# Patient Record
Sex: Female | Born: 1965 | Race: Black or African American | Hispanic: No | Marital: Single | Smoking: Former smoker
Health system: Southern US, Community
[De-identification: ages and names within clinical notes are randomized; demographics above are authoritative.]

## PROBLEM LIST (undated history)

## (undated) DIAGNOSIS — M5126 Other intervertebral disc displacement, lumbar region: Secondary | ICD-10-CM

## (undated) DIAGNOSIS — M1990 Unspecified osteoarthritis, unspecified site: Secondary | ICD-10-CM

## (undated) DIAGNOSIS — I10 Essential (primary) hypertension: Secondary | ICD-10-CM

## (undated) DIAGNOSIS — J302 Other seasonal allergic rhinitis: Secondary | ICD-10-CM

## (undated) DIAGNOSIS — G709 Myoneural disorder, unspecified: Secondary | ICD-10-CM

## (undated) DIAGNOSIS — M5136 Other intervertebral disc degeneration, lumbar region: Secondary | ICD-10-CM

## (undated) DIAGNOSIS — J4520 Mild intermittent asthma, uncomplicated: Secondary | ICD-10-CM

## (undated) DIAGNOSIS — R569 Unspecified convulsions: Secondary | ICD-10-CM

## (undated) DIAGNOSIS — D219 Benign neoplasm of connective and other soft tissue, unspecified: Secondary | ICD-10-CM

## (undated) DIAGNOSIS — M51369 Other intervertebral disc degeneration, lumbar region without mention of lumbar back pain or lower extremity pain: Secondary | ICD-10-CM

## (undated) DIAGNOSIS — R232 Flushing: Secondary | ICD-10-CM

## (undated) DIAGNOSIS — N63 Unspecified lump in unspecified breast: Secondary | ICD-10-CM

## (undated) DIAGNOSIS — N926 Irregular menstruation, unspecified: Secondary | ICD-10-CM

## (undated) DIAGNOSIS — N840 Polyp of corpus uteri: Secondary | ICD-10-CM

## (undated) DIAGNOSIS — F32A Depression, unspecified: Secondary | ICD-10-CM

## (undated) DIAGNOSIS — T7840XA Allergy, unspecified, initial encounter: Secondary | ICD-10-CM

## (undated) HISTORY — DX: Mild intermittent asthma, uncomplicated: J45.20

## (undated) HISTORY — DX: Allergy, unspecified, initial encounter: T78.40XA

## (undated) HISTORY — DX: Irregular menstruation, unspecified: N92.6

## (undated) HISTORY — DX: Unspecified lump in unspecified breast: N63.0

## (undated) HISTORY — PX: GANGLION CYST EXCISION: SHX1691

## (undated) HISTORY — PX: TUBAL LIGATION: SHX77

## (undated) HISTORY — DX: Flushing: R23.2

## (undated) HISTORY — PX: BREAST BIOPSY: SHX20

## (undated) HISTORY — PX: TRIGGER FINGER RELEASE: SHX641

## (undated) HISTORY — DX: Myoneural disorder, unspecified: G70.9

## (undated) HISTORY — DX: Polyp of corpus uteri: N84.0

## (undated) HISTORY — DX: Benign neoplasm of connective and other soft tissue, unspecified: D21.9

## (undated) HISTORY — PX: ABLATION: SHX5711

## (undated) HISTORY — DX: Depression, unspecified: F32.A

---

## 2001-01-10 ENCOUNTER — Ambulatory Visit (HOSPITAL_COMMUNITY): Admission: RE | Admit: 2001-01-10 | Discharge: 2001-01-10 | Payer: Self-pay | Admitting: Obstetrics and Gynecology

## 2001-01-22 ENCOUNTER — Other Ambulatory Visit: Admission: RE | Admit: 2001-01-22 | Discharge: 2001-01-22 | Payer: Self-pay | Admitting: Obstetrics and Gynecology

## 2001-02-02 ENCOUNTER — Emergency Department (HOSPITAL_COMMUNITY): Admission: EM | Admit: 2001-02-02 | Discharge: 2001-02-02 | Payer: Self-pay | Admitting: Emergency Medicine

## 2001-03-08 ENCOUNTER — Emergency Department (HOSPITAL_COMMUNITY): Admission: EM | Admit: 2001-03-08 | Discharge: 2001-03-08 | Payer: Self-pay | Admitting: Emergency Medicine

## 2001-06-11 ENCOUNTER — Encounter: Payer: Self-pay | Admitting: Emergency Medicine

## 2001-06-11 ENCOUNTER — Emergency Department (HOSPITAL_COMMUNITY): Admission: EM | Admit: 2001-06-11 | Discharge: 2001-06-11 | Payer: Self-pay | Admitting: Emergency Medicine

## 2001-11-14 ENCOUNTER — Ambulatory Visit (HOSPITAL_COMMUNITY): Admission: AD | Admit: 2001-11-14 | Discharge: 2001-11-14 | Payer: Self-pay | Admitting: Obstetrics and Gynecology

## 2001-11-29 ENCOUNTER — Ambulatory Visit (HOSPITAL_COMMUNITY): Admission: AD | Admit: 2001-11-29 | Discharge: 2001-11-29 | Payer: Self-pay | Admitting: Obstetrics and Gynecology

## 2001-12-10 ENCOUNTER — Ambulatory Visit (HOSPITAL_COMMUNITY): Admission: AD | Admit: 2001-12-10 | Discharge: 2001-12-10 | Payer: Self-pay | Admitting: Obstetrics and Gynecology

## 2001-12-16 ENCOUNTER — Ambulatory Visit (HOSPITAL_COMMUNITY): Admission: AD | Admit: 2001-12-16 | Discharge: 2001-12-16 | Payer: Self-pay | Admitting: Obstetrics and Gynecology

## 2001-12-19 ENCOUNTER — Ambulatory Visit (HOSPITAL_COMMUNITY): Admission: AD | Admit: 2001-12-19 | Discharge: 2001-12-19 | Payer: Self-pay | Admitting: Obstetrics and Gynecology

## 2001-12-23 ENCOUNTER — Ambulatory Visit (HOSPITAL_COMMUNITY): Admission: RE | Admit: 2001-12-23 | Discharge: 2001-12-23 | Payer: Self-pay | Admitting: Obstetrics and Gynecology

## 2001-12-26 ENCOUNTER — Ambulatory Visit (HOSPITAL_COMMUNITY): Admission: RE | Admit: 2001-12-26 | Discharge: 2001-12-26 | Payer: Self-pay | Admitting: Obstetrics and Gynecology

## 2001-12-30 ENCOUNTER — Ambulatory Visit (HOSPITAL_COMMUNITY): Admission: AD | Admit: 2001-12-30 | Discharge: 2001-12-30 | Payer: Self-pay | Admitting: Obstetrics and Gynecology

## 2002-01-02 ENCOUNTER — Ambulatory Visit (HOSPITAL_COMMUNITY): Admission: AD | Admit: 2002-01-02 | Discharge: 2002-01-02 | Payer: Self-pay | Admitting: Obstetrics and Gynecology

## 2002-01-06 ENCOUNTER — Ambulatory Visit (HOSPITAL_COMMUNITY): Admission: AD | Admit: 2002-01-06 | Discharge: 2002-01-06 | Payer: Self-pay | Admitting: Obstetrics and Gynecology

## 2002-01-09 ENCOUNTER — Ambulatory Visit (HOSPITAL_COMMUNITY): Admission: RE | Admit: 2002-01-09 | Discharge: 2002-01-09 | Payer: Self-pay | Admitting: Obstetrics and Gynecology

## 2002-01-10 ENCOUNTER — Inpatient Hospital Stay (HOSPITAL_COMMUNITY): Admission: RE | Admit: 2002-01-10 | Discharge: 2002-01-13 | Payer: Self-pay | Admitting: Obstetrics and Gynecology

## 2003-09-07 ENCOUNTER — Ambulatory Visit (HOSPITAL_COMMUNITY): Admission: RE | Admit: 2003-09-07 | Discharge: 2003-09-07 | Payer: Self-pay | Admitting: Family Medicine

## 2004-02-02 ENCOUNTER — Emergency Department (HOSPITAL_COMMUNITY): Admission: EM | Admit: 2004-02-02 | Discharge: 2004-02-02 | Payer: Self-pay | Admitting: Emergency Medicine

## 2004-06-19 ENCOUNTER — Emergency Department (HOSPITAL_COMMUNITY): Admission: EM | Admit: 2004-06-19 | Discharge: 2004-06-19 | Payer: Self-pay | Admitting: Emergency Medicine

## 2005-01-31 ENCOUNTER — Emergency Department (HOSPITAL_COMMUNITY): Admission: EM | Admit: 2005-01-31 | Discharge: 2005-01-31 | Payer: Self-pay | Admitting: Emergency Medicine

## 2005-02-11 ENCOUNTER — Emergency Department (HOSPITAL_COMMUNITY): Admission: EM | Admit: 2005-02-11 | Discharge: 2005-02-11 | Payer: Self-pay | Admitting: Emergency Medicine

## 2005-09-30 ENCOUNTER — Emergency Department (HOSPITAL_COMMUNITY): Admission: EM | Admit: 2005-09-30 | Discharge: 2005-09-30 | Payer: Self-pay | Admitting: Emergency Medicine

## 2005-10-05 ENCOUNTER — Ambulatory Visit (HOSPITAL_COMMUNITY): Admission: RE | Admit: 2005-10-05 | Discharge: 2005-10-05 | Payer: Self-pay | Admitting: Family Medicine

## 2006-01-04 ENCOUNTER — Emergency Department (HOSPITAL_COMMUNITY): Admission: EM | Admit: 2006-01-04 | Discharge: 2006-01-04 | Payer: Self-pay | Admitting: Emergency Medicine

## 2006-02-15 ENCOUNTER — Emergency Department (HOSPITAL_COMMUNITY): Admission: EM | Admit: 2006-02-15 | Discharge: 2006-02-15 | Payer: Self-pay | Admitting: Emergency Medicine

## 2006-02-19 ENCOUNTER — Ambulatory Visit (HOSPITAL_COMMUNITY): Admission: RE | Admit: 2006-02-19 | Discharge: 2006-02-19 | Payer: Self-pay | Admitting: Internal Medicine

## 2006-08-16 ENCOUNTER — Emergency Department (HOSPITAL_COMMUNITY): Admission: EM | Admit: 2006-08-16 | Discharge: 2006-08-16 | Payer: Self-pay | Admitting: Emergency Medicine

## 2007-02-07 ENCOUNTER — Emergency Department (HOSPITAL_COMMUNITY): Admission: EM | Admit: 2007-02-07 | Discharge: 2007-02-07 | Payer: Self-pay | Admitting: Emergency Medicine

## 2007-02-25 ENCOUNTER — Emergency Department (HOSPITAL_COMMUNITY): Admission: EM | Admit: 2007-02-25 | Discharge: 2007-02-25 | Payer: Self-pay | Admitting: Emergency Medicine

## 2007-03-27 ENCOUNTER — Emergency Department (HOSPITAL_COMMUNITY): Admission: EM | Admit: 2007-03-27 | Discharge: 2007-03-27 | Payer: Self-pay | Admitting: Emergency Medicine

## 2007-05-29 ENCOUNTER — Ambulatory Visit (HOSPITAL_COMMUNITY): Admission: RE | Admit: 2007-05-29 | Discharge: 2007-05-29 | Payer: Self-pay | Admitting: Internal Medicine

## 2007-06-11 ENCOUNTER — Emergency Department (HOSPITAL_COMMUNITY): Admission: EM | Admit: 2007-06-11 | Discharge: 2007-06-11 | Payer: Self-pay | Admitting: Emergency Medicine

## 2007-07-02 ENCOUNTER — Encounter (HOSPITAL_COMMUNITY): Admission: RE | Admit: 2007-07-02 | Discharge: 2007-08-01 | Payer: Self-pay | Admitting: Orthopaedic Surgery

## 2007-08-02 ENCOUNTER — Encounter (HOSPITAL_COMMUNITY): Admission: RE | Admit: 2007-08-02 | Discharge: 2007-09-01 | Payer: Self-pay | Admitting: Orthopaedic Surgery

## 2007-09-02 ENCOUNTER — Emergency Department (HOSPITAL_COMMUNITY): Admission: EM | Admit: 2007-09-02 | Discharge: 2007-09-02 | Payer: Self-pay | Admitting: Emergency Medicine

## 2007-09-25 ENCOUNTER — Emergency Department (HOSPITAL_COMMUNITY): Admission: EM | Admit: 2007-09-25 | Discharge: 2007-09-25 | Payer: Self-pay | Admitting: Emergency Medicine

## 2007-10-07 ENCOUNTER — Ambulatory Visit (HOSPITAL_COMMUNITY): Admission: RE | Admit: 2007-10-07 | Discharge: 2007-10-07 | Payer: Self-pay | Admitting: Internal Medicine

## 2008-01-16 ENCOUNTER — Emergency Department (HOSPITAL_COMMUNITY): Admission: EM | Admit: 2008-01-16 | Discharge: 2008-01-16 | Payer: Self-pay | Admitting: Emergency Medicine

## 2008-01-21 ENCOUNTER — Encounter (HOSPITAL_COMMUNITY): Admission: RE | Admit: 2008-01-21 | Discharge: 2008-02-07 | Payer: Self-pay | Admitting: Orthopaedic Surgery

## 2008-04-13 ENCOUNTER — Ambulatory Visit (HOSPITAL_COMMUNITY): Admission: RE | Admit: 2008-04-13 | Discharge: 2008-04-13 | Payer: Self-pay | Admitting: Internal Medicine

## 2008-10-20 ENCOUNTER — Ambulatory Visit (HOSPITAL_COMMUNITY): Admission: RE | Admit: 2008-10-20 | Discharge: 2008-10-20 | Payer: Self-pay | Admitting: Internal Medicine

## 2008-10-29 ENCOUNTER — Ambulatory Visit (HOSPITAL_COMMUNITY): Admission: RE | Admit: 2008-10-29 | Discharge: 2008-10-29 | Payer: Self-pay | Admitting: Internal Medicine

## 2008-12-30 ENCOUNTER — Emergency Department (HOSPITAL_COMMUNITY): Admission: EM | Admit: 2008-12-30 | Discharge: 2008-12-30 | Payer: Self-pay | Admitting: Emergency Medicine

## 2009-02-03 ENCOUNTER — Emergency Department (HOSPITAL_COMMUNITY): Admission: EM | Admit: 2009-02-03 | Discharge: 2009-02-03 | Payer: Self-pay | Admitting: Emergency Medicine

## 2009-03-18 ENCOUNTER — Ambulatory Visit (HOSPITAL_COMMUNITY): Admission: RE | Admit: 2009-03-18 | Discharge: 2009-03-18 | Payer: Self-pay | Admitting: Neurology

## 2009-10-06 ENCOUNTER — Ambulatory Visit (HOSPITAL_COMMUNITY): Admission: RE | Admit: 2009-10-06 | Discharge: 2009-10-06 | Payer: Self-pay | Admitting: Internal Medicine

## 2010-01-24 ENCOUNTER — Other Ambulatory Visit: Admission: RE | Admit: 2010-01-24 | Discharge: 2010-01-24 | Payer: Self-pay | Admitting: Orthopaedic Surgery

## 2010-06-30 ENCOUNTER — Emergency Department (HOSPITAL_COMMUNITY)
Admission: EM | Admit: 2010-06-30 | Discharge: 2010-06-30 | Payer: Self-pay | Source: Home / Self Care | Admitting: Emergency Medicine

## 2010-07-03 ENCOUNTER — Encounter: Payer: Self-pay | Admitting: Internal Medicine

## 2010-07-03 ENCOUNTER — Encounter: Payer: Self-pay | Admitting: Family Medicine

## 2010-07-29 ENCOUNTER — Emergency Department (HOSPITAL_COMMUNITY)
Admission: EM | Admit: 2010-07-29 | Discharge: 2010-07-29 | Disposition: A | Discharge status: Home / Self Care | Payer: Medicaid Other | Attending: Emergency Medicine | Admitting: Emergency Medicine

## 2010-07-29 DIAGNOSIS — I10 Essential (primary) hypertension: Secondary | ICD-10-CM | POA: Insufficient documentation

## 2010-07-29 DIAGNOSIS — Z79899 Other long term (current) drug therapy: Secondary | ICD-10-CM | POA: Insufficient documentation

## 2010-07-29 LAB — CBC
HCT: 37.6 % (ref 36.0–46.0)
Hemoglobin: 13.3 g/dL (ref 12.0–15.0)
MCH: 31.6 pg (ref 26.0–34.0)
MCHC: 35.4 g/dL (ref 30.0–36.0)
MCV: 89.3 fL (ref 78.0–100.0)
Platelets: 253 10*3/uL (ref 150–400)
RBC: 4.21 MIL/uL (ref 3.87–5.11)
RDW: 13.4 % (ref 11.5–15.5)
WBC: 6 10*3/uL (ref 4.0–10.5)

## 2010-07-29 LAB — URINALYSIS, ROUTINE W REFLEX MICROSCOPIC
Bilirubin Urine: NEGATIVE
Ketones, ur: NEGATIVE mg/dL
Leukocytes, UA: NEGATIVE
Nitrite: NEGATIVE
Specific Gravity, Urine: 1.02 (ref 1.005–1.030)
Urine Glucose, Fasting: NEGATIVE mg/dL
Urobilinogen, UA: 1 mg/dL (ref 0.0–1.0)
pH: 6 (ref 5.0–8.0)

## 2010-07-29 LAB — URINE MICROSCOPIC-ADD ON

## 2010-07-29 LAB — BASIC METABOLIC PANEL
BUN: 9 mg/dL (ref 6–23)
CO2: 22 mEq/L (ref 19–32)
Calcium: 9.1 mg/dL (ref 8.4–10.5)
Chloride: 110 mEq/L (ref 96–112)
Creatinine, Ser: 0.84 mg/dL (ref 0.4–1.2)
GFR calc Af Amer: >60 mL/min (ref >60)
GFR calc non Af Amer: >60 mL/min (ref >60)
Glucose, Bld: 102 mg/dL — ABNORMAL HIGH (ref 70–99)
Potassium: 3.7 mEq/L (ref 3.5–5.1)
Sodium: 139 mEq/L (ref 135–145)

## 2010-07-29 LAB — WET PREP, GENITAL
Trich, Wet Prep: NONE SEEN
WBC, Wet Prep HPF POC: NONE SEEN
Yeast Wet Prep HPF POC: NONE SEEN

## 2010-07-29 LAB — DIFFERENTIAL
Basophils Absolute: 0 10*3/uL (ref 0.0–0.1)
Basophils Relative: 0 % (ref 0–1)
Eosinophils Absolute: 0.2 10*3/uL (ref 0.0–0.7)
Eosinophils Relative: 4 % (ref 0–5)
Lymphocytes Relative: 34 % (ref 12–46)
Lymphs Abs: 2 10*3/uL (ref 0.7–4.0)
Monocytes Absolute: 0.4 10*3/uL (ref 0.1–1.0)
Monocytes Relative: 6 % (ref 3–12)
Neutro Abs: 3.4 10*3/uL (ref 1.7–7.7)
Neutrophils Relative %: 57 % (ref 43–77)

## 2010-07-29 LAB — POCT PREGNANCY, URINE: Preg Test, Ur: NEGATIVE

## 2010-07-30 LAB — GC/CHLAMYDIA PROBE AMP, GENITAL
Chlamydia, DNA Probe: NEGATIVE
GC Probe Amp, Genital: NEGATIVE

## 2010-07-31 ENCOUNTER — Emergency Department (HOSPITAL_COMMUNITY)
Admission: EM | Admit: 2010-07-31 | Discharge: 2010-07-31 | Disposition: A | Discharge status: Home / Self Care | Payer: Medicaid Other | Attending: Emergency Medicine | Admitting: Emergency Medicine

## 2010-07-31 DIAGNOSIS — N949 Unspecified condition associated with female genital organs and menstrual cycle: Secondary | ICD-10-CM | POA: Insufficient documentation

## 2010-07-31 DIAGNOSIS — I10 Essential (primary) hypertension: Secondary | ICD-10-CM | POA: Insufficient documentation

## 2010-07-31 DIAGNOSIS — R109 Unspecified abdominal pain: Secondary | ICD-10-CM | POA: Insufficient documentation

## 2010-07-31 DIAGNOSIS — N938 Other specified abnormal uterine and vaginal bleeding: Secondary | ICD-10-CM | POA: Insufficient documentation

## 2010-07-31 LAB — CBC
HCT: 39.6 % (ref 36.0–46.0)
Hemoglobin: 13.5 g/dL (ref 12.0–15.0)
MCH: 32.3 pg (ref 26.0–34.0)
MCHC: 34.1 g/dL (ref 30.0–36.0)
MCV: 94.7 fL (ref 78.0–100.0)
Platelets: 269 10*3/uL (ref 150–400)
RBC: 4.18 MIL/uL (ref 3.87–5.11)
RDW: 14 % (ref 11.5–15.5)
WBC: 6.3 10*3/uL (ref 4.0–10.5)

## 2010-07-31 LAB — POCT PREGNANCY, URINE: Preg Test, Ur: NEGATIVE

## 2010-08-17 ENCOUNTER — Other Ambulatory Visit (HOSPITAL_COMMUNITY)
Admission: RE | Admit: 2010-08-17 | Discharge: 2010-08-17 | Disposition: A | Discharge status: Home / Self Care | Payer: Medicaid Other | Source: Ambulatory Visit | Attending: Obstetrics and Gynecology | Admitting: Obstetrics and Gynecology

## 2010-08-17 ENCOUNTER — Other Ambulatory Visit: Payer: Self-pay | Admitting: Obstetrics and Gynecology

## 2010-08-17 DIAGNOSIS — Z01419 Encounter for gynecological examination (general) (routine) without abnormal findings: Secondary | ICD-10-CM | POA: Insufficient documentation

## 2010-08-17 DIAGNOSIS — Z113 Encounter for screening for infections with a predominantly sexual mode of transmission: Secondary | ICD-10-CM | POA: Insufficient documentation

## 2010-08-31 ENCOUNTER — Other Ambulatory Visit (HOSPITAL_COMMUNITY): Payer: Self-pay | Admitting: Internal Medicine

## 2010-08-31 DIAGNOSIS — Z139 Encounter for screening, unspecified: Secondary | ICD-10-CM

## 2010-09-12 ENCOUNTER — Emergency Department (HOSPITAL_COMMUNITY): Payer: Medicaid Other

## 2010-09-12 ENCOUNTER — Emergency Department (HOSPITAL_COMMUNITY)
Admission: EM | Admit: 2010-09-12 | Discharge: 2010-09-12 | Disposition: A | Discharge status: Home / Self Care | Payer: Medicaid Other | Attending: Emergency Medicine | Admitting: Emergency Medicine

## 2010-09-12 DIAGNOSIS — I10 Essential (primary) hypertension: Secondary | ICD-10-CM | POA: Insufficient documentation

## 2010-09-12 DIAGNOSIS — Y92009 Unspecified place in unspecified non-institutional (private) residence as the place of occurrence of the external cause: Secondary | ICD-10-CM | POA: Insufficient documentation

## 2010-09-12 DIAGNOSIS — R51 Headache: Secondary | ICD-10-CM | POA: Insufficient documentation

## 2010-09-12 DIAGNOSIS — S060XAA Concussion with loss of consciousness status unknown, initial encounter: Secondary | ICD-10-CM | POA: Insufficient documentation

## 2010-09-12 DIAGNOSIS — W19XXXA Unspecified fall, initial encounter: Secondary | ICD-10-CM | POA: Insufficient documentation

## 2010-09-12 DIAGNOSIS — M542 Cervicalgia: Secondary | ICD-10-CM | POA: Insufficient documentation

## 2010-09-12 DIAGNOSIS — S060X9A Concussion with loss of consciousness of unspecified duration, initial encounter: Secondary | ICD-10-CM | POA: Insufficient documentation

## 2010-09-12 LAB — BASIC METABOLIC PANEL WITH GFR
BUN: 15 mg/dL (ref 6–23)
CO2: 26 meq/L (ref 19–32)
Calcium: 9.1 mg/dL (ref 8.4–10.5)
Chloride: 105 meq/L (ref 96–112)
Creatinine, Ser: 0.88 mg/dL (ref 0.4–1.2)
GFR calc non Af Amer: >60 mL/min
Glucose, Bld: 107 mg/dL — ABNORMAL HIGH (ref 70–99)
Potassium: 3.3 meq/L — ABNORMAL LOW (ref 3.5–5.1)
Sodium: 138 meq/L (ref 135–145)

## 2010-09-12 LAB — DIFFERENTIAL
Basophils Absolute: 0 10*3/uL (ref 0.0–0.1)
Basophils Relative: 0 % (ref 0–1)
Eosinophils Absolute: 0.3 10*3/uL (ref 0.0–0.7)
Eosinophils Relative: 5 % (ref 0–5)
Lymphocytes Relative: 44 % (ref 12–46)
Lymphs Abs: 2.4 10*3/uL (ref 0.7–4.0)
Monocytes Absolute: 0.3 10*3/uL (ref 0.1–1.0)
Monocytes Relative: 6 % (ref 3–12)
Neutro Abs: 2.4 10*3/uL (ref 1.7–7.7)
Neutrophils Relative %: 45 % (ref 43–77)

## 2010-09-12 LAB — CBC
HCT: 40.2 % (ref 36.0–46.0)
Hemoglobin: 13.9 g/dL (ref 12.0–15.0)
MCH: 32.3 pg (ref 26.0–34.0)
MCHC: 34.6 g/dL (ref 30.0–36.0)
MCV: 93.5 fL (ref 78.0–100.0)
Platelets: 277 K/uL (ref 150–400)
RBC: 4.3 MIL/uL (ref 3.87–5.11)
RDW: 13.4 % (ref 11.5–15.5)
WBC: 5.4 K/uL (ref 4.0–10.5)

## 2010-09-12 LAB — POCT PREGNANCY, URINE: Preg Test, Ur: NEGATIVE

## 2010-09-21 ENCOUNTER — Other Ambulatory Visit: Payer: Self-pay | Admitting: Neurology

## 2010-09-21 DIAGNOSIS — G902 Horner's syndrome: Secondary | ICD-10-CM

## 2010-09-27 ENCOUNTER — Ambulatory Visit (HOSPITAL_COMMUNITY)
Admission: RE | Admit: 2010-09-27 | Discharge: 2010-09-27 | Disposition: A | Discharge status: Home / Self Care | Payer: Medicaid Other | Source: Ambulatory Visit | Attending: Neurology | Admitting: Neurology

## 2010-09-27 ENCOUNTER — Ambulatory Visit (HOSPITAL_COMMUNITY): Payer: Medicaid Other

## 2010-09-27 DIAGNOSIS — G902 Horner's syndrome: Secondary | ICD-10-CM

## 2010-09-27 DIAGNOSIS — S0990XA Unspecified injury of head, initial encounter: Secondary | ICD-10-CM | POA: Insufficient documentation

## 2010-09-27 DIAGNOSIS — G909 Disorder of the autonomic nervous system, unspecified: Secondary | ICD-10-CM | POA: Insufficient documentation

## 2010-09-27 DIAGNOSIS — X58XXXA Exposure to other specified factors, initial encounter: Secondary | ICD-10-CM | POA: Insufficient documentation

## 2010-10-10 ENCOUNTER — Ambulatory Visit (HOSPITAL_COMMUNITY)
Admission: RE | Admit: 2010-10-10 | Discharge: 2010-10-10 | Disposition: A | Discharge status: Home / Self Care | Payer: Medicaid Other | Source: Ambulatory Visit | Attending: Internal Medicine | Admitting: Internal Medicine

## 2010-10-10 DIAGNOSIS — Z139 Encounter for screening, unspecified: Secondary | ICD-10-CM

## 2010-10-10 DIAGNOSIS — Z1231 Encounter for screening mammogram for malignant neoplasm of breast: Secondary | ICD-10-CM | POA: Insufficient documentation

## 2010-10-28 NOTE — H&P (Signed)
[facility]  Patient:    Judy, Cross Visit Number: [id number] MRN: [id number]          Service Type: OBS Location: 4A A414 01 Attending Physician:  Tilda Burrow Dictated by:   Christin Bach, M.D. Admit Date:  01/02/2002 Discharge Date: 01/02/2002   CC:         Heidi Dach, M.D.   History and Physical  Pregnancy 38 weeks 6 days, prior cesarean section, now for trial of labor with elective permanent sterilization.  History of substance abuse THC and cocaine, apparently negative for several weeks.  HISTORY OF PRESENT ILLNESS:  This 45 year old female, gravida 4, para 2, AB 1, LMP April 26, 2002, placing [facility] August 22 with ultrasound corrected [facility] of August 9, placed on 13-week ultrasound and confirmed at 19 weeks. She is admitted at 38 weeks 6 days for ultrasound criteria.  Prenatal course has been notable for positive urine drug screens on multliple occasions with the patient confronted finally with enough family support that she has been negative since June.  The patient understands the permanency of requested permanent sterilization, and will proceed with cesarean section on January 10, 2002.  Failure rates of 1/100 to 1/200 quoted to the patient.  PAST MEDICAL HISTORY:  Benign.  PAST SURGICAL HISTORY:  D&C 2002, C section 1989 and 1997.  PHYSICAL EXAMINATION:  VITAL SIGNS:  Height 5 feet 4 inches, weight 199.  Blood pressure 120/70, pulse 90s.  GENERAL:   She is a healthy-appearing African-American female, alert and oriented.  HEENT:  Pupils are equal, round and reactive.  Extraocular movements intact.  NECK:  Supple.  ABDOMEN:  A 40 cm fundal height.  Estimated fetal weight 8 pounds or more.  PRENATAL LABORATORY DATA:  Blood type O positive, rubella immune.  Hepatitis, HIV, GC, chlamydia, and RPR are negative.  Downs syndrome risk 1:261.  Group B strep not done.  PLAN:  Repeat C section, tubal ligation with wide  excision of cicatrix on August 1. Dictated by:   Christin Bach, M.D. Attending Physician:  Tilda Burrow DD:  01/06/02 TD:  01/06/02 Job: [id number]

## 2010-10-28 NOTE — Discharge Summary (Signed)
NAME:  Judy Cross, Judy Cross                        ACCOUNT NO.:  [id number]   MEDICAL RECORD NO.:  [id number]                   PATIENT TYPE:   LOCATION:                                       FACILITY:   PHYSICIAN:  Tilda Burrow, M.D.              DATE OF BIRTH:   DATE OF ADMISSION:  01/10/2002  DATE OF DISCHARGE:  01/13/2002                                 DISCHARGE SUMMARY   ADMISSION DIAGNOSES:  1. Pregnancy 38 weeks 6 days.  2. Prior cesarean section not for trial of labor.  3. Elective permanent sterilization.  4. History of substance abuse.   DISCHARGE DIAGNOSES:  1. Pregnancy at 38 weeks 6 days delivered.  2. Repeat cesarean section not for trial of labor.  3. Elective permanent sterilization.   PROCEDURES:  On January 10, 2002, repeat low transverse cervical cesarean  section and bilateral partial salpingectomy wanted excision of cicatrix.   DISCHARGE MEDICATIONS:  1. Tylox one q.4h. p.r.n. pain.  2. Prenatal vitamins, iron one p.o. q.d.   FOLLOW UP:  January 17, 2002, staple removal.   HISTORY OF PRESENT ILLNESS:  The patient is a 45 year old female gravida 4,  para 2, AB 1, two prior cesareans was admitted at 38 weeks 6 days for  cesarean section.  The patient requested permanent sterilization as part of  the procedure.   PAST MEDICAL HISTORY:  Benign, other than substance abuse.   PAST SURGICAL HISTORY:  D&C 2002, C-section 1989 and 1997.   PHYSICAL EXAMINATION:  VITALS:  Height 5 feet 4 inches, weight 199, blood  pressure 120/70.   PERTINENT GYNECOLOGICAL EXAMINATION:  ABDOMEN:  40 cm fundal height,  estimated fetal weight 8 pounds; blood type O positive.   HOSPITAL COURSE:  The patient underwent repeat low transverse cervical  cesarean section on January 10, 2002 with estimated blood loss of 600 cc.  The  patient had an admitting hemoglobin of 11.4, hematocrit 32.3.  On  postoperative day #1, she had a hemoglobin of 9.9, hematocrit 28.6, white  count was  10,700.  She remained afebrile.  Maternal blood type confirmed as  O positive.  She tolerated a regular diet and had a JP drain that had been  placed in  the old scar removal area kept for 48 hours then removed.  Evaluation on  January 13, 2002 showed an excellent smooth incision without erythema or  swelling.  The patient was discharged home breast-feeding with bottle  supplementation for followup January 17, 2002 staple and then 4 weeks final  postpartum check.                                               Tilda Burrow, M.D.    JVF/MEDQ  D:  01/13/2002  T:  01/17/2002  Job:  [id number]  

## 2010-10-28 NOTE — H&P (Signed)
[facility]  Patient:    Judy Cross, Judy Cross Visit Number: [id number] MRN: [id number]          Service Type: OBS Location: 4A A415 01 Attending Physician:  Tilda Burrow Dictated by:   Christin Bach, M.D. Admit Date:  11/14/2001 Discharge Date: 11/14/2001                           History and Physical  CHIEF COMPLAINT:  Urinary frequency, rule out diarrhea, rule out preterm labor.  DISCHARGE DIAGNOSES: 1. Gastroenteritis, resolving. 2. Hypokalemia. 3. Cocaine use, recurrent.  HISTORY OF PRESENT ILLNESS:  This 45 year old female, gravida 4, para 2, AB 1, LMP April 26, 2001,  ______ January 31, 2001, with ultrasound corrected [facility] of January 18, 2001, based on 13 week ultrasound and confirmed at 19 weeks. She has her pregnancy course followed through our office and notable for cocaine use, identified at initial prenatal care.  The patient originally was going to cooperative with avoidance.  During March, she agreed to a program of q.2 week urine drug screens and complied with mental health care visits and maternal child care visits.  She remained clean through most of March and April but at this time has, during late April, acknowledged considering use but was still reportedly negative on urine drug screen at the time.  Unfortunately over the last 2 visits were without urine drug screen. She states on todays visit that she used approximately 1 week ago, when confronted with a positive urine drug screen done upon initial assessment here in labor and delivery.  Urinalysis otherwise unremarkable, with no evidence of urinary tract infection or other problems.  She is positive only for cocaine.  Direct conversations had the patient recommended that intervention be performed.  The patient states that she is ready and willing to consider rehabilitation program.  She has been to a brief program in the past when she was not pregnant.  The patient has been  keeping the presence of her drug use as a secret from her significant other, Pattricia Boss, who works 3rd shift.  ]  PAST MEDICAL HISTORY:  Benign except for drug use of cocaine at present and marijuana in the past.  PAST SURGICAL HISTORY:  Dilatation and curettage for miscarriage, June 2002,  ALLERGIES:  No known drug allergies.  SOCIAL HISTORY:  Single with supportive father to the current pregnancy.  FAMILY HISTORY:  Positive for Cherlynn Polo disease in the patients father. She is planning tubal ligation.  PHYSICAL EXAMINATION:  VITAL SIGNS:  Height 5 feet 2 inches.  Weight 186 pounds.  Temperature 97.6, pulse 83, respirations 20, blood pressure 114/69.  Urinalysis catheterization specimen is negative except for the positive urine drug screen.  FETAL HEART RATE:  135 and a reactive criteria is met.  There is no uterine contractions at the present time.  CERVIX:  Not necessary at this time.  OBSERVATION COURSE:  The patient was observed in labor and delivery from 10 a.m. until approximately 4 p.m.  Received 2 liters of IV fluids including 20 mEq of diluted potassium for low potassium of 3.1.  She is discharged home after treatment plan as follows:  FOLLOWUP:  Follow up visit 2 p.m. on November 15, 2001.  Will attempt to arrange inpatient management.  Will contact Behavioral Health at [facility] and have spoken with [facility] maternal fetal medicine and they have given me the number to the [facility] [employer] which is a drug rehabilitation program with phone number [phone]). Dictated by:   Christin Bach, M.D. Attending Physician:  Tilda Burrow DD:  11/14/01 TD:  11/14/01 Job: [id number]

## 2010-10-28 NOTE — Op Note (Signed)
NAME:  Judy Cross, Judy Cross                         ACCOUNT NO.:  [id number]   MEDICAL RECORD NO.:  [id number]                   PATIENT TYPE:  INP   LOCATION:  A418                                 FACILITY:  [facility]   PHYSICIAN:  Tilda Burrow, M.D.              DATE OF BIRTH:  1966-03-27   DATE OF PROCEDURE:  01/10/2002  DATE OF DISCHARGE:  01/13/2002                                 OPERATIVE REPORT   PREOPERATIVE DIAGNOSES:  1. Pregnancy 39 weeks.  2. Repeat cesarean section.  3. Elective sterilization.   POSTOPERATIVE DIAGNOSES:  1. Pregnancy 39 weeks.  2. Repeat cesarean section.  3. Elective sterilization.   PROCEDURE:  1. Repeat low transverse cervical cesarean section.  2. Bilateral partial salpingectomy.  3. Wide excision of cicatrix.   SURGEON:  Tilda Burrow, M.D.   ASSISTANT:  Dr. Karna Dupes, Pediatrician   PEDIATRICIAN:  Gardiner Barefoot, M.D.   ANESTHESIA:  Failed spinal converted to general   COMPLICATIONS:  None.   ESTIMATED BLOOD LOSS:  600 cc.   FINDINGS:  A healthy infant, Apgars 8 and 9.   DESCRIPTION OF PROCEDURE:  The patient was taken to the operating room and  prepped and draped with spinal anesthesia introduced.  After adequate time  we found the relief inadequate to allow cesarean so general anesthesia was  introduced.  The old Pfannenstiel incision was then entered going in 5 cm  above the old cicatrix, and entering the fascia in standard fashion for the  Pfannenstiel-type incision.   The bladder flap was developed on the lower uterine segment and the infant  delivered through a transverse uterine incision that was extended laterally  using index finger traction.  Fetal vertex was guided through the incision  with fundal pressure and then axillary traction used to guide the body  through with additional fundal pressure.  The cord was clamped and cut and  the infant passed to the waiting pediatrician, Dr. Latrelle Dodrill.   Then the placenta delivered  after cord blood samples obtained.  Uterine  irrigation with antibiotic continuous solution was followed by a single  layer of running locking closure of the uterus and then 2-0 chromic used to  close the bladder flap.   TUBAL LIGATION:  Tubal ligation was then performed by identifying each tube  at its midportion, confirming it to come from a tubal presence until the  fimbriated end, then doubly ligating around the mid segment knuckle of the  tube with excision of the specimen.  This was performed bilaterally.   Closure fo the peritoneum was performed using 2-0 chromic with #0 Vicryl  closing the fascia and then subcutaneous tissues were readdressed.  We then  completed excision of the old cicatrix by excising beneath the old cicatrix  with a total size of the specimen approximately 30 cm in length.  This  specimen was discarded.  The subcutaneous tissues were irrigated with  antibiotic solution, cauterized as necessary with wet cautery to achieve  hemostasis and then reapproximated with interrupted 2-0 chromic with a JP  drain placed in the subcu space.  The staple closure of the skin completed  the procedure with estimated blood loss of 600 cc.                                               Tilda Burrow, M.D.    JVF/MEDQ  D:  02/15/2002  T:  02/15/2002  Job:  [id number]

## 2011-01-19 ENCOUNTER — Emergency Department (HOSPITAL_COMMUNITY)
Admission: EM | Admit: 2011-01-19 | Discharge: 2011-01-19 | Disposition: A | Discharge status: Home / Self Care | Payer: Medicaid Other | Attending: Emergency Medicine | Admitting: Emergency Medicine

## 2011-01-19 ENCOUNTER — Encounter: Payer: Self-pay | Admitting: *Deleted

## 2011-01-19 DIAGNOSIS — R51 Headache: Secondary | ICD-10-CM | POA: Insufficient documentation

## 2011-01-19 DIAGNOSIS — I10 Essential (primary) hypertension: Secondary | ICD-10-CM | POA: Insufficient documentation

## 2011-01-19 DIAGNOSIS — M545 Low back pain, unspecified: Secondary | ICD-10-CM | POA: Insufficient documentation

## 2011-01-19 DIAGNOSIS — F17200 Nicotine dependence, unspecified, uncomplicated: Secondary | ICD-10-CM | POA: Insufficient documentation

## 2011-01-19 HISTORY — DX: Other intervertebral disc degeneration, lumbar region: M51.36

## 2011-01-19 HISTORY — DX: Other intervertebral disc displacement, lumbar region: M51.26

## 2011-01-19 HISTORY — DX: Essential (primary) hypertension: I10

## 2011-01-19 HISTORY — DX: Other intervertebral disc degeneration, lumbar region without mention of lumbar back pain or lower extremity pain: M51.369

## 2011-01-19 MED ORDER — DIAZEPAM 5 MG PO TABS
5.0000 mg | ORAL_TABLET | Freq: Once | ORAL | Status: AC
  Administered 2011-01-19: 5 mg via ORAL
  Filled 2011-01-19: qty 1

## 2011-01-19 MED ORDER — OXYCODONE-ACETAMINOPHEN 5-325 MG PO TABS
2.0000 | ORAL_TABLET | Freq: Once | ORAL | Status: AC
  Administered 2011-01-19: 2 via ORAL
  Filled 2011-01-19: qty 2

## 2011-01-19 MED ORDER — HYDROCODONE-ACETAMINOPHEN 5-500 MG PO TABS: 1.0000 | ORAL_TABLET | Freq: Four times a day (QID) | ORAL | Status: AC | PRN

## 2011-01-19 NOTE — ED Provider Notes (Signed)
Scribed for Dr. Patria Mane, the patient was seen in room 12. This chart was scribed by Hillery Hunter. This patient's care was started at 8:43.   History   CSN: [id number] Arrival date & time: 01/19/2011  8:25 AM  Chief Complaint  Patient presents with  . Back Pain   Patient is a 45 y.o. female presenting with back pain.  Back Pain  Associated symptoms include headaches (not a new symptom). Pertinent negatives include no fever, no numbness, no dysuria and no weakness.    Patient presents to the ED complaining of constant lower back pain with associated radiation to the left leg described as tingling extending to the ankle. 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 She denies numbness, weakness, nausea, vomiting, diarrhea, dysuria, or urinary or bowel incontinence. Patient reports a history of a "bulged disc" with prior similar pain. She has taken Ibuprofen 800mg  and Soma which usually alleviate her back pain but are not fully improving pain today.   Past Medical History  Diagnosis Date  . 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 Hypertension     History reviewed. No pertinent past surgical history.  Family History  Problem Relation Age of Onset  . Hypertension Mother   . Hypertension Father     History  Substance Use Topics  . Smoking status: Current Everyday Smoker -- 0.5 packs/day  . Smokeless tobacco: Never Used  . Alcohol Use: No    Review of Systems  Constitutional: Negative for fever and chills.  Gastrointestinal: Negative for nausea, vomiting and diarrhea.  Genitourinary: Negative for dysuria and difficulty urinating.  Musculoskeletal: Positive for back pain.  Neurological: Positive for headaches (not a new symptom). Negative for weakness and numbness.       "tingling left leg"  All other systems reviewed and are negative.    Physical Exam  BP  123/94  Pulse 64  Temp(Src) 98.4 F (36.9 C) (Oral)  Resp 16  Ht 5\' 1"  (1.549 m)  Wt 167 lb (75.751 kg)  BMI 31.55 kg/m2  SpO2 100%  LMP 01/14/2011  Physical Exam  Constitutional: She is oriented to person, place, and time. She appears well-developed and well-nourished.  HENT:  Head: Normocephalic.  Eyes: EOM are normal.  Neck: Normal range of motion.  Pulmonary/Chest: Effort normal.  Abdominal: Soft.       No CVA tenderness. No tenderness over cervical, thoracic, or lumbar spine. Tenderness over left sciatic notch.   Musculoskeletal: Normal range of motion.  Neurological: She is alert and oriented to person, place, and time. She has normal strength.       BLE Strength 5/5  In all major muscle groups.   Skin: Skin is warm. No rash noted.  Psychiatric: She has a normal mood and affect.    ED Course  Procedures  OTHER DATA REVIEWED: Nursing notes, vital signs reviewed.    DIAGNOSTIC STUDIES: Oxygen Saturation is 100% on room air, normal by my interpretation.     MDM: Differential Diagnosis: exacerbation of chronic back pain  Normal lower extremity neurologic exam. No bowel or bladder complaints. No back pain red flags. Likely musculoskeletal back pain/sciatica. Doubt spinal epidural abscess. Doubt cauda equina. Doubt abdominal aortic aneurysm    IMPRESSION: Diagnoses that have been ruled out:  Diagnoses that are still under consideration:  Final diagnoses:  Lumbar back pain     PLAN:  Discharge home with narcotic analgesics The patient is to return the emergency department if there is any worsening of symptoms. I have reviewed the discharge instructions with the patient.   CONDITION ON DISCHARGE: Good, Improved   MEDICATIONS GIVEN IN THE E.D. Medications  oxyCODONE-acetaminophen (PERCOCET) 5-325 MG per tablet 2 tablet (2 tablet Oral Given 01/19/11 0942)  diazepam (VALIUM) tablet 5 mg (5 mg Oral Given 01/19/11 0941)     DISCHARGE MEDICATIONS: New  Prescriptions   HYDROCODONE-ACETAMINOPHEN (VICODIN) 5-500 MG PER TABLET    Take 1-2 tablets by mouth every 6 (six) hours as needed for pain.    Scribe Attestation I personally performed the services described in this documentation, which was scribed in my presence. The recorded information has been reviewed and considered. Isaac Dubie Carolyne Littles, MD 01/19/11 2213

## 2011-01-19 NOTE — ED Notes (Signed)
Pt c/o lower back pain that began this past Monday. Pt states she has been dx with bulging discs in her back.

## 2011-04-27 ENCOUNTER — Other Ambulatory Visit (HOSPITAL_COMMUNITY): Payer: Self-pay | Admitting: "Endocrinology

## 2011-04-28 ENCOUNTER — Ambulatory Visit (HOSPITAL_COMMUNITY)
Admission: RE | Admit: 2011-04-28 | Discharge: 2011-04-28 | Disposition: A | Discharge status: Home / Self Care | Payer: Medicaid Other | Source: Ambulatory Visit | Attending: "Endocrinology | Admitting: "Endocrinology

## 2011-04-28 DIAGNOSIS — R109 Unspecified abdominal pain: Secondary | ICD-10-CM | POA: Insufficient documentation

## 2011-09-05 ENCOUNTER — Other Ambulatory Visit (HOSPITAL_COMMUNITY): Payer: Self-pay | Admitting: Internal Medicine

## 2011-09-05 DIAGNOSIS — Z139 Encounter for screening, unspecified: Secondary | ICD-10-CM

## 2011-10-12 ENCOUNTER — Ambulatory Visit (HOSPITAL_COMMUNITY)
Admission: RE | Admit: 2011-10-12 | Discharge: 2011-10-12 | Disposition: A | Discharge status: Home / Self Care | Payer: Medicaid Other | Source: Ambulatory Visit | Attending: Internal Medicine | Admitting: Internal Medicine

## 2011-10-12 DIAGNOSIS — Z139 Encounter for screening, unspecified: Secondary | ICD-10-CM

## 2011-10-12 DIAGNOSIS — Z1231 Encounter for screening mammogram for malignant neoplasm of breast: Secondary | ICD-10-CM | POA: Insufficient documentation

## 2011-12-14 ENCOUNTER — Emergency Department (HOSPITAL_COMMUNITY)
Admission: EM | Admit: 2011-12-14 | Discharge: 2011-12-14 | Disposition: A | Discharge status: Home / Self Care | Payer: Medicaid Other | Attending: Emergency Medicine | Admitting: Emergency Medicine

## 2011-12-14 ENCOUNTER — Encounter (HOSPITAL_COMMUNITY): Payer: Self-pay | Admitting: *Deleted

## 2011-12-14 DIAGNOSIS — F17200 Nicotine dependence, unspecified, uncomplicated: Secondary | ICD-10-CM | POA: Insufficient documentation

## 2011-12-14 DIAGNOSIS — K648 Other hemorrhoids: Secondary | ICD-10-CM | POA: Insufficient documentation

## 2011-12-14 DIAGNOSIS — I10 Essential (primary) hypertension: Secondary | ICD-10-CM | POA: Insufficient documentation

## 2011-12-14 MED ORDER — LIDOCAINE VISCOUS 2 % MT SOLN
OROMUCOSAL | Status: AC
  Administered 2011-12-14: 15 mL
  Filled 2011-12-14: qty 15

## 2011-12-14 MED ORDER — HYDROCORTISONE ACETATE 25 MG RE SUPP: 25.0000 mg | Freq: Two times a day (BID) | RECTAL | Status: AC

## 2011-12-14 MED ORDER — DOCUSATE SODIUM 100 MG PO CAPS: 100.0000 mg | ORAL_CAPSULE | Freq: Two times a day (BID) | ORAL | Status: AC

## 2011-12-14 NOTE — ED Notes (Addendum)
C/o abscess to rectal area near anus,  pt states that she first noticed it on Monday, denies any drainage, area is painful,

## 2011-12-15 NOTE — ED Provider Notes (Signed)
History     CSN: [id number]  Arrival date & time 12/14/11  0841   First MD Initiated Contact with Patient 12/14/11 [phone]      Chief Complaint  Patient presents with  . Abscess    (Consider location/radiation/quality/duration/timing/severity/associated sxs/prior treatment) HPI Comments: [name] presents with pain with intermittent swelling at her rectum for the past 4 days.  She does have a history of prior episodes and was given a cream to treat the condition, but does know what her diagnosis was at that time.  Her pain has become constant this morning and reports having a bm is painful.  Her last bm was 2 days ago. She does have occasional constipation, but denies feeling like she needs to have a bm.  She denies fevers,  Chills,  Diarrhea and abdominal pain.  The history is provided by the patient.    Past Medical History  Diagnosis Date  . 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 Hypertension     Past Surgical History  Procedure Date  . Cesarean section     Family History  Problem Relation Age of Onset  . Hypertension Mother   . Hypertension Father     History  Substance Use Topics  . Smoking status: Current Everyday Smoker -- 0.5 packs/day  . Smokeless tobacco: Never Used  . Alcohol Use: No    OB History    Grav Para Term Preterm Abortions TAB SAB Ect Mult Living                  Review of Systems  Constitutional: Negative for fever.  HENT: Negative for congestion, sore throat and neck pain.   Eyes: Negative.   Respiratory: Negative for chest tightness and shortness of breath.   Cardiovascular: Negative for chest pain.  Gastrointestinal: Positive for constipation and rectal pain. Negative for nausea, abdominal pain and diarrhea.  Genitourinary: Negative.  Negative for flank pain, vaginal discharge and difficulty urinating.  Musculoskeletal: Negative for joint swelling and arthralgias.  Skin: Negative.  Negative for rash and wound.  Neurological:  Negative for dizziness, weakness, light-headedness, numbness and headaches.  Hematological: Negative.   Psychiatric/Behavioral: Negative.     Allergies  Penicillins  Home Medications   Current Outpatient Rx  Name Route Sig Dispense Refill  . DOCUSATE SODIUM 100 MG PO CAPS Oral Take 1 capsule (100 mg total) by mouth every 12 (twelve) hours. 20 capsule 0  . HYDROCHLOROTHIAZIDE 25 MG PO TABS Oral Take 25 mg by mouth daily.      Marland Kitchen HYDROCORTISONE ACETATE 25 MG RE SUPP Rectal Place 1 suppository (25 mg total) rectally 2 (two) times daily. After warm tub soak for 5 days. 12 suppository 0  . IBUPROFEN 800 MG PO TABS Oral Take 800 mg by mouth every 8 (eight) hours as needed. FOR PAIN     . LEVETIRACETAM 250 MG PO TABS Oral Take 250 mg by mouth every 12 (twelve) hours. FOR SEIZURES     . LISINOPRIL 20 MG PO TABS Oral Take 20 mg by mouth daily.      Marland Kitchen METHOCARBAMOL 500 MG PO TABS Oral Take 500 mg by mouth 4 (four) times daily. FOR SPASMS       BP 133/85  Pulse 80  Temp 98.4 F (36.9 C)  Resp 20  Ht 5\' 1"  (1.549 m)  Wt 157 lb (71.215 kg)  BMI 29.67 kg/m2  SpO2 100%  LMP 12/10/2011  Physical Exam  Nursing note and vitals  reviewed. Constitutional: She appears well-developed and well-nourished.  HENT:  Head: Normocephalic and atraumatic.  Cardiovascular: Normal rate, regular rhythm and normal heart sounds.   Pulmonary/Chest: Effort normal and breath sounds normal.  Abdominal: Soft. Bowel sounds are normal. There is no tenderness.  Genitourinary: Rectal exam shows tenderness.       TTP just inside rectum with no palpable mass or nodule,  No fecal impaction.  No visible fissure.    Musculoskeletal: Normal range of motion.  Neurological: She is alert.  Skin: Skin is warm and dry.  Psychiatric: She has a normal mood and affect.    ED Course  Procedures (including critical care time)  Labs Reviewed - No data to display No results found.   1. Internal hemorrhoid       MDM    Suspect internal hemorrhoid. Pt prescribed anusol suppositories,  Warm sitz baths,  Colace for constipation.          Burgess Amor, [place] 12/15/11 [phone]

## 2011-12-16 ENCOUNTER — Encounter (HOSPITAL_COMMUNITY): Payer: Self-pay | Admitting: Emergency Medicine

## 2011-12-16 ENCOUNTER — Emergency Department (HOSPITAL_COMMUNITY)
Admission: EM | Admit: 2011-12-16 | Discharge: 2011-12-16 | Disposition: A | Discharge status: Home / Self Care | Payer: Medicaid Other | Attending: Emergency Medicine | Admitting: Emergency Medicine

## 2011-12-16 DIAGNOSIS — K649 Unspecified hemorrhoids: Secondary | ICD-10-CM | POA: Insufficient documentation

## 2011-12-16 DIAGNOSIS — I10 Essential (primary) hypertension: Secondary | ICD-10-CM | POA: Insufficient documentation

## 2011-12-16 DIAGNOSIS — F17200 Nicotine dependence, unspecified, uncomplicated: Secondary | ICD-10-CM | POA: Insufficient documentation

## 2011-12-16 MED ORDER — HYDROCORTISONE ACETATE 25 MG RE SUPP: 25.0000 mg | Freq: Two times a day (BID) | RECTAL | Status: DC

## 2011-12-16 NOTE — ED Notes (Signed)
Pt refused suppository bc she stated it was too painful and she did not want to try to insert it. Dr strand was notified and explained that she needed to get that medication even though it was painful to insert. Pt refused and stated she wanted her discharge papers. I advised pt to follow up with family doctor per EDP discharge instructions.

## 2011-12-16 NOTE — ED Provider Notes (Signed)
History     CSN: [id number]  Arrival date & time 12/16/11  0203   First MD Initiated Contact with Patient 12/16/11 0301      Chief Complaint  Patient presents with  . Hemorrhoids    (Consider location/radiation/quality/duration/timing/severity/associated sxs/prior treatment) HPI Judy Cross is a 46 y.o. female who presents to the Emergency Department complaining of continued and worsening rectal pain from a hemorrhoid. She was seen in the ER yesterday and given anusol suppositories. Patient reports it is too painful to insert the suppository. Has not had a BM in 2 days despite stool softener taken today.   PCP Dr. Felecia Shelling   Past Medical History  Diagnosis Date  . 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 Hypertension     Past Surgical History  Procedure Date  . Cesarean section     Family History  Problem Relation Age of Onset  . Hypertension Mother   . Hypertension Father     History  Substance Use Topics  . Smoking status: Current Everyday Smoker -- 0.5 packs/day  . Smokeless tobacco: Never Used  . Alcohol Use: No    OB History    Grav Para Term Preterm Abortions TAB SAB Ect Mult Living                  Review of Systems  Constitutional: Negative for fever.       10 Systems reviewed and are negative for acute change except as noted in the HPI.  HENT: Negative for congestion.   Eyes: Negative for discharge and redness.  Respiratory: Negative for cough and shortness of breath.   Cardiovascular: Negative for chest pain.  Gastrointestinal: Positive for constipation and rectal pain. Negative for vomiting and abdominal pain.  Musculoskeletal: Negative for back pain.  Skin: Negative for rash.  Neurological: Negative for syncope, numbness and headaches.  Psychiatric/Behavioral:       No behavior change.    Allergies  Penicillins  Home Medications   Current Outpatient Rx  Name Route Sig Dispense Refill  . DOCUSATE SODIUM 100 MG PO CAPS Oral Take 1  capsule (100 mg total) by mouth every 12 (twelve) hours. 20 capsule 0  . HYDROCHLOROTHIAZIDE 25 MG PO TABS Oral Take 25 mg by mouth daily.      Marland Kitchen HYDROCORTISONE ACETATE 25 MG RE SUPP Rectal Place 1 suppository (25 mg total) rectally 2 (two) times daily. After warm tub soak for 5 days. 12 suppository 0  . IBUPROFEN 800 MG PO TABS Oral Take 800 mg by mouth every 8 (eight) hours as needed. FOR PAIN     . LEVETIRACETAM 250 MG PO TABS Oral Take 250 mg by mouth every 12 (twelve) hours. FOR SEIZURES     . LISINOPRIL 20 MG PO TABS Oral Take 20 mg by mouth daily.      Marland Kitchen METHOCARBAMOL 500 MG PO TABS Oral Take 500 mg by mouth 4 (four) times daily. FOR SPASMS       BP 102/74  Pulse 58  Temp 98.4 F (36.9 C) (Oral)  Resp 20  SpO2 98%  LMP 12/10/2011  Physical Exam  Nursing note and vitals reviewed. Constitutional: She appears well-developed and well-nourished.       Awake, alert, nontoxic appearance.Appears uncomfortable  HENT:  Head: Atraumatic.  Eyes: Right eye exhibits no discharge. Left eye exhibits no discharge.  Neck: Neck supple.  Cardiovascular: Normal rate and normal heart sounds.   Pulmonary/Chest: Effort normal. She exhibits no tenderness.  Abdominal: Soft. There is no tenderness. There is no rebound.       Patient refused rectal exam. External exam without external hemorrhoids.  Musculoskeletal: She exhibits no tenderness.       Baseline ROM, no obvious new focal weakness.  Neurological:       Mental status and motor strength appears baseline for patient and situation.  Skin: No rash noted.  Psychiatric: She has a normal mood and affect.    ED Course  Procedures (including critical care time)    1. Hemorrhoid       MDM  Patient with possible internal hemorrhoid or fissure causing rectal pain and reluctance to have a BM.Ordered suppository for patient who refused placement. She asked to be discharged home where she will try herself to insert a suppository. Given surgical  referral. Pt stable in ED with no significant deterioration in condition.The patient appears reasonably screened and/or stabilized for discharge and I doubt any other medical condition or other [facility] requiring further screening, evaluation, or treatment in the ED at this time prior to discharge.  MDM Reviewed: previous chart, nursing note and vitals           Nicoletta Dress. Colon Branch, MD 12/16/11 0330

## 2011-12-16 NOTE — ED Notes (Signed)
Pt states she has been suffering from pain caused by hemorrhoids, pt was seen her yesterday and diagnosed with hemorrhoids. Pt states she received hydrocortisone suppositories and colace and that neither has helped her. States her rectum is too painful to insert the suppositories. Also notes bright red blood when she wipes. Also notes she is unable to sit. Pain 10/10

## 2011-12-18 NOTE — ED Provider Notes (Signed)
Medical screening examination/treatment/procedure(s) were performed by non-physician practitioner and as supervising physician I was immediately available for consultation/collaboration. Devoria Albe, MD, Armando Gang   Ward Givens, MD 12/18/11 2109

## 2012-02-23 NOTE — Patient Instructions (Signed)
[address] Judy Cross  02/23/2012   Your procedure is scheduled on:  02/28/2012  Report to [facility] at 0915 AM.  Call this number if you have problems the morning of surgery: [phone]   Remember:   Do not eat food:After Midnight.  May have clear liquids:until Midnight .  Clear liquids include soda, tea, black coffee, apple or grape juice, broth.  Take these medicines the morning of surgery with A SIP OF WATER: keppra, lisinopril   Do not wear jewelry, make-up or nail polish.  Do not wear lotions, powders, or perfumes. You may wear deodorant.  Do not shave 48 hours prior to surgery. Men may shave face and neck.  Do not bring valuables to the hospital.  Contacts, dentures or bridgework may not be worn into surgery.  Leave suitcase in the car. After surgery it may be brought to your room.  For patients admitted to the hospital, checkout time is 11:00 AM the day of discharge.   Patients discharged the day of surgery will not be allowed to drive home.  Name and phone number of your driver: family  Special Instructions: CHG Shower Use Special Wash: 1/2 bottle night before surgery and 1/2 bottle morning of surgery.   Please read over the following fact sheets that you were given: Pain Booklet, MRSA Information, Surgical Site Infection Prevention, Anesthesia Post-op Instructions and Care and Recovery After Surgery   PATIENT INSTRUCTIONS POST-ANESTHESIA  IMMEDIATELY FOLLOWING SURGERY:  Do not drive or operate machinery for the first twenty four hours after surgery.  Do not make any important decisions for twenty four hours after surgery or while taking narcotic pain medications or sedatives.  If you develop intractable nausea and vomiting or a severe headache please notify your doctor immediately.  FOLLOW-UP:  Please make an appointment with your surgeon as instructed. You do not need to follow up with anesthesia unless specifically instructed to do so.  WOUND CARE INSTRUCTIONS (if  applicable):  Keep a dry clean dressing on the anesthesia/puncture wound site if there is drainage.  Once the wound has quit draining you may leave it open to air.  Generally you should leave the bandage intact for twenty four hours unless there is drainage.  If the epidural site drains for more than 36-48 hours please call the anesthesia department.  QUESTIONS?:  Please feel free to call your physician or the hospital operator if you have any questions, and they will be happy to assist you.

## 2012-02-26 ENCOUNTER — Encounter (HOSPITAL_COMMUNITY): Payer: Self-pay

## 2012-02-26 ENCOUNTER — Other Ambulatory Visit: Payer: Self-pay

## 2012-02-26 ENCOUNTER — Encounter (HOSPITAL_COMMUNITY)
Admission: RE | Admit: 2012-02-26 | Discharge: 2012-02-26 | Disposition: A | Discharge status: Home / Self Care | Payer: Medicaid Other | Source: Ambulatory Visit | Attending: General Surgery | Admitting: General Surgery

## 2012-02-26 HISTORY — DX: Unspecified osteoarthritis, unspecified site: M19.90

## 2012-02-26 HISTORY — DX: Unspecified convulsions: R56.9

## 2012-02-26 LAB — CBC WITH DIFFERENTIAL/PLATELET
Basophils Absolute: 0 10*3/uL (ref 0.0–0.1)
Basophils Relative: 0 % (ref 0–1)
Eosinophils Absolute: 0.3 10*3/uL (ref 0.0–0.7)
Eosinophils Relative: 5 % (ref 0–5)
HCT: 38 % (ref 36.0–46.0)
Hemoglobin: 13.2 g/dL (ref 12.0–15.0)
Lymphocytes Relative: 46 % (ref 12–46)
Lymphs Abs: 2.4 10*3/uL (ref 0.7–4.0)
MCH: 32.9 pg (ref 26.0–34.0)
MCHC: 34.7 g/dL (ref 30.0–36.0)
MCV: 94.8 fL (ref 78.0–100.0)
Monocytes Absolute: 0.3 10*3/uL (ref 0.1–1.0)
Monocytes Relative: 6 % (ref 3–12)
Neutro Abs: 2.3 10*3/uL (ref 1.7–7.7)
Neutrophils Relative %: 44 % (ref 43–77)
Platelets: 259 10*3/uL (ref 150–400)
RBC: 4.01 MIL/uL (ref 3.87–5.11)
RDW: 13.7 % (ref 11.5–15.5)
WBC: 5.2 10*3/uL (ref 4.0–10.5)

## 2012-02-26 LAB — BASIC METABOLIC PANEL
BUN: 17 mg/dL (ref 6–23)
CO2: 24 mEq/L (ref 19–32)
Calcium: 9.8 mg/dL (ref 8.4–10.5)
Chloride: 105 mEq/L (ref 96–112)
Creatinine, Ser: 0.99 mg/dL (ref 0.50–1.10)
GFR calc Af Amer: 77 mL/min — ABNORMAL LOW (ref >90)
GFR calc non Af Amer: 67 mL/min — ABNORMAL LOW (ref >90)
Glucose, Bld: 101 mg/dL — ABNORMAL HIGH (ref 70–99)
Potassium: 3.9 mEq/L (ref 3.5–5.1)
Sodium: 140 mEq/L (ref 135–145)

## 2012-02-26 LAB — HCG, SERUM, QUALITATIVE: Preg, Serum: NEGATIVE

## 2012-02-26 LAB — SURGICAL PCR SCREEN
MRSA, PCR: NEGATIVE
Staphylococcus aureus: POSITIVE — AB

## 2012-02-26 NOTE — H&P (Signed)
NTS SOAP Note  Vital Signs:  Vitals as of: 12/21/2011: Systolic 127: Diastolic 86: Heart Rate 69: Temp 97.57F: Height 56ft 1in: Weight 152Lbs 0 Ounces: Pain Level 10: BMI 29  BMI : 28.72 kg/m2  Subjective: This 26 Years 44 Months old Female presents for of Perirectal pain.  Pain started a couple weeks ago.  Was seen in the ED and told she had internal hemorrhoid.  No bleeding except after straining for BM.  Has tearing / burning sensation with BM.  Constipation.  Fears going to the bathroom.  Drinking lots of water.  No significant improvement with medications.  Feels like she is passing glass.  No fever or chills.  No similar episodes in the past.  Review of Symptoms:  Constitutional:unremarkable   Head:unremarkable    Eyes:unremarkable   Nose/Mouth/Throat:unremarkable Cardiovascular:  unremarkable   Respiratory:unremarkable       as per HPI Genitourinary:unremarkable     Musculoskeletal:unremarkable   Skin:unremarkable Breast:unremarkable   Hematolgic/Lymphatic:unremarkable     Allergic/Immunologic:unremarkable     Past Medical History:  Obtained     Past Medical History  Pregnancy Gravida: 3 Pregnancy Para: 3 Surgical History: none Medical Problems: HTN Psychiatric History: none Allergies: NKDA Medications: amitiza, hydrocortisone supp, lidocaine, leveteria, doculsate, lisinopril   Social History:Obtained  Social History  Preferred Language: English ([place]) Race:  Black or African American Ethnicity: Not Hispanic / Latino Age: 46 Years 10 Months Marital Status:  S Alcohol: none Recreational drug(s): none   Smoking Status: Current every day smoker reviewed on 12/21/2011 Started Date: 06/12/1980 Packs per day: 0.25   Family History:Obtained     Family History  Is there a family history of:DM, HTN    Objective Information: General:  Well appearing, well nourished in no distress. Skin:     no  rash or prominent lesions Head:Atraumatic; no masses; no abnormalities Eyes:  conjunctiva clear, EOM intact, PERRL Mouth:  Mucous membranes moist, no mucosal lesions.  Poor dentition Neck:  Supple without lymphadenopathy.  Heart:  RRR, no murmur Lungs:    CTA bilaterally, no wheezes, rhonchi, rales.  Breathing unlabored. Abdomen:Soft, NT/ND, no HSM, no masses.     severe pain on gentle palpation.  No hemorrhoidal tissue.  No erythema.  Assessment:              Follow Up - 02/22/2012  Patient Name: HopeHairston Date of Birth: February 23, 1966  Vital Signs:  Insert Vitals as of: 02/22/2012: Systolic 125: Diastolic 92: Heart Rate 79: Temp 36.67C: Height 155CM: Weight 68.04KG: Pain Level 10: BMI 28   BMI: 28.34 kg/m2  Subjective: This 24 Years 75 Months old Female presents for followup. Patient has    significant complaints. Pain woorse.  It is pretty much constant.  No improvement with NTG ointment.  No change with BM.   Social History:   Social History  Preferred Language: English ([place]) Race:  Black or African American Ethnicity: Not Hispanic / Latino Age: 12 Years 10 Months Marital Status:  S Alcohol: none Recreational drug(s): none       Allergies:  Allergies Insert Code: No allergies found.     Objective: General: Well appearing, well nourished in no distress. Abdomen:  soft.  No pain.  Rectal deferred due to pain.    Assessment:     PO ZOX:[id number]  Plan:  Anal fistula.  Will schedule for eua.  Likely sphinterotomy.  Risks discussed.  Will schedule at her convenience.  Follow-up:Pending Surgery    Plan: Perirectal pain.  Suspect anal fissure.  Will trial NTG ointment.  Continue stool softner.  Continue PO fluid hydration.  Surgical indications discussed with the patient.  Patient to call with issues.   Follow-up:Months 1

## 2012-02-28 ENCOUNTER — Encounter (HOSPITAL_COMMUNITY): Payer: Self-pay | Admitting: Anesthesiology

## 2012-02-28 ENCOUNTER — Encounter (HOSPITAL_COMMUNITY)
Admission: RE | Disposition: A | Discharge status: Home / Self Care | Payer: Self-pay | Source: Ambulatory Visit | Attending: General Surgery

## 2012-02-28 ENCOUNTER — Ambulatory Visit (HOSPITAL_COMMUNITY)
Admission: RE | Admit: 2012-02-28 | Discharge: 2012-02-28 | Disposition: A | Discharge status: Home / Self Care | Payer: Medicaid Other | Source: Ambulatory Visit | Attending: General Surgery | Admitting: General Surgery

## 2012-02-28 ENCOUNTER — Encounter (HOSPITAL_COMMUNITY): Payer: Self-pay | Admitting: *Deleted

## 2012-02-28 ENCOUNTER — Ambulatory Visit (HOSPITAL_COMMUNITY): Payer: Medicaid Other | Admitting: Anesthesiology

## 2012-02-28 DIAGNOSIS — Z01810 Encounter for preprocedural cardiovascular examination: Secondary | ICD-10-CM | POA: Insufficient documentation

## 2012-02-28 DIAGNOSIS — Z01812 Encounter for preprocedural laboratory examination: Secondary | ICD-10-CM | POA: Insufficient documentation

## 2012-02-28 DIAGNOSIS — K602 Anal fissure, unspecified: Secondary | ICD-10-CM

## 2012-02-28 DIAGNOSIS — I10 Essential (primary) hypertension: Secondary | ICD-10-CM | POA: Insufficient documentation

## 2012-02-28 DIAGNOSIS — Z79899 Other long term (current) drug therapy: Secondary | ICD-10-CM | POA: Insufficient documentation

## 2012-02-28 HISTORY — PX: SPHINCTEROTOMY: SHX5279

## 2012-02-28 HISTORY — PX: EXAMINATION UNDER ANESTHESIA: SHX1540

## 2012-02-28 SURGERY — EXAM UNDER ANESTHESIA
Anesthesia: General | Site: Rectum | Wound class: Dirty or Infected

## 2012-02-28 MED ORDER — LIDOCAINE VISCOUS 2 % MT SOLN
OROMUCOSAL | Status: AC
  Filled 2012-02-28: qty 15

## 2012-02-28 MED ORDER — LIDOCAINE HCL (PF) 1 % IJ SOLN
INTRAMUSCULAR | Status: AC
  Filled 2012-02-28: qty 5

## 2012-02-28 MED ORDER — ENOXAPARIN SODIUM 40 MG/0.4ML ~~LOC~~ SOLN
SUBCUTANEOUS | Status: AC
  Filled 2012-02-28: qty 0.4

## 2012-02-28 MED ORDER — PROPOFOL 10 MG/ML IV BOLUS
INTRAVENOUS | Status: DC | PRN
  Administered 2012-02-28: 200 mg via INTRAVENOUS

## 2012-02-28 MED ORDER — ARTIFICIAL TEARS OP OINT
TOPICAL_OINTMENT | OPHTHALMIC | Status: AC
  Filled 2012-02-28: qty 3.5

## 2012-02-28 MED ORDER — ONDANSETRON HCL 4 MG/2ML IJ SOLN: 4.0000 mg | Freq: Once | INTRAMUSCULAR | Status: DC | PRN

## 2012-02-28 MED ORDER — LACTATED RINGERS IV SOLN
INTRAVENOUS | Status: DC | PRN
  Administered 2012-02-28: 08:00:00 via INTRAVENOUS

## 2012-02-28 MED ORDER — FENTANYL CITRATE 0.05 MG/ML IJ SOLN
INTRAMUSCULAR | Status: AC
  Filled 2012-02-28: qty 2

## 2012-02-28 MED ORDER — HEMOSTATIC AGENTS (NO CHARGE) OPTIME
TOPICAL | Status: DC | PRN
  Administered 2012-02-28: 1 via TOPICAL

## 2012-02-28 MED ORDER — DEXTROSE 5 % IV SOLN
2.0000 g | INTRAVENOUS | Status: DC | PRN
  Administered 2012-02-28: 2 g via INTRAVENOUS

## 2012-02-28 MED ORDER — MIDAZOLAM HCL 5 MG/5ML IJ SOLN
INTRAMUSCULAR | Status: DC | PRN
  Administered 2012-02-28: 2 mg via INTRAVENOUS

## 2012-02-28 MED ORDER — LACTATED RINGERS IV SOLN
INTRAVENOUS | Status: DC
  Administered 2012-02-28: 1000 mL via INTRAVENOUS

## 2012-02-28 MED ORDER — CELECOXIB 100 MG PO CAPS
ORAL_CAPSULE | ORAL | Status: AC
  Filled 2012-02-28: qty 4

## 2012-02-28 MED ORDER — BUPIVACAINE HCL (PF) 0.5 % IJ SOLN
INTRAMUSCULAR | Status: AC
  Filled 2012-02-28: qty 30

## 2012-02-28 MED ORDER — CELECOXIB 100 MG PO CAPS
400.0000 mg | ORAL_CAPSULE | Freq: Every day | ORAL | Status: AC
  Administered 2012-02-28: 400 mg via ORAL

## 2012-02-28 MED ORDER — LIDOCAINE VISCOUS 2 % MT SOLN: OROMUCOSAL | Status: DC

## 2012-02-28 MED ORDER — MIDAZOLAM HCL 2 MG/2ML IJ SOLN
INTRAMUSCULAR | Status: AC
  Filled 2012-02-28: qty 2

## 2012-02-28 MED ORDER — ENOXAPARIN SODIUM 40 MG/0.4ML ~~LOC~~ SOLN
40.0000 mg | Freq: Once | SUBCUTANEOUS | Status: AC
  Administered 2012-02-28: 40 mg via SUBCUTANEOUS

## 2012-02-28 MED ORDER — MIDAZOLAM HCL 2 MG/2ML IJ SOLN
1.0000 mg | INTRAMUSCULAR | Status: DC | PRN
  Administered 2012-02-28: 2 mg via INTRAVENOUS

## 2012-02-28 MED ORDER — DEXTROSE 5 % IV SOLN: 2.0000 g | INTRAVENOUS | Status: DC

## 2012-02-28 MED ORDER — FENTANYL CITRATE 0.05 MG/ML IJ SOLN
25.0000 ug | INTRAMUSCULAR | Status: DC | PRN
  Administered 2012-02-28 (×4): 50 ug via INTRAVENOUS

## 2012-02-28 MED ORDER — LIDOCAINE HCL (CARDIAC) 10 MG/ML IV SOLN
INTRAVENOUS | Status: DC | PRN
  Administered 2012-02-28: 50 mg via INTRAVENOUS

## 2012-02-28 MED ORDER — 0.9 % SODIUM CHLORIDE (POUR BTL) OPTIME
TOPICAL | Status: DC | PRN
  Administered 2012-02-28: 1000 mL

## 2012-02-28 MED ORDER — PROPOFOL 10 MG/ML IV EMUL
INTRAVENOUS | Status: AC
  Filled 2012-02-28: qty 20

## 2012-02-28 MED ORDER — FENTANYL CITRATE 0.05 MG/ML IJ SOLN
INTRAMUSCULAR | Status: DC | PRN
  Administered 2012-02-28 (×2): 25 ug via INTRAVENOUS
  Administered 2012-02-28: 50 ug via INTRAVENOUS

## 2012-02-28 MED ORDER — DEXTROSE 5 % IV SOLN
INTRAVENOUS | Status: AC
  Filled 2012-02-28: qty 2

## 2012-02-28 MED ORDER — LIDOCAINE VISCOUS 2 % MT SOLN
OROMUCOSAL | Status: DC | PRN
  Administered 2012-02-28: 1

## 2012-02-28 SURGICAL SUPPLY — 25 items
BAG HAMPER (MISCELLANEOUS) ×2 IMPLANT
CLOTH BEACON ORANGE TIMEOUT ST (SAFETY) ×2 IMPLANT
COVER LIGHT HANDLE STERIS (MISCELLANEOUS) ×4 IMPLANT
DECANTER SPIKE VIAL GLASS SM (MISCELLANEOUS) ×2 IMPLANT
ELECT REM PT RETURN 9FT ADLT (ELECTROSURGICAL) ×2
ELECTRODE REM PT RTRN 9FT ADLT (ELECTROSURGICAL) ×1 IMPLANT
GLOVE BIOGEL PI IND STRL 7.0 (GLOVE) ×2 IMPLANT
GLOVE BIOGEL PI IND STRL 7.5 (GLOVE) ×1 IMPLANT
GLOVE BIOGEL PI INDICATOR 7.0 (GLOVE) ×2
GLOVE BIOGEL PI INDICATOR 7.5 (GLOVE) ×1
GLOVE ECLIPSE 7.0 STRL STRAW (GLOVE) ×2 IMPLANT
GLOVE EXAM NITRILE MD LF STRL (GLOVE) ×2 IMPLANT
GLOVE OPTIFIT SS 6.5 STRL BRWN (GLOVE) ×2 IMPLANT
GOWN STRL REIN XL XLG (GOWN DISPOSABLE) ×6 IMPLANT
KIT ROOM TURNOVER APOR (KITS) ×2 IMPLANT
MANIFOLD NEPTUNE II (INSTRUMENTS) ×2 IMPLANT
MARKER SKIN DUAL TIP RULER LAB (MISCELLANEOUS) ×2 IMPLANT
NEEDLE HYPO 25X1 1.5 SAFETY (NEEDLE) ×2 IMPLANT
PACK PERI GYN (CUSTOM PROCEDURE TRAY) ×2 IMPLANT
PAD ARMBOARD 7.5X6 YLW CONV (MISCELLANEOUS) ×2 IMPLANT
SET BASIN LINEN APH (SET/KITS/TRAYS/PACK) ×2 IMPLANT
SPONGE GAUZE 4X4 12PLY (GAUZE/BANDAGES/DRESSINGS) ×4 IMPLANT
SUT CHROMIC 2 0 CT 1 (SUTURE) ×2 IMPLANT
SYR CONTROL 10ML LL (SYRINGE) ×2 IMPLANT
TOWEL OR 17X26 4PK STRL BLUE (TOWEL DISPOSABLE) IMPLANT

## 2012-02-28 NOTE — Preoperative (Signed)
Beta Blockers   Reason not to administer Beta Blockers:Not Applicable 

## 2012-02-28 NOTE — Anesthesia Postprocedure Evaluation (Signed)
  Anesthesia Post-op Note  Patient: Judy Cross  Procedure(s) Performed: Procedure(s) (LRB) with comments: EXAM UNDER ANESTHESIA (N/A) SPHINCTEROTOMY (N/A) - Lateral Internal Sphincterotomy  Patient Location: PACU  Anesthesia Type: general  Level of Consciousness: awake, alert , oriented and patient cooperative  Airway and Oxygen Therapy: Patient Spontanous Breathing and Patient connected to face mask oxygen  Post-op Pain: mild  Post-op Assessment: Post-op Vital signs reviewed, Patient's Cardiovascular Status Stable, Respiratory Function Stable, Patent Airway and No signs of Nausea or vomiting  Post-op Vital Signs: Reviewed and stable  Complications: No apparent anesthesia complications

## 2012-02-28 NOTE — Anesthesia Preprocedure Evaluation (Signed)
Anesthesia Evaluation  Patient identified by MRN, date of birth, ID band Patient awake    Reviewed: Allergy & Precautions, H&P , NPO status , Patient's Chart, lab work & pertinent test results  History of Anesthesia Complications Negative for: history of anesthetic complications  Airway Mallampati: II TM Distance: >3 FB     Dental  (+) Partial Upper   Pulmonary neg pulmonary ROS, Current Smoker,  breath sounds clear to auscultation        Cardiovascular hypertension, Pt. on medications Rhythm:Regular Rate:Normal     Neuro/Psych Seizures -, Well Controlled,     GI/Hepatic   Endo/Other    Renal/GU      Musculoskeletal   Abdominal   Peds  Hematology   Anesthesia Other Findings   Reproductive/Obstetrics                           Anesthesia Physical Anesthesia Plan  ASA: II  Anesthesia Plan: General   Post-op Pain Management:    Induction: Intravenous  Airway Management Planned: LMA  Additional Equipment:   Intra-op Plan:   Post-operative Plan: Extubation in OR  Informed Consent: I have reviewed the patients History and Physical, chart, labs and discussed the procedure including the risks, benefits and alternatives for the proposed anesthesia with the patient or authorized representative who has indicated his/her understanding and acceptance.     Plan Discussed with:   Anesthesia Plan Comments:         Anesthesia Quick Evaluation  

## 2012-02-28 NOTE — Transfer of Care (Signed)
  Anesthesia Post-op Note  Patient: Judy Cross  Procedure(s) Performed: Procedure(s) (LRB) with comments: EXAM UNDER ANESTHESIA (N/A) SPHINCTEROTOMY (N/A) - Lateral Internal Sphincterotomy  Patient Location: PACU  Anesthesia Type: General  Level of Consciousness: awake, alert , oriented and patient cooperative  Airway and Oxygen Therapy: Patient Spontanous Breathing and Patient connected to face mask oxygen  Post-op Pain: mild  Post-op Assessment: Post-op Vital signs reviewed, Patient's Cardiovascular Status Stable, Respiratory Function Stable, Patent Airway and No signs of Nausea or vomiting  Post-op Vital Signs: Reviewed and stable  Complications: No apparent anesthesia complications

## 2012-02-28 NOTE — Op Note (Signed)
Patient:  Judy Cross  DOB:  [birth year]  MRN:  [id number]   Preop Diagnosis:  Anal fissure  Postop Diagnosis:  The same  Procedure:  Exam under anesthesia and lateral internal sphincterotomy  Surgeon:  Dr. Tilford Pillar  Anes:  General endotracheal, 0.5% Sensorcaine plain for local. Viscous lidocaine for topical.  Indications:  Patient is a 46 year old female well-known to me with a history of perirectal pain. Due to her symptomatology has been suspected patient had an anal fissure. Exam as an outpatient was limited do to severe pain hours she was treated medically for suspected fissure. She initially had response to nitroglycerin ointment however her symptomatology soon returned increased despite continued medication usage. Based on this the recommendations to proceed with exam under anesthesia or discussed as well as the possible sphincterotomy should she have a noted fever. Risks benefits and alternatives were discussed at length the patient including but not limited to risk of bleeding, infection, possible rectal incontinence. Her questions and concerns were addressed the patient was consented for the planned procedure.  Procedure note:  Patient is taken to the or is placed into a supine position on the operating table time the general anesthetic is a Optician, dispensing. Once patient was asleep she symmetrically intubated by the nurse anesthetist. At this point she's placed into bilateral yellow thin stirrups in a high lithotomy position. Her perineum and rectum were prepped with Betadine solution and draped in standard fashion. A digital rectal exam demonstrated no evidence of any mass however there was a palpable mucosal defects posteriorly. The speculum was inserted and was noted to be a sizable approximately 1.5 cm tear/fissure on the posterior mucosal edge. It was noted that it was fairly difficult to pass a speculum due to the spasm within the anus. I therefore opted to proceed with a  sphincterotomy. A small incision was created laterally at approximately the 3:00 position in a supine position with a 15 blade scalpel. The blade was carefully advanced parallel to the anus and rectum. Palpation of the suture muscles carried out with a digital rectal exam mildly blade was carefully advanced to the internal sphincter. I was able to appreciate relaxation of the an opening during this procedure. I was comfortable with the degree of her laxation and return my attention to the fissure. I did use a curette to free the overlying inflammatory tissue. Due to the size I also carefully readvanced the mucosa over the fissure using a 3-0 chromic suture. Upon completion a tampon created with Surgicel and viscous lidocaine was inserted. 4 x 4 gauze dressings were then rolled and placed over the anal opening to secure the tampon. The drapes removed the dressing was secured with a single Mediport tape application. Patient was allowed to come out of general anesthetic was transferred to PACU in stable condition. At the conclusion of procedure all instrument, sponge, needle counts are correct. Patient tolerated procedure extremely well.  Complications:  None apparent  EBL:  Minimal  Specimen:  None

## 2012-02-28 NOTE — Interval H&P Note (Signed)
History and Physical Interval Note:  02/28/2012 7:52 AM  Judy Cross  has presented today for surgery, with the diagnosis of perirectal pain  The various methods of treatment have been discussed with the patient and family. After consideration of risks, benefits and other options for treatment, the patient has consented to  Procedure(s) (LRB) with comments: EXAM UNDER ANESTHESIA (N/A) SPHINCTEROTOMY (N/A) - Possible Sphincterotomy as a surgical intervention .  The patient's history has been reviewed, patient examined, no change in status, stable for surgery.  I have reviewed the patient's chart and labs.  Questions were answered to the patient's satisfaction.     Lorita Forinash C

## 2012-02-28 NOTE — Anesthesia Procedure Notes (Signed)
Procedure Name: LMA Insertion Date/Time: 02/28/2012 8:39 AM Performed by: Carolyne Littles, AMY L Pre-anesthesia Checklist: Patient identified, Patient being monitored, Emergency Drugs available, Timeout performed and Suction available Patient Re-evaluated:Patient Re-evaluated prior to inductionOxygen Delivery Method: Circle system utilized Preoxygenation: Pre-oxygenation with 100% oxygen Intubation Type: IV induction Ventilation: Mask ventilation without difficulty LMA: LMA inserted LMA Size: 4.0 Tube size: 4.0 mm Number of attempts: 1 Placement Confirmation: positive ETCO2 and breath sounds checked- equal and bilateral Tube secured with: Tape Dental Injury: Teeth and Oropharynx as per pre-operative assessment

## 2012-03-01 ENCOUNTER — Encounter (HOSPITAL_COMMUNITY): Payer: Self-pay | Admitting: General Surgery

## 2012-07-13 ENCOUNTER — Encounter (HOSPITAL_COMMUNITY): Payer: Self-pay | Admitting: *Deleted

## 2012-07-13 ENCOUNTER — Emergency Department (HOSPITAL_COMMUNITY)
Admission: EM | Admit: 2012-07-13 | Discharge: 2012-07-13 | Disposition: A | Discharge status: Home / Self Care | Payer: Medicaid Other | Attending: Emergency Medicine | Admitting: Emergency Medicine

## 2012-07-13 DIAGNOSIS — X58XXXA Exposure to other specified factors, initial encounter: Secondary | ICD-10-CM | POA: Insufficient documentation

## 2012-07-13 DIAGNOSIS — S31831A Laceration without foreign body of anus, initial encounter: Secondary | ICD-10-CM

## 2012-07-13 DIAGNOSIS — Z8739 Personal history of other diseases of the musculoskeletal system and connective tissue: Secondary | ICD-10-CM | POA: Insufficient documentation

## 2012-07-13 DIAGNOSIS — I10 Essential (primary) hypertension: Secondary | ICD-10-CM | POA: Insufficient documentation

## 2012-07-13 DIAGNOSIS — Y929 Unspecified place or not applicable: Secondary | ICD-10-CM | POA: Insufficient documentation

## 2012-07-13 DIAGNOSIS — Y939 Activity, unspecified: Secondary | ICD-10-CM | POA: Insufficient documentation

## 2012-07-13 DIAGNOSIS — Z8719 Personal history of other diseases of the digestive system: Secondary | ICD-10-CM | POA: Insufficient documentation

## 2012-07-13 DIAGNOSIS — F17200 Nicotine dependence, unspecified, uncomplicated: Secondary | ICD-10-CM | POA: Insufficient documentation

## 2012-07-13 DIAGNOSIS — S36899A Unspecified injury of other intra-abdominal organs, initial encounter: Secondary | ICD-10-CM | POA: Insufficient documentation

## 2012-07-13 DIAGNOSIS — G40909 Epilepsy, unspecified, not intractable, without status epilepticus: Secondary | ICD-10-CM | POA: Insufficient documentation

## 2012-07-13 LAB — BASIC METABOLIC PANEL
BUN: 11 mg/dL (ref 6–23)
CO2: 26 mEq/L (ref 19–32)
Calcium: 9.8 mg/dL (ref 8.4–10.5)
Chloride: 101 mEq/L (ref 96–112)
Creatinine, Ser: 0.88 mg/dL (ref 0.50–1.10)
GFR calc Af Amer: 89 mL/min — ABNORMAL LOW (ref >90)
GFR calc non Af Amer: 77 mL/min — ABNORMAL LOW (ref >90)
Glucose, Bld: 96 mg/dL (ref 70–99)
Potassium: 3.3 mEq/L — ABNORMAL LOW (ref 3.5–5.1)
Sodium: 137 mEq/L (ref 135–145)

## 2012-07-13 LAB — CBC
HCT: 38.5 % (ref 36.0–46.0)
Hemoglobin: 13.5 g/dL (ref 12.0–15.0)
MCH: 32.8 pg (ref 26.0–34.0)
MCHC: 35.1 g/dL (ref 30.0–36.0)
MCV: 93.4 fL (ref 78.0–100.0)
Platelets: 264 10*3/uL (ref 150–400)
RBC: 4.12 MIL/uL (ref 3.87–5.11)
RDW: 13 % (ref 11.5–15.5)
WBC: 6.2 10*3/uL (ref 4.0–10.5)

## 2012-07-13 NOTE — ED Provider Notes (Signed)
History     CSN: [id number]  Arrival date & time 07/13/12  0920   First MD Initiated Contact with Patient 07/13/12 0920      Chief Complaint  Patient presents with  . Rectal Bleeding     HPI Patient reports history of anal fissure in the past requiring surgery.  Today she has small amount of gross blood on the toilet paper when she wiped.  She had a painful bowel movement.  She's been taking her stool softeners as prescribed.  No nausea or vomiting.  No abdominal pain.  She continues a small amount of rectal pain.  No additional bleeding has occurred in her underwear.  No other complaints.  Her symptoms are mild to moderate in severity.  Nothing worsens or improves her pain.   Past Medical History  Diagnosis Date  . 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 Hypertension   . Seizures     last seizure was a year ago  . Arthritis     Past Surgical History  Procedure Date  . Cesarean section     x 3  . Examination under anesthesia 02/28/2012    Procedure: EXAM UNDER ANESTHESIA;  Surgeon: Fabio Bering, MD;  Location: [facility];  Service: General;  Laterality: N/A;  . Sphincterotomy 02/28/2012    Procedure: SPHINCTEROTOMY;  Surgeon: Fabio Bering, MD;  Location: [facility];  Service: General;  Laterality: N/A;  Lateral Internal Sphincterotomy    Family History  Problem Relation Age of Onset  . Hypertension Mother   . Hypertension Father     History  Substance Use Topics  . Smoking status: Current Every Day Smoker -- 0.5 packs/day for 20 years    Types: Cigarettes  . Smokeless tobacco: Never Used  . Alcohol Use: Yes     Comment: occasional    OB History    Grav Para Term Preterm Abortions TAB SAB Ect Mult Living                  Review of Systems  All other systems reviewed and are negative.    Allergies  Penicillins  Home Medications   Current Outpatient Rx  Name  Route  Sig  Dispense  Refill  . ALBUTEROL SULFATE HFA 108 (90 BASE) MCG/ACT IN AERS  Inhalation   Inhale 2 puffs into the lungs at bedtime.         Marland Kitchen CETIRIZINE HCL 10 MG PO TABS   Oral   Take 10 mg by mouth daily.         Marland Kitchen DOCUSATE SODIUM 50 MG PO CAPS   Oral   Take 100 mg by mouth every other day as needed. For constipation         . HYDROCHLOROTHIAZIDE 25 MG PO TABS   Oral   Take 25 mg by mouth daily.          Marland Kitchen HYDROCODONE-ACETAMINOPHEN 5-325 MG PO TABS   Oral   Take 0.5 tablets by mouth every 6 (six) hours as needed. For back pain         . IBUPROFEN 800 MG PO TABS   Oral   Take 800 mg by mouth every 8 (eight) hours as needed. FOR PAIN          . LEVETIRACETAM 500 MG PO TABS   Oral   Take 500 mg by mouth every 12 (twelve) hours.         Marland Kitchen LISINOPRIL 20 MG PO TABS  Oral   Take 20 mg by mouth daily.             BP 117/87  Pulse 77  Temp 98.4 F (36.9 C) (Oral)  Resp 20  SpO2 99%  LMP 05/12/2012  Physical Exam  Nursing note and vitals reviewed. Constitutional: She is oriented to person, place, and time. She appears well-developed and well-nourished.  HENT:  Head: Normocephalic.  Eyes: EOM are normal.  Neck: Normal range of motion.  Pulmonary/Chest: Effort normal.  Abdominal: She exhibits no distension.  Genitourinary:       Small anal tear.  No active bleeding.  No blood coming from rectum.  No obvious change fissure noted.  Digital rectal exam deferred.  Musculoskeletal: Normal range of motion.  Neurological: She is alert and oriented to person, place, and time.  Psychiatric: She has a normal mood and affect.    ED Course  Procedures (including critical care time)   Labs Reviewed  CBC  BASIC METABOLIC PANEL   No results found.   1. Anal tear       MDM  Discharge home with general surgery followup.  Sitz baths recommended        Lyanne Co, MD 07/13/12 1001

## 2012-07-13 NOTE — ED Notes (Signed)
Pt states Hx of anal fissure last year. States she noticed bright red blood with BM.

## 2012-09-04 ENCOUNTER — Other Ambulatory Visit (HOSPITAL_COMMUNITY): Payer: Self-pay | Admitting: Internal Medicine

## 2012-09-04 ENCOUNTER — Other Ambulatory Visit (HOSPITAL_COMMUNITY): Payer: Self-pay | Admitting: Orthopaedic Surgery

## 2012-09-04 DIAGNOSIS — M545 Low back pain, unspecified: Secondary | ICD-10-CM

## 2012-09-04 DIAGNOSIS — Z139 Encounter for screening, unspecified: Secondary | ICD-10-CM

## 2012-09-06 ENCOUNTER — Ambulatory Visit (HOSPITAL_COMMUNITY)
Admission: RE | Admit: 2012-09-06 | Discharge: 2012-09-06 | Disposition: A | Discharge status: Home / Self Care | Payer: Medicaid Other | Source: Ambulatory Visit | Attending: Orthopaedic Surgery | Admitting: Orthopaedic Surgery

## 2012-09-06 DIAGNOSIS — M5126 Other intervertebral disc displacement, lumbar region: Secondary | ICD-10-CM | POA: Insufficient documentation

## 2012-09-06 DIAGNOSIS — M545 Low back pain, unspecified: Secondary | ICD-10-CM | POA: Insufficient documentation

## 2012-09-09 ENCOUNTER — Other Ambulatory Visit (HOSPITAL_COMMUNITY): Payer: Medicaid Other

## 2012-10-14 ENCOUNTER — Ambulatory Visit (HOSPITAL_COMMUNITY)
Admission: RE | Admit: 2012-10-14 | Discharge: 2012-10-14 | Disposition: A | Discharge status: Home / Self Care | Payer: Medicaid Other | Source: Ambulatory Visit | Attending: Internal Medicine | Admitting: Internal Medicine

## 2012-10-14 DIAGNOSIS — Z1231 Encounter for screening mammogram for malignant neoplasm of breast: Secondary | ICD-10-CM | POA: Insufficient documentation

## 2012-10-14 DIAGNOSIS — Z139 Encounter for screening, unspecified: Secondary | ICD-10-CM

## 2013-01-02 ENCOUNTER — Emergency Department (HOSPITAL_COMMUNITY)
Admission: EM | Admit: 2013-01-02 | Discharge: 2013-01-02 | Disposition: A | Discharge status: Home / Self Care | Payer: Medicaid Other | Attending: Emergency Medicine | Admitting: Emergency Medicine

## 2013-01-02 ENCOUNTER — Encounter (HOSPITAL_COMMUNITY): Payer: Self-pay | Admitting: *Deleted

## 2013-01-02 DIAGNOSIS — R209 Unspecified disturbances of skin sensation: Secondary | ICD-10-CM | POA: Insufficient documentation

## 2013-01-02 DIAGNOSIS — Z8739 Personal history of other diseases of the musculoskeletal system and connective tissue: Secondary | ICD-10-CM | POA: Insufficient documentation

## 2013-01-02 DIAGNOSIS — I10 Essential (primary) hypertension: Secondary | ICD-10-CM | POA: Insufficient documentation

## 2013-01-02 DIAGNOSIS — Z880 Allergy status to penicillin: Secondary | ICD-10-CM | POA: Insufficient documentation

## 2013-01-02 DIAGNOSIS — Z79899 Other long term (current) drug therapy: Secondary | ICD-10-CM | POA: Insufficient documentation

## 2013-01-02 DIAGNOSIS — F17200 Nicotine dependence, unspecified, uncomplicated: Secondary | ICD-10-CM | POA: Insufficient documentation

## 2013-01-02 DIAGNOSIS — M545 Low back pain, unspecified: Secondary | ICD-10-CM | POA: Insufficient documentation

## 2013-01-02 DIAGNOSIS — G8929 Other chronic pain: Secondary | ICD-10-CM | POA: Insufficient documentation

## 2013-01-02 MED ORDER — OXYCODONE-ACETAMINOPHEN 5-325 MG PO TABS: 1.0000 | ORAL_TABLET | ORAL | Status: DC | PRN

## 2013-01-02 MED ORDER — HYDROMORPHONE HCL PF 1 MG/ML IJ SOLN
1.0000 mg | Freq: Once | INTRAMUSCULAR | Status: AC
  Administered 2013-01-02: 1 mg via INTRAMUSCULAR
  Filled 2013-01-02: qty 1

## 2013-01-02 MED ORDER — PREDNISONE 10 MG PO TABS: ORAL_TABLET | ORAL | Status: DC

## 2013-01-02 NOTE — ED Notes (Signed)
Chronic back pain worsening x 3 days radiating down right leg with swelling.  Denies new injury.

## 2013-01-02 NOTE — ED Provider Notes (Signed)
History    CSN: [id number] Arrival date & time 01/02/13  2536  First MD Initiated Contact with Patient 01/02/13 [phone]     Chief Complaint  Patient presents with  . Back Pain   (Consider location/radiation/quality/duration/timing/severity/associated sxs/prior Treatment) HPI Comments: Judy Cross is a 47 y.o. female presenting with acute on chronic low back pain which has which has been worsened for past 3 days.   Patient denies any new injury specifically, stating it just flares up for no reason.  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  In the interim,  She states her hydrocodone just does not work anymore.     There has been no weakness  in the lower extremities and no urinary or bowel retention or incontinence.  Patient does not have a history of cancer or IVDU.  She is currently taking robaxin in addition to the hydrocodone.   The history is provided by the patient.   Past Medical History  Diagnosis Date  . 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 Hypertension   . Seizures     last seizure was a year ago  . Arthritis    Past Surgical History  Procedure Laterality Date  . Cesarean section      x 3  . Examination under anesthesia  02/28/2012    Procedure: EXAM UNDER ANESTHESIA;  Surgeon: Fabio Bering, MD;  Location: [facility];  Service: General;  Laterality: N/A;  . Sphincterotomy  02/28/2012    Procedure: SPHINCTEROTOMY;  Surgeon: Fabio Bering, MD;  Location: [facility];  Service: General;  Laterality: N/A;  Lateral Internal Sphincterotomy   Family History  Problem Relation Age of Onset  . Hypertension Mother   . Hypertension Father    History  Substance Use Topics  . Smoking status: Current Every Day Smoker -- 0.50 packs/day for 20 years    Types: Cigarettes  . Smokeless  tobacco: Never Used  . Alcohol Use: Yes     Comment: occasional   OB History   Grav Para Term Preterm Abortions TAB SAB Ect Mult Living                 Review of Systems  Constitutional: Negative for fever.  Respiratory: Negative for shortness of breath.   Cardiovascular: Negative for chest pain and leg swelling.  Gastrointestinal: Negative for abdominal pain, constipation and abdominal distention.  Genitourinary: Negative for dysuria, urgency, frequency, flank pain and difficulty urinating.  Musculoskeletal: Positive for back pain. Negative for joint swelling and gait problem.  Skin: Negative for rash.  Neurological: Positive for numbness. Negative for weakness.    Allergies  Penicillins  Home Medications   Current Outpatient Rx  Name  Route  Sig  Dispense  Refill  . albuterol (PROVENTIL HFA;VENTOLIN HFA) 108 (90 BASE) MCG/ACT inhaler   Inhalation   Inhale 2 puffs into the lungs at bedtime.         . hydrochlorothiazide 25 MG tablet   Oral   Take 25 mg by mouth daily.          Marland Kitchen HYDROcodone-acetaminophen (NORCO/VICODIN) 5-325 MG per tablet   Oral   Take 0.5 tablets by mouth every 6 (six) hours as needed. For back pain         .  ibuprofen (ADVIL,MOTRIN) 800 MG tablet   Oral   Take 800 mg by mouth every 8 (eight) hours as needed. FOR PAIN          . levETIRAcetam (KEPPRA) 500 MG tablet   Oral   Take 500 mg by mouth every 12 (twelve) hours.         Marland Kitchen lisinopril (PRINIVIL,ZESTRIL) 20 MG tablet   Oral   Take 10 mg by mouth daily.          Marland Kitchen loratadine (CLARITIN) 10 MG tablet   Oral   Take 10 mg by mouth daily.         . methocarbamol (ROBAXIN) 500 MG tablet   Oral   Take 500 mg by mouth 4 (four) times daily as needed.         . docusate sodium (COLACE) 50 MG capsule   Oral   Take 100 mg by mouth every other day as needed. For constipation         . oxyCODONE-acetaminophen (PERCOCET/ROXICET) 5-325 MG per tablet   Oral   Take 1 tablet by  mouth every 4 (four) hours as needed for pain.   20 tablet   0   . predniSONE (DELTASONE) 10 MG tablet      6, 5, 4, 3, 2 then 1 tablet by mouth daily for 6 days total.   21 tablet   0    BP 121/78  Pulse 61  Temp(Src) 98.6 F (37 C) (Oral)  Resp 16  Ht 5\' 1"  (1.549 m)  Wt 155 lb (70.308 kg)  BMI 29.3 kg/m2  SpO2 98%  LMP 11/27/2012 Physical Exam  Nursing note and vitals reviewed. Constitutional: She is oriented to person, place, and time. She appears well-developed and well-nourished.  Uncomfortable,  Tearful.  HENT:  Head: Normocephalic and atraumatic.  Mouth/Throat: Oropharynx is clear and moist.  Eyes: Conjunctivae are normal.  Neck: Normal range of motion. Neck supple. No tracheal deviation present.  Cardiovascular: Normal rate and intact distal pulses.   Pedal pulses normal.  Pulmonary/Chest: Effort normal and breath sounds normal. She exhibits no tenderness.  Abdominal: Soft. Bowel sounds are normal. She exhibits no distension. There is no tenderness.  Musculoskeletal: Normal range of motion. She exhibits tenderness. She exhibits no edema.       Lumbar back: She exhibits tenderness. She exhibits no swelling, no edema and no spasm.  Lymphadenopathy:    She has no cervical adenopathy.  Neurological: She is alert and oriented to person, place, and time. She has normal strength. She displays no atrophy, no tremor and normal reflexes. No sensory deficit. She exhibits normal muscle tone. Gait normal.  Reflex Scores:      Patellar reflexes are 2+ on the right side and 2+ on the left side.      Achilles reflexes are 2+ on the right side and 2+ on the left side. No strength deficit noted in hip and knee flexor and extensor muscle groups.  Ankle flexion and extension intact.  Skin: Skin is warm and dry.  Psychiatric: She has a normal mood and affect.    ED Course  Procedures (including critical care time) Labs Reviewed - No data to display No results found. 1. Chronic  back pain     MDM  Pt was given dilaudid 1 mg IM injection.  Oxycodone,  6 day prednisone taper,  encouraged to continue taking robaxin.  F/u with Dr. Gerilyn Pilgrim prn.  No neuro deficit on exam or by history  to suggest emergent or surgical presentation.  Also discussed worsened sx that should prompt immediate re-evaluation including distal weakness, bowel/bladder retention/incontinence.        Burgess Amor, PA-C 01/02/13 1035

## 2013-01-02 NOTE — ED Provider Notes (Signed)
Medical screening examination/treatment/procedure(s) were performed by non-physician practitioner and as supervising physician I was immediately available for consultation/collaboration.   Krishiv Sandler L Ladawna Walgren, MD 01/02/13 1449 

## 2013-01-27 ENCOUNTER — Emergency Department (HOSPITAL_COMMUNITY)
Admission: EM | Admit: 2013-01-27 | Discharge: 2013-01-27 | Disposition: A | Discharge status: Home / Self Care | Payer: Medicaid Other | Attending: Emergency Medicine | Admitting: Emergency Medicine

## 2013-01-27 ENCOUNTER — Encounter (HOSPITAL_COMMUNITY): Payer: Self-pay | Admitting: Emergency Medicine

## 2013-01-27 DIAGNOSIS — I498 Other specified cardiac arrhythmias: Secondary | ICD-10-CM | POA: Insufficient documentation

## 2013-01-27 DIAGNOSIS — Z79899 Other long term (current) drug therapy: Secondary | ICD-10-CM | POA: Insufficient documentation

## 2013-01-27 DIAGNOSIS — Z8739 Personal history of other diseases of the musculoskeletal system and connective tissue: Secondary | ICD-10-CM | POA: Insufficient documentation

## 2013-01-27 DIAGNOSIS — G40909 Epilepsy, unspecified, not intractable, without status epilepticus: Secondary | ICD-10-CM | POA: Insufficient documentation

## 2013-01-27 DIAGNOSIS — I10 Essential (primary) hypertension: Secondary | ICD-10-CM | POA: Insufficient documentation

## 2013-01-27 DIAGNOSIS — M545 Low back pain, unspecified: Secondary | ICD-10-CM | POA: Insufficient documentation

## 2013-01-27 DIAGNOSIS — M129 Arthropathy, unspecified: Secondary | ICD-10-CM | POA: Insufficient documentation

## 2013-01-27 DIAGNOSIS — M79609 Pain in unspecified limb: Secondary | ICD-10-CM | POA: Insufficient documentation

## 2013-01-27 DIAGNOSIS — R600 Localized edema: Secondary | ICD-10-CM

## 2013-01-27 DIAGNOSIS — M7989 Other specified soft tissue disorders: Secondary | ICD-10-CM | POA: Insufficient documentation

## 2013-01-27 DIAGNOSIS — R609 Edema, unspecified: Secondary | ICD-10-CM

## 2013-01-27 DIAGNOSIS — G8929 Other chronic pain: Secondary | ICD-10-CM

## 2013-01-27 DIAGNOSIS — Z880 Allergy status to penicillin: Secondary | ICD-10-CM | POA: Insufficient documentation

## 2013-01-27 LAB — URINALYSIS, ROUTINE W REFLEX MICROSCOPIC
Bilirubin Urine: NEGATIVE
Glucose, UA: NEGATIVE mg/dL
Ketones, ur: NEGATIVE mg/dL
Leukocytes, UA: NEGATIVE
Nitrite: NEGATIVE
Protein, ur: NEGATIVE mg/dL
Specific Gravity, Urine: 1.025 (ref 1.005–1.030)
Urobilinogen, UA: 0.2 mg/dL (ref 0.0–1.0)
pH: 6 (ref 5.0–8.0)

## 2013-01-27 LAB — URINE MICROSCOPIC-ADD ON

## 2013-01-27 LAB — BASIC METABOLIC PANEL
BUN: 15 mg/dL (ref 6–23)
CO2: 25 mEq/L (ref 19–32)
Calcium: 9.2 mg/dL (ref 8.4–10.5)
Chloride: 109 mEq/L (ref 96–112)
Creatinine, Ser: 0.97 mg/dL (ref 0.50–1.10)
GFR calc Af Amer: 79 mL/min — ABNORMAL LOW (ref >90)
GFR calc non Af Amer: 68 mL/min — ABNORMAL LOW (ref >90)
Glucose, Bld: 85 mg/dL (ref 70–99)
Potassium: 4.1 mEq/L (ref 3.5–5.1)
Sodium: 142 mEq/L (ref 135–145)

## 2013-01-27 LAB — HEPATIC FUNCTION PANEL
ALT: 21 U/L (ref 0–35)
AST: 25 U/L (ref 0–37)
Albumin: 3.3 g/dL — ABNORMAL LOW (ref 3.5–5.2)
Alkaline Phosphatase: 43 U/L (ref 39–117)
Bilirubin, Direct: 0.1 mg/dL (ref 0.0–0.3)
Indirect Bilirubin: 0.8 mg/dL (ref 0.3–0.9)
Total Bilirubin: 0.9 mg/dL (ref 0.3–1.2)
Total Protein: 6.8 g/dL (ref 6.0–8.3)

## 2013-01-27 LAB — CBC WITH DIFFERENTIAL/PLATELET
Basophils Absolute: 0 10*3/uL (ref 0.0–0.1)
Basophils Relative: 0 % (ref 0–1)
Eosinophils Absolute: 0.5 10*3/uL (ref 0.0–0.7)
Eosinophils Relative: 9 % — ABNORMAL HIGH (ref 0–5)
HCT: 36.2 % (ref 36.0–46.0)
Hemoglobin: 12.5 g/dL (ref 12.0–15.0)
Lymphocytes Relative: 45 % (ref 12–46)
Lymphs Abs: 2.5 10*3/uL (ref 0.7–4.0)
MCH: 32.5 pg (ref 26.0–34.0)
MCHC: 34.5 g/dL (ref 30.0–36.0)
MCV: 94 fL (ref 78.0–100.0)
Monocytes Absolute: 0.4 10*3/uL (ref 0.1–1.0)
Monocytes Relative: 7 % (ref 3–12)
Neutro Abs: 2.1 10*3/uL (ref 1.7–7.7)
Neutrophils Relative %: 39 % — ABNORMAL LOW (ref 43–77)
Platelets: 257 10*3/uL (ref 150–400)
RBC: 3.85 MIL/uL — ABNORMAL LOW (ref 3.87–5.11)
RDW: 13.6 % (ref 11.5–15.5)
WBC: 5.5 10*3/uL (ref 4.0–10.5)

## 2013-01-27 LAB — PRO B NATRIURETIC PEPTIDE: Pro B Natriuretic peptide (BNP): 288.2 pg/mL — ABNORMAL HIGH (ref 0–125)

## 2013-01-27 MED ORDER — METHOCARBAMOL 500 MG PO TABS
1000.0000 mg | ORAL_TABLET | Freq: Once | ORAL | Status: AC
  Administered 2013-01-27: 1000 mg via ORAL
  Filled 2013-01-27: qty 2

## 2013-01-27 MED ORDER — METHOCARBAMOL 500 MG PO TABS: 500.0000 mg | ORAL_TABLET | Freq: Three times a day (TID) | ORAL | Status: DC

## 2013-01-27 MED ORDER — DICLOFENAC SODIUM 75 MG PO TBEC: 75.0000 mg | DELAYED_RELEASE_TABLET | Freq: Two times a day (BID) | ORAL | Status: DC

## 2013-01-27 MED ORDER — KETOROLAC TROMETHAMINE 10 MG PO TABS
10.0000 mg | ORAL_TABLET | Freq: Once | ORAL | Status: AC
  Administered 2013-01-27: 10 mg via ORAL
  Filled 2013-01-27: qty 1

## 2013-01-27 NOTE — ED Provider Notes (Signed)
CSN: [id number]     Arrival date & time 01/27/13  3244 History     First MD Initiated Contact with Patient 01/27/13 [phone]     Chief Complaint  Patient presents with  . Leg Swelling  . Back Pain   (Consider location/radiation/quality/duration/timing/severity/associated sxs/prior Treatment) Patient is a 47 y.o. female presenting with back pain. The history is provided by the patient.  Back Pain Location:  Lumbar spine Quality:  Aching and stiffness Stiffness is present:  All day Radiates to:  L thigh and R thigh Pain severity:  Severe Pain is:  Same all the time Onset quality:  Gradual (Pt has had this problem for several months, but worse for the last 2 days.) Progression:  Worsening Chronicity:  Chronic Relieved by:  Nothing Worsened by:  Movement and standing Associated symptoms: leg pain   Associated symptoms: no abdominal pain, no bladder incontinence, no bowel incontinence, no chest pain, no dysuria and no perianal numbness     Past Medical History  Diagnosis Date  . 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 Hypertension   . Seizures     last seizure was a year ago  . Arthritis    Past Surgical History  Procedure Laterality Date  . Cesarean section      x 3  . Examination under anesthesia  02/28/2012    Procedure: EXAM UNDER ANESTHESIA;  Surgeon: Fabio Bering, MD;  Location: [facility];  Service: General;  Laterality: N/A;  . Sphincterotomy  02/28/2012    Procedure: SPHINCTEROTOMY;  Surgeon: Fabio Bering, MD;  Location: [facility];  Service: General;  Laterality: N/A;  Lateral Internal Sphincterotomy   Family History  Problem Relation Age of Onset  . Hypertension Mother   . Hypertension Father    History  Substance Use Topics  . Smoking status: Current Every Day Smoker -- 0.50 packs/day for 20 years    Types: Cigarettes  . Smokeless tobacco: Never Used  . Alcohol Use: Yes     Comment: occasional   OB History   Grav Para Term Preterm Abortions TAB SAB Ect  Mult Living                 Review of Systems  Constitutional: Negative for activity change.       All ROS Neg except as noted in HPI  HENT: Negative for nosebleeds and neck pain.   Eyes: Negative for photophobia and discharge.  Respiratory: Negative for cough, shortness of breath and wheezing.   Cardiovascular: Negative for chest pain and palpitations.  Gastrointestinal: Negative for abdominal pain, blood in stool and bowel incontinence.  Genitourinary: Negative for bladder incontinence, dysuria, frequency and hematuria.  Musculoskeletal: Positive for back pain. Negative for arthralgias.  Skin: Negative.   Neurological: Positive for seizures. Negative for dizziness and speech difficulty.  Psychiatric/Behavioral: Negative for hallucinations and confusion.    Allergies  Penicillins  Home Medications   Current Outpatient Rx  Name  Route  Sig  Dispense  Refill  . albuterol (PROVENTIL HFA;VENTOLIN HFA) 108 (90 BASE) MCG/ACT inhaler   Inhalation   Inhale 2 puffs into the lungs at bedtime.         . docusate sodium (COLACE) 50 MG capsule   Oral   Take 100 mg by mouth every other day as needed. For constipation         . hydrochlorothiazide 25 MG tablet   Oral   Take 25 mg by mouth daily.          Marland Kitchen  HYDROcodone-acetaminophen (NORCO/VICODIN) 5-325 MG per tablet   Oral   Take 0.5 tablets by mouth every 6 (six) hours as needed. For back pain         . ibuprofen (ADVIL,MOTRIN) 800 MG tablet   Oral   Take 800 mg by mouth every 8 (eight) hours as needed. FOR PAIN          . levETIRAcetam (KEPPRA) 500 MG tablet   Oral   Take 500 mg by mouth every 12 (twelve) hours.         Marland Kitchen lisinopril (PRINIVIL,ZESTRIL) 20 MG tablet   Oral   Take 10 mg by mouth daily.          Marland Kitchen loratadine (CLARITIN) 10 MG tablet   Oral   Take 10 mg by mouth daily.         . methocarbamol (ROBAXIN) 500 MG tablet   Oral   Take 500 mg by mouth 4 (four) times daily as needed.         Marland Kitchen  oxyCODONE-acetaminophen (PERCOCET/ROXICET) 5-325 MG per tablet   Oral   Take 1 tablet by mouth every 4 (four) hours as needed for pain.   20 tablet   0   . predniSONE (DELTASONE) 10 MG tablet      6, 5, 4, 3, 2 then 1 tablet by mouth daily for 6 days total.   21 tablet   0    BP 155/101  Pulse 50  Temp(Src) 98.7 F (37.1 C)  Resp 18  Ht 5\' 1"  (1.549 m)  Wt 156 lb (70.761 kg)  BMI 29.49 kg/m2  SpO2 100%  LMP 11/27/2012 Physical Exam  Nursing note and vitals reviewed. Constitutional: She is oriented to person, place, and time. She appears well-developed and well-nourished.  Non-toxic appearance.  HENT:  Head: Normocephalic.  Right Ear: Tympanic membrane and external ear normal.  Left Ear: Tympanic membrane and external ear normal.  Mild puffiness of the face bilat.  Eyes: EOM and lids are normal. Pupils are equal, round, and reactive to light.  Neck: Normal range of motion. Neck supple. Carotid bruit is not present.  Cardiovascular: Regular rhythm, normal heart sounds, intact distal pulses and normal pulses.  Bradycardia present.   Pulmonary/Chest: Breath sounds normal. No respiratory distress.  Abdominal: Soft. Bowel sounds are normal. There is no tenderness. There is no guarding.  Musculoskeletal: Normal range of motion.  There is puffiness of the right and left lower extremities, but no pitting edema. No redness. No palpable cords of the lower extremities. Lower lumbar area pain. No palpable step off. No hot areas.   Lymphadenopathy:       Head (right side): No submandibular adenopathy present.       Head (left side): No submandibular adenopathy present.    She has no cervical adenopathy.  Neurological: She is alert and oriented to person, place, and time. She has normal strength. No cranial nerve deficit or sensory deficit.  Skin: Skin is warm and dry.  Psychiatric: She has a normal mood and affect. Her speech is normal.    ED Course   Procedures (including  critical care time)  Labs Reviewed  CBC WITH DIFFERENTIAL  HEPATIC FUNCTION PANEL  URINALYSIS, ROUTINE W REFLEX MICROSCOPIC  PRO B NATRIURETIC PEPTIDE  BASIC METABOLIC PANEL   No results found. No diagnosis found.  MDM  *I have reviewed nursing notes, vital signs, and all appropriate lab and imaging results for this patient.** Pt presents to ED with bilat leg  puffiness and back pain. UA non-acute. CBC non-acute, hepatic function reveals albumin 3.3(low), o/w wnl. B natriuretic 288, mildly elevated. Bmet - non-acute. No gross neuro deficit noted. Pain improved with robaxin and toradol, but not resolved. Pt to follow up with Dr Gerilyn Pilgrim in the office. She is to See Dr. Ninetta Lights for assistance with pain management.  Kathie Dike, PA-C 02/04/13 1305

## 2013-01-27 NOTE — ED Notes (Signed)
Pt alert & oriented x4, stable gait. Patient given discharge instructions, paperwork & prescription(s). Patient  instructed to stop at the registration desk to finish any additional paperwork. Patient verbalized understanding. Pt left department w/ no further questions. 

## 2013-01-27 NOTE — ED Notes (Signed)
Pt c/o ble swelling x 2 days. Denies sob/cp. Pt also c/o low back pain, states she had a shot in her back at Dr.Doonquah's office 8/16. Denies gi/gu sx. nad noted.

## 2013-02-05 NOTE — ED Provider Notes (Signed)
Medical screening examination/treatment/procedure(s) were performed by non-physician practitioner and as supervising physician I was immediately available for consultation/collaboration.  Efrem Pitstick L Audel Coakley, MD 02/05/13 0011 

## 2013-03-01 ENCOUNTER — Encounter (HOSPITAL_COMMUNITY): Payer: Self-pay

## 2013-03-01 ENCOUNTER — Emergency Department (HOSPITAL_COMMUNITY)
Admission: EM | Admit: 2013-03-01 | Discharge: 2013-03-01 | Disposition: A | Discharge status: Home / Self Care | Payer: Medicaid Other | Attending: Emergency Medicine | Admitting: Emergency Medicine

## 2013-03-01 DIAGNOSIS — Z79899 Other long term (current) drug therapy: Secondary | ICD-10-CM | POA: Insufficient documentation

## 2013-03-01 DIAGNOSIS — Z880 Allergy status to penicillin: Secondary | ICD-10-CM | POA: Insufficient documentation

## 2013-03-01 DIAGNOSIS — F17200 Nicotine dependence, unspecified, uncomplicated: Secondary | ICD-10-CM | POA: Insufficient documentation

## 2013-03-01 DIAGNOSIS — G8929 Other chronic pain: Secondary | ICD-10-CM | POA: Insufficient documentation

## 2013-03-01 DIAGNOSIS — M129 Arthropathy, unspecified: Secondary | ICD-10-CM | POA: Insufficient documentation

## 2013-03-01 DIAGNOSIS — G40909 Epilepsy, unspecified, not intractable, without status epilepticus: Secondary | ICD-10-CM | POA: Insufficient documentation

## 2013-03-01 DIAGNOSIS — M545 Low back pain, unspecified: Secondary | ICD-10-CM | POA: Insufficient documentation

## 2013-03-01 DIAGNOSIS — I10 Essential (primary) hypertension: Secondary | ICD-10-CM | POA: Insufficient documentation

## 2013-03-01 MED ORDER — CYCLOBENZAPRINE HCL 10 MG PO TABS: 10.0000 mg | ORAL_TABLET | Freq: Two times a day (BID) | ORAL | Status: DC | PRN

## 2013-03-01 MED ORDER — MELOXICAM 7.5 MG PO TABS: 7.5000 mg | ORAL_TABLET | Freq: Every day | ORAL | Status: DC

## 2013-03-01 MED ORDER — KETOROLAC TROMETHAMINE 60 MG/2ML IM SOLN
60.0000 mg | Freq: Once | INTRAMUSCULAR | Status: AC
  Administered 2013-03-01: 60 mg via INTRAMUSCULAR
  Filled 2013-03-01: qty 2

## 2013-03-01 NOTE — ED Provider Notes (Signed)
CSN: [id number]     Arrival date & time 03/01/13  4098 History   First MD Initiated Contact with Patient 03/01/13 [phone]     Chief Complaint  Patient presents with  . Back Pain   (Consider location/radiation/quality/duration/timing/severity/associated sxs/prior Treatment) Patient is a 47 y.o. female presenting with back pain. The history is provided by the patient.  Back Pain Location:  Lumbar spine Quality:  Shooting Radiates to:  L thigh Pain severity:  Severe (9/10) Pain is:  Same all the time Onset quality:  Gradual Duration:  12 months Timing:  Constant Progression:  Worsening Chronicity:  Chronic Context: not physical stress, not recent illness and not recent injury   Relieved by:  Nothing Ineffective treatments:  Ibuprofen, muscle relaxants, cold packs and heating pad Associated symptoms: no abdominal pain, no chest pain, no dysuria, no fever and no headaches    Judy Cross is a 47 y.o. female who presents to the ED with low back pain. She has a history of chronic back pain and a bulging disc in her lower back. She has been evaluated by Dr. Hilda Lias and has had one cortisone injection and is scheduled for another in October. She has been taking her medication she has for pain but is not helping. The pain has gotten much worse over the past 2 days.  She denies loss of control of bladder or bowels. The pain is the same as usual. She states that when it gets bad like this she usually comes in and gets a shot. She has hydrocodone and ibuprofen at home but doesn't think it works any more. Last took ibuprofen last night. Has not taken hydrocodone in the past 24 hours.   Past Medical History  Diagnosis Date  . 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 Hypertension   . Seizures     last seizure was a year ago  . Arthritis    Past Surgical History  Procedure Laterality Date  . Cesarean section      x 3  . Examination under anesthesia  02/28/2012    Procedure: EXAM UNDER  ANESTHESIA;  Surgeon: Fabio Bering, MD;  Location: [facility];  Service: General;  Laterality: N/A;  . Sphincterotomy  02/28/2012    Procedure: SPHINCTEROTOMY;  Surgeon: Fabio Bering, MD;  Location: [facility];  Service: General;  Laterality: N/A;  Lateral Internal Sphincterotomy   Family History  Problem Relation Age of Onset  . Hypertension Mother   . Hypertension Father    History  Substance Use Topics  . Smoking status: Current Every Day Smoker -- 0.50 packs/day for 20 years    Types: Cigarettes  . Smokeless tobacco: Never Used  . Alcohol Use: Yes     Comment: occasional   OB History   Grav Para Term Preterm Abortions TAB SAB Ect Mult Living                 Review of Systems  Constitutional: Negative for fever and chills.  HENT: Negative for neck pain.   Respiratory: Negative for chest tightness and shortness of breath.   Cardiovascular: Negative for chest pain.  Gastrointestinal: Negative for nausea, vomiting and abdominal pain.  Genitourinary: Negative for dysuria, urgency and frequency.  Musculoskeletal: Positive for back pain.  Skin: Negative for rash.  Neurological: Negative for dizziness and headaches.  Psychiatric/Behavioral: The patient is not nervous/anxious.     Allergies  Penicillins  Home Medications   Current Outpatient Rx  Name  Route  Sig  Dispense  Refill  . albuterol (PROVENTIL HFA;VENTOLIN HFA) 108 (90 BASE) MCG/ACT inhaler   Inhalation   Inhale 2 puffs into the lungs at bedtime.         . diclofenac (VOLTAREN) 75 MG EC tablet   Oral   Take 1 tablet (75 mg total) by mouth 2 (two) times daily.   12 tablet   0   . gabapentin (NEURONTIN) 300 MG capsule   Oral   Take 300 mg by mouth daily.         . hydrochlorothiazide 25 MG tablet   Oral   Take 25 mg by mouth daily.          Marland Kitchen ibuprofen (ADVIL,MOTRIN) 800 MG tablet   Oral   Take 800 mg by mouth 3 (three) times daily. FOR PAIN         . levETIRAcetam (KEPPRA) 500 MG tablet    Oral   Take 500 mg by mouth 2 (two) times daily.          Marland Kitchen lisinopril (PRINIVIL,ZESTRIL) 20 MG tablet   Oral   Take 10 mg by mouth daily.          Marland Kitchen loratadine (CLARITIN) 10 MG tablet   Oral   Take 10 mg by mouth daily.         . methocarbamol (ROBAXIN) 500 MG tablet   Oral   Take 500 mg by mouth 4 (four) times daily as needed (Muscle Spasms).           BP 136/97  Pulse 65  Temp(Src) 99.3 F (37.4 C) (Oral)  Resp 18  SpO2 100%  LMP 02/28/2013 Physical Exam  Nursing note and vitals reviewed. Constitutional: She is oriented to person, place, and time. She appears well-developed and well-nourished.  HENT:  Head: Normocephalic and atraumatic.  Right Ear: Tympanic membrane normal.  Left Ear: Tympanic membrane normal.  Mouth/Throat: Uvula is midline, oropharynx is clear and moist and mucous membranes are normal.  Eyes: Conjunctivae and EOM are normal.  Neck: Neck supple.  Cardiovascular: Normal rate, regular rhythm and normal heart sounds.   Pulmonary/Chest: Effort normal and breath sounds normal.  Abdominal: Soft. Bowel sounds are normal. There is no tenderness.  Musculoskeletal:  Radial pulses present, adequate circulation, good touch sensation. Grips equal bilateral.  Pedal pulses present and equal. Adequate circulation, good touch sensation, although patient states that pain radiates down right leg but left leg feels numb. Ambulatory without difficulty.  Neurological: She is alert and oriented to person, place, and time. She has normal strength and normal reflexes. No cranial nerve deficit or sensory deficit. Abnormal gait: due to pain. no foot drag.  Reflex Scores:      Bicep reflexes are 2+ on the right side and 2+ on the left side.      Brachioradialis reflexes are 2+ on the right side and 2+ on the left side.      Patellar reflexes are 2+ on the right side and 2+ on the left side. Straight leg raises without difficlty  Skin: Skin is warm and dry.  Psychiatric:  She has a normal mood and affect. Her behavior is normal.    ED Course: [phone] patient feeling better after Toradol 60 mg.   Procedures   MDM  46 y.o. female with chronic low back pain. History of bulging disc, followed by Dr. Hilda Lias. Will treat with Flexeril and Mobic. Patient stable for discharge home to follow up with Dr. Hilda Lias. She remains  neurovascularly intact. Vital signs, elevated blood pressure, will recheck prior to d/c. Patient will return here as needed for worsening symptoms. She will follow up with her PCP for her blood pressure.    [facility] Orlene Och, [place] 03/01/13 1012

## 2013-03-01 NOTE — ED Notes (Signed)
Pt alert & oriented x4, stable gait. Patient given discharge instructions, paperwork & prescription(s). Patient  instructed to stop at the registration desk to finish any additional paperwork. Patient verbalized understanding. Pt left department w/ no further questions. 

## 2013-03-01 NOTE — ED Provider Notes (Signed)
Medical screening examination/treatment/procedure(s) were performed by non-physician practitioner and as supervising physician I was immediately available for consultation/collaboration.   Lyanne Co, MD 03/01/13 1018

## 2013-03-01 NOTE — ED Notes (Signed)
Pt reports having a bulging disc, back pain returned last night she stated that the pain returned last night (with the weather changing),denies any recent injury. Has had injections in the past.

## 2013-03-12 ENCOUNTER — Other Ambulatory Visit (HOSPITAL_COMMUNITY): Payer: Self-pay | Admitting: Orthopaedic Surgery

## 2013-03-12 DIAGNOSIS — M545 Low back pain, unspecified: Secondary | ICD-10-CM

## 2013-03-14 ENCOUNTER — Ambulatory Visit (HOSPITAL_COMMUNITY): Payer: Medicaid Other

## 2013-03-17 ENCOUNTER — Ambulatory Visit (HOSPITAL_COMMUNITY)
Admission: RE | Admit: 2013-03-17 | Discharge: 2013-03-17 | Disposition: A | Discharge status: Home / Self Care | Payer: Medicaid Other | Source: Ambulatory Visit | Attending: Orthopaedic Surgery | Admitting: Orthopaedic Surgery

## 2013-03-17 ENCOUNTER — Encounter (HOSPITAL_COMMUNITY): Payer: Self-pay

## 2013-03-17 DIAGNOSIS — M545 Low back pain, unspecified: Secondary | ICD-10-CM | POA: Insufficient documentation

## 2013-03-17 DIAGNOSIS — M47817 Spondylosis without myelopathy or radiculopathy, lumbosacral region: Secondary | ICD-10-CM | POA: Insufficient documentation

## 2013-03-17 DIAGNOSIS — M79609 Pain in unspecified limb: Secondary | ICD-10-CM | POA: Insufficient documentation

## 2013-03-17 DIAGNOSIS — M5137 Other intervertebral disc degeneration, lumbosacral region: Secondary | ICD-10-CM | POA: Insufficient documentation

## 2013-03-17 DIAGNOSIS — R209 Unspecified disturbances of skin sensation: Secondary | ICD-10-CM | POA: Insufficient documentation

## 2013-03-17 DIAGNOSIS — M51379 Other intervertebral disc degeneration, lumbosacral region without mention of lumbar back pain or lower extremity pain: Secondary | ICD-10-CM | POA: Insufficient documentation

## 2013-03-26 ENCOUNTER — Encounter: Payer: Self-pay | Admitting: Adult Health

## 2013-03-26 ENCOUNTER — Ambulatory Visit (INDEPENDENT_AMBULATORY_CARE_PROVIDER_SITE_OTHER): Payer: Medicaid Other | Admitting: Adult Health

## 2013-03-26 VITALS — BP 110/80 | Ht 61.0 in | Wt 159.0 lb

## 2013-03-26 DIAGNOSIS — N926 Irregular menstruation, unspecified: Secondary | ICD-10-CM

## 2013-03-26 DIAGNOSIS — N852 Hypertrophy of uterus: Secondary | ICD-10-CM

## 2013-03-26 DIAGNOSIS — N951 Menopausal and female climacteric states: Secondary | ICD-10-CM

## 2013-03-26 DIAGNOSIS — R232 Flushing: Secondary | ICD-10-CM

## 2013-03-26 HISTORY — DX: Flushing: R23.2

## 2013-03-26 HISTORY — DX: Hypertrophy of uterus: N85.2

## 2013-03-26 HISTORY — DX: Irregular menstruation, unspecified: N92.6

## 2013-03-26 LAB — COMPREHENSIVE METABOLIC PANEL
ALT: 15 U/L (ref 0–35)
AST: 16 U/L (ref 0–37)
Albumin: 4.5 g/dL (ref 3.5–5.2)
Alkaline Phosphatase: 45 U/L (ref 39–117)
BUN: 22 mg/dL (ref 6–23)
CO2: 26 mEq/L (ref 19–32)
Calcium: 9.6 mg/dL (ref 8.4–10.5)
Chloride: 104 mEq/L (ref 96–112)
Creat: 1.16 mg/dL — ABNORMAL HIGH (ref 0.50–1.10)
Glucose, Bld: 96 mg/dL (ref 70–99)
Potassium: 3.7 mEq/L (ref 3.5–5.3)
Sodium: 137 mEq/L (ref 135–145)
Total Bilirubin: 0.7 mg/dL (ref 0.3–1.2)
Total Protein: 7.2 g/dL (ref 6.0–8.3)

## 2013-03-26 LAB — CBC
HCT: 40 % (ref 36.0–46.0)
Hemoglobin: 13.9 g/dL (ref 12.0–15.0)
MCH: 32.3 pg (ref 26.0–34.0)
MCHC: 34.8 g/dL (ref 30.0–36.0)
MCV: 92.8 fL (ref 78.0–100.0)
Platelets: 295 10*3/uL (ref 150–400)
RBC: 4.31 MIL/uL (ref 3.87–5.11)
RDW: 14 % (ref 11.5–15.5)
WBC: 7 10*3/uL (ref 4.0–10.5)

## 2013-03-26 LAB — TSH: TSH: 0.288 u[IU]/mL — ABNORMAL LOW (ref 0.350–4.500)

## 2013-03-26 NOTE — Patient Instructions (Signed)

## 2013-03-26 NOTE — Progress Notes (Signed)
Subjective:     Patient ID: Judy Cross, female   DOB: 07-Oct-1965, 47 y.o.   MRN: [id number]  HPI Judy Cross is a 47 year old black female in complaining of no period x 6 months then bleeding again and this last time has been on a months and she is having hot flashes.  Review of Systems See HPI Reviewed past medical,surgical, social and family history. Reviewed medications and allergies.     Objective:   Physical Exam BP 110/80  Ht 5\' 1"  (1.549 m)  Wt 159 lb (72.122 kg)  BMI 30.06 kg/m2  LMP 02/28/2013   Skin warm and dry.Pelvic: external genitalia is normal in appearance, vagina: white period like blood without odor, cervix:smooth and bulbous, uterus:enlarged ?fibroid felt, non tender, no masses felt, adnexa: no masses or tenderness noted.Discussed this could be menopause.  Assessment:     Irregular bleeding Hot flashes Enlarged uterus    Plan:     Return in 1 week for pelvic US and see me to discuss options Check CBC,CMP,TSH and [facility] today Review handout on menopause

## 2013-03-27 LAB — FOLLICLE STIMULATING HORMONE: FSH: 34.5 m[IU]/mL

## 2013-03-31 ENCOUNTER — Emergency Department (HOSPITAL_COMMUNITY): Payer: Medicaid Other

## 2013-03-31 ENCOUNTER — Encounter (HOSPITAL_COMMUNITY): Payer: Self-pay | Admitting: Emergency Medicine

## 2013-03-31 ENCOUNTER — Emergency Department (HOSPITAL_COMMUNITY)
Admission: EM | Admit: 2013-03-31 | Discharge: 2013-03-31 | Disposition: A | Discharge status: Home / Self Care | Payer: Medicaid Other | Attending: Emergency Medicine | Admitting: Emergency Medicine

## 2013-03-31 ENCOUNTER — Telehealth: Payer: Self-pay | Admitting: Adult Health

## 2013-03-31 DIAGNOSIS — I10 Essential (primary) hypertension: Secondary | ICD-10-CM | POA: Insufficient documentation

## 2013-03-31 DIAGNOSIS — Z791 Long term (current) use of non-steroidal anti-inflammatories (NSAID): Secondary | ICD-10-CM | POA: Insufficient documentation

## 2013-03-31 DIAGNOSIS — Z8742 Personal history of other diseases of the female genital tract: Secondary | ICD-10-CM | POA: Insufficient documentation

## 2013-03-31 DIAGNOSIS — F17200 Nicotine dependence, unspecified, uncomplicated: Secondary | ICD-10-CM | POA: Insufficient documentation

## 2013-03-31 DIAGNOSIS — Z79899 Other long term (current) drug therapy: Secondary | ICD-10-CM | POA: Insufficient documentation

## 2013-03-31 DIAGNOSIS — Z880 Allergy status to penicillin: Secondary | ICD-10-CM | POA: Insufficient documentation

## 2013-03-31 DIAGNOSIS — M129 Arthropathy, unspecified: Secondary | ICD-10-CM | POA: Insufficient documentation

## 2013-03-31 DIAGNOSIS — G40909 Epilepsy, unspecified, not intractable, without status epilepticus: Secondary | ICD-10-CM | POA: Insufficient documentation

## 2013-03-31 DIAGNOSIS — M25562 Pain in left knee: Secondary | ICD-10-CM

## 2013-03-31 DIAGNOSIS — M25569 Pain in unspecified knee: Secondary | ICD-10-CM | POA: Insufficient documentation

## 2013-03-31 MED ORDER — TRAMADOL HCL 50 MG PO TABS: 50.0000 mg | ORAL_TABLET | Freq: Four times a day (QID) | ORAL | Status: DC | PRN

## 2013-03-31 MED ORDER — CYCLOBENZAPRINE HCL 10 MG PO TABS: 10.0000 mg | ORAL_TABLET | Freq: Three times a day (TID) | ORAL | Status: DC | PRN

## 2013-03-31 NOTE — ED Notes (Signed)
Pain behind L knee, "feels like a ligament is pulling".  Stretching type pain.  Has taken ibuprofen and iced it w/minimal relief.  Also c/o L hip pain which is intermittent and chronic from prior back problems.

## 2013-03-31 NOTE — Telephone Encounter (Signed)
No voice mail.

## 2013-04-01 ENCOUNTER — Telehealth: Payer: Self-pay | Admitting: Adult Health

## 2013-04-01 NOTE — Telephone Encounter (Signed)
Phone number not in service

## 2013-04-02 ENCOUNTER — Encounter: Payer: Self-pay | Admitting: *Deleted

## 2013-04-02 ENCOUNTER — Ambulatory Visit: Payer: Medicaid Other | Admitting: Adult Health

## 2013-04-02 ENCOUNTER — Other Ambulatory Visit: Payer: Medicaid Other

## 2013-04-02 NOTE — ED Provider Notes (Signed)
CSN: [id number]     Arrival date & time 03/31/13  1008 History   First MD Initiated Contact with Patient 03/31/13 1059     Chief Complaint  Patient presents with  . Leg Pain   (Consider location/radiation/quality/duration/timing/severity/associated sxs/prior Treatment) Patient is a 47 y.o. female presenting with leg pain. The history is provided by the patient.  Leg Pain Location:  Knee and leg Time since incident:  2 days Injury: no   Leg location:  L lower leg Knee location:  L knee Pain details:    Quality:  Aching (pulling sensation)   Radiates to:  Does not radiate   Severity:  Severe   Onset quality:  Gradual   Timing:  Constant   Progression:  Worsening Chronicity:  New Dislocation: no   Prior injury to area:  No Relieved by:  Rest Worsened by:  Activity, bearing weight, flexion and extension Ineffective treatments:  None tried Associated symptoms: no back pain, no decreased ROM, no fatigue, no fever, no muscle weakness, no neck pain, no numbness, no stiffness, no swelling and no tingling     Past Medical History  Diagnosis Date  . 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 Hypertension   . Seizures     last seizure was a year ago  . Arthritis   . Irregular bleeding 03/26/2013  . Hot flashes 03/26/2013   Past Surgical History  Procedure Laterality Date  . Cesarean section      x 3  . Examination under anesthesia  02/28/2012    Procedure: EXAM UNDER ANESTHESIA;  Surgeon: Fabio Bering, MD;  Location: [facility];  Service: General;  Laterality: N/A;  . Sphincterotomy  02/28/2012    Procedure: SPHINCTEROTOMY;  Surgeon: Fabio Bering, MD;  Location: [facility];  Service: General;  Laterality: N/A;  Lateral Internal Sphincterotomy  . Trigger finger release    . Ganglion cyst excision    . Tubal ligation     Family History  Problem Relation Age of Onset  . Hypertension Mother   . Hypertension Father    History  Substance Use Topics  . Smoking status: Current  Every Day Smoker -- 4.00 packs/day for 20 years    Types: Cigarettes  . Smokeless tobacco: Never Used  . Alcohol Use: Yes     Comment: occasional   OB History   Grav Para Term Preterm Abortions TAB SAB Ect Mult Living   4 3   1  1   3      Review of Systems  Constitutional: Negative for fever, chills and fatigue.  Genitourinary: Negative for dysuria and difficulty urinating.  Musculoskeletal: Positive for arthralgias. Negative for back pain, joint swelling, neck pain and stiffness.  Skin: Negative for color change and wound.  All other systems reviewed and are negative.    Allergies  Penicillins  Home Medications   Current Outpatient Rx  Name  Route  Sig  Dispense  Refill  . albuterol (PROVENTIL HFA;VENTOLIN HFA) 108 (90 BASE) MCG/ACT inhaler   Inhalation   Inhale 2 puffs into the lungs at bedtime.         . cyclobenzaprine (FLEXERIL) 10 MG tablet   Oral   Take 1 tablet (10 mg total) by mouth 2 (two) times daily as needed for muscle spasms.   20 tablet   0   . gabapentin (NEURONTIN) 300 MG capsule   Oral   Take 300 mg by mouth daily.         Marland Kitchen  hydrochlorothiazide 25 MG tablet   Oral   Take 25 mg by mouth daily.          Marland Kitchen levETIRAcetam (KEPPRA) 500 MG tablet   Oral   Take 500 mg by mouth 2 (two) times daily.          Marland Kitchen lisinopril (PRINIVIL,ZESTRIL) 20 MG tablet   Oral   Take 10 mg by mouth daily.          Marland Kitchen loratadine (CLARITIN) 10 MG tablet   Oral   Take 10 mg by mouth daily.         . meloxicam (MOBIC) 7.5 MG tablet   Oral   Take 1 tablet (7.5 mg total) by mouth daily.   10 tablet   0   . cyclobenzaprine (FLEXERIL) 10 MG tablet   Oral   Take 1 tablet (10 mg total) by mouth 3 (three) times daily as needed.   21 tablet   0   . traMADol (ULTRAM) 50 MG tablet   Oral   Take 1 tablet (50 mg total) by mouth every 6 (six) hours as needed for pain.   15 tablet   0    BP 126/97  Pulse 66  Temp(Src) 98.6 F (37 C) (Oral)  Resp 16  Ht  5\' 1"  (1.549 m)  Wt 156 lb (70.761 kg)  BMI 29.49 kg/m2  SpO2 100%  LMP 02/28/2013 Physical Exam  Nursing note and vitals reviewed. Constitutional: She is oriented to person, place, and time. She appears well-developed and well-nourished. No distress.  Cardiovascular: Normal rate, regular rhythm, normal heart sounds and intact distal pulses.   Pulmonary/Chest: Effort normal and breath sounds normal.  Musculoskeletal: She exhibits tenderness. She exhibits no edema.       Left lower leg: She exhibits tenderness. She exhibits no bony tenderness, no swelling, no edema and no deformity.       Legs: ttp of the popliteal fossa of the left knee and upper posterior calf.  No erythema, effusion, or step-off deformity, no edema or erythema of the left calf.  DP pulse brisk, distal sensation intact.   Neurological: She is alert and oriented to person, place, and time. She exhibits normal muscle tone. Coordination normal.  Skin: Skin is warm and dry. No erythema.    ED Course  Procedures (including critical care time) Labs Review Labs Reviewed - No data to display Imaging Review  US Venous Img Lower Unilateral Left  03/31/2013   CLINICAL DATA:  Left leg pain  EXAM: VENOUS DOPPLER ULTRASOUND OF LEFT LOWER EXTREMITY  TECHNIQUE: Gray-scale sonography with graded compression, as well as color Doppler and duplex ultrasound, were performed to evaluate the deep venous system from the level of the common femoral vein through the popliteal and proximal calf veins. Spectral Doppler was utilized to evaluate flow at rest and with distal augmentation maneuvers.  COMPARISON:  None.  FINDINGS: Thrombus within deep veins:  None visualized.  Compressibility of deep veins:  Normal.  Duplex waveform respiratory phasicity:  Normal.  Duplex waveform response to augmentation:  Normal.  Venous reflux:  None visualized.  IMPRESSION: No evidence of deep venous thrombosis.  There   Electronically Signed   By: Genevive Bi  M.D.   On: 03/31/2013 13:01    EKG Interpretation   None       MDM   1. Knee pain, acute, left   2. Lower leg pain, left    [place] results reviewed and discussed with the patient.  Patient also had recent MRI of the L spine.  discussed possible Bakers cyst with patient.  Pt ambulates w/o difficulty.  Advised to f/u with her PMD or orthopedics, referral info given.  Pt agrees to care plan.  She appears stable for discharge.    Saiquan Hands L. Caryl Manas, PA-C 04/02/13 1229

## 2013-04-08 ENCOUNTER — Encounter (HOSPITAL_COMMUNITY): Payer: Self-pay | Admitting: Emergency Medicine

## 2013-04-08 ENCOUNTER — Emergency Department (HOSPITAL_COMMUNITY)
Admission: EM | Admit: 2013-04-08 | Discharge: 2013-04-08 | Disposition: A | Discharge status: Home / Self Care | Payer: Medicaid Other | Attending: Emergency Medicine | Admitting: Emergency Medicine

## 2013-04-08 ENCOUNTER — Emergency Department (HOSPITAL_COMMUNITY): Payer: Medicaid Other

## 2013-04-08 DIAGNOSIS — G40909 Epilepsy, unspecified, not intractable, without status epilepticus: Secondary | ICD-10-CM | POA: Insufficient documentation

## 2013-04-08 DIAGNOSIS — R0602 Shortness of breath: Secondary | ICD-10-CM | POA: Insufficient documentation

## 2013-04-08 DIAGNOSIS — Z880 Allergy status to penicillin: Secondary | ICD-10-CM | POA: Insufficient documentation

## 2013-04-08 DIAGNOSIS — I10 Essential (primary) hypertension: Secondary | ICD-10-CM | POA: Insufficient documentation

## 2013-04-08 DIAGNOSIS — M129 Arthropathy, unspecified: Secondary | ICD-10-CM | POA: Insufficient documentation

## 2013-04-08 DIAGNOSIS — Z79899 Other long term (current) drug therapy: Secondary | ICD-10-CM | POA: Insufficient documentation

## 2013-04-08 DIAGNOSIS — R071 Chest pain on breathing: Secondary | ICD-10-CM | POA: Insufficient documentation

## 2013-04-08 DIAGNOSIS — F17200 Nicotine dependence, unspecified, uncomplicated: Secondary | ICD-10-CM | POA: Insufficient documentation

## 2013-04-08 DIAGNOSIS — E876 Hypokalemia: Secondary | ICD-10-CM | POA: Insufficient documentation

## 2013-04-08 DIAGNOSIS — Z8742 Personal history of other diseases of the female genital tract: Secondary | ICD-10-CM | POA: Insufficient documentation

## 2013-04-08 DIAGNOSIS — R0789 Other chest pain: Secondary | ICD-10-CM

## 2013-04-08 LAB — CBC WITH DIFFERENTIAL/PLATELET
Basophils Absolute: 0 10*3/uL (ref 0.0–0.1)
Basophils Relative: 0 % (ref 0–1)
Eosinophils Absolute: 0.1 10*3/uL (ref 0.0–0.7)
Eosinophils Relative: 2 % (ref 0–5)
HCT: 38.8 % (ref 36.0–46.0)
Hemoglobin: 13.4 g/dL (ref 12.0–15.0)
Lymphocytes Relative: 43 % (ref 12–46)
Lymphs Abs: 2.3 10*3/uL (ref 0.7–4.0)
MCH: 32.4 pg (ref 26.0–34.0)
MCHC: 34.5 g/dL (ref 30.0–36.0)
MCV: 93.9 fL (ref 78.0–100.0)
Monocytes Absolute: 0.3 10*3/uL (ref 0.1–1.0)
Monocytes Relative: 6 % (ref 3–12)
Neutro Abs: 2.7 10*3/uL (ref 1.7–7.7)
Neutrophils Relative %: 49 % (ref 43–77)
Platelets: 272 10*3/uL (ref 150–400)
RBC: 4.13 MIL/uL (ref 3.87–5.11)
RDW: 13.5 % (ref 11.5–15.5)
WBC: 5.4 10*3/uL (ref 4.0–10.5)

## 2013-04-08 LAB — BASIC METABOLIC PANEL
BUN: 12 mg/dL (ref 6–23)
CO2: 25 mEq/L (ref 19–32)
Calcium: 9.2 mg/dL (ref 8.4–10.5)
Chloride: 104 mEq/L (ref 96–112)
Creatinine, Ser: 0.98 mg/dL (ref 0.50–1.10)
GFR calc Af Amer: 78 mL/min — ABNORMAL LOW (ref >90)
GFR calc non Af Amer: 67 mL/min — ABNORMAL LOW (ref >90)
Glucose, Bld: 115 mg/dL — ABNORMAL HIGH (ref 70–99)
Potassium: 3.1 mEq/L — ABNORMAL LOW (ref 3.5–5.1)
Sodium: 140 mEq/L (ref 135–145)

## 2013-04-08 LAB — TROPONIN I: Troponin I: <0.3 ng/mL (ref <0.30)

## 2013-04-08 LAB — D-DIMER, QUANTITATIVE: D-Dimer, Quant: <0.27 ug/mL-FEU (ref 0.00–0.48)

## 2013-04-08 MED ORDER — POTASSIUM CHLORIDE ER 10 MEQ PO TBCR: 20.0000 meq | EXTENDED_RELEASE_TABLET | Freq: Two times a day (BID) | ORAL | Status: DC

## 2013-04-08 MED ORDER — HYDROCODONE-ACETAMINOPHEN 5-325 MG PO TABS: 1.0000 | ORAL_TABLET | ORAL | Status: DC | PRN

## 2013-04-08 MED ORDER — POTASSIUM CHLORIDE CRYS ER 20 MEQ PO TBCR
40.0000 meq | EXTENDED_RELEASE_TABLET | Freq: Once | ORAL | Status: AC
  Administered 2013-04-08: 40 meq via ORAL
  Filled 2013-04-08: qty 2

## 2013-04-08 MED ORDER — ASPIRIN 81 MG PO CHEW
324.0000 mg | CHEWABLE_TABLET | Freq: Once | ORAL | Status: AC
  Administered 2013-04-08: 324 mg via ORAL
  Filled 2013-04-08: qty 4

## 2013-04-08 MED ORDER — KETOROLAC TROMETHAMINE 30 MG/ML IJ SOLN
30.0000 mg | Freq: Once | INTRAMUSCULAR | Status: AC
  Administered 2013-04-08: 30 mg via INTRAVENOUS
  Filled 2013-04-08: qty 1

## 2013-04-08 NOTE — ED Notes (Signed)
Pt c/o pain under left chest that is tight and aching intermittent since yesterday. "little" sob when pain hits, pain lasts approx few seconds per pt. Pt tearful at this time. Denies dizziness. Denies radiation. Pain worse with moving around. Intermittent non prod cough.

## 2013-04-08 NOTE — ED Provider Notes (Signed)
CSN: [id number]     Arrival date & time 04/08/13  0818 History   First MD Initiated Contact with Patient 04/08/13 0840     Chief Complaint  Patient presents with  . Chest Pain   (Consider location/radiation/quality/duration/timing/severity/associated sxs/prior Treatment) HPI Comments: Judy Cross is a 47 y.o. Female presenting with left sided chest pain, described as sharp, tight and aching, intermittent which started around 6 PM yesterday as she was finishing her evening meal.  She describes episodes of pain lasting for seconds approximately every 5 minutes and has been persistent throughout the night.  She reports feeling a little short of breath when the pain hits, but is asymptomatic between these episodes.  The pain is reproducible with movement of her left arm and also with palpation of her left chest wall beneath her left breast, but the stabs of pain occur spontaneously while at rest.  She denies abdominal pain, nausea or emesis, denies history of acid reflux, no prior history of cardiac illness.  She is treated for hypertension, but has not taken any of her medications this morning.  She has found no alleviators for this pain.  She was seen here last week for left lower extremity pain and underwent a Doppler ultrasound which was negative for DVT.     The history is provided by the patient.    Past Medical History  Diagnosis Date  . 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 Hypertension   . Seizures     last seizure was a year ago  . Arthritis   . Irregular bleeding 03/26/2013  . Hot flashes 03/26/2013   Past Surgical History  Procedure Laterality Date  . Cesarean section      x 3  . Examination under anesthesia  02/28/2012    Procedure: EXAM UNDER ANESTHESIA;  Surgeon: Fabio Bering, MD;  Location: [facility];  Service: General;  Laterality: N/A;  . Sphincterotomy  02/28/2012    Procedure: SPHINCTEROTOMY;  Surgeon: Fabio Bering, MD;  Location: [facility];  Service:  General;  Laterality: N/A;  Lateral Internal Sphincterotomy  . Trigger finger release    . Ganglion cyst excision    . Tubal ligation     Family History  Problem Relation Age of Onset  . Hypertension Mother   . Hypertension Father    History  Substance Use Topics  . Smoking status: Current Every Day Smoker -- 4.00 packs/day for 20 years    Types: Cigarettes  . Smokeless tobacco: Never Used  . Alcohol Use: Yes     Comment: occasional   OB History   Grav Para Term Preterm Abortions TAB SAB Ect Mult Living   4 3   1  1   3      Review of Systems  Constitutional: Negative for fever.  HENT: Negative for congestion and sore throat.   Eyes: Negative.   Respiratory: Positive for shortness of breath. Negative for chest tightness and wheezing.   Cardiovascular: Positive for chest pain.  Gastrointestinal: Negative for nausea and abdominal pain.  Genitourinary: Negative.   Musculoskeletal: Negative for arthralgias, joint swelling and neck pain.  Skin: Negative.  Negative for rash and wound.  Neurological: Negative for dizziness, weakness, light-headedness, numbness and headaches.  Psychiatric/Behavioral: Negative.     Allergies  Penicillins  Home Medications   Current Outpatient Rx  Name  Route  Sig  Dispense  Refill  . albuterol (PROVENTIL HFA;VENTOLIN HFA) 108 (90 BASE) MCG/ACT inhaler   Inhalation  Inhale 2 puffs into the lungs at bedtime.         . gabapentin (NEURONTIN) 300 MG capsule   Oral   Take 300 mg by mouth daily as needed.          . hydrochlorothiazide 25 MG tablet   Oral   Take 25 mg by mouth daily.          Marland Kitchen ibuprofen (ADVIL,MOTRIN) 800 MG tablet   Oral   Take 800 mg by mouth every 8 (eight) hours as needed for pain.         Marland Kitchen levETIRAcetam (KEPPRA) 500 MG tablet   Oral   Take 500 mg by mouth 2 (two) times daily.          Marland Kitchen lisinopril (PRINIVIL,ZESTRIL) 20 MG tablet   Oral   Take 10 mg by mouth daily.          Marland Kitchen loratadine  (CLARITIN) 10 MG tablet   Oral   Take 10 mg by mouth daily.         . traMADol (ULTRAM) 50 MG tablet   Oral   Take 1 tablet (50 mg total) by mouth every 6 (six) hours as needed for pain.   15 tablet   0   . cyclobenzaprine (FLEXERIL) 10 MG tablet   Oral   Take 1 tablet (10 mg total) by mouth 3 (three) times daily as needed.   21 tablet   0   . HYDROcodone-acetaminophen (NORCO/VICODIN) 5-325 MG per tablet   Oral   Take 1 tablet by mouth every 4 (four) hours as needed for pain.   15 tablet   0   . potassium chloride (K-DUR) 10 MEQ tablet   Oral   Take 2 tablets (20 mEq total) by mouth 2 (two) times daily.   10 tablet   0    BP 151/92  Pulse 50  Temp(Src) 98.4 F (36.9 C) (Oral)  Resp 30  Ht 5\' 1"  (1.549 m)  Wt 156 lb (70.761 kg)  BMI 29.49 kg/m2  SpO2 100%  LMP 02/28/2013 Physical Exam  Nursing note and vitals reviewed. Constitutional: She appears well-developed and well-nourished.  tearful  HENT:  Head: Normocephalic and atraumatic.  Eyes: Conjunctivae are normal.  Neck: Normal range of motion.  Cardiovascular: Normal rate, regular rhythm, normal heart sounds and intact distal pulses.   Pulmonary/Chest: Effort normal and breath sounds normal. She has no wheezes. She has no rales. She exhibits tenderness.    Abdominal: Soft. Bowel sounds are normal. There is no tenderness.  Musculoskeletal: Normal range of motion.  Neurological: She is alert.  Skin: Skin is warm and dry.  Psychiatric: She has a normal mood and affect.    ED Course  Procedures (including critical care time) Labs Review Labs Reviewed  BASIC METABOLIC PANEL - Abnormal; Notable for the following:    Potassium 3.1 (*)    Glucose, Bld 115 (*)    GFR calc non Af Amer 67 (*)    GFR calc Af Amer 78 (*)    All other components within normal limits  CBC WITH DIFFERENTIAL  TROPONIN I  D-DIMER, QUANTITATIVE   Imaging Review Dg Chest Portable 1 View  04/08/2013   CLINICAL DATA:  Chest pain   EXAM: PORTABLE CHEST - 1 VIEW  COMPARISON:  None.  FINDINGS: The heart size and mediastinal contours are within normal limits. Both lungs are clear. The visualized skeletal structures are unremarkable.  IMPRESSION: No active disease.   Electronically  Signed   By: Charlett Nose M.D.   On: 04/08/2013 08:49    EKG Interpretation     Ventricular Rate:  50 PR Interval:  186 QRS Duration: 84 QT Interval:  430 QTC Calculation: 392 R Axis:   40 Text Interpretation:  Sinus bradycardia Otherwise normal ECG When compared with ECG of 26-Feb-2012 07:52, No significant change was found            MDM   1. Chest wall pain   2. Hypokalemia     Patients labs and/or radiological studies were viewed and considered during the medical decision making and disposition process. Pt discussed with Dr. Deretha Emory prior to dispo home.  She was prescribed hydrocodone for chest pain which is reproducible on exam,  Most suggestive of chest wall pain.  Troponin greater than 12 hours from first pain negative,  D dimer negative.  Prescribed potassium for decreased k+, diet info given.  Recheck by pcp this week.    Burgess Amor, PA-C 04/08/13 1102

## 2013-04-08 NOTE — ED Provider Notes (Signed)
Medical screening examination/treatment/procedure(s) were performed by non-physician practitioner and as supervising physician I was immediately available for consultation/collaboration.  EKG Interpretation   None        Jamez Ambrocio, MD 04/08/13 1727 

## 2013-04-08 NOTE — ED Notes (Signed)
Pt c/o inermittent left side chest pain since last night. Pt describes pain as "deep and sore". Pt states pain becomes worse with deep breathing. Pain radiates to back. Pt denies any injury to area.

## 2013-04-09 NOTE — ED Provider Notes (Signed)
Medical screening examination/treatment/procedure(s) were performed by non-physician practitioner and as supervising physician I was immediately available for consultation/collaboration.  EKG Interpretation     Ventricular Rate:  50 PR Interval:  186 QRS Duration: 84 QT Interval:  430 QTC Calculation: 392 R Axis:   40 Text Interpretation:  Sinus bradycardia Otherwise normal ECG When compared with ECG of 26-Feb-2012 07:52, No significant change was found              Shelda Jakes, MD 04/09/13 [phone]

## 2013-04-16 ENCOUNTER — Ambulatory Visit: Payer: Medicaid Other | Admitting: Orthopedic Surgery

## 2013-04-16 ENCOUNTER — Encounter: Payer: Self-pay | Admitting: Orthopedic Surgery

## 2013-04-22 ENCOUNTER — Ambulatory Visit: Payer: Medicaid Other | Admitting: Adult Health

## 2013-04-22 ENCOUNTER — Other Ambulatory Visit: Payer: Medicaid Other

## 2013-04-28 ENCOUNTER — Encounter (INDEPENDENT_AMBULATORY_CARE_PROVIDER_SITE_OTHER): Payer: Self-pay

## 2013-04-28 ENCOUNTER — Encounter: Payer: Self-pay | Admitting: Adult Health

## 2013-04-28 ENCOUNTER — Other Ambulatory Visit: Payer: Self-pay | Admitting: Adult Health

## 2013-04-28 ENCOUNTER — Ambulatory Visit (INDEPENDENT_AMBULATORY_CARE_PROVIDER_SITE_OTHER): Payer: Medicaid Other | Admitting: Adult Health

## 2013-04-28 ENCOUNTER — Ambulatory Visit (INDEPENDENT_AMBULATORY_CARE_PROVIDER_SITE_OTHER): Payer: Medicaid Other

## 2013-04-28 VITALS — BP 120/80 | Ht 61.0 in | Wt 164.0 lb

## 2013-04-28 DIAGNOSIS — N83209 Unspecified ovarian cyst, unspecified side: Secondary | ICD-10-CM

## 2013-04-28 DIAGNOSIS — N852 Hypertrophy of uterus: Secondary | ICD-10-CM

## 2013-04-28 DIAGNOSIS — D219 Benign neoplasm of connective and other soft tissue, unspecified: Secondary | ICD-10-CM

## 2013-04-28 DIAGNOSIS — D259 Leiomyoma of uterus, unspecified: Secondary | ICD-10-CM

## 2013-04-28 DIAGNOSIS — N840 Polyp of corpus uteri: Secondary | ICD-10-CM

## 2013-04-28 DIAGNOSIS — N926 Irregular menstruation, unspecified: Secondary | ICD-10-CM

## 2013-04-28 HISTORY — DX: Polyp of corpus uteri: N84.0

## 2013-04-28 HISTORY — DX: Benign neoplasm of connective and other soft tissue, unspecified: D21.9

## 2013-04-28 NOTE — Patient Instructions (Signed)
Endometrial Ablation Endometrial ablation removes the lining of the uterus (endometrium). It is usually a same-day, outpatient treatment. Ablation helps avoid major surgery, such as surgery to remove the cervix and uterus (hysterectomy). After endometrial ablation, you will have little or no menstrual bleeding and may not be able to have children. However, if you are premenopausal, you will need to use a reliable method of birth control following the procedure because of the small chance that pregnancy can occur. There are different reasons to have this procedure, which include:  Heavy periods.  Bleeding that is causing anemia.  Irregular bleeding.  Bleeding fibroids on the lining inside the uterus if they are smaller than 3 centimeters. This procedure should not be done if:  You want children in the future.  You have severe cramps with your menstrual period.  You have precancerous or cancerous cells in your uterus.  You were recently pregnant.  You have gone through menopause.  You have had major surgery on the uterus, such as a cesarean delivery. LET [facility] CARE PROVIDER KNOW ABOUT:  Any allergies you have.  All medicines you are taking, including vitamins, herbs, eye drops, creams, and over-the-counter medicines.  Previous problems you or members of your family have had with the use of anesthetics.  Any blood disorders you have.  Previous surgeries you have had.  Medical conditions you have. RISKS AND COMPLICATIONS  Generally, this is a safe procedure. However, as with any procedure, complications can occur. Possible complications include:  Perforation of the uterus.  Bleeding.  Infection of the uterus, bladder, or vagina.  Injury to surrounding organs.  An air bubble to the lung (air embolus).  Pregnancy following the procedure.  Failure of the procedure to help the problem, requiring hysterectomy.  Decreased ability to diagnose cancer in the lining of  the uterus. BEFORE THE PROCEDURE  The lining of the uterus must be tested to make sure there is no pre-cancerous or cancer cells present.  An ultrasound may be performed to look at the size of the uterus and to check for abnormalities.  Medicines may be given to thin the lining of the uterus. PROCEDURE  During the procedure, your health care provider will use a tool called a resectoscope to help see inside your uterus. There are different ways to remove the lining of your uterus.   Radiofrequency  This method uses a radiofrequency-alternating electric current to remove the lining of the uterus.  Cryotherapy This method uses extreme cold to freeze the lining of the uterus.  Heated-Free Liquid  This method uses heated salt (saline) solution to remove the lining of the uterus.  Microwave This method uses high-energy microwaves to heat up the lining of the uterus to remove it.  Thermal balloon  This method involves inserting a catheter with a balloon tip into the uterus. The balloon tip is filled with heated fluid to remove the lining of the uterus. AFTER THE PROCEDURE  After your procedure, do not have sexual intercourse or insert anything into your vagina until permitted by your health care provider. After the procedure, you may experience:  Cramps.  Vaginal discharge.  Frequent urination. Document Released: 04/07/2004 Document Revised: 01/29/2013 Document Reviewed: 10/30/2012 [facility] Patient Information 2014 [place], [place]. Endometrial Biopsy Endometrial biopsy is a procedure in which a tissue sample is taken from inside the uterus. The tissue sample is then looked at under a microscope to see if the tissue is normal or abnormal. The endometrium is the lining of the  uterus. This procedure helps determine where you are in your menstrual cycle and how hormone levels are affecting the lining of the uterus. This procedure may also be used to evaluate uterine bleeding or to diagnose  endometrial cancer, tuberculosis, polyps, or inflammatory conditions.  LET [facility] CARE PROVIDER KNOW ABOUT:  Any allergies you have.  All medicines you are taking, including vitamins, herbs, eye drops, creams, and over-the-counter medicines.  Previous problems you or members of your family have had with the use of anesthetics.  Any blood disorders you have.  Previous surgeries you have had.  Medical conditions you have.  Possibility of pregnancy. RISKS AND COMPLICATIONS Generally, this is a safe procedure. However, as with any procedure, complications can occur. Possible complications include:  Bleeding.  Pelvic infection.  Puncture of the uterine wall with the biopsy device (rare). BEFORE THE PROCEDURE   Keep a record of your menstrual cycles as directed by your health care provider. You may need to schedule your procedure for a specific time in your cycle.  You may want to bring a sanitary pad to wear home after the procedure.  Arrange for someone to drive you home after the procedure if you will be given a medicine to help you relax (sedative). PROCEDURE   You may be given a sedative to relax you.  You will lie on an exam table with your feet and legs supported as in a pelvic exam.  Your health care provider will insert an instrument (speculum) into your vagina to see your cervix.  Your cervix will be cleansed with an antiseptic solution. A medicine (local anesthetic) will be used to numb the cervix.  A forceps instrument (tenaculum) will be used to hold your cervix steady for the biopsy.  A thin, rodlike instrument (uterine sound) will be inserted through your cervix to determine the length of your uterus and the location where the biopsy sample will be removed.  A thin, flexible tube (catheter) will be inserted through your cervix and into the uterus. The catheter is used to collect the biopsy sample from your endometrial tissue.  The catheter and speculum  will then be removed, and the tissue sample will be sent to a lab for examination. AFTER THE PROCEDURE  You will rest in a recovery area until you are ready to go home.  You may have mild cramping and a small amount of vaginal bleeding for a few days after the procedure. This is normal.  Make sure you find out how to get your test results. Document Released: 09/29/2004 Document Revised: 01/29/2013 Document Reviewed: 11/13/2012 [facility] Patient Information 2014 [place], [place]. Follow up in 1 week with Dr Emelda Fear for endo biopsy

## 2013-04-28 NOTE — Assessment & Plan Note (Signed)
Schedule endo biopsy with [facility] and discuss ablation

## 2013-04-28 NOTE — Progress Notes (Signed)
Subjective:     Patient ID: Judy Cross, female   DOB: 1965/11/10, 47 y.o.   MRN: [id number]  HPI Judy Cross is a 47 year old black female in for [place] for Irregular bleeding and uterine enlargement. FSH 34.5 in October.  Review of Systems See HPI Reviewed past medical,surgical, social and family history. Reviewed medications and allergies.     Objective:   Physical Exam BP 120/80  Ht 5\' 1"  (1.549 m)  Wt 164 lb (74.39 kg)  BMI 31.00 kg/m2  LMP 02/28/2013   [place] reviewed with pt. The US showed a 8.4 x 5.2 cm uterus with multiple fibroids and bilateral ovarian cysts,simple, and an endometrium of 13 mm with a 2.5 x 1.6 cm endometrial polyp with +doppler flow.Discussed endometrial biopsy and ablation.  Assessment:     Endometrial polyp Fibroids Irregular bleeding     Plan:    Return in 1 week to see Dr Emelda Fear for endo biopsy and discuss D&C with endo ablation Review handouts on endo biopsy and endo ablation

## 2013-05-07 ENCOUNTER — Other Ambulatory Visit: Payer: Self-pay | Admitting: Obstetrics and Gynecology

## 2013-05-07 ENCOUNTER — Ambulatory Visit (INDEPENDENT_AMBULATORY_CARE_PROVIDER_SITE_OTHER): Payer: Medicaid Other | Admitting: Obstetrics and Gynecology

## 2013-05-07 ENCOUNTER — Encounter: Payer: Self-pay | Admitting: Obstetrics and Gynecology

## 2013-05-07 VITALS — BP 130/86 | Ht 61.0 in | Wt 163.0 lb

## 2013-05-07 DIAGNOSIS — N938 Other specified abnormal uterine and vaginal bleeding: Secondary | ICD-10-CM

## 2013-05-07 DIAGNOSIS — Z3200 Encounter for pregnancy test, result unknown: Secondary | ICD-10-CM

## 2013-05-07 DIAGNOSIS — Z3202 Encounter for pregnancy test, result negative: Secondary | ICD-10-CM

## 2013-05-07 DIAGNOSIS — N949 Unspecified condition associated with female genital organs and menstrual cycle: Secondary | ICD-10-CM

## 2013-05-07 LAB — POCT URINE PREGNANCY: Preg Test, Ur: NEGATIVE

## 2013-05-07 NOTE — Patient Instructions (Signed)
Expect a call from amy barnes re procedure.

## 2013-05-07 NOTE — Progress Notes (Signed)
Scheduled surgery on 05-23-13

## 2013-05-07 NOTE — Progress Notes (Signed)
Here for endomet biopsy before scheduling hysteroscopy D&C , removal of endometrial polyp, endometrial ablation  Endometrial Biopsy: Patient given informed consent, signed copy in the chart, time out was performed. Time out taken. . The patient was placed in the lithotomy position and the cervix brought into view with sterile speculum.  Portio of cervix cleansed x 2 with betadine swabs.  A tenaculum was placed in the anterior lip of the cervix. The uterus was sounded for depth of 8 cm,. Milex uterine Explora 3 mm was introduced to into the uterus, suction created,  and an endometrial sample was obtained. All equipment was removed and accounted for.  Clots and scanty tissue obtained. Polyp suspected. The patient tolerated the procedure well.    Patient given post procedure instructions.  Followup: Samule Dry to help with scheduling surgery in DEC

## 2013-05-11 ENCOUNTER — Encounter (HOSPITAL_COMMUNITY): Payer: Self-pay | Admitting: Emergency Medicine

## 2013-05-11 ENCOUNTER — Emergency Department (HOSPITAL_COMMUNITY)
Admission: EM | Admit: 2013-05-11 | Discharge: 2013-05-11 | Disposition: A | Discharge status: Home / Self Care | Payer: Medicaid Other | Attending: Emergency Medicine | Admitting: Emergency Medicine

## 2013-05-11 ENCOUNTER — Other Ambulatory Visit: Payer: Self-pay

## 2013-05-11 DIAGNOSIS — Z880 Allergy status to penicillin: Secondary | ICD-10-CM | POA: Insufficient documentation

## 2013-05-11 DIAGNOSIS — R002 Palpitations: Secondary | ICD-10-CM | POA: Insufficient documentation

## 2013-05-11 DIAGNOSIS — Z8742 Personal history of other diseases of the female genital tract: Secondary | ICD-10-CM | POA: Insufficient documentation

## 2013-05-11 DIAGNOSIS — E876 Hypokalemia: Secondary | ICD-10-CM | POA: Insufficient documentation

## 2013-05-11 DIAGNOSIS — G40909 Epilepsy, unspecified, not intractable, without status epilepticus: Secondary | ICD-10-CM | POA: Insufficient documentation

## 2013-05-11 DIAGNOSIS — M129 Arthropathy, unspecified: Secondary | ICD-10-CM | POA: Insufficient documentation

## 2013-05-11 DIAGNOSIS — F17200 Nicotine dependence, unspecified, uncomplicated: Secondary | ICD-10-CM | POA: Insufficient documentation

## 2013-05-11 DIAGNOSIS — Z79899 Other long term (current) drug therapy: Secondary | ICD-10-CM | POA: Insufficient documentation

## 2013-05-11 DIAGNOSIS — I10 Essential (primary) hypertension: Secondary | ICD-10-CM | POA: Insufficient documentation

## 2013-05-11 LAB — COMPREHENSIVE METABOLIC PANEL
ALT: 16 U/L (ref 0–35)
AST: 20 U/L (ref 0–37)
Albumin: 3.3 g/dL — ABNORMAL LOW (ref 3.5–5.2)
Alkaline Phosphatase: 44 U/L (ref 39–117)
BUN: 14 mg/dL (ref 6–23)
CO2: 24 mEq/L (ref 19–32)
Calcium: 9.1 mg/dL (ref 8.4–10.5)
Chloride: 104 mEq/L (ref 96–112)
Creatinine, Ser: 0.87 mg/dL (ref 0.50–1.10)
GFR calc Af Amer: 90 mL/min — ABNORMAL LOW (ref >90)
GFR calc non Af Amer: 78 mL/min — ABNORMAL LOW (ref >90)
Glucose, Bld: 110 mg/dL — ABNORMAL HIGH (ref 70–99)
Potassium: 2.9 mEq/L — ABNORMAL LOW (ref 3.5–5.1)
Sodium: 137 mEq/L (ref 135–145)
Total Bilirubin: 0.8 mg/dL (ref 0.3–1.2)
Total Protein: 6.6 g/dL (ref 6.0–8.3)

## 2013-05-11 LAB — CBC WITH DIFFERENTIAL/PLATELET
Basophils Absolute: 0 10*3/uL (ref 0.0–0.1)
Basophils Relative: 0 % (ref 0–1)
Eosinophils Absolute: 0.2 10*3/uL (ref 0.0–0.7)
Eosinophils Relative: 4 % (ref 0–5)
HCT: 37 % (ref 36.0–46.0)
Hemoglobin: 12.8 g/dL (ref 12.0–15.0)
Lymphocytes Relative: 46 % (ref 12–46)
Lymphs Abs: 2.6 10*3/uL (ref 0.7–4.0)
MCH: 32.5 pg (ref 26.0–34.0)
MCHC: 34.6 g/dL (ref 30.0–36.0)
MCV: 93.9 fL (ref 78.0–100.0)
Monocytes Absolute: 0.3 10*3/uL (ref 0.1–1.0)
Monocytes Relative: 6 % (ref 3–12)
Neutro Abs: 2.5 10*3/uL (ref 1.7–7.7)
Neutrophils Relative %: 44 % (ref 43–77)
Platelets: 254 10*3/uL (ref 150–400)
RBC: 3.94 MIL/uL (ref 3.87–5.11)
RDW: 13.5 % (ref 11.5–15.5)
WBC: 5.7 10*3/uL (ref 4.0–10.5)

## 2013-05-11 LAB — TROPONIN I: Troponin I: <0.3 ng/mL (ref <0.30)

## 2013-05-11 MED ORDER — POTASSIUM CHLORIDE ER 10 MEQ PO TBCR: 10.0000 meq | EXTENDED_RELEASE_TABLET | Freq: Two times a day (BID) | ORAL | Status: DC

## 2013-05-11 NOTE — ED Notes (Signed)
Pt now describes that pain as a fluttering type pain and states that she will feel her heartbeat harder during the episodes and become sob,

## 2013-05-11 NOTE — ED Provider Notes (Signed)
CSN: [id number]     Arrival date & time 05/11/13  4098 History   This chart was scribed for Judy Lyons, MD, by Judy Cross, ED Scribe. This patient was seen in room APA06/APA06 and the patient's care was started at 8:52 AM.  First MD Initiated Contact with Patient 05/11/13 [phone]     Chief Complaint  Patient presents with  . Chest Pain    The history is provided by the patient. No language interpreter was used.    HPI Comments: Judy Cross is a 47 y.o. female, with a h/o HTN and seizures, who presents to the Emergency Department complaining of gradually-increasing chest pains which have been occurring intermittently for three days. She also has noticed an irregular heartbeat associated with the chest pain. The pt has a h/o chest wall pain, but she denies a h/o similar symptoms.  The pt raked leaves four days ago, and she first noticed the chest pains that evening. She denies any cardiac stents; she had a stress test performed when pregnant with her daughter 10 years ago.She denies drinking coffee or excessive amounts of caffeine.  The pt had a endometrial polyp removed by Dr. Emelda Fear two weeks ago. She  takes lisinopril, hydrocodone, claritin, gabapentin, keppra, and hydrochlorothiazide. She is a daily smoker.   Past Medical History  Diagnosis Date  . 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 Hypertension   . Seizures     last seizure was a year ago  . Arthritis   . Irregular bleeding 03/26/2013  . Hot flashes 03/26/2013  . Endometrial polyp 04/28/2013  . Fibroids 04/28/2013   Past Surgical History  Procedure Laterality Date  . Cesarean section      x 3  . Examination under anesthesia  02/28/2012    Procedure: EXAM UNDER ANESTHESIA;  Surgeon: Fabio Bering, MD;  Location: [facility];  Service: General;  Laterality: N/A;  . Sphincterotomy  02/28/2012    Procedure: SPHINCTEROTOMY;  Surgeon: Fabio Bering, MD;  Location: [facility];  Service: General;  Laterality: N/A;  Lateral  Internal Sphincterotomy  . Trigger finger release    . Ganglion cyst excision    . Tubal ligation     Family History  Problem Relation Age of Onset  . Hypertension Mother   . Hypertension Father    History  Substance Use Topics  . Smoking status: Current Every Day Smoker -- 4.00 packs/day for 20 years    Types: Cigarettes  . Smokeless tobacco: Never Used  . Alcohol Use: Yes     Comment: occasional   OB History   Grav Para Term Preterm Abortions TAB SAB Ect Mult Living   4 3   1  1   3      Review of Systems  All other systems reviewed and are negative.   Allergies  Penicillins  Home Medications   Current Outpatient Rx  Name  Route  Sig  Dispense  Refill  . albuterol (PROVENTIL HFA;VENTOLIN HFA) 108 (90 BASE) MCG/ACT inhaler   Inhalation   Inhale 2 puffs into the lungs at bedtime.         . cyclobenzaprine (FLEXERIL) 10 MG tablet   Oral   Take 1 tablet (10 mg total) by mouth 3 (three) times daily as needed.   21 tablet   0   . gabapentin (NEURONTIN) 300 MG capsule   Oral   Take 300 mg by mouth daily as needed.          Marland Kitchen  hydrochlorothiazide 25 MG tablet   Oral   Take 25 mg by mouth daily.          Marland Kitchen HYDROcodone-acetaminophen (NORCO/VICODIN) 5-325 MG per tablet   Oral   Take 1 tablet by mouth every 4 (four) hours as needed for pain.   15 tablet   0   . ibuprofen (ADVIL,MOTRIN) 800 MG tablet   Oral   Take 800 mg by mouth every 8 (eight) hours as needed for pain.         Marland Kitchen levETIRAcetam (KEPPRA) 500 MG tablet   Oral   Take 500 mg by mouth 2 (two) times daily.          Marland Kitchen lisinopril (PRINIVIL,ZESTRIL) 20 MG tablet   Oral   Take 10 mg by mouth daily.          Marland Kitchen loratadine (CLARITIN) 10 MG tablet   Oral   Take 10 mg by mouth daily.         . potassium chloride (K-DUR) 10 MEQ tablet   Oral   Take 2 tablets (20 mEq total) by mouth 2 (two) times daily.   10 tablet   0   . traMADol (ULTRAM) 50 MG tablet   Oral   Take 1 tablet (50 mg  total) by mouth every 6 (six) hours as needed for pain.   15 tablet   0    Triage Vitals: BP 127/87  Pulse 78  Temp(Src) 99.1 F (37.3 C) (Oral)  Resp 20  Ht 5\' 1"  (1.549 m)  Wt 168 lb (76.204 kg)  BMI 31.76 kg/m2  SpO2 96%  Physical Exam  Nursing note and vitals reviewed. Constitutional: She is oriented to person, place, and time. She appears well-developed and well-nourished. No distress.  HENT:  Head: Normocephalic and atraumatic.  Eyes: EOM are normal.  Neck: Neck supple. No tracheal deviation present.  Cardiovascular: Normal rate, regular rhythm, normal heart sounds and intact distal pulses.   No murmur heard. Pulmonary/Chest: Effort normal and breath sounds normal. No respiratory distress. She has no wheezes. She has no rales.  Abdominal: Soft. There is no tenderness.  Musculoskeletal: Normal range of motion. She exhibits no edema.  Neurological: She is alert and oriented to person, place, and time.  Skin: Skin is warm and dry.  Psychiatric: She has a normal mood and affect. Her behavior is normal.    ED Course  Procedures (including critical care time)  DIAGNOSTIC STUDIES: Oxygen Saturation is 96% on room air, normal by my interpretation.    COORDINATION OF CARE:  8:58 AM- Discussed treatment plan with patient, and the patient agreed to the plan.   Labs Review Labs Reviewed  CBC WITH DIFFERENTIAL  COMPREHENSIVE METABOLIC PANEL  TROPONIN I   Imaging Review No results found.   Date: 05/11/2013  Rate: 66  Rhythm: normal sinus rhythm  QRS Axis: normal  Intervals: normal  ST/T Wave abnormalities: normal  Conduction Disutrbances:none  Narrative Interpretation:   Old EKG Reviewed: unchanged    MDM  No diagnosis found. Patient is a 47 year old female presents with complaints of her heart skipping. She states that he occasionally she feels a skipped beat followed by a forceful beat. She denies any chest pain, shortness of breath, or exertional symptoms.  Workup today reveals a normal CBC and electrolyte panel that is essentially unremarkable with the exception of a low potassium. This may be exacerbating these palpitations and will be replaced with oral potassium. Her troponin is negative and EKG is  not suggestive of any coronary ischemia. I have a low suspicion of an acute cardiac event and do not feel as though further workup is indicated into this at this time. This appears to be more of a symptomatic PVC.  I personally performed the services described in this documentation, which was scribed in my presence. The recorded information has been reviewed and is accurate.      Judy Lyons, MD 05/11/13 1013

## 2013-05-11 NOTE — ED Notes (Addendum)
Pt c/io left side chest pain described as a "jabbing" sensation located under left breast area, denies any radiation, sob, n/v, pain started before thanksgiving and has steadily become worse.

## 2013-05-13 ENCOUNTER — Telehealth: Payer: Self-pay | Admitting: Obstetrics and Gynecology

## 2013-05-15 NOTE — Telephone Encounter (Signed)
Caller unavailable @ 11:34 am. [employer]

## 2013-05-16 NOTE — Telephone Encounter (Signed)
Caller unavailable @ 8:54 am. Encounter closed. JSY

## 2013-05-16 NOTE — Treatment Plan (Signed)
Endometrial biopsy benign endometrium U/s  : large endometrial polyp(or pedunculated fibroid) P: can proceed toward endo ablation scheduled for 05/23/13    Needs Pap/Gc/CHL

## 2013-05-19 ENCOUNTER — Encounter (HOSPITAL_COMMUNITY): Payer: Self-pay

## 2013-05-19 ENCOUNTER — Encounter (HOSPITAL_COMMUNITY)
Admission: RE | Admit: 2013-05-19 | Discharge: 2013-05-19 | Disposition: A | Discharge status: Home / Self Care | Payer: Medicaid Other | Source: Ambulatory Visit | Attending: Obstetrics and Gynecology | Admitting: Obstetrics and Gynecology

## 2013-05-19 DIAGNOSIS — Z01812 Encounter for preprocedural laboratory examination: Secondary | ICD-10-CM | POA: Insufficient documentation

## 2013-05-19 DIAGNOSIS — Z01818 Encounter for other preprocedural examination: Secondary | ICD-10-CM | POA: Insufficient documentation

## 2013-05-19 HISTORY — DX: Other seasonal allergic rhinitis: J30.2

## 2013-05-19 LAB — HCG, SERUM, QUALITATIVE: Preg, Serum: NEGATIVE

## 2013-05-19 LAB — BASIC METABOLIC PANEL
BUN: 12 mg/dL (ref 6–23)
CO2: 21 mEq/L (ref 19–32)
Calcium: 9.5 mg/dL (ref 8.4–10.5)
Chloride: 106 mEq/L (ref 96–112)
Creatinine, Ser: 0.92 mg/dL (ref 0.50–1.10)
GFR calc Af Amer: 84 mL/min — ABNORMAL LOW (ref >90)
GFR calc non Af Amer: 72 mL/min — ABNORMAL LOW (ref >90)
Glucose, Bld: 139 mg/dL — ABNORMAL HIGH (ref 70–99)
Potassium: 3.9 mEq/L (ref 3.5–5.1)
Sodium: 141 mEq/L (ref 135–145)

## 2013-05-19 LAB — HEMOGLOBIN AND HEMATOCRIT, BLOOD
HCT: 37.8 % (ref 36.0–46.0)
Hemoglobin: 13 g/dL (ref 12.0–15.0)

## 2013-05-19 NOTE — Patient Instructions (Signed)
Judy Cross  05/19/2013   Your procedure is scheduled on:  FRIDAY, 05/23/13  Report to [facility] at [place] [facility].  Call this number if you have problems the morning of surgery: [phone]   Remember:   Do not eat food or drink liquids after midnight.   Take these medicines the morning of surgery with A SIP OF WATER: KEPPRA, LISINOPRIL, CLARITIN, ALBUTEROL. PLEASE BRING YOUR INHALER.   Do not wear jewelry, make-up or nail polish.  Do not wear lotions, powders, or perfumes. You may wear deodorant.  Do not shave 48 hours prior to surgery. Men may shave face and neck.  Do not bring valuables to the hospital.  [facility] is not responsible                  for any belongings or valuables.               Contacts, dentures or bridgework may not be worn into surgery.  Leave suitcase in the car. After surgery it may be brought to your room.  For patients admitted to the hospital, discharge time is determined by your                treatment team.               Patients discharged the day of surgery will not be allowed to drive  home.  Name and phone number of your driver: FRIEND, JIMMY  Special Instructions: Shower using CHG 2 nights before surgery and the night before surgery.  If you shower the day of surgery use CHG.  Use special wash - you have one bottle of CHG for all showers.  You should use approximately 1/3 of the bottle for each shower.   Please read over the following fact sheets that you were given: Anesthesia Post-op Instructions and Care and Recovery After Surgery   Endometrial Ablation Endometrial ablation removes the lining of the uterus (endometrium). It is usually a same-day, outpatient treatment. Ablation helps avoid major surgery, such as surgery to remove the cervix and uterus (hysterectomy). After endometrial ablation, you will have little or no menstrual bleeding and may not be able to have children. However, if you are premenopausal, you will need to use a reliable  method of birth control following the procedure because of the small chance that pregnancy can occur. There are different reasons to have this procedure, which include:  Heavy periods.  Bleeding that is causing anemia.  Irregular bleeding.  Bleeding fibroids on the lining inside the uterus if they are smaller than 3 centimeters. This procedure should not be done if:  You want children in the future.  You have severe cramps with your menstrual period.  You have precancerous or cancerous cells in your uterus.  You were recently pregnant.  You have gone through menopause.  You have had major surgery on the uterus, such as a cesarean delivery. LET [facility] CARE PROVIDER KNOW ABOUT:  Any allergies you have.  All medicines you are taking, including vitamins, herbs, eye drops, creams, and over-the-counter medicines.  Previous problems you or members of your family have had with the use of anesthetics.  Any blood disorders you have.  Previous surgeries you have had.  Medical conditions you have. RISKS AND COMPLICATIONS  Generally, this is a safe procedure. However, as with any procedure, complications can occur. Possible complications include:  Perforation of the uterus.  Bleeding.  Infection of the uterus, bladder, or vagina.  Injury to surrounding organs.  An air bubble to the lung (air embolus).  Pregnancy following the procedure.  Failure of the procedure to help the problem, requiring hysterectomy.  Decreased ability to diagnose cancer in the lining of the uterus. BEFORE THE PROCEDURE  The lining of the uterus must be tested to make sure there is no pre-cancerous or cancer cells present.  An ultrasound may be performed to look at the size of the uterus and to check for abnormalities.  Medicines may be given to thin the lining of the uterus. PROCEDURE  During the procedure, your health care provider will use a tool called a resectoscope to help see inside  your uterus. There are different ways to remove the lining of your uterus.   Radiofrequency  This method uses a radiofrequency-alternating electric current to remove the lining of the uterus.  Cryotherapy This method uses extreme cold to freeze the lining of the uterus.  Heated-Free Liquid  This method uses heated salt (saline) solution to remove the lining of the uterus.  Microwave This method uses high-energy microwaves to heat up the lining of the uterus to remove it.  Thermal balloon  This method involves inserting a catheter with a balloon tip into the uterus. The balloon tip is filled with heated fluid to remove the lining of the uterus. AFTER THE PROCEDURE  After your procedure, do not have sexual intercourse or insert anything into your vagina until permitted by your health care provider. After the procedure, you may experience:  Cramps.  Vaginal discharge.  Frequent urination. Document Released: 04/07/2004 Document Revised: 01/29/2013 Document Reviewed: 10/30/2012 [facility] Patient Information 2014 [place], [place].  Dilation and Curettage or Vacuum Curettage, Care After These instructions give you information on caring for yourself after your procedure. Your doctor may also give you more specific instructions. Call your doctor if you have any problems or questions after your procedure. HOME CARE  Do not drive for 24 hours.  Wait 1 week before doing any activities that wear you out.  Take your temperature 2 times a day for 4 days. Write it down. Tell your doctor if you have a fever.  Do not stand for a long time.  Do not lift, push, or pull anything over 10 pounds (4.5 kilograms).  Limit stair climbing to once or twice a day.  Rest often.  Continue with your usual diet.  Drink enough fluids to keep your pee (urine) clear or pale yellow.  If you have a hard time pooping (constipation), you may:  Take a medicine to help you go poop (laxative) as told by your  doctor.  Eat more fruit and bran.  Drink more fluids.  Take showers, not baths, for as long as told by your doctor.  Do not swim or use a hot tub until your doctor says it is okay.  Have someone with you for 1 2 days after the procedure.  Do not douche, use tampons, or have sex (intercourse) for 2 weeks.  Only take medicines as told by your doctor. Do not take aspirin. It can cause bleeding.  Keep all doctor visits. GET HELP IF:  You have cramps or pain not helped by medicine.  You have new pain in the belly (abdomen).  You have a bad smelling fluid coming from your vagina.  You have a rash.  You have problems with any medicine. GET HELP RIGHT AWAY IF:   You start to bleed more than a regular period.  You have a  fever.  You have chest pain.  You have trouble breathing.  You feel dizzy or feel like passing out (fainting).  You pass out.  You have pain in the tops of your shoulders.  You have vaginal bleeding with or without clumps of blood (blood clots). Document Released: 03/07/2008 Document Revised: 01/29/2013 Document Reviewed: 12/26/2012 [facility] Patient Information 2014 [place], [place]. PATIENT INSTRUCTIONS POST-ANESTHESIA  IMMEDIATELY FOLLOWING SURGERY:  Do not drive or operate machinery for the first twenty four hours after surgery.  Do not make any important decisions for twenty four hours after surgery or while taking narcotic pain medications or sedatives.  If you develop intractable nausea and vomiting or a severe headache please notify your doctor immediately.  FOLLOW-UP:  Please make an appointment with your surgeon as instructed. You do not need to follow up with anesthesia unless specifically instructed to do so.  WOUND CARE INSTRUCTIONS (if applicable):  Keep a dry clean dressing on the anesthesia/puncture wound site if there is drainage.  Once the wound has quit draining you may leave it open to air.  Generally you should leave the bandage intact  for twenty four hours unless there is drainage.  If the epidural site drains for more than 36-48 hours please call the anesthesia department.  QUESTIONS?:  Please feel free to call your physician or the hospital operator if you have any questions, and they will be happy to assist you.

## 2013-05-22 ENCOUNTER — Other Ambulatory Visit (HOSPITAL_COMMUNITY): Payer: Medicaid Other

## 2013-05-22 NOTE — H&P (Signed)
Judy Cross is an 47 y.o. female. She is scheduled for hysteroscopy, Dilation and curettage, with removal of endometrial polyp, and endometrial ablation. With evaluation showing an endometrial polyp, and biopsy benign GYNECOLOGIC SONOGRAM  Judy Cross is a 47 y.o. [id number] LMP 02/28/2013 for a pelvic sonogram for irregular cycles and ?enlarged uterus.  Uterus 8.4 x 5.2 cm, retroverted with multiple fibroids noted largest fibroid=96mm  Endometrium 13 mm, asymmetrical, with 2.5 x 1.6cm (?polyp) hyperechoic mass with +Doppler flow noted within cavity  Right ovary 5.0 x 3.7 x 2.6 cm, with 2 cystic areas noted #1-3.5 x 2.5cm (simple), #2-2.6x2.1cm(simple)  Left ovary 3.4 x 2.8 x 2.2 cm, with 2.4 x 1.9 cm simple cystic area noted  No free fluid noted within pelvis  Technician Comments:  Endometrial Polyp noted, multiple fibroids noted, bilateral ovarian cysts, no free fluid noted  Judy Cross  04/28/2013  10:46 AM    Pertinent Gynecological History: Menses: irregular with US showing an endometrial polyp.  Bleeding: dysfunctional uterine bleeding Contraception: tubal ligation DES exposure: unknown Blood transfusions: none Sexually transmitted diseases: no past history Previous GYN Procedures: tubal  Last mammogram: normal Date: 10/14/12 Last pap: normal Date: 08/2010 OB History: G, P   Menstrual History: Menarche age:  No LMP recorded. Patient is not currently having periods (Reason: Other).    Past Medical History  Diagnosis Date  . 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 Hypertension   . Seizures     last seizure was a year ago  . Arthritis   . Irregular bleeding 03/26/2013  . Hot flashes 03/26/2013  . Endometrial polyp 04/28/2013  . Fibroids 04/28/2013  . Seasonal allergies     Past Surgical History  Procedure Laterality Date  . Cesarean section      x 3  . Examination under anesthesia  02/28/2012    Procedure: EXAM UNDER ANESTHESIA;  Surgeon: Fabio Bering,  MD;  Location: [facility];  Service: General;  Laterality: N/A;  . Sphincterotomy  02/28/2012    Procedure: SPHINCTEROTOMY;  Surgeon: Fabio Bering, MD;  Location: [facility];  Service: General;  Laterality: N/A;  Lateral Internal Sphincterotomy  . Trigger finger release    . Ganglion cyst excision    . Tubal ligation      Family History  Problem Relation Age of Onset  . Hypertension Mother   . Hypertension Father     Social History:  reports that she has been smoking Cigarettes.  She has a 5 pack-year smoking history. She has never used smokeless tobacco. She reports that she drinks alcohol. She reports that she does not use illicit drugs.  Allergies:  Allergies  Allergen Reactions  . Penicillins Rash    No prescriptions prior to admission    ROS  There were no vitals taken for this visit. Physical Exam  Constitutional: She appears well-developed and well-nourished.  HENT:  Head: Normocephalic.  Eyes: Pupils are equal, round, and reactive to light.  Neck: Normal range of motion. Neck supple.  Cardiovascular: Normal rate.   Respiratory: Effort normal.  GI: Soft.  Genitourinary: Vagina normal and uterus normal.  See u/s report    No results found for this or any previous visit (from the past 24 hour(s)).  No results found. .fsh 34 CBC    Component Value Date/Time   WBC 5.7 05/11/2013 0918   RBC 3.94 05/11/2013 0918   HGB 13.0 05/19/2013 1123   HCT 37.8 05/19/2013 1123   PLT 254  05/11/2013 0918   MCV 93.9 05/11/2013 0918   MCH 32.5 05/11/2013 0918   MCHC 34.6 05/11/2013 0918   RDW 13.5 05/11/2013 0918   LYMPHSABS 2.6 05/11/2013 0918   MONOABS 0.3 05/11/2013 0918   EOSABS 0.2 05/11/2013 0918   BASOSABS 0.0 05/11/2013 8657      Assessment/Plan: Endometrial polyp or fibroid DUB due to polyp Plan hysteroscopy, dilation and curettage, removal of polyp and endometrial ablation  Judy Cross V 05/22/2013, 10:38 PM

## 2013-05-23 ENCOUNTER — Ambulatory Visit (HOSPITAL_COMMUNITY): Payer: Medicaid Other | Admitting: Anesthesiology

## 2013-05-23 ENCOUNTER — Ambulatory Visit (HOSPITAL_COMMUNITY)
Admission: RE | Admit: 2013-05-23 | Discharge: 2013-05-23 | Disposition: A | Discharge status: Home / Self Care | Payer: Medicaid Other | Source: Ambulatory Visit | Attending: Obstetrics and Gynecology | Admitting: Obstetrics and Gynecology

## 2013-05-23 ENCOUNTER — Encounter (HOSPITAL_COMMUNITY)
Admission: RE | Disposition: A | Discharge status: Home / Self Care | Payer: Self-pay | Source: Ambulatory Visit | Attending: Obstetrics and Gynecology

## 2013-05-23 ENCOUNTER — Encounter (HOSPITAL_COMMUNITY): Payer: Self-pay | Admitting: *Deleted

## 2013-05-23 ENCOUNTER — Encounter (HOSPITAL_COMMUNITY): Payer: Medicaid Other | Admitting: Anesthesiology

## 2013-05-23 DIAGNOSIS — N926 Irregular menstruation, unspecified: Secondary | ICD-10-CM

## 2013-05-23 DIAGNOSIS — I10 Essential (primary) hypertension: Secondary | ICD-10-CM | POA: Insufficient documentation

## 2013-05-23 DIAGNOSIS — N920 Excessive and frequent menstruation with regular cycle: Secondary | ICD-10-CM

## 2013-05-23 DIAGNOSIS — N840 Polyp of corpus uteri: Secondary | ICD-10-CM

## 2013-05-23 HISTORY — PX: HYSTEROSCOPY WITH D & C: SHX1775

## 2013-05-23 HISTORY — PX: POLYPECTOMY: SHX5525

## 2013-05-23 SURGERY — POLYPECTOMY
Anesthesia: General | Site: Uterus

## 2013-05-23 MED ORDER — MIDAZOLAM HCL 2 MG/2ML IJ SOLN
INTRAMUSCULAR | Status: AC
  Filled 2013-05-23: qty 2

## 2013-05-23 MED ORDER — MIDAZOLAM HCL 2 MG/2ML IJ SOLN
1.0000 mg | INTRAMUSCULAR | Status: DC | PRN
  Administered 2013-05-23: 2 mg via INTRAVENOUS

## 2013-05-23 MED ORDER — GLYCOPYRROLATE 0.2 MG/ML IJ SOLN
INTRAMUSCULAR | Status: DC | PRN
  Administered 2013-05-23: 0.2 mg via INTRAVENOUS

## 2013-05-23 MED ORDER — FENTANYL CITRATE 0.05 MG/ML IJ SOLN
25.0000 ug | INTRAMUSCULAR | Status: AC
  Administered 2013-05-23 (×2): 25 ug via INTRAVENOUS
  Filled 2013-05-23: qty 2

## 2013-05-23 MED ORDER — LACTATED RINGERS IV SOLN
INTRAVENOUS | Status: DC
  Administered 2013-05-23 (×2): via INTRAVENOUS

## 2013-05-23 MED ORDER — LIDOCAINE HCL (PF) 1 % IJ SOLN
INTRAMUSCULAR | Status: AC
  Filled 2013-05-23: qty 5

## 2013-05-23 MED ORDER — ONDANSETRON HCL 4 MG/2ML IJ SOLN
INTRAMUSCULAR | Status: AC
  Filled 2013-05-23: qty 2

## 2013-05-23 MED ORDER — SUCCINYLCHOLINE CHLORIDE 20 MG/ML IJ SOLN
INTRAMUSCULAR | Status: DC | PRN
  Administered 2013-05-23: 160 mg via INTRAVENOUS

## 2013-05-23 MED ORDER — FENTANYL CITRATE 0.05 MG/ML IJ SOLN
25.0000 ug | INTRAMUSCULAR | Status: DC | PRN
  Administered 2013-05-23: 50 ug via INTRAVENOUS

## 2013-05-23 MED ORDER — IBUPROFEN 600 MG PO TABS: 600.0000 mg | ORAL_TABLET | Freq: Four times a day (QID) | ORAL | Status: DC | PRN

## 2013-05-23 MED ORDER — BUPIVACAINE-EPINEPHRINE PF 0.5-1:200000 % IJ SOLN
INTRAMUSCULAR | Status: AC
  Filled 2013-05-23: qty 10

## 2013-05-23 MED ORDER — ONDANSETRON HCL 4 MG/2ML IJ SOLN
4.0000 mg | Freq: Once | INTRAMUSCULAR | Status: AC
  Administered 2013-05-23: 4 mg via INTRAVENOUS

## 2013-05-23 MED ORDER — LIDOCAINE HCL 1 % IJ SOLN
INTRAMUSCULAR | Status: DC | PRN
  Administered 2013-05-23: 50 mg via INTRADERMAL

## 2013-05-23 MED ORDER — PROPOFOL 10 MG/ML IV BOLUS
INTRAVENOUS | Status: AC
  Filled 2013-05-23: qty 20

## 2013-05-23 MED ORDER — BUPIVACAINE-EPINEPHRINE 0.5% -1:200000 IJ SOLN
INTRAMUSCULAR | Status: DC | PRN
  Administered 2013-05-23: 18 mL

## 2013-05-23 MED ORDER — PROPOFOL 10 MG/ML IV BOLUS
INTRAVENOUS | Status: DC | PRN
  Administered 2013-05-23: 200 mg via INTRAVENOUS

## 2013-05-23 MED ORDER — FENTANYL CITRATE 0.05 MG/ML IJ SOLN
INTRAMUSCULAR | Status: AC
  Filled 2013-05-23: qty 2

## 2013-05-23 MED ORDER — FENTANYL CITRATE 0.05 MG/ML IJ SOLN
INTRAMUSCULAR | Status: DC | PRN
  Administered 2013-05-23 (×3): 50 ug via INTRAVENOUS

## 2013-05-23 MED ORDER — OXYCODONE-ACETAMINOPHEN 5-325 MG PO TABS: 1.0000 | ORAL_TABLET | ORAL | Status: DC | PRN

## 2013-05-23 MED ORDER — SODIUM CHLORIDE 0.9 % IR SOLN
Status: DC | PRN
  Administered 2013-05-23: 1000 mL

## 2013-05-23 MED ORDER — GLYCOPYRROLATE 0.2 MG/ML IJ SOLN
INTRAMUSCULAR | Status: AC
  Filled 2013-05-23: qty 1

## 2013-05-23 MED ORDER — SUCCINYLCHOLINE CHLORIDE 20 MG/ML IJ SOLN
INTRAMUSCULAR | Status: AC
  Filled 2013-05-23: qty 1

## 2013-05-23 MED ORDER — ONDANSETRON HCL 4 MG/2ML IJ SOLN: 4.0000 mg | Freq: Once | INTRAMUSCULAR | Status: DC | PRN

## 2013-05-23 MED ORDER — MIDAZOLAM HCL 5 MG/5ML IJ SOLN
INTRAMUSCULAR | Status: DC | PRN
  Administered 2013-05-23 (×2): 2 mg via INTRAVENOUS

## 2013-05-23 SURGICAL SUPPLY — 33 items
BAG HAMPER (MISCELLANEOUS) ×3 IMPLANT
CLOTH BEACON ORANGE TIMEOUT ST (SAFETY) ×3 IMPLANT
COVER LIGHT HANDLE STERIS (MISCELLANEOUS) ×6 IMPLANT
DECANTER SPIKE VIAL GLASS SM (MISCELLANEOUS) ×3 IMPLANT
DRAPE PROXIMA HALF (DRAPES) ×3 IMPLANT
DRAPE STERI URO 9X17 APER PCH (DRAPES) ×3 IMPLANT
ELECT REM PT RETURN 9FT ADLT (ELECTROSURGICAL) ×3
ELECTRODE REM PT RTRN 9FT ADLT (ELECTROSURGICAL) ×2 IMPLANT
FORMALIN 10 PREFIL 120ML (MISCELLANEOUS) ×3 IMPLANT
GAUZE PACKING 2X5 YD STERILE (GAUZE/BANDAGES/DRESSINGS) ×3 IMPLANT
GLOVE BIOGEL PI IND STRL 7.0 (GLOVE) ×6 IMPLANT
GLOVE BIOGEL PI IND STRL 9 (GLOVE) ×2 IMPLANT
GLOVE BIOGEL PI INDICATOR 7.0 (GLOVE) ×3
GLOVE BIOGEL PI INDICATOR 9 (GLOVE) ×1
GLOVE ECLIPSE 6.5 STRL STRAW (GLOVE) ×3 IMPLANT
GLOVE ECLIPSE 9.0 STRL (GLOVE) ×3 IMPLANT
GLOVE EXAM NITRILE LRG STRL (GLOVE) ×6 IMPLANT
GLOVE INDICATOR STER SZ 9 (GLOVE) ×3 IMPLANT
GOWN STRL REIN 3XL LVL4 (GOWN DISPOSABLE) ×3 IMPLANT
GOWN STRL REIN XL XLG (GOWN DISPOSABLE) ×9 IMPLANT
INST SET HYSTEROSCOPY (KITS) ×3 IMPLANT
IV NS 1000ML (IV SOLUTION) ×1
IV NS 1000ML BAXH (IV SOLUTION) ×2 IMPLANT
KIT ROOM TURNOVER AP CYSTO (KITS) ×3 IMPLANT
KIT ROOM TURNOVER APOR (KITS) ×3 IMPLANT
MANIFOLD NEPTUNE II (INSTRUMENTS) ×3 IMPLANT
NS IRRIG 1000ML POUR BTL (IV SOLUTION) ×3 IMPLANT
PACK PERI GYN (CUSTOM PROCEDURE TRAY) ×3 IMPLANT
PAD ARMBOARD 7.5X6 YLW CONV (MISCELLANEOUS) ×3 IMPLANT
PAD TELFA 3X4 1S STER (GAUZE/BANDAGES/DRESSINGS) ×3 IMPLANT
SET BASIN LINEN APH (SET/KITS/TRAYS/PACK) ×3 IMPLANT
SET IRRIG Y TYPE TUR BLADDER L (SET/KITS/TRAYS/PACK) ×3 IMPLANT
SYR CONTROL 10ML LL (SYRINGE) ×3 IMPLANT

## 2013-05-23 NOTE — Interval H&P Note (Signed)
History and Physical Interval Note:  05/23/2013 7:32 AM  Natalye ANISSIA WESSELLS  has presented today for surgery, with the diagnosis of MENORRHAGIA ENDOMETRIAL POLYP  The various methods of treatment have been discussed with the patient and family. After consideration of risks, benefits and other options for treatment, the patient has consented to  Procedure(s): DILATATION & CURETTAGE/HYSTEROSCOPY WITH THERMACHOICE ABLATION (N/A) ENDOMETRIAL POLYP REMOVAL (N/A) as a surgical intervention .  The patient's history has been reviewed, patient examined, no change in status, stable for surgery.  I have reviewed the patient's chart and labs.  Questions were answered to the patient's satisfaction.  Since the pt has postmenopausal FSH values, we may not need to do endometrial ablation; this has been reveiwed with the patient who understands and agrees to this plan. Consent reflects this plan not to do ablation.   Tilda Burrow

## 2013-05-23 NOTE — Transfer of Care (Signed)
Immediate Anesthesia Transfer of Care Note  Patient: Judy Cross  Procedure(s) Performed: Procedure(s): ENDOMETRIAL POLYP REMOVAL (N/A) DILATATION AND CURETTAGE /HYSTEROSCOPY (N/A)  Patient Location: PACU  Anesthesia Type:General  Level of Consciousness: sedated  Airway & Oxygen Therapy: Patient Spontanous Breathing and Patient connected to face mask oxygen  Post-op Assessment: Report given to PACU RN and Post -op Vital signs reviewed and stable  Post vital signs: Reviewed and stable  Complications: No apparent anesthesia complications

## 2013-05-23 NOTE — Brief Op Note (Signed)
05/23/2013  9:05 AM  PATIENT:  Judy Cross  47 y.o. female  PRE-OPERATIVE DIAGNOSIS:  MENORRHAGIA ENDOMETRIAL POLYP  POST-OPERATIVE DIAGNOSIS:  MENORRHAGIA ENDOMETRIAL POLYP  PROCEDURE:  Procedure(s): ENDOMETRIAL POLYP REMOVAL (N/A) DILATATION AND CURETTAGE /HYSTEROSCOPY (N/A)  SURGEON:  Surgeon(s) and Role:    * Tilda Burrow, MD - Primary  PHYSICIAN ASSISTANT:   ASSISTANTS: none   ANESTHESIA:   paracervical block  EBL:  Total I/O In: 1000 [I.V.:1000] Out: -   BLOOD ADMINISTERED:none  DRAINS: none   LOCAL MEDICATIONS USED:  NONE  SPECIMEN:  Source of Specimen:  endometrial polyp  DISPOSITION OF SPECIMEN:  PATHOLOGY  COUNTS:  YES  TOURNIQUET:  * No tourniquets in log *  DICTATION: .Dragon Dictation  PLAN OF CARE: Discharge to home after PACU  PATIENT DISPOSITION:  PACU - hemodynamically stable.   Delay start of Pharmacological VTE agent (>24hrs) due to surgical blood loss or risk of bleeding: not applicable

## 2013-05-23 NOTE — Anesthesia Postprocedure Evaluation (Signed)
  Anesthesia Post-op Note  Patient: Judy Cross  Procedure(s) Performed: Procedure(s): ENDOMETRIAL POLYP REMOVAL (N/A) DILATATION AND CURETTAGE /HYSTEROSCOPY (N/A)  Patient Location: PACU  Anesthesia Type:General  Level of Consciousness: awake, alert , oriented and patient cooperative  Airway and Oxygen Therapy: Patient Spontanous Breathing  Post-op Pain: 2 /10, mild  Post-op Assessment: Post-op Vital signs reviewed, Patient's Cardiovascular Status Stable, Respiratory Function Stable, Patent Airway and Pain level controlled  Post-op Vital Signs: Reviewed and stable  Complications: No apparent anesthesia complications

## 2013-05-23 NOTE — Anesthesia Procedure Notes (Signed)
Procedure Name: Intubation and LMA Insertion Date/Time: 05/23/2013 8:05 AM Performed by: Despina Hidden Pre-anesthesia Checklist: Emergency Drugs available, Patient identified, Suction available and Patient being monitored Patient Re-evaluated:Patient Re-evaluated prior to inductionOxygen Delivery Method: Circle system utilized Preoxygenation: Pre-oxygenation with 100% oxygen Intubation Type: IV induction, Rapid sequence and Cricoid Pressure applied Ventilation: Mask ventilation without difficulty Laryngoscope Size: Mac and 3 Grade View: Grade II Tube type: Oral Tube size: 7.0 mm Number of attempts: 2 Airway Equipment and Method: Stylet and Patient positioned with wedge pillow Placement Confirmation: ETT inserted through vocal cords under direct vision,  positive ETCO2 and breath sounds checked- equal and bilateral Secured at: 22 cm Tube secured with: Tape Dental Injury: Teeth and Oropharynx as per pre-operative assessment  Difficulty Due To: Difficulty was unanticipated

## 2013-05-23 NOTE — Anesthesia Preprocedure Evaluation (Signed)
Anesthesia Evaluation  Patient identified by MRN, date of birth, ID band Patient awake    Reviewed: Allergy & Precautions, H&P , NPO status , Patient's Chart, lab work & pertinent test results  History of Anesthesia Complications Negative for: history of anesthetic complications  Airway Mallampati: II TM Distance: >3 FB     Dental  (+) Partial Upper   Pulmonary neg pulmonary ROS, Current Smoker,  breath sounds clear to auscultation        Cardiovascular hypertension, Pt. on medications Rhythm:Regular Rate:Normal     Neuro/Psych Seizures -, Well Controlled,     GI/Hepatic   Endo/Other    Renal/GU      Musculoskeletal   Abdominal   Peds  Hematology   Anesthesia Other Findings   Reproductive/Obstetrics                           Anesthesia Physical Anesthesia Plan  ASA: II  Anesthesia Plan: General   Post-op Pain Management:    Induction: Intravenous  Airway Management Planned: LMA  Additional Equipment:   Intra-op Plan:   Post-operative Plan: Extubation in OR  Informed Consent: I have reviewed the patients History and Physical, chart, labs and discussed the procedure including the risks, benefits and alternatives for the proposed anesthesia with the patient or authorized representative who has indicated his/her understanding and acceptance.     Plan Discussed with:   Anesthesia Plan Comments:         Anesthesia Quick Evaluation

## 2013-05-23 NOTE — Op Note (Signed)
05/23/2013  9:05 AM  PATIENT:  Judy Cross  47 y.o. female  PRE-OPERATIVE DIAGNOSIS:  MENORRHAGIA ENDOMETRIAL POLYP  POST-OPERATIVE DIAGNOSIS:  MENORRHAGIA ENDOMETRIAL POLYP  PROCEDURE:  Procedure(s): ENDOMETRIAL POLYP REMOVAL (N/A) DILATATION AND CURETTAGE /HYSTEROSCOPY (N/A) Details: patient was taken to the operation room , prepped and draped, and timeout conducted. Vaginal procedure planned.  Details of procedure: The patient was taken to the OR, prepped, draped, and timeout conducted with procedure confirmed by surgical team. Pre Op antibiotics were not administered. The Speculum was inserted, cervix grasped with single-tooth tenaculum, and the uterus sounded to 9 cm, in the  {anteflexed position, and the endocervical canal dilated to 23 [place], allowing introduction of the rigid 30 degree hysteroscope, which identified both tubal ostia. There was no indication of perforation. Additional findings:posterior polyp, broad based, excised with hysteroscopic biopsy scissors and extracted with Randall stone forceps Additional procedures:none Curetting was performed, minimal tissue sample,  Not sufficient to be sent for analysis.   Paracervical block with 10 cc of Marcaine injected at 3,5,7,and 9 o'clock. EBL : minimal.  Cervical tenaculum grasping site bled, so point cautery done with hemostasis obtiained. Condition to recovery: good Sponge counts: correct.

## 2013-05-26 ENCOUNTER — Encounter (HOSPITAL_COMMUNITY): Payer: Self-pay | Admitting: Obstetrics and Gynecology

## 2013-05-27 ENCOUNTER — Other Ambulatory Visit: Payer: Self-pay

## 2013-05-30 ENCOUNTER — Telehealth: Payer: Self-pay | Admitting: Obstetrics and Gynecology

## 2013-05-31 NOTE — Telephone Encounter (Signed)
Results called to pt

## 2013-06-04 ENCOUNTER — Encounter: Payer: Medicaid Other | Admitting: Obstetrics and Gynecology

## 2013-06-22 ENCOUNTER — Encounter (HOSPITAL_COMMUNITY): Payer: Self-pay | Admitting: Emergency Medicine

## 2013-06-22 ENCOUNTER — Emergency Department (HOSPITAL_COMMUNITY)
Admission: EM | Admit: 2013-06-22 | Discharge: 2013-06-22 | Disposition: A | Discharge status: Home / Self Care | Payer: Medicaid Other | Attending: Emergency Medicine | Admitting: Emergency Medicine

## 2013-06-22 DIAGNOSIS — H60399 Other infective otitis externa, unspecified ear: Secondary | ICD-10-CM | POA: Insufficient documentation

## 2013-06-22 DIAGNOSIS — M129 Arthropathy, unspecified: Secondary | ICD-10-CM | POA: Insufficient documentation

## 2013-06-22 DIAGNOSIS — Z880 Allergy status to penicillin: Secondary | ICD-10-CM | POA: Insufficient documentation

## 2013-06-22 DIAGNOSIS — L0202 Furuncle of face: Secondary | ICD-10-CM | POA: Insufficient documentation

## 2013-06-22 DIAGNOSIS — H6091 Unspecified otitis externa, right ear: Secondary | ICD-10-CM

## 2013-06-22 DIAGNOSIS — Z8742 Personal history of other diseases of the female genital tract: Secondary | ICD-10-CM | POA: Insufficient documentation

## 2013-06-22 DIAGNOSIS — G40909 Epilepsy, unspecified, not intractable, without status epilepticus: Secondary | ICD-10-CM | POA: Insufficient documentation

## 2013-06-22 DIAGNOSIS — F17200 Nicotine dependence, unspecified, uncomplicated: Secondary | ICD-10-CM | POA: Insufficient documentation

## 2013-06-22 DIAGNOSIS — Z79899 Other long term (current) drug therapy: Secondary | ICD-10-CM | POA: Insufficient documentation

## 2013-06-22 DIAGNOSIS — I10 Essential (primary) hypertension: Secondary | ICD-10-CM | POA: Insufficient documentation

## 2013-06-22 DIAGNOSIS — L0203 Carbuncle of face: Secondary | ICD-10-CM

## 2013-06-22 MED ORDER — CIPROFLOXACIN HCL 500 MG PO TABS: 500.0000 mg | ORAL_TABLET | Freq: Two times a day (BID) | ORAL | Status: DC

## 2013-06-22 MED ORDER — OXYCODONE-ACETAMINOPHEN 5-325 MG PO TABS
1.0000 | ORAL_TABLET | Freq: Once | ORAL | Status: AC
  Administered 2013-06-22: 1 via ORAL
  Filled 2013-06-22: qty 1

## 2013-06-22 MED ORDER — CIPROFLOXACIN HCL 250 MG PO TABS
750.0000 mg | ORAL_TABLET | Freq: Once | ORAL | Status: AC
  Administered 2013-06-22: 750 mg via ORAL
  Filled 2013-06-22: qty 3

## 2013-06-22 MED ORDER — NEOMYCIN-POLYMYXIN-HC 3.5-10000-1 OT SUSP: 2.0000 [drp] | Freq: Four times a day (QID) | OTIC | Status: DC

## 2013-06-22 MED ORDER — IBUPROFEN 800 MG PO TABS
800.0000 mg | ORAL_TABLET | Freq: Once | ORAL | Status: AC
Start: 2013-06-22 — End: 2013-06-22
  Administered 2013-06-22: 800 mg via ORAL
  Filled 2013-06-22: qty 1

## 2013-06-22 NOTE — Discharge Instructions (Signed)
You have an infection in your ear canal.   Antibiotic tablets for 10 days.   Also prescription for antibiotic eardrops. Tylenol or ibuprofen for pain

## 2013-06-22 NOTE — ED Notes (Signed)
MD at bedside. 

## 2013-06-22 NOTE — ED Notes (Signed)
Pt reports for the past month has had swelling to r side of face and temple area.  Reports pain in r temple radiating into neck and back.   Reports had a sweizure approx 1 1/2 months ago and hit head.

## 2013-06-22 NOTE — ED Provider Notes (Signed)
CSN: [id number]     Arrival date & time 06/22/13  1337 History  This chart was scribed for Nat Christen, MD by Esmond Camper, ED Scribe. This patient was seen in room [facility] and the patient's care was started at 2:27 PM.   Chief Complaint  Patient presents with  . Facial Swelling    The history is provided by the patient. No language interpreter was used.    HPI Comments: Judy Cross is a 48 y.o. female who presents to the Emergency Department complaining of moderate sharp pain to her right temporal area with associated mild swelling.  Pt reports that pain began one week ago and acutely worsened this morning.  She also states she hears a popping noise when she eats and feels a "knot" in her right ear when she inserts her finger into it.  She denies dental pain. No fever or chills. Severity is mild to moderate. She has tried nothing at home.  PCP is FANTA,TESFAYE, MD Neurologist is Dr. Merlene Laughter   Past Medical History  Diagnosis Date  . 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 Hypertension   . Seizures     last seizure was a year ago  . Arthritis   . Irregular bleeding 03/26/2013  . Hot flashes 03/26/2013  . Endometrial polyp 04/28/2013  . Fibroids 04/28/2013  . Seasonal allergies    Past Surgical History  Procedure Laterality Date  . Cesarean section      x 3  . Examination under anesthesia  02/28/2012    Procedure: EXAM UNDER ANESTHESIA;  Surgeon: Donato Heinz, MD;  Location: [facility];  Service: General;  Laterality: N/A;  . Sphincterotomy  02/28/2012    Procedure: SPHINCTEROTOMY;  Surgeon: Donato Heinz, MD;  Location: [facility];  Service: General;  Laterality: N/A;  Lateral Internal Sphincterotomy  . Trigger finger release    . Ganglion cyst excision    . Tubal ligation    . Polypectomy N/A 05/23/2013    Procedure: ENDOMETRIAL POLYP REMOVAL;  Surgeon: Jonnie Kind, MD;  Location: [facility];  Service: Gynecology;  Laterality: N/A;  . Hysteroscopy w/d&c N/A  05/23/2013    Procedure: DILATATION AND CURETTAGE /HYSTEROSCOPY;  Surgeon: Jonnie Kind, MD;  Location: [facility];  Service: Gynecology;  Laterality: N/A;   Family History  Problem Relation Age of Onset  . Hypertension Mother   . Hypertension Father    History  Substance Use Topics  . Smoking status: Current Some Day Smoker -- 0.25 packs/day for 20 years    Types: Cigarettes  . Smokeless tobacco: Never Used  . Alcohol Use: Yes     Comment: occasional   OB History   Grav Para Term Preterm Abortions TAB SAB Ect Mult Living   4 3   1  1   3       Review of Systems A complete 10 system review of systems was obtained and all systems are negative except as noted in the HPI and PMH.    Allergies  Penicillins  Home Medications   Current Outpatient Rx  Name  Route  Sig  Dispense  Refill  . albuterol (PROVENTIL HFA;VENTOLIN HFA) 108 (90 BASE) MCG/ACT inhaler   Inhalation   Inhale 4 puffs into the lungs at bedtime.          . cyclobenzaprine (FLEXERIL) 10 MG tablet   Oral   Take 10 mg by mouth at bedtime as needed for muscle spasms.         Marland Kitchen  gabapentin (NEURONTIN) 300 MG capsule   Oral   Take 300 mg by mouth 2 (two) times daily.          . hydrochlorothiazide 25 MG tablet   Oral   Take 25 mg by mouth daily.          Marland Kitchen ibuprofen (ADVIL,MOTRIN) 600 MG tablet   Oral   Take 1 tablet (600 mg total) by mouth every 6 (six) hours as needed for mild pain or moderate pain.   30 tablet   1   . levETIRAcetam (KEPPRA) 500 MG tablet   Oral   Take 500 mg by mouth 2 (two) times daily.          Marland Kitchen lisinopril (PRINIVIL,ZESTRIL) 20 MG tablet   Oral   Take 10 mg by mouth daily.          Marland Kitchen loratadine (CLARITIN) 10 MG tablet   Oral   Take 10 mg by mouth daily.         . potassium chloride (K-DUR) 10 MEQ tablet   Oral   Take 1 tablet (10 mEq total) by mouth 2 (two) times daily.   15 tablet   0    BP 162/103  Pulse 75  Temp(Src) 98.7 F (37.1 C) (Oral)  Resp 18   Ht 5\' 1"  (1.549 m)  Wt 167 lb (75.751 kg)  BMI 31.57 kg/m2  SpO2 99%  Physical Exam  Nursing note and vitals reviewed. Constitutional: She is oriented to person, place, and time. She appears well-developed and well-nourished.  HENT:  Head: Normocephalic and atraumatic.  Left Ear: Tympanic membrane and ear canal normal.  Tender in the anterior aspect of right ear canal.  Small boil developing in ear canal. Tender over right TMJ joint when chewing. Slight tenderness and minimal edema to right temple.  Eyes: Conjunctivae and EOM are normal. Pupils are equal, round, and reactive to light.  Neck: Normal range of motion. Neck supple.  Cardiovascular: Normal rate, regular rhythm and normal heart sounds.   Pulmonary/Chest: Effort normal and breath sounds normal.  Abdominal: Soft. Bowel sounds are normal.  Musculoskeletal: Normal range of motion.  Neurological: She is alert and oriented to person, place, and time.  Skin: Skin is warm and dry.  Psychiatric: She has a normal mood and affect. Her behavior is normal.    ED Course  Procedures (including critical care time)  DIAGNOSTIC STUDIES: Oxygen Saturation is 99% on room air, room air by my interpretation.    COORDINATION OF CARE: 2:30 PM-Discussed treatment plan which includes oral antibiotics, antibiotic ear drops, and pain medication with pt at bedside and pt agreed to plan.     Labs Review Labs Reviewed - No data to display  Imaging Review No results found.  EKG Interpretation   None       MDM  No diagnosis found. History and physical suggest right otitis externa c associated minor boil. Rx Cipro 500 twice a day and Cortisporin Otic.   I personally performed the services described in this documentation, which was scribed in my presence. The recorded information has been reviewed and is accurate.    Nat Christen, MD 06/24/13 [phone]

## 2013-06-25 ENCOUNTER — Emergency Department (HOSPITAL_COMMUNITY)
Admission: EM | Admit: 2013-06-25 | Discharge: 2013-06-25 | Disposition: A | Discharge status: Home / Self Care | Payer: Medicaid Other | Attending: Emergency Medicine | Admitting: Emergency Medicine

## 2013-06-25 ENCOUNTER — Encounter (HOSPITAL_COMMUNITY): Payer: Self-pay | Admitting: Emergency Medicine

## 2013-06-25 DIAGNOSIS — F17200 Nicotine dependence, unspecified, uncomplicated: Secondary | ICD-10-CM | POA: Insufficient documentation

## 2013-06-25 DIAGNOSIS — Z880 Allergy status to penicillin: Secondary | ICD-10-CM | POA: Insufficient documentation

## 2013-06-25 DIAGNOSIS — Z792 Long term (current) use of antibiotics: Secondary | ICD-10-CM | POA: Insufficient documentation

## 2013-06-25 DIAGNOSIS — G40909 Epilepsy, unspecified, not intractable, without status epilepticus: Secondary | ICD-10-CM | POA: Insufficient documentation

## 2013-06-25 DIAGNOSIS — IMO0002 Reserved for concepts with insufficient information to code with codable children: Secondary | ICD-10-CM | POA: Insufficient documentation

## 2013-06-25 DIAGNOSIS — M25519 Pain in unspecified shoulder: Secondary | ICD-10-CM | POA: Insufficient documentation

## 2013-06-25 DIAGNOSIS — Z8679 Personal history of other diseases of the circulatory system: Secondary | ICD-10-CM | POA: Insufficient documentation

## 2013-06-25 DIAGNOSIS — M436 Torticollis: Secondary | ICD-10-CM | POA: Insufficient documentation

## 2013-06-25 DIAGNOSIS — G44209 Tension-type headache, unspecified, not intractable: Secondary | ICD-10-CM

## 2013-06-25 DIAGNOSIS — Z79899 Other long term (current) drug therapy: Secondary | ICD-10-CM | POA: Insufficient documentation

## 2013-06-25 DIAGNOSIS — M129 Arthropathy, unspecified: Secondary | ICD-10-CM | POA: Insufficient documentation

## 2013-06-25 DIAGNOSIS — R51 Headache: Secondary | ICD-10-CM | POA: Insufficient documentation

## 2013-06-25 DIAGNOSIS — H9209 Otalgia, unspecified ear: Secondary | ICD-10-CM | POA: Insufficient documentation

## 2013-06-25 DIAGNOSIS — I10 Essential (primary) hypertension: Secondary | ICD-10-CM | POA: Insufficient documentation

## 2013-06-25 DIAGNOSIS — Z8739 Personal history of other diseases of the musculoskeletal system and connective tissue: Secondary | ICD-10-CM | POA: Insufficient documentation

## 2013-06-25 DIAGNOSIS — Z8742 Personal history of other diseases of the female genital tract: Secondary | ICD-10-CM | POA: Insufficient documentation

## 2013-06-25 DIAGNOSIS — Z9109 Other allergy status, other than to drugs and biological substances: Secondary | ICD-10-CM | POA: Insufficient documentation

## 2013-06-25 MED ORDER — METHOCARBAMOL 500 MG PO TABS: 500.0000 mg | ORAL_TABLET | Freq: Three times a day (TID) | ORAL | Status: DC

## 2013-06-25 MED ORDER — HYDROCODONE-ACETAMINOPHEN 5-325 MG PO TABS: 1.0000 | ORAL_TABLET | ORAL | Status: DC | PRN

## 2013-06-25 NOTE — Discharge Instructions (Signed)
Your examination is consistent with a muscle contraction headache. Please use the Robaxin which is a muscle relaxer, and the Norco which the pain medication for this problem. These medications will cause drowsiness, please use with caution. Please see Dr. Legrand Rams for additional evaluation if not improving.

## 2013-06-25 NOTE — ED Provider Notes (Signed)
CSN: [id number]     Arrival date & time 06/25/13  4270 History   First MD Initiated Contact with Patient 06/25/13 1001     Chief Complaint  Patient presents with  . Headache   (Consider location/radiation/quality/duration/timing/severity/associated sxs/prior Treatment) Patient is a 48 y.o. female presenting with headaches. The history is provided by the patient.  Headache Pain location:  L temporal, R temporal and occipital Quality:  Stabbing (cramping) Radiates to:  L shoulder and R shoulder Severity currently:  10/10 Onset quality:  Gradual Duration:  3 days Timing:  Intermittent Progression:  Worsening Similar to prior headaches: no   Context: emotional stress and straining   Context: not exposure to bright light and not loud noise   Relieved by:  Nothing Worsened by:  Nothing tried Associated symptoms: ear pain and neck stiffness   Associated symptoms: no abdominal pain, no back pain, no cough, no dizziness, no loss of balance, no myalgias, no nausea, no near-syncope, no neck pain, no numbness, no photophobia, no seizures, no syncope, no vomiting and no weakness     Past Medical History  Diagnosis Date  . 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 Hypertension   . Seizures     last seizure was a year ago  . Arthritis   . Irregular bleeding 03/26/2013  . Hot flashes 03/26/2013  . Endometrial polyp 04/28/2013  . Fibroids 04/28/2013  . Seasonal allergies    Past Surgical History  Procedure Laterality Date  . Cesarean section      x 3  . Examination under anesthesia  02/28/2012    Procedure: EXAM UNDER ANESTHESIA;  Surgeon: Donato Heinz, MD;  Location: [facility];  Service: General;  Laterality: N/A;  . Sphincterotomy  02/28/2012    Procedure: SPHINCTEROTOMY;  Surgeon: Donato Heinz, MD;  Location: [facility];  Service: General;  Laterality: N/A;  Lateral Internal Sphincterotomy  . Trigger finger release    . Ganglion cyst excision    . Tubal ligation    . Polypectomy  N/A 05/23/2013    Procedure: ENDOMETRIAL POLYP REMOVAL;  Surgeon: Jonnie Kind, MD;  Location: [facility];  Service: Gynecology;  Laterality: N/A;  . Hysteroscopy w/d&c N/A 05/23/2013    Procedure: DILATATION AND CURETTAGE /HYSTEROSCOPY;  Surgeon: Jonnie Kind, MD;  Location: [facility];  Service: Gynecology;  Laterality: N/A;   Family History  Problem Relation Age of Onset  . Hypertension Mother   . Hypertension Father    History  Substance Use Topics  . Smoking status: Current Some Day Smoker -- 0.25 packs/day for 20 years    Types: Cigarettes  . Smokeless tobacco: Never Used  . Alcohol Use: Yes     Comment: occasional   OB History   Grav Para Term Preterm Abortions TAB SAB Ect Mult Living   4 3   1  1   3      Review of Systems  Constitutional: Negative for activity change.       All ROS Neg except as noted in HPI  HENT: Positive for ear pain. Negative for nosebleeds.   Eyes: Negative for photophobia and discharge.  Respiratory: Negative for cough, shortness of breath and wheezing.   Cardiovascular: Negative for chest pain, palpitations, syncope and near-syncope.  Gastrointestinal: Negative for nausea, vomiting, abdominal pain and blood in stool.  Genitourinary: Negative for dysuria, frequency and hematuria.  Musculoskeletal: Positive for neck stiffness. Negative for arthralgias, back pain, myalgias and neck pain.  Skin: Negative.  Neurological: Positive for headaches. Negative for dizziness, seizures, speech difficulty, numbness and loss of balance.  Psychiatric/Behavioral: Negative for hallucinations and confusion.    Allergies  Penicillins  Home Medications   Current Outpatient Rx  Name  Route  Sig  Dispense  Refill  . albuterol (PROVENTIL HFA;VENTOLIN HFA) 108 (90 BASE) MCG/ACT inhaler   Inhalation   Inhale 4 puffs into the lungs at bedtime.          . ciprofloxacin (CIPRO) 500 MG tablet   Oral   Take 1 tablet (500 mg total) by mouth 2 (two) times daily.    20 tablet   0   . cyclobenzaprine (FLEXERIL) 10 MG tablet   Oral   Take 10 mg by mouth at bedtime as needed for muscle spasms.         Marland Kitchen gabapentin (NEURONTIN) 300 MG capsule   Oral   Take 300 mg by mouth 2 (two) times daily.          . hydrochlorothiazide 25 MG tablet   Oral   Take 25 mg by mouth daily.          Marland Kitchen ibuprofen (ADVIL,MOTRIN) 600 MG tablet   Oral   Take 1 tablet (600 mg total) by mouth every 6 (six) hours as needed for mild pain or moderate pain.   30 tablet   1   . levETIRAcetam (KEPPRA) 500 MG tablet   Oral   Take 500 mg by mouth 2 (two) times daily.          Marland Kitchen lisinopril (PRINIVIL,ZESTRIL) 20 MG tablet   Oral   Take 10 mg by mouth daily.          Marland Kitchen loratadine (CLARITIN) 10 MG tablet   Oral   Take 10 mg by mouth daily.         Marland Kitchen neomycin-polymyxin-hydrocortisone (CORTISPORIN) 3.5-10000-1 otic suspension   Right Ear   Place 2 drops into the right ear 4 (four) times daily. X 7 days   10 mL   0   . potassium chloride (K-DUR) 10 MEQ tablet   Oral   Take 1 tablet (10 mEq total) by mouth 2 (two) times daily.   15 tablet   0    BP 125/90  Pulse 58  Temp(Src) 98.2 F (36.8 C) (Oral)  Resp 18  SpO2 100% Physical Exam  Nursing note and vitals reviewed. Constitutional: She is oriented to person, place, and time. She appears well-developed and well-nourished.  Non-toxic appearance.  HENT:  Head: Normocephalic.  Right Ear: Tympanic membrane and external ear normal.  Left Ear: Tympanic membrane and external ear normal.  There is increased prominence of the scalp muscles on the right and the left extending to the occipital area. There is no increased redness behind the right ear. The previous infection was being treated. The no periauricular nodes appreciated. There is minimal drainage from the right ear. There is a healing abscess of the anterior external auditory canals.  Eyes: EOM and lids are normal. Pupils are equal, round, and reactive to  light.  Neck: Normal range of motion. Neck supple. Carotid bruit is not present.  Cardiovascular: Normal rate, regular rhythm, normal heart sounds, intact distal pulses and normal pulses.   Pulmonary/Chest: Breath sounds normal. No respiratory distress.  Abdominal: Soft. Bowel sounds are normal. There is no tenderness. There is no guarding.  Musculoskeletal: Normal range of motion.  Lymphadenopathy:       Head (right side): No submandibular adenopathy present.  Head (left side): No submandibular adenopathy present.    She has no cervical adenopathy.  Neurological: She is alert and oriented to person, place, and time. She has normal strength. No cranial nerve deficit or sensory deficit. She exhibits normal muscle tone. Coordination normal.  Gait steady.  Skin: Skin is warm and dry.  Psychiatric: She has a normal mood and affect. Her speech is normal.    ED Course  Procedures (including critical care time) Labs Review Labs Reviewed - No data to display Imaging Review No results found.  EKG Interpretation   None       MDM  No diagnosis found. **I have reviewed nursing notes, vital signs, and all appropriate lab and imaging results for this patient.*  There no gross neurologic deficits appreciated. There's no evidence of enhancement of the infection that was previously diagnosed in the right ear. The gait is steady. Speech is clear and understandable. The examination is consistent with a muscle contraction headache extending into the right and left shoulders. The patient is treated with Robaxin and Norco. Patient advised to see her primary physician, or return to the emergency department if not improving.  Lenox Ahr, PA-C 06/25/13 1055

## 2013-06-25 NOTE — ED Notes (Signed)
Pt here for same Sunday. C/o pain and swelling to right side of face. Was dx with ear infection.has been taking rx meds. States pain and swelling is worse. No obvious swelling noted. Nad.

## 2013-06-25 NOTE — ED Notes (Signed)
Pt recently diagnosed with r ear infection.  Was here Sunday and for pain and swelling in r side of face and head.  No obvious swelling noted.

## 2013-06-25 NOTE — ED Provider Notes (Signed)
Medical screening examination/treatment/procedure(s) were performed by non-physician practitioner and as supervising physician I was immediately available for consultation/collaboration.  EKG Interpretation   None         Ezequiel Essex, MD 06/25/13 [phone]

## 2013-07-23 ENCOUNTER — Other Ambulatory Visit (HOSPITAL_COMMUNITY): Payer: Self-pay | Admitting: Orthopaedic Surgery

## 2013-07-23 DIAGNOSIS — M545 Low back pain, unspecified: Secondary | ICD-10-CM

## 2013-07-25 ENCOUNTER — Ambulatory Visit (HOSPITAL_COMMUNITY): Payer: Medicaid Other

## 2013-07-29 ENCOUNTER — Ambulatory Visit (HOSPITAL_COMMUNITY): Admission: RE | Admit: 2013-07-29 | Payer: Medicaid Other | Source: Ambulatory Visit

## 2013-08-01 ENCOUNTER — Encounter (HOSPITAL_COMMUNITY): Payer: Self-pay

## 2013-08-01 ENCOUNTER — Ambulatory Visit (HOSPITAL_COMMUNITY)
Admission: RE | Admit: 2013-08-01 | Discharge: 2013-08-01 | Disposition: A | Discharge status: Home / Self Care | Payer: Medicaid Other | Source: Ambulatory Visit | Attending: Orthopaedic Surgery | Admitting: Orthopaedic Surgery

## 2013-08-01 DIAGNOSIS — M545 Low back pain, unspecified: Secondary | ICD-10-CM

## 2013-08-01 DIAGNOSIS — R209 Unspecified disturbances of skin sensation: Secondary | ICD-10-CM | POA: Insufficient documentation

## 2013-08-01 DIAGNOSIS — M5126 Other intervertebral disc displacement, lumbar region: Secondary | ICD-10-CM | POA: Insufficient documentation

## 2013-09-04 ENCOUNTER — Ambulatory Visit (INDEPENDENT_AMBULATORY_CARE_PROVIDER_SITE_OTHER): Payer: Medicaid Other | Admitting: Otolaryngology

## 2013-09-04 DIAGNOSIS — H93299 Other abnormal auditory perceptions, unspecified ear: Secondary | ICD-10-CM

## 2013-09-04 DIAGNOSIS — H9209 Otalgia, unspecified ear: Secondary | ICD-10-CM

## 2013-09-18 ENCOUNTER — Other Ambulatory Visit (HOSPITAL_COMMUNITY): Payer: Self-pay | Admitting: Internal Medicine

## 2013-09-18 DIAGNOSIS — Z1231 Encounter for screening mammogram for malignant neoplasm of breast: Secondary | ICD-10-CM

## 2013-10-16 ENCOUNTER — Ambulatory Visit (HOSPITAL_COMMUNITY)
Admission: RE | Admit: 2013-10-16 | Discharge: 2013-10-16 | Disposition: A | Discharge status: Home / Self Care | Payer: Medicaid Other | Source: Ambulatory Visit | Attending: Internal Medicine | Admitting: Internal Medicine

## 2013-10-16 DIAGNOSIS — Z1231 Encounter for screening mammogram for malignant neoplasm of breast: Secondary | ICD-10-CM | POA: Insufficient documentation

## 2013-10-28 ENCOUNTER — Encounter (HOSPITAL_COMMUNITY): Payer: Self-pay | Admitting: Emergency Medicine

## 2013-10-28 ENCOUNTER — Emergency Department (HOSPITAL_COMMUNITY)
Admission: EM | Admit: 2013-10-28 | Discharge: 2013-10-28 | Disposition: A | Discharge status: Home / Self Care | Payer: Medicaid Other | Attending: Emergency Medicine | Admitting: Emergency Medicine

## 2013-10-28 DIAGNOSIS — M545 Low back pain, unspecified: Secondary | ICD-10-CM | POA: Insufficient documentation

## 2013-10-28 DIAGNOSIS — Z7902 Long term (current) use of antithrombotics/antiplatelets: Secondary | ICD-10-CM | POA: Insufficient documentation

## 2013-10-28 DIAGNOSIS — Z8709 Personal history of other diseases of the respiratory system: Secondary | ICD-10-CM | POA: Insufficient documentation

## 2013-10-28 DIAGNOSIS — Z79899 Other long term (current) drug therapy: Secondary | ICD-10-CM | POA: Insufficient documentation

## 2013-10-28 DIAGNOSIS — Z8742 Personal history of other diseases of the female genital tract: Secondary | ICD-10-CM | POA: Insufficient documentation

## 2013-10-28 DIAGNOSIS — F17200 Nicotine dependence, unspecified, uncomplicated: Secondary | ICD-10-CM | POA: Insufficient documentation

## 2013-10-28 DIAGNOSIS — M129 Arthropathy, unspecified: Secondary | ICD-10-CM | POA: Insufficient documentation

## 2013-10-28 DIAGNOSIS — I10 Essential (primary) hypertension: Secondary | ICD-10-CM | POA: Insufficient documentation

## 2013-10-28 DIAGNOSIS — Z880 Allergy status to penicillin: Secondary | ICD-10-CM | POA: Insufficient documentation

## 2013-10-28 DIAGNOSIS — Z791 Long term (current) use of non-steroidal anti-inflammatories (NSAID): Secondary | ICD-10-CM | POA: Insufficient documentation

## 2013-10-28 DIAGNOSIS — M549 Dorsalgia, unspecified: Secondary | ICD-10-CM

## 2013-10-28 DIAGNOSIS — G40909 Epilepsy, unspecified, not intractable, without status epilepticus: Secondary | ICD-10-CM | POA: Insufficient documentation

## 2013-10-28 MED ORDER — HYDROCODONE-ACETAMINOPHEN 5-325 MG PO TABS: 1.0000 | ORAL_TABLET | ORAL | Status: DC | PRN

## 2013-10-28 MED ORDER — DIAZEPAM 5 MG PO TABS
5.0000 mg | ORAL_TABLET | Freq: Once | ORAL | Status: AC
  Administered 2013-10-28: 5 mg via ORAL
  Filled 2013-10-28: qty 1

## 2013-10-28 MED ORDER — DEXAMETHASONE 4 MG PO TABS: ORAL_TABLET | ORAL | Status: DC

## 2013-10-28 MED ORDER — ONDANSETRON HCL 4 MG PO TABS
4.0000 mg | ORAL_TABLET | Freq: Once | ORAL | Status: AC
  Administered 2013-10-28: 4 mg via ORAL
  Filled 2013-10-28: qty 1

## 2013-10-28 MED ORDER — BACLOFEN 10 MG PO TABS: 10.0000 mg | ORAL_TABLET | Freq: Three times a day (TID) | ORAL | Status: AC

## 2013-10-28 MED ORDER — HYDROCODONE-ACETAMINOPHEN 5-325 MG PO TABS
2.0000 | ORAL_TABLET | Freq: Once | ORAL | Status: AC
  Administered 2013-10-28: 2 via ORAL
  Filled 2013-10-28: qty 2

## 2013-10-28 MED ORDER — DEXAMETHASONE SODIUM PHOSPHATE 4 MG/ML IJ SOLN
8.0000 mg | Freq: Once | INTRAMUSCULAR | Status: AC
  Administered 2013-10-28: 8 mg via INTRAMUSCULAR
  Filled 2013-10-28: qty 2

## 2013-10-28 NOTE — Discharge Instructions (Signed)
Please call Dr. Legrand Rams for Back Pain, Adult Low back pain is very common. About 1 in 5 people have back pain.The cause of low back pain is rarely dangerous. The pain often gets better over time.About half of people with a sudden onset of back pain feel better in just 2 weeks. About 8 in 10 people feel better by 6 weeks.  CAUSES Some common causes of back pain include:  Strain of the muscles or ligaments supporting the spine.  Wear and tear (degeneration) of the spinal discs.  Arthritis.  Direct injury to the back. DIAGNOSIS Most of the time, the direct cause of low back pain is not known.However, back pain can be treated effectively even when the exact cause of the pain is unknown.Answering your caregiver's questions about your overall health and symptoms is one of the most accurate ways to make sure the cause of your pain is not dangerous. If your caregiver needs more information, he or she may order lab work or imaging tests (X-rays or MRIs).However, even if imaging tests show changes in your back, this usually does not require surgery. HOME CARE INSTRUCTIONS For many people, back pain returns.Since low back pain is rarely dangerous, it is often a condition that people can learn to [facility] their own.   Remain active. It is stressful on the back to sit or stand in one place. Do not sit, drive, or stand in one place for more than 30 minutes at a time. Take short walks on level surfaces as soon as pain allows.Try to increase the length of time you walk each day.  Do not stay in bed.Resting more than 1 or 2 days can delay your recovery.  Do not avoid exercise or work.Your body is made to move.It is not dangerous to be active, even though your back may hurt.Your back will likely heal faster if you return to being active before your pain is gone.  Pay attention to your body when you bend and lift. Many people have less discomfortwhen lifting if they bend their knees, keep the load  close to their bodies,and avoid twisting. Often, the most comfortable positions are those that put less stress on your recovering back.  Find a comfortable position to sleep. Use a firm mattress and lie on your side with your knees slightly bent. If you lie on your back, put a pillow under your knees.  Only take over-the-counter or prescription medicines as directed by your caregiver. Over-the-counter medicines to reduce pain and inflammation are often the most helpful.Your caregiver may prescribe muscle relaxant drugs.These medicines help dull your pain so you can more quickly return to your normal activities and healthy exercise.  Put ice on the injured area.  Put ice in a plastic bag.  Place a towel between your skin and the bag.  Leave the ice on for 15-20 minutes, 03-04 times a day for the first 2 to 3 days. After that, ice and heat may be alternated to reduce pain and spasms.  Ask your caregiver about trying back exercises and gentle massage. This may be of some benefit.  Avoid feeling anxious or stressed.Stress increases muscle tension and can worsen back pain.It is important to recognize when you are anxious or stressed and learn ways to manage it.Exercise is a great option. SEEK MEDICAL CARE IF:  You have pain that is not relieved with rest or medicine.  You have pain that does not improve in 1 week.  You have new symptoms.  You are  generally not feeling well. SEEK IMMEDIATE MEDICAL CARE IF:   You have pain that radiates from your back into your legs.  You develop new bowel or bladder control problems.  You have unusual weakness or numbness in your arms or legs.  You develop nausea or vomiting.  You develop abdominal pain.  You feel faint. Document Released: 05/29/2005 Document Revised: 11/28/2011 Document Reviewed: 10/17/2010 [facility] Patient Information 2014 [place].  continuation of the workup of your back problem. Please rest your back is much as  possible, apply heating pad. Use baclofen and Decadron daily. Use Norco if needed for pain. Baclofen and Norco may cause drowsiness, please use with caution.

## 2013-10-28 NOTE — ED Notes (Signed)
Chronic back pain with flare up yesterday.  Pain radiating down left leg, causing numbness to left leg and up back into neck.  No new injury.

## 2013-10-28 NOTE — ED Provider Notes (Signed)
CSN: [id number]     Arrival date & time 10/28/13  0820 History   First MD Initiated Contact with Patient 10/28/13 0827     Chief Complaint  Patient presents with  . Back Pain     (Consider location/radiation/quality/duration/timing/severity/associated sxs/prior Treatment) HPI Comments: Pt has a hx of chronic back pain. Yesterday she noted pain the went up to her neck. This is worse after mowing the grass recently.Her usual pain goes to the right leg. She reports difficulty with walking, but this is not new, what is new is the pain moving to the left leg. She reports numbness of the left leg. No history of fall or head injury, or new back injury. She is seen by Dr Luna Glasgow for her back. She is treated with medications, but states this is no longer working.  Patient is a 48 y.o. female presenting with back pain. The history is provided by the patient.  Back Pain Associated symptoms: no abdominal pain, no chest pain and no dysuria     Past Medical History  Diagnosis Date  . 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 Hypertension   . Seizures     last seizure was a year ago  . Arthritis   . Irregular bleeding 03/26/2013  . Hot flashes 03/26/2013  . Endometrial polyp 04/28/2013  . Fibroids 04/28/2013  . Seasonal allergies    Past Surgical History  Procedure Laterality Date  . Cesarean section      x 3  . Examination under anesthesia  02/28/2012    Procedure: EXAM UNDER ANESTHESIA;  Surgeon: Donato Heinz, MD;  Location: [facility];  Service: General;  Laterality: N/A;  . Sphincterotomy  02/28/2012    Procedure: SPHINCTEROTOMY;  Surgeon: Donato Heinz, MD;  Location: [facility];  Service: General;  Laterality: N/A;  Lateral Internal Sphincterotomy  . Trigger finger release    . Ganglion cyst excision    . Tubal ligation    . Polypectomy N/A 05/23/2013    Procedure: ENDOMETRIAL POLYP REMOVAL;  Surgeon: Jonnie Kind, MD;  Location: [facility];  Service: Gynecology;  Laterality: N/A;  .  Hysteroscopy w/d&c N/A 05/23/2013    Procedure: DILATATION AND CURETTAGE /HYSTEROSCOPY;  Surgeon: Jonnie Kind, MD;  Location: [facility];  Service: Gynecology;  Laterality: N/A;   Family History  Problem Relation Age of Onset  . Hypertension Mother   . Hypertension Father    History  Substance Use Topics  . Smoking status: Current Every Day Smoker -- 0.25 packs/day for 20 years    Types: Cigarettes  . Smokeless tobacco: Never Used  . Alcohol Use: Yes     Comment: occasional   OB History   Grav Para Term Preterm Abortions TAB SAB Ect Mult Living   4 3   1  1   3      Review of Systems  Constitutional: Negative for activity change.       All ROS Neg except as noted in HPI  HENT: Negative for nosebleeds.   Eyes: Negative for photophobia and discharge.  Respiratory: Negative for cough, shortness of breath and wheezing.   Cardiovascular: Negative for chest pain and palpitations.  Gastrointestinal: Negative for abdominal pain and blood in stool.  Genitourinary: Negative for dysuria, frequency and hematuria.  Musculoskeletal: Positive for arthralgias and back pain. Negative for neck pain.  Skin: Negative.   Neurological: Positive for seizures. Negative for dizziness and speech difficulty.  Psychiatric/Behavioral: Negative for hallucinations and confusion.  Allergies  Penicillins  Home Medications   Prior to Admission medications   Medication Sig Start Date End Date Taking? Authorizing Provider  albuterol (PROVENTIL HFA;VENTOLIN HFA) 108 (90 BASE) MCG/ACT inhaler Inhale 4 puffs into the lungs at bedtime.     Historical Provider, MD  ciprofloxacin (CIPRO) 500 MG tablet Take 1 tablet (500 mg total) by mouth 2 (two) times daily. 06/22/13   Nat Christen, MD  cyclobenzaprine (FLEXERIL) 10 MG tablet Take 10 mg by mouth at bedtime as needed for muscle spasms.    Historical Provider, MD  gabapentin (NEURONTIN) 300 MG capsule Take 300 mg by mouth 2 (two) times daily.     Historical  Provider, MD  hydrochlorothiazide 25 MG tablet Take 25 mg by mouth daily.     Historical Provider, MD  HYDROcodone-acetaminophen (NORCO) 5-325 MG per tablet Take 1 tablet by mouth every 4 (four) hours as needed for moderate pain. 06/25/13   Lenox Ahr, PA-C  ibuprofen (ADVIL,MOTRIN) 600 MG tablet Take 1 tablet (600 mg total) by mouth every 6 (six) hours as needed for mild pain or moderate pain. 05/23/13   Jonnie Kind, MD  levETIRAcetam (KEPPRA) 500 MG tablet Take 500 mg by mouth 2 (two) times daily.     Historical Provider, MD  lisinopril (PRINIVIL,ZESTRIL) 20 MG tablet Take 10 mg by mouth daily.     Historical Provider, MD  loratadine (CLARITIN) 10 MG tablet Take 10 mg by mouth daily.    Historical Provider, MD  methocarbamol (ROBAXIN) 500 MG tablet Take 1 tablet (500 mg total) by mouth 3 (three) times daily. 06/25/13   Lenox Ahr, PA-C  neomycin-polymyxin-hydrocortisone (CORTISPORIN) 3.5-10000-1 otic suspension Place 2 drops into the right ear 4 (four) times daily. X 7 days 06/22/13   Nat Christen, MD  potassium chloride (K-DUR) 10 MEQ tablet Take 1 tablet (10 mEq total) by mouth 2 (two) times daily. 05/11/13   Veryl Speak, MD   BP 131/88  Pulse 77  Temp(Src) 98.5 F (36.9 C) (Oral)  Resp 16  SpO2 99% Physical Exam  Nursing note and vitals reviewed. Constitutional: She is oriented to person, place, and time. She appears well-developed and well-nourished.  Non-toxic appearance.  HENT:  Head: Normocephalic.  Right Ear: Tympanic membrane and external ear normal.  Left Ear: Tympanic membrane and external ear normal.  Eyes: EOM and lids are normal. Pupils are equal, round, and reactive to light.  Neck: Normal range of motion. Neck supple. Carotid bruit is not present.  Cardiovascular: Normal rate, regular rhythm, normal heart sounds, intact distal pulses and normal pulses.   Pulmonary/Chest: Breath sounds normal. No respiratory distress.  Abdominal: Soft. Bowel sounds are normal.  There is no tenderness. There is no guarding.  Musculoskeletal: Normal range of motion.  Tense and tightness of the upper  Pain with ROM of the shoulder, right more than left. No palpable step off of the cervical spine.  Pain with ROM of the lumbar spine. No palpable step off. No hot area.  Lymphadenopathy:       Head (right side): No submandibular adenopathy present.       Head (left side): No submandibular adenopathy present.    She has no cervical adenopathy.  Neurological: She is alert and oriented to person, place, and time. She has normal strength. She displays no atrophy and no tremor. No cranial nerve deficit or sensory deficit. She exhibits normal muscle tone. Coordination and gait normal.  Gait is slow but steady. No foot drop.  Skin: Skin is warm and dry.  Psychiatric: She has a normal mood and affect. Her speech is normal.    ED Course  Procedures (including critical care time) Labs Review Labs Reviewed - No data to display  Imaging Review No results found.   EKG Interpretation None      MDM Patient has history of back related problems. She feels that she is having a flareup that started on yesterday. She also feels that her symptoms are increasing because the pain usually goes down her right leg, she now has pain going down the left leg and also up to the neck. There no gross neurologic deficits appreciated on today's examination.  Prescription for Decadron and Norco and baclofen given to the patient. Patient is to follow up with her primary physician for additional evaluation of these changes. Patient will return to the emergency department if any emergent changes or problems.    Final diagnoses:  None    **I have reviewed nursing notes, vital signs, and all appropriate lab and imaging results for this patient.Lenox Ahr, PA-C 10/28/13 1022

## 2013-11-01 NOTE — ED Provider Notes (Signed)
Medical screening examination/treatment/procedure(s) were performed by non-physician practitioner and as supervising physician I was immediately available for consultation/collaboration.   EKG Interpretation None        Fredia Sorrow, MD 11/01/13 [phone]

## 2013-11-13 ENCOUNTER — Ambulatory Visit (HOSPITAL_COMMUNITY): Payer: Medicaid Other | Admitting: Physical Therapy

## 2013-11-17 ENCOUNTER — Ambulatory Visit (HOSPITAL_COMMUNITY): Payer: Medicaid Other | Admitting: Physical Therapy

## 2013-12-08 ENCOUNTER — Ambulatory Visit (HOSPITAL_COMMUNITY)
Admission: RE | Admit: 2013-12-08 | Discharge: 2013-12-08 | Disposition: A | Discharge status: Home / Self Care | Payer: Medicaid Other | Source: Ambulatory Visit | Attending: Internal Medicine | Admitting: Internal Medicine

## 2013-12-08 DIAGNOSIS — R262 Difficulty in walking, not elsewhere classified: Secondary | ICD-10-CM | POA: Diagnosis not present

## 2013-12-08 DIAGNOSIS — M545 Low back pain, unspecified: Secondary | ICD-10-CM | POA: Insufficient documentation

## 2013-12-08 DIAGNOSIS — M5441 Lumbago with sciatica, right side: Secondary | ICD-10-CM

## 2013-12-08 DIAGNOSIS — IMO0001 Reserved for inherently not codable concepts without codable children: Secondary | ICD-10-CM | POA: Insufficient documentation

## 2013-12-08 DIAGNOSIS — M6281 Muscle weakness (generalized): Secondary | ICD-10-CM | POA: Insufficient documentation

## 2013-12-08 NOTE — Evaluation (Signed)
Physical Therapy Evaluation  Patient Details  Name: Judy Cross MRN: [id number] Date of Birth: [birth year]  Today's Date: 12/08/2013 Time: 0945-1020 PT Time Calculation (min): 35 min Charge:  Evaluation [id number]             Visit#: 1 of 1   Assessment Diagnosis: Lown back pain  Authorization: medicaid    Past Medical History:  Past Medical History  Diagnosis Date  . 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 Hypertension   . Seizures     last seizure was a year ago  . Arthritis   . Irregular bleeding 03/26/2013  . Hot flashes 03/26/2013  . Endometrial polyp 04/28/2013  . Fibroids 04/28/2013  . Seasonal allergies    Past Surgical History:  Past Surgical History  Procedure Laterality Date  . Cesarean section      x 3  . Examination under anesthesia  02/28/2012    Procedure: EXAM UNDER ANESTHESIA;  Surgeon: Donato Heinz, MD;  Location: [facility];  Service: General;  Laterality: N/A;  . Sphincterotomy  02/28/2012    Procedure: SPHINCTEROTOMY;  Surgeon: Donato Heinz, MD;  Location: [facility];  Service: General;  Laterality: N/A;  Lateral Internal Sphincterotomy  . Trigger finger release    . Ganglion cyst excision    . Tubal ligation    . Polypectomy N/A 05/23/2013    Procedure: ENDOMETRIAL POLYP REMOVAL;  Surgeon: Jonnie Kind, MD;  Location: [facility];  Service: Gynecology;  Laterality: N/A;  . Hysteroscopy w/d&c N/A 05/23/2013    Procedure: DILATATION AND CURETTAGE /HYSTEROSCOPY;  Surgeon: Jonnie Kind, MD;  Location: [facility];  Service: Gynecology;  Laterality: N/A;    Subjective Symptoms/Limitations Symptoms: Ms Judy Cross states she has had progressive pain for the past five years.   She  states that she has pain from her neck to her back and pain going down her leg to the toes.  She also experiences numbness in her leg..  Her pain is at a 9/10 she is calm and relaxed sitting in poor posture.  How long can you sit comfortably?: able to sit for 15 minutes How long can  you stand comfortably?: leg is numb 15 minutes How long can you walk comfortably?: 15 minutes leg wants to buckle Pain Assessment Currently in Pain?: Yes Pain Location: Back Pain Orientation: Right Pain Radiating Towards: to toes  Pain Onset: More than a month ago Pain Frequency: Constant Pain Relieving Factors: nothing Effect of Pain on Daily Activities: increases pain   Balance Screening Balance Screen Has the patient fallen in the past 6 months: No  Prior Functioning  Prior Function Vocation: Unemployed Leisure: Hobbies-yes (Comment) Comments: color, sew  Assessment RLE Assessment RLE Assessment: Within Functional Limits LLE Assessment LLE Assessment: Within Functional Limits Lumbar AROM Overall Lumbar AROM: Within functional limits for tasks performed  Exercise/Treatments     Stretches Active Hamstring Stretch: 2 reps;30 seconds Single Knee to Chest Stretch: 2 reps;30 seconds Standing Extension: 5 reps Prone on Elbows Stretch: 60 seconds;1 rep Press Ups: 5 reps   Supine Ab Set: 5 reps Bridge: 5 reps Straight Leg Raise: 5 reps Sidelying   Prone  Straight Leg Raise: 5 reps Opposite Arm/Leg Raise: 5 reps  Physical Therapy Assessment and Plan PT Assessment and Plan Clinical Impression Statement: Pt is a 48 yo with chronic low back pain that radiates into her Rt foot.  Judy Cross has had MRI's which show disc protrusion but they are not severe enough  for surgery.  She has had therapy before and states her MD has given her exercises before but she fails to complete them.  She demonstrates poor sitting posture in our interview.  Therapist consulted with pt and explained how important keeping good posture and completing her exercises are to the health of her back.  We discussed that if she did not start turning this aroung the pain would continue to progress.   Pt will benefit from skilled therapeutic intervention in order to improve on the following deficits:  Decreased activity tolerance;Pain;Difficulty walking;Decreased strength Rehab Potential: Fair PT Plan: one time HEP as pt insurance does not cover treatments.    Goals Home Exercise Program Judy Cross will Perform Home Exercise Program: For increased strengthening;For increased ROM PT Goal: Perform Home Exercise Program - Progress: Goal set today  Problem List Patient Active Problem List   Diagnosis Date Noted  . Low back pain 12/08/2013  . Fibroids 04/28/2013  . Irregular bleeding 03/26/2013  . Hot flashes 03/26/2013  . Enlarged uterus 03/26/2013    PT - End of Session Equipment Utilized During Treatment: Other (comment) General Behavior During Therapy: [facility] for tasks assessed/performed PT Plan of Care PT Home Exercise Plan: given  GP    RUSSELL,CINDY 12/08/2013, 10:31 AM  Physician Documentation Your signature is required to indicate approval of the treatment plan as stated above.  Please sign and either send electronically or make a copy of this report for your files and return this physician signed original.   Please mark one 1.__approve of plan  2. ___approve of plan with the following conditions.   ______________________________                                                          _____________________ Physician Signature                                                                                                             Date

## 2013-12-25 ENCOUNTER — Emergency Department (HOSPITAL_COMMUNITY)
Admission: EM | Admit: 2013-12-25 | Discharge: 2013-12-25 | Disposition: A | Discharge status: Home / Self Care | Payer: Medicaid Other | Attending: Emergency Medicine | Admitting: Emergency Medicine

## 2013-12-25 ENCOUNTER — Encounter (HOSPITAL_COMMUNITY): Payer: Self-pay | Admitting: Emergency Medicine

## 2013-12-25 DIAGNOSIS — Y929 Unspecified place or not applicable: Secondary | ICD-10-CM | POA: Insufficient documentation

## 2013-12-25 DIAGNOSIS — Z880 Allergy status to penicillin: Secondary | ICD-10-CM | POA: Diagnosis not present

## 2013-12-25 DIAGNOSIS — M129 Arthropathy, unspecified: Secondary | ICD-10-CM | POA: Insufficient documentation

## 2013-12-25 DIAGNOSIS — F17200 Nicotine dependence, unspecified, uncomplicated: Secondary | ICD-10-CM | POA: Diagnosis not present

## 2013-12-25 DIAGNOSIS — I10 Essential (primary) hypertension: Secondary | ICD-10-CM | POA: Insufficient documentation

## 2013-12-25 DIAGNOSIS — G40909 Epilepsy, unspecified, not intractable, without status epilepticus: Secondary | ICD-10-CM | POA: Insufficient documentation

## 2013-12-25 DIAGNOSIS — G8929 Other chronic pain: Secondary | ICD-10-CM | POA: Insufficient documentation

## 2013-12-25 DIAGNOSIS — X58XXXA Exposure to other specified factors, initial encounter: Secondary | ICD-10-CM | POA: Diagnosis not present

## 2013-12-25 DIAGNOSIS — Y939 Activity, unspecified: Secondary | ICD-10-CM | POA: Insufficient documentation

## 2013-12-25 DIAGNOSIS — Z79899 Other long term (current) drug therapy: Secondary | ICD-10-CM | POA: Diagnosis not present

## 2013-12-25 DIAGNOSIS — S29012A Strain of muscle and tendon of back wall of thorax, initial encounter: Secondary | ICD-10-CM

## 2013-12-25 DIAGNOSIS — Z8742 Personal history of other diseases of the female genital tract: Secondary | ICD-10-CM | POA: Diagnosis not present

## 2013-12-25 DIAGNOSIS — M549 Dorsalgia, unspecified: Secondary | ICD-10-CM | POA: Diagnosis present

## 2013-12-25 DIAGNOSIS — M5430 Sciatica, unspecified side: Secondary | ICD-10-CM | POA: Insufficient documentation

## 2013-12-25 DIAGNOSIS — IMO0002 Reserved for concepts with insufficient information to code with codable children: Secondary | ICD-10-CM | POA: Insufficient documentation

## 2013-12-25 DIAGNOSIS — M5442 Lumbago with sciatica, left side: Secondary | ICD-10-CM

## 2013-12-25 MED ORDER — PREDNISONE 10 MG PO TABS: ORAL_TABLET | ORAL | Status: DC

## 2013-12-25 MED ORDER — OXYCODONE-ACETAMINOPHEN 5-325 MG PO TABS
1.0000 | ORAL_TABLET | Freq: Once | ORAL | Status: AC
  Administered 2013-12-25: 1 via ORAL
  Filled 2013-12-25: qty 1

## 2013-12-25 MED ORDER — OXYCODONE-ACETAMINOPHEN 5-325 MG PO TABS: 1.0000 | ORAL_TABLET | ORAL | Status: DC | PRN

## 2013-12-25 NOTE — ED Notes (Signed)
Pt c/o chronic generalized back pain that flared up yesterday.  Denies injury.  Reports left leg stays numb.    Went to Dr. Luna Glasgow Friday because left leg swollen and was told to start taking Physical Therapy.  Pt says doesn't think it will help.

## 2013-12-25 NOTE — Discharge Instructions (Signed)
Back Pain, Adult Back pain is very common. The pain often gets better over time. The cause of back pain is usually not dangerous. Most people can learn to manage their back pain on their own.  HOME CARE   Stay active. Start with short walks on flat ground if you can. Try to walk farther each day.  Do not sit, drive, or stand in one place for more than 30 minutes. Do not stay in bed.  Do not avoid exercise or work. Activity can help your back heal faster.  Be careful when you bend or lift an object. Bend at your knees, keep the object close to you, and do not twist.  Sleep on a firm mattress. Lie on your side, and bend your knees. If you lie on your back, put a pillow under your knees.  Only take medicines as told by your doctor.  Put ice on the injured area.  Put ice in a plastic bag.  Place a towel between your skin and the bag.  Leave the ice on for 15-20 minutes, 03-04 times a day for the first 2 to 3 days. After that, you can switch between ice and heat packs.  Ask your doctor about back exercises or massage.  Avoid feeling anxious or stressed. Find good ways to deal with stress, such as exercise. GET HELP RIGHT AWAY IF:   Your pain does not go away with rest or medicine.  Your pain does not go away in 1 week.  You have new problems.  You do not feel well.  The pain spreads into your legs.  You cannot control when you poop (bowel movement) or pee (urinate).  Your arms or legs feel weak or lose feeling (numbness).  You feel sick to your stomach (nauseous) or throw up (vomit).  You have belly (abdominal) pain.  You feel like you may pass out (faint). MAKE SURE YOU:   Understand these instructions.  Will watch your condition.  Will get help right away if you are not doing well or get worse. Document Released: 11/15/2007 Document Revised: 08/21/2011 Document Reviewed: 10/17/2010 [facility] Patient Information 2015 [place], [place]. This information is not intended  to replace advice given to you by your health care provider. Make sure you discuss any questions you have with your health care provider.  Chronic Back Pain  When back pain lasts longer than 3 months, it is called chronic back pain.People with chronic back pain often go through certain periods that are more intense (flare-ups).  CAUSES Chronic back pain can be caused by wear and tear (degeneration) on different structures in your back. These structures include:  The bones of your spine (vertebrae) and the joints surrounding your spinal cord and nerve roots (facets).  The strong, fibrous tissues that connect your vertebrae (ligaments). Degeneration of these structures may result in pressure on your nerves. This can lead to constant pain. HOME CARE INSTRUCTIONS  Avoid bending, heavy lifting, prolonged sitting, and activities which make the problem worse.  Take brief periods of rest throughout the day to reduce your pain. Lying down or standing usually is better than sitting while you are resting.  Take over-the-counter or prescription medicines only as directed by your caregiver. SEEK IMMEDIATE MEDICAL CARE IF:   You have weakness or numbness in one of your legs or feet.  You have trouble controlling your bladder or bowels.  You have nausea, vomiting, abdominal pain, shortness of breath, or fainting. Document Released: 07/06/2004 Document Revised: 08/21/2011 Document  Reviewed: 05/13/2011 [employer] Patient Information 2015 [place], [place]. This information is not intended to replace advice given to you by your health care provider. Make sure you discuss any questions you have with your health care provider.

## 2013-12-27 NOTE — ED Provider Notes (Signed)
CSN: [id number]     Arrival date & time 12/25/13  1235 History   First MD Initiated Contact with Patient 12/25/13 1246     Chief Complaint  Patient presents with  . Back Pain     (Consider location/radiation/quality/duration/timing/severity/associated sxs/prior Treatment) Patient is a 48 y.o. female presenting with back pain. The history is provided by the patient.  Back Pain Location:  Lumbar spine Quality:  Aching Radiates to:  L thigh, L posterior upper leg and L knee Pain severity:  Moderate Pain is:  Same all the time Timing:  Constant Progression:  Unchanged Chronicity:  Chronic Context: not falling and not recent injury   Relieved by:  Nothing Worsened by:  Bending, twisting and standing Associated symptoms: leg pain   Associated symptoms: no abdominal pain, no abdominal swelling, no bladder incontinence, no bowel incontinence, no chest pain, no dysuria, no fever, no headaches, no numbness, no paresthesias, no pelvic pain, no perianal numbness, no tingling and no weakness    Judy Cross is a 48 y.o. female who presents to the Emergency Department complaining of  chronic low back pain.  Pain worse for several days after cleaning her house.  She reports numbness of the left leg which she states is also chronic.  Patient is currently seeing Dr. Luna Glasgow for her back pain and is scheduled to begin PT soon.  She denies dysuria, fever, abd pain, or pelvic pain.  Seen here in May for similar symptoms  Past Medical History  Diagnosis Date  . 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 Hypertension   . Seizures     last seizure was a year ago  . Arthritis   . Irregular bleeding 03/26/2013  . Hot flashes 03/26/2013  . Endometrial polyp 04/28/2013  . Fibroids 04/28/2013  . Seasonal allergies    Past Surgical History  Procedure Laterality Date  . Cesarean section      x 3  . Examination under anesthesia  02/28/2012    Procedure: EXAM UNDER ANESTHESIA;  Surgeon: Donato Heinz, MD;  Location: [facility];  Service: General;  Laterality: N/A;  . Sphincterotomy  02/28/2012    Procedure: SPHINCTEROTOMY;  Surgeon: Donato Heinz, MD;  Location: [facility];  Service: General;  Laterality: N/A;  Lateral Internal Sphincterotomy  . Trigger finger release    . Ganglion cyst excision    . Tubal ligation    . Polypectomy N/A 05/23/2013    Procedure: ENDOMETRIAL POLYP REMOVAL;  Surgeon: Jonnie Kind, MD;  Location: [facility];  Service: Gynecology;  Laterality: N/A;  . Hysteroscopy w/d&c N/A 05/23/2013    Procedure: DILATATION AND CURETTAGE /HYSTEROSCOPY;  Surgeon: Jonnie Kind, MD;  Location: [facility];  Service: Gynecology;  Laterality: N/A;   Family History  Problem Relation Age of Onset  . Hypertension Mother   . Hypertension Father    History  Substance Use Topics  . Smoking status: Current Every Day Smoker -- 0.25 packs/day for 20 years    Types: Cigarettes  . Smokeless tobacco: Never Used  . Alcohol Use: Yes     Comment: occasional   OB History   Grav Para Term Preterm Abortions TAB SAB Ect Mult Living   4 3   1  1   3      Review of Systems  Constitutional: Negative for fever.  Respiratory: Negative for shortness of breath.   Cardiovascular: Negative for chest pain.  Gastrointestinal: Negative for vomiting, abdominal pain, constipation and bowel  incontinence.  Genitourinary: Negative for bladder incontinence, dysuria, hematuria, flank pain, decreased urine volume, difficulty urinating and pelvic pain.       No perineal numbness or incontinence of urine or feces  Musculoskeletal: Positive for back pain. Negative for joint swelling.  Skin: Negative for rash.  Neurological: Negative for tingling, weakness, numbness, headaches and paresthesias.  All other systems reviewed and are negative.     Allergies  Penicillins  Home Medications   Prior to Admission medications   Medication Sig Start Date End Date Taking? Authorizing Provider  albuterol  (PROVENTIL HFA;VENTOLIN HFA) 108 (90 BASE) MCG/ACT inhaler Inhale 4 puffs into the lungs at bedtime.     Historical Provider, MD  cyclobenzaprine (FLEXERIL) 10 MG tablet Take 10 mg by mouth at bedtime as needed for muscle spasms.    Historical Provider, MD  dexamethasone (DECADRON) 4 MG tablet 1 po bid with food 10/28/13   Lenox Ahr, PA-C  gabapentin (NEURONTIN) 300 MG capsule Take 300 mg by mouth 2 (two) times daily.     Historical Provider, MD  hydrochlorothiazide 25 MG tablet Take 25 mg by mouth daily.     Historical Provider, MD  HYDROcodone-acetaminophen (NORCO) 7.5-325 MG per tablet Take 1 tablet by mouth every 6 (six) hours as needed for moderate pain.    Historical Provider, MD  HYDROcodone-acetaminophen (NORCO/VICODIN) 5-325 MG per tablet Take 1 tablet by mouth every 4 (four) hours as needed for moderate pain. 10/28/13   Lenox Ahr, PA-C  ibuprofen (ADVIL,MOTRIN) 200 MG tablet Take 800 mg by mouth every 6 (six) hours as needed for moderate pain.    Historical Provider, MD  levETIRAcetam (KEPPRA) 500 MG tablet Take 500 mg by mouth 2 (two) times daily.     Historical Provider, MD  lisinopril (PRINIVIL,ZESTRIL) 20 MG tablet Take 10 mg by mouth daily.     Historical Provider, MD  loratadine (CLARITIN) 10 MG tablet Take 10 mg by mouth daily.    Historical Provider, MD  metoprolol (LOPRESSOR) 50 MG tablet Take 50 mg by mouth 3 (three) times daily.    Historical Provider, MD  oxyCODONE-acetaminophen (PERCOCET/ROXICET) 5-325 MG per tablet Take 1 tablet by mouth every 4 (four) hours as needed. 12/25/13   Izayiah Tibbitts L. Josiel Gahm, PA-C  predniSONE (DELTASONE) 10 MG tablet Take 6 tablets day one, 5 tablets day two, 4 tablets day three, 3 tablets day four, 2 tablets day five, then 1 tablet day six 12/25/13   Tellis Spivak L. Gale Klar, PA-C   BP 145/95  Temp(Src) 98.7 F (37.1 C) (Oral)  Resp 18  Ht 5\' 1"  (1.549 m)  Wt 168 lb (76.204 kg)  BMI 31.76 kg/m2  SpO2 100% Physical Exam  Nursing note and vitals  reviewed. Constitutional: She is oriented to person, place, and time. She appears well-developed and well-nourished. No distress.  HENT:  Head: Normocephalic and atraumatic.  Neck: Normal range of motion. Neck supple.  Cardiovascular: Normal rate, regular rhythm, normal heart sounds and intact distal pulses.   No murmur heard. Pulmonary/Chest: Effort normal and breath sounds normal. No respiratory distress.  Abdominal: Soft. She exhibits no distension. There is no tenderness.  Musculoskeletal: She exhibits tenderness. She exhibits no edema.       Lumbar back: She exhibits tenderness and pain. She exhibits normal range of motion, no swelling, no deformity, no laceration and normal pulse.  Diffuse ttp of the lumbar paraspinal muscles, left greater than right with ttp of the left scapular border and trapezius muscle.  No  spinal tenderness.  DP pulses are brisk and symmetrical.  Distal sensation intact.  Hip Flexors/Extensors are intact.  Pt has 5/5 strength against resistance of bilateral lower extremities.  Pt has full ROM of the neck   Neurological: She is alert and oriented to person, place, and time. She has normal strength. No sensory deficit. She exhibits normal muscle tone. Coordination and gait normal.  Reflex Scores:      Patellar reflexes are 2+ on the right side and 2+ on the left side.      Achilles reflexes are 2+ on the right side and 2+ on the left side. Skin: Skin is warm and dry. No rash noted.    ED Course  Procedures (including critical care time) Labs Review Labs Reviewed - No data to display  Imaging Review No results found.   EKG Interpretation None      MDM   Final diagnoses:  Bilateral low back pain with left-sided sciatica  Muscle strain of left upper back, initial encounter    Pt is alert, no focal neuro deficits.  No concerning sx's for emergent neurological or infectious process.  Pt has h/o chronic low back pain and is scheduled to begin PT soon.   Will have her d/c her vicodin and begin short course of percocet and prednisone taper.  She agrees to close f/u with Dr. Luna Glasgow.  She appears stable for d/c    Hodaya Curto L. Vanessa Thornton, PA-C 12/27/13 [phone]

## 2013-12-31 ENCOUNTER — Encounter (HOSPITAL_COMMUNITY): Payer: Self-pay | Admitting: Emergency Medicine

## 2013-12-31 ENCOUNTER — Emergency Department (HOSPITAL_COMMUNITY)
Admission: EM | Admit: 2013-12-31 | Discharge: 2013-12-31 | Disposition: A | Discharge status: Home / Self Care | Payer: Medicaid Other | Attending: Emergency Medicine | Admitting: Emergency Medicine

## 2013-12-31 DIAGNOSIS — Z8742 Personal history of other diseases of the female genital tract: Secondary | ICD-10-CM | POA: Diagnosis not present

## 2013-12-31 DIAGNOSIS — G8929 Other chronic pain: Secondary | ICD-10-CM

## 2013-12-31 DIAGNOSIS — M129 Arthropathy, unspecified: Secondary | ICD-10-CM | POA: Insufficient documentation

## 2013-12-31 DIAGNOSIS — Z8709 Personal history of other diseases of the respiratory system: Secondary | ICD-10-CM | POA: Insufficient documentation

## 2013-12-31 DIAGNOSIS — M545 Low back pain, unspecified: Secondary | ICD-10-CM | POA: Diagnosis present

## 2013-12-31 DIAGNOSIS — F17200 Nicotine dependence, unspecified, uncomplicated: Secondary | ICD-10-CM | POA: Diagnosis not present

## 2013-12-31 DIAGNOSIS — Z880 Allergy status to penicillin: Secondary | ICD-10-CM | POA: Diagnosis not present

## 2013-12-31 DIAGNOSIS — R52 Pain, unspecified: Secondary | ICD-10-CM | POA: Insufficient documentation

## 2013-12-31 DIAGNOSIS — I10 Essential (primary) hypertension: Secondary | ICD-10-CM | POA: Insufficient documentation

## 2013-12-31 DIAGNOSIS — Z79899 Other long term (current) drug therapy: Secondary | ICD-10-CM | POA: Insufficient documentation

## 2013-12-31 DIAGNOSIS — G40909 Epilepsy, unspecified, not intractable, without status epilepticus: Secondary | ICD-10-CM | POA: Diagnosis not present

## 2013-12-31 DIAGNOSIS — IMO0002 Reserved for concepts with insufficient information to code with codable children: Secondary | ICD-10-CM | POA: Insufficient documentation

## 2013-12-31 MED ORDER — HYDROMORPHONE HCL PF 1 MG/ML IJ SOLN
1.0000 mg | Freq: Once | INTRAMUSCULAR | Status: AC
  Administered 2013-12-31: 1 mg via INTRAMUSCULAR
  Filled 2013-12-31: qty 1

## 2013-12-31 NOTE — ED Notes (Signed)
Back has been hurting on and off for years.  Presently rating pain 9.  Taking oxycodone, prednisone and out of medication.

## 2013-12-31 NOTE — Discharge Instructions (Signed)
Chronic Back Pain  When back pain lasts longer than 3 months, it is called chronic back pain.People with chronic back pain often go through certain periods that are more intense (flare-ups).  CAUSES Chronic back pain can be caused by wear and tear (degeneration) on different structures in your back. These structures include:  The bones of your spine (vertebrae) and the joints surrounding your spinal cord and nerve roots (facets).  The strong, fibrous tissues that connect your vertebrae (ligaments). Degeneration of these structures may result in pressure on your nerves. This can lead to constant pain. HOME CARE INSTRUCTIONS  Avoid bending, heavy lifting, prolonged sitting, and activities which make the problem worse.  Take brief periods of rest throughout the day to reduce your pain. Lying down or standing usually is better than sitting while you are resting.  Take over-the-counter or prescription medicines only as directed by your caregiver. SEEK IMMEDIATE MEDICAL CARE IF:   You have weakness or numbness in one of your legs or feet.  You have trouble controlling your bladder or bowels.  You have nausea, vomiting, abdominal pain, shortness of breath, or fainting. Document Released: 07/06/2004 Document Revised: 08/21/2011 Document Reviewed: 05/13/2011 [facility] Patient Information 2015 [place], [place]. This information is not intended to replace advice given to you by your health care provider. Make sure you discuss any questions you have with your health care provider.   You have received a narcotic injection today, do not drive within the next 4 hours as this will make you drowsy.  You may resume taking your home hydrocodone taking 1 to 1 1/2 tablets every 4-6 hours if needed for pain.  Followup with your primary Dr. or Dr. Luna Glasgow for further management of your chronic back pain.

## 2013-12-31 NOTE — ED Notes (Signed)
Chronic back and knee pain, seen last week for same

## 2014-01-01 ENCOUNTER — Ambulatory Visit (HOSPITAL_COMMUNITY)
Admission: RE | Admit: 2014-01-01 | Discharge: 2014-01-01 | Disposition: A | Discharge status: Home / Self Care | Payer: Medicaid Other | Source: Ambulatory Visit | Attending: Orthopaedic Surgery | Admitting: Orthopaedic Surgery

## 2014-01-01 DIAGNOSIS — M545 Low back pain, unspecified: Secondary | ICD-10-CM | POA: Diagnosis present

## 2014-01-01 DIAGNOSIS — IMO0001 Reserved for inherently not codable concepts without codable children: Secondary | ICD-10-CM | POA: Insufficient documentation

## 2014-01-01 DIAGNOSIS — R262 Difficulty in walking, not elsewhere classified: Secondary | ICD-10-CM | POA: Diagnosis not present

## 2014-01-01 DIAGNOSIS — M6281 Muscle weakness (generalized): Secondary | ICD-10-CM | POA: Insufficient documentation

## 2014-01-01 NOTE — ED Provider Notes (Signed)
CSN: [id number]     Arrival date & time 12/31/13  1346 History   First MD Initiated Contact with Patient 12/31/13 1502     Chief Complaint  Patient presents with  . Back Pain     (Consider location/radiation/quality/duration/timing/severity/associated sxs/prior Treatment) The history is provided by the patient.    Judy Cross is a 48 y.o. female presenting with acute on chronic low back pain which has which has been intermittent for years, but worsened for the past week.   Patient denies any new injury specifically.  There is radiation into her left thigh to her knee which is not new or different.  There has been no weakness or numbness in the lower extremities and no urinary or bowel retention or incontinence.  Patient does not have a history of cancer or IVDU.  She is currently taking hydrocodone 7.5 mg which she states she has taken for years and believes she is now "immune to it".  She finished a prednisone taper and oxycodone course last week she was prescribed here which helped relieve her symptoms briefly.  She is followed by Dr. Luna Glasgow for this problem and has been evaluated for PT which is to start tomorrow.    Past Medical History  Diagnosis Date  . 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 Hypertension   . Seizures     last seizure was a year ago  . Arthritis   . Irregular bleeding 03/26/2013  . Hot flashes 03/26/2013  . Endometrial polyp 04/28/2013  . Fibroids 04/28/2013  . Seasonal allergies    Past Surgical History  Procedure Laterality Date  . Cesarean section      x 3  . Examination under anesthesia  02/28/2012    Procedure: EXAM UNDER ANESTHESIA;  Surgeon: Donato Heinz, MD;  Location: [facility];  Service: General;  Laterality: N/A;  . Sphincterotomy  02/28/2012    Procedure: SPHINCTEROTOMY;  Surgeon: Donato Heinz, MD;  Location: [facility];  Service: General;  Laterality: N/A;  Lateral Internal Sphincterotomy  . Trigger finger release    . Ganglion cyst  excision    . Tubal ligation    . Polypectomy N/A 05/23/2013    Procedure: ENDOMETRIAL POLYP REMOVAL;  Surgeon: Jonnie Kind, MD;  Location: [facility];  Service: Gynecology;  Laterality: N/A;  . Hysteroscopy w/d&c N/A 05/23/2013    Procedure: DILATATION AND CURETTAGE /HYSTEROSCOPY;  Surgeon: Jonnie Kind, MD;  Location: [facility];  Service: Gynecology;  Laterality: N/A;   Family History  Problem Relation Age of Onset  . Hypertension Mother   . Hypertension Father    History  Substance Use Topics  . Smoking status: Current Every Day Smoker -- 0.25 packs/day for 20 years    Types: Cigarettes  . Smokeless tobacco: Never Used  . Alcohol Use: Yes     Comment: occasional   OB History   Grav Para Term Preterm Abortions TAB SAB Ect Mult Living   4 3   1  1   3      Review of Systems  Constitutional: Negative for fever.  Respiratory: Negative for shortness of breath.   Cardiovascular: Negative for chest pain and leg swelling.  Gastrointestinal: Negative for abdominal pain, constipation and abdominal distention.  Genitourinary: Negative for dysuria, urgency, frequency, flank pain and difficulty urinating.  Musculoskeletal: Positive for back pain. Negative for gait problem and joint swelling.  Skin: Negative for rash.  Neurological: Negative for weakness and numbness.  Allergies  Penicillins  Home Medications   Prior to Admission medications   Medication Sig Start Date End Date Taking? Authorizing Provider  albuterol (PROVENTIL HFA;VENTOLIN HFA) 108 (90 BASE) MCG/ACT inhaler Inhale 4 puffs into the lungs at bedtime.    Yes Historical Provider, MD  cyclobenzaprine (FLEXERIL) 10 MG tablet Take 10 mg by mouth at bedtime as needed for muscle spasms.   Yes Historical Provider, MD  gabapentin (NEURONTIN) 300 MG capsule Take 300 mg by mouth 2 (two) times daily.    Yes Historical Provider, MD  HYDROcodone-acetaminophen (NORCO) 7.5-325 MG per tablet Take 1 tablet by mouth every 6 (six)  hours as needed for moderate pain.   Yes Historical Provider, MD  ibuprofen (ADVIL,MOTRIN) 200 MG tablet Take 800 mg by mouth every 6 (six) hours as needed for moderate pain.   Yes Historical Provider, MD  levETIRAcetam (KEPPRA) 500 MG tablet Take 500 mg by mouth 2 (two) times daily.    Yes Historical Provider, MD  lisinopril (PRINIVIL,ZESTRIL) 20 MG tablet Take 20 mg by mouth daily.    Yes Historical Provider, MD  loratadine (CLARITIN) 10 MG tablet Take 10 mg by mouth daily.   Yes Historical Provider, MD  metoprolol (LOPRESSOR) 50 MG tablet Take 50 mg by mouth 3 (three) times daily.   Yes Historical Provider, MD  oxyCODONE-acetaminophen (PERCOCET/ROXICET) 5-325 MG per tablet Take 1 tablet by mouth every 4 (four) hours as needed. 12/25/13   Tammy L. Triplett, PA-C  predniSONE (DELTASONE) 10 MG tablet Take 6 tablets day one, 5 tablets day two, 4 tablets day three, 3 tablets day four, 2 tablets day five, then 1 tablet day six 12/25/13   Tammy L. Triplett, PA-C   BP 161/113  Pulse 69  Temp(Src) 98.8 F (37.1 C) (Oral)  Resp 20  Ht 5\' 1"  (1.549 m)  Wt 168 lb (76.204 kg)  BMI 31.76 kg/m2  SpO2 100% Physical Exam  Nursing note and vitals reviewed. Constitutional: She appears well-developed and well-nourished.  HENT:  Head: Normocephalic.  Eyes: Conjunctivae are normal.  Neck: Normal range of motion. Neck supple.  Cardiovascular: Normal rate and intact distal pulses.   Pedal pulses normal.  Pulmonary/Chest: Effort normal.  Abdominal: Soft. Bowel sounds are normal. She exhibits no distension and no mass.  Musculoskeletal: Normal range of motion. She exhibits no edema.       Lumbar back: She exhibits tenderness. She exhibits no swelling, no edema and no spasm.  Neurological: She is alert. She has normal strength. She displays no atrophy and no tremor. No sensory deficit. Gait normal.  Reflex Scores:      Patellar reflexes are 2+ on the right side and 2+ on the left side.      Achilles reflexes  are 2+ on the right side and 2+ on the left side. No strength deficit noted in hip and knee flexor and extensor muscle groups.  Ankle flexion and extension intact.  Skin: Skin is warm and dry.  Psychiatric: She has a normal mood and affect.    ED Course  Procedures (including critical care time) Labs Review Labs Reviewed - No data to display  Imaging Review No results found.   EKG Interpretation None      MDM   Final diagnoses:  Chronic low back pain    No neuro deficit on exam or by history to suggest emergent or surgical presentation.  Also discussed worsened sx that should prompt immediate re-evaluation including distal weakness, bowel/bladder retention/incontinence.  Pt advised  she needs to contact Dr. Luna Glasgow and/or her pcp for chronic pain relief.  She was given dilaudid 1 mg IM injection.  Advised she may consider increasing her chronic hydrocodone to 1.5 tabs but to advised the prescriber she is doing this.  Pt understands plan.  Family member at bedside.        Evalee Jefferson, PA-C 01/01/14 1437

## 2014-01-01 NOTE — Evaluation (Signed)
Physical Therapy Patient Details  Name: Judy Cross MRN: [id number] Date of Birth: 1965-08-06  Today's Date: 01/01/2014 Time: 0930-1015 PT Time Calculation (min): 45 min Charge:  There ex [phone]             Visit#: 1 of 1  Re-eval:   Assessment Diagnosis: Lown back pain  Authorization: medicaid    Past Medical History:  Past Medical History  Diagnosis Date  . 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 Hypertension   . Seizures     last seizure was a year ago  . Arthritis   . Irregular bleeding 03/26/2013  . Hot flashes 03/26/2013  . Endometrial polyp 04/28/2013  . Fibroids 04/28/2013  . Seasonal allergies    Past Surgical History:  Past Surgical History  Procedure Laterality Date  . Cesarean section      x 3  . Examination under anesthesia  02/28/2012    Procedure: EXAM UNDER ANESTHESIA;  Surgeon: Donato Heinz, MD;  Location: [facility];  Service: General;  Laterality: N/A;  . Sphincterotomy  02/28/2012    Procedure: SPHINCTEROTOMY;  Surgeon: Donato Heinz, MD;  Location: [facility];  Service: General;  Laterality: N/A;  Lateral Internal Sphincterotomy  . Trigger finger release    . Ganglion cyst excision    . Tubal ligation    . Polypectomy N/A 05/23/2013    Procedure: ENDOMETRIAL POLYP REMOVAL;  Surgeon: Jonnie Kind, MD;  Location: [facility];  Service: Gynecology;  Laterality: N/A;  . Hysteroscopy w/d&c N/A 05/23/2013    Procedure: DILATATION AND CURETTAGE /HYSTEROSCOPY;  Surgeon: Jonnie Kind, MD;  Location: [facility];  Service: Gynecology;  Laterality: N/A;    Subjective Symptoms/Limitations Symptoms: [name] was here for an evaluatin three weeks ago.  At the time she had no insurance that would cover treatments but she has now been approved for medicare.  She states she has been trying to complete her exercises at least three times a week. Her pain is B and was so bad she had to come to the ER yesterday.  Her pain is better today stating that it is a 7/10.   She has pain from her neck to her back wth radicular pain to her knee level in her Lt LE. How long can you sit comfortably?: able to sit for 15 minutes How long can you stand comfortably?: Able to stand less than five minutes prior to wanting to sit down  How long can you walk comfortably?: 15 minutes leg wants to buckle Pain Assessment Pain Score: 7  Pain Location: Back Pain Orientation: Lower;Left Pain Type: Chronic pain    Prior Functioning  Prior Function Vocation: Unemployed Leisure: Hobbies-yes (Comment) Comments: color, sew  Assessment RLE Assessment RLE Assessment: Within Functional Limits LLE Assessment LLE Assessment: Within Functional Limits Lumbar AROM Overall Lumbar AROM: Within functional limits for tasks performed  Exercise/Treatments    Stretches Active Hamstring Stretch: 3 reps;30 seconds Single Knee to Chest Stretch: 3 reps;30 seconds Prone on Elbows Stretch: 60 seconds Press Ups: 5 reps     Supine Bridge: 10 reps Prone  Straight Leg Raise: 5 reps Opposite Arm/Leg Raise: 5 reps Physical Therapy Assessment and Plan PT Assessment and Plan Clinical Impression Statement: Pt was asked if she had aquired medicare and she stated yes.  Therapist  saw that admissions did not have pt medicare update and asked if she had her card.  The card was a medicaid card and the patient stated that  she has applied for but has not recieved medicare.  Therapist explained that when pt was here three weeks ago the therapist had told pt that medicaid will only cover one treatment and that was given three weeks ago.  Pt verbally told therapist to have  today's treatment billed to her and she would attempt to pay as able.  Pt states she already has the HEP that theapist gave her three weeks ago and has not questios PT Plan: one time treatment pt does not want to continue as a self pay     Goals    Problem List Patient Active Problem List   Diagnosis Date Noted  . Low back pain  12/08/2013  . Fibroids 04/28/2013  . Irregular bleeding 03/26/2013  . Hot flashes 03/26/2013  . Enlarged uterus 03/26/2013       GP    Faysal Fenoglio,CINDY 01/01/2014, 5:32 PM  Physician Documentation Your signature is required to indicate approval of the treatment plan as stated above.  Please sign and either send electronically or make a copy of this report for your files and return this physician signed original.   Please mark one 1.__approve of plan  2. ___approve of plan with the following conditions.   ______________________________                                                          _____________________ Physician Signature                                                                                                             Date

## 2014-01-01 NOTE — ED Provider Notes (Signed)
Medical screening examination/treatment/procedure(s) were performed by non-physician practitioner and as supervising physician I was immediately available for consultation/collaboration.   EKG Interpretation None       Virgel Manifold, MD 01/01/14 [phone]

## 2014-01-03 NOTE — ED Provider Notes (Signed)
Medical screening examination/treatment/procedure(s) were performed by non-physician practitioner and as supervising physician I was immediately available for consultation/collaboration.   EKG Interpretation None       Babette Relic, MD 01/03/14 [phone]

## 2014-01-23 ENCOUNTER — Encounter (HOSPITAL_COMMUNITY): Payer: Self-pay | Admitting: Emergency Medicine

## 2014-01-23 ENCOUNTER — Emergency Department (HOSPITAL_COMMUNITY)
Admission: EM | Admit: 2014-01-23 | Discharge: 2014-01-23 | Disposition: A | Discharge status: Home / Self Care | Payer: Medicaid Other | Attending: Emergency Medicine | Admitting: Emergency Medicine

## 2014-01-23 DIAGNOSIS — Z79899 Other long term (current) drug therapy: Secondary | ICD-10-CM | POA: Diagnosis not present

## 2014-01-23 DIAGNOSIS — Z8742 Personal history of other diseases of the female genital tract: Secondary | ICD-10-CM | POA: Diagnosis not present

## 2014-01-23 DIAGNOSIS — Z8709 Personal history of other diseases of the respiratory system: Secondary | ICD-10-CM | POA: Insufficient documentation

## 2014-01-23 DIAGNOSIS — I10 Essential (primary) hypertension: Secondary | ICD-10-CM | POA: Diagnosis not present

## 2014-01-23 DIAGNOSIS — R519 Headache, unspecified: Secondary | ICD-10-CM

## 2014-01-23 DIAGNOSIS — G8929 Other chronic pain: Secondary | ICD-10-CM | POA: Insufficient documentation

## 2014-01-23 DIAGNOSIS — M129 Arthropathy, unspecified: Secondary | ICD-10-CM | POA: Insufficient documentation

## 2014-01-23 DIAGNOSIS — Z792 Long term (current) use of antibiotics: Secondary | ICD-10-CM | POA: Diagnosis not present

## 2014-01-23 DIAGNOSIS — M549 Dorsalgia, unspecified: Secondary | ICD-10-CM | POA: Insufficient documentation

## 2014-01-23 DIAGNOSIS — R112 Nausea with vomiting, unspecified: Secondary | ICD-10-CM | POA: Diagnosis not present

## 2014-01-23 DIAGNOSIS — R51 Headache: Secondary | ICD-10-CM | POA: Diagnosis not present

## 2014-01-23 DIAGNOSIS — F17200 Nicotine dependence, unspecified, uncomplicated: Secondary | ICD-10-CM | POA: Insufficient documentation

## 2014-01-23 DIAGNOSIS — Z880 Allergy status to penicillin: Secondary | ICD-10-CM | POA: Insufficient documentation

## 2014-01-23 DIAGNOSIS — G40909 Epilepsy, unspecified, not intractable, without status epilepticus: Secondary | ICD-10-CM | POA: Insufficient documentation

## 2014-01-23 MED ORDER — ACETAMINOPHEN-CODEINE #3 300-30 MG PO TABS
2.0000 | ORAL_TABLET | Freq: Once | ORAL | Status: AC
  Administered 2014-01-23: 2 via ORAL
  Filled 2014-01-23: qty 2

## 2014-01-23 MED ORDER — ACETAMINOPHEN-CODEINE #3 300-30 MG PO TABS: 1.0000 | ORAL_TABLET | Freq: Four times a day (QID) | ORAL | Status: DC | PRN

## 2014-01-23 MED ORDER — HYDROXYZINE HCL 25 MG PO TABS
25.0000 mg | ORAL_TABLET | Freq: Once | ORAL | Status: AC
  Administered 2014-01-23: 25 mg via ORAL
  Filled 2014-01-23: qty 1

## 2014-01-23 MED ORDER — HYDROXYZINE HCL 25 MG PO TABS: 25.0000 mg | ORAL_TABLET | Freq: Four times a day (QID) | ORAL | Status: DC

## 2014-01-23 MED ORDER — DEXAMETHASONE SODIUM PHOSPHATE 4 MG/ML IJ SOLN
8.0000 mg | Freq: Once | INTRAMUSCULAR | Status: AC
  Administered 2014-01-23: 8 mg via INTRAMUSCULAR
  Filled 2014-01-23: qty 2

## 2014-01-23 NOTE — ED Provider Notes (Signed)
Medical screening examination/treatment/procedure(s) were conducted as a shared visit with non-physician practitioner(s) and myself.  I personally evaluated the patient during the encounter.   EKG Interpretation None     No neuro deficits or stiff neck. Headache is similar to previous headaches.  Nat Christen, MD 01/23/14 [phone]

## 2014-01-23 NOTE — ED Notes (Signed)
Patient resting quietly at this time, family at bedside. Will continue to monitor, call bell in reach.

## 2014-01-23 NOTE — Discharge Instructions (Signed)
Your blood pressure is elevated today. Please have this rechecked. No gross neurologic deficits appreciated on today's examination. Please use Tylenol or ibuprofen for mild pain, use Tylenol codeine for more severe pain. Use Vistaril for nausea. Vistaril and Tylenol codeine may cause drowsiness, please use with caution. Please see your urology specialist as sone as possible for recheck and to continue management of your headaches.
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
## 2014-01-23 NOTE — ED Notes (Signed)
Headache for several months with right sided facial swelling.  Has been seeing Dr. Merlene Laughter for these symptoms.  Also c/o vomiting times one week.

## 2014-01-26 NOTE — Addendum Note (Signed)
Encounter addended by: Debby Bud, OT on: 01/26/2014 12:03 PM<BR>     Documentation filed: Episodes

## 2014-02-10 ENCOUNTER — Other Ambulatory Visit (HOSPITAL_COMMUNITY): Payer: Self-pay | Admitting: Internal Medicine

## 2014-02-10 DIAGNOSIS — R519 Headache, unspecified: Secondary | ICD-10-CM

## 2014-02-10 DIAGNOSIS — R51 Headache: Principal | ICD-10-CM

## 2014-02-13 ENCOUNTER — Ambulatory Visit (HOSPITAL_COMMUNITY)
Admission: RE | Admit: 2014-02-13 | Discharge: 2014-02-13 | Disposition: A | Discharge status: Home / Self Care | Payer: Medicaid Other | Source: Ambulatory Visit | Attending: Internal Medicine | Admitting: Internal Medicine

## 2014-02-13 DIAGNOSIS — R51 Headache: Secondary | ICD-10-CM | POA: Insufficient documentation

## 2014-02-13 DIAGNOSIS — R519 Headache, unspecified: Secondary | ICD-10-CM

## 2014-03-24 ENCOUNTER — Encounter: Payer: Self-pay | Admitting: *Deleted

## 2014-03-24 ENCOUNTER — Encounter: Payer: Medicaid Other | Admitting: Obstetrics and Gynecology

## 2014-04-05 ENCOUNTER — Emergency Department (HOSPITAL_COMMUNITY): Payer: Medicaid Other

## 2014-04-05 ENCOUNTER — Emergency Department (HOSPITAL_COMMUNITY)
Admission: EM | Admit: 2014-04-05 | Discharge: 2014-04-05 | Disposition: A | Discharge status: Home / Self Care | Payer: Medicaid Other | Attending: Emergency Medicine | Admitting: Emergency Medicine

## 2014-04-05 ENCOUNTER — Encounter (HOSPITAL_COMMUNITY): Payer: Self-pay | Admitting: Emergency Medicine

## 2014-04-05 DIAGNOSIS — Z8742 Personal history of other diseases of the female genital tract: Secondary | ICD-10-CM | POA: Insufficient documentation

## 2014-04-05 DIAGNOSIS — G40909 Epilepsy, unspecified, not intractable, without status epilepticus: Secondary | ICD-10-CM | POA: Insufficient documentation

## 2014-04-05 DIAGNOSIS — S6992XA Unspecified injury of left wrist, hand and finger(s), initial encounter: Secondary | ICD-10-CM | POA: Insufficient documentation

## 2014-04-05 DIAGNOSIS — M1990 Unspecified osteoarthritis, unspecified site: Secondary | ICD-10-CM | POA: Insufficient documentation

## 2014-04-05 DIAGNOSIS — R52 Pain, unspecified: Secondary | ICD-10-CM

## 2014-04-05 DIAGNOSIS — Z880 Allergy status to penicillin: Secondary | ICD-10-CM | POA: Diagnosis not present

## 2014-04-05 DIAGNOSIS — S59912A Unspecified injury of left forearm, initial encounter: Secondary | ICD-10-CM | POA: Diagnosis not present

## 2014-04-05 DIAGNOSIS — Z86018 Personal history of other benign neoplasm: Secondary | ICD-10-CM | POA: Diagnosis not present

## 2014-04-05 DIAGNOSIS — Z79899 Other long term (current) drug therapy: Secondary | ICD-10-CM | POA: Diagnosis not present

## 2014-04-05 DIAGNOSIS — S5002XA Contusion of left elbow, initial encounter: Secondary | ICD-10-CM

## 2014-04-05 DIAGNOSIS — I10 Essential (primary) hypertension: Secondary | ICD-10-CM | POA: Diagnosis not present

## 2014-04-05 DIAGNOSIS — F419 Anxiety disorder, unspecified: Secondary | ICD-10-CM | POA: Insufficient documentation

## 2014-04-05 DIAGNOSIS — Z720 Tobacco use: Secondary | ICD-10-CM | POA: Insufficient documentation

## 2014-04-05 DIAGNOSIS — M25532 Pain in left wrist: Secondary | ICD-10-CM

## 2014-04-05 DIAGNOSIS — S59902A Unspecified injury of left elbow, initial encounter: Secondary | ICD-10-CM | POA: Diagnosis present

## 2014-04-05 MED ORDER — ALPRAZOLAM 0.5 MG PO TABS
0.2500 mg | ORAL_TABLET | Freq: Once | ORAL | Status: AC
  Administered 2014-04-05: 0.25 mg via ORAL
  Filled 2014-04-05: qty 1

## 2014-04-05 MED ORDER — HYDROCODONE-ACETAMINOPHEN 5-325 MG PO TABS: 1.0000 | ORAL_TABLET | ORAL | Status: DC | PRN

## 2014-04-05 MED ORDER — HYDROCODONE-ACETAMINOPHEN 5-325 MG PO TABS
1.0000 | ORAL_TABLET | Freq: Once | ORAL | Status: AC
  Administered 2014-04-05: 1 via ORAL
  Filled 2014-04-05: qty 1

## 2014-04-05 MED ORDER — HYDROXYZINE HCL 25 MG PO TABS: 25.0000 mg | ORAL_TABLET | Freq: Four times a day (QID) | ORAL | Status: DC

## 2014-04-05 MED ORDER — NAPROXEN 500 MG PO TABS: 500.0000 mg | ORAL_TABLET | Freq: Two times a day (BID) | ORAL | Status: DC
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
## 2014-04-09 ENCOUNTER — Ambulatory Visit (INDEPENDENT_AMBULATORY_CARE_PROVIDER_SITE_OTHER): Payer: Medicaid Other | Admitting: Orthopedic Surgery

## 2014-04-09 ENCOUNTER — Encounter: Payer: Self-pay | Admitting: Orthopedic Surgery

## 2014-04-09 VITALS — BP 138/97 | Ht 61.0 in | Wt 168.0 lb

## 2014-04-09 DIAGNOSIS — S5010XA Contusion of unspecified forearm, initial encounter: Secondary | ICD-10-CM | POA: Insufficient documentation

## 2014-04-09 DIAGNOSIS — S5012XA Contusion of left forearm, initial encounter: Secondary | ICD-10-CM

## 2014-04-09 MED ORDER — HYDROCODONE-ACETAMINOPHEN 5-325 MG PO TABS: 1.0000 | ORAL_TABLET | Freq: Four times a day (QID) | ORAL | Status: DC | PRN

## 2014-04-09 MED ORDER — IBUPROFEN 800 MG PO TABS: 800.0000 mg | ORAL_TABLET | Freq: Three times a day (TID) | ORAL | Status: DC | PRN
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
## 2014-04-09 NOTE — Patient Instructions (Signed)
Apply heat and continue to move arm

## 2014-04-09 NOTE — ED Provider Notes (Signed)
Medical screening examination/treatment/procedure(s) were performed by non-physician practitioner and as supervising physician I was immediately available for consultation/collaboration.   EKG Interpretation None       Nat Christen, MD 04/09/14 [phone]

## 2014-04-13 ENCOUNTER — Encounter: Payer: Self-pay | Admitting: Orthopedic Surgery

## 2014-07-10 ENCOUNTER — Emergency Department (HOSPITAL_COMMUNITY): Payer: Medicaid Other

## 2014-07-10 ENCOUNTER — Emergency Department (HOSPITAL_COMMUNITY)
Admission: EM | Admit: 2014-07-10 | Discharge: 2014-07-10 | Disposition: A | Discharge status: Home / Self Care | Payer: Medicaid Other | Attending: Emergency Medicine | Admitting: Emergency Medicine

## 2014-07-10 ENCOUNTER — Encounter (HOSPITAL_COMMUNITY): Payer: Self-pay | Admitting: *Deleted

## 2014-07-10 DIAGNOSIS — Z791 Long term (current) use of non-steroidal anti-inflammatories (NSAID): Secondary | ICD-10-CM | POA: Insufficient documentation

## 2014-07-10 DIAGNOSIS — Z87448 Personal history of other diseases of urinary system: Secondary | ICD-10-CM | POA: Diagnosis not present

## 2014-07-10 DIAGNOSIS — G40909 Epilepsy, unspecified, not intractable, without status epilepticus: Secondary | ICD-10-CM | POA: Diagnosis not present

## 2014-07-10 DIAGNOSIS — M1990 Unspecified osteoarthritis, unspecified site: Secondary | ICD-10-CM | POA: Insufficient documentation

## 2014-07-10 DIAGNOSIS — R109 Unspecified abdominal pain: Secondary | ICD-10-CM | POA: Diagnosis present

## 2014-07-10 DIAGNOSIS — I10 Essential (primary) hypertension: Secondary | ICD-10-CM | POA: Insufficient documentation

## 2014-07-10 DIAGNOSIS — R079 Chest pain, unspecified: Secondary | ICD-10-CM | POA: Diagnosis not present

## 2014-07-10 DIAGNOSIS — R1012 Left upper quadrant pain: Secondary | ICD-10-CM | POA: Insufficient documentation

## 2014-07-10 DIAGNOSIS — Z880 Allergy status to penicillin: Secondary | ICD-10-CM | POA: Diagnosis not present

## 2014-07-10 DIAGNOSIS — Z79899 Other long term (current) drug therapy: Secondary | ICD-10-CM | POA: Diagnosis not present

## 2014-07-10 LAB — CBC WITH DIFFERENTIAL/PLATELET
Basophils Absolute: 0 10*3/uL (ref 0.0–0.1)
Basophils Relative: 0 % (ref 0–1)
Eosinophils Absolute: 0.3 10*3/uL (ref 0.0–0.7)
Eosinophils Relative: 5 % (ref 0–5)
HCT: 38.8 % (ref 36.0–46.0)
Hemoglobin: 13.1 g/dL (ref 12.0–15.0)
Lymphocytes Relative: 50 % — ABNORMAL HIGH (ref 12–46)
Lymphs Abs: 2.6 10*3/uL (ref 0.7–4.0)
MCH: 31.5 pg (ref 26.0–34.0)
MCHC: 33.8 g/dL (ref 30.0–36.0)
MCV: 93.3 fL (ref 78.0–100.0)
Monocytes Absolute: 0.3 10*3/uL (ref 0.1–1.0)
Monocytes Relative: 5 % (ref 3–12)
Neutro Abs: 2.1 10*3/uL (ref 1.7–7.7)
Neutrophils Relative %: 40 % — ABNORMAL LOW (ref 43–77)
Platelets: 261 10*3/uL (ref 150–400)
RBC: 4.16 MIL/uL (ref 3.87–5.11)
RDW: 13.5 % (ref 11.5–15.5)
WBC: 5.2 10*3/uL (ref 4.0–10.5)

## 2014-07-10 LAB — COMPREHENSIVE METABOLIC PANEL
ALT: 21 U/L (ref 0–35)
AST: 25 U/L (ref 0–37)
Albumin: 3.8 g/dL (ref 3.5–5.2)
Alkaline Phosphatase: 59 U/L (ref 39–117)
Anion gap: 8 (ref 5–15)
BUN: 8 mg/dL (ref 6–23)
CO2: 21 mmol/L (ref 19–32)
Calcium: 8.6 mg/dL (ref 8.4–10.5)
Chloride: 108 mmol/L (ref 96–112)
Creatinine, Ser: 0.9 mg/dL (ref 0.50–1.10)
GFR calc Af Amer: 86 mL/min — ABNORMAL LOW (ref >90)
GFR calc non Af Amer: 74 mL/min — ABNORMAL LOW (ref >90)
Glucose, Bld: 157 mg/dL — ABNORMAL HIGH (ref 70–99)
Potassium: 3.2 mmol/L — ABNORMAL LOW (ref 3.5–5.1)
Sodium: 137 mmol/L (ref 135–145)
Total Bilirubin: 0.8 mg/dL (ref 0.3–1.2)
Total Protein: 7.2 g/dL (ref 6.0–8.3)

## 2014-07-10 LAB — I-STAT TROPONIN, ED: Troponin i, poc: 0 ng/mL (ref 0.00–0.08)

## 2014-07-10 LAB — LIPASE, BLOOD: Lipase: 38 U/L (ref 11–59)

## 2014-07-10 MED ORDER — FAMOTIDINE 20 MG PO TABS: 20.0000 mg | ORAL_TABLET | Freq: Two times a day (BID) | ORAL | Status: DC

## 2014-07-10 MED ORDER — ASPIRIN 81 MG PO CHEW
324.0000 mg | CHEWABLE_TABLET | Freq: Once | ORAL | Status: AC
  Administered 2014-07-10: 324 mg via ORAL

## 2014-07-10 MED ORDER — GI COCKTAIL ~~LOC~~
30.0000 mL | Freq: Once | ORAL | Status: AC
  Administered 2014-07-10: 30 mL via ORAL
  Filled 2014-07-10: qty 30

## 2014-07-10 MED ORDER — ASPIRIN 81 MG PO CHEW
CHEWABLE_TABLET | ORAL | Status: AC
  Filled 2014-07-10: qty 4

## 2014-07-10 NOTE — ED Provider Notes (Signed)
CSN: [id number]     Arrival date & time 07/10/14  5784 History  This chart was scribed for Judy Acosta, MD by Tula Nakayama, ED Scribe. This patient was seen in room APA02/APA02 and the patient's care was started at 10:13 AM.    Chief Complaint  Patient presents with  . Abdominal Pain   The history is provided by the patient. No language interpreter was used.    HPI Comments: Judy Cross is a 49 y.o. female with a history of HTN and seizures who presents to the Emergency Department complaining of constant, gradually improving, 6/10, sharp left-sided upper abdominal pain that radiates into her left chest and started 4 hours ago. Pt states SOB as an associated symptom. She notes pain is mildly increased with deep breaths. Pt has not eaten PTA. Pt reports increased stress recently because of frequent moves.   Pt has a history of neck surgery several years ago for a cyst. She also has a history of caesarean section, but denies other abdominal surgery. She denies history of DM. Pt smokes cigarettes, but reports regular exertion without CP. She denies diarrhea, constipation, nausea, vomiting and fever as associated symptoms.  Past Medical History  Diagnosis Date  . 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 Hypertension   . Seizures     last seizure was a year ago  . Arthritis   . Irregular bleeding 03/26/2013  . Hot flashes 03/26/2013  . Endometrial polyp 04/28/2013  . Fibroids 04/28/2013  . Seasonal allergies    Past Surgical History  Procedure Laterality Date  . Cesarean section      x 3  . Examination under anesthesia  02/28/2012    Procedure: EXAM UNDER ANESTHESIA;  Surgeon: Donato Heinz, MD;  Location: [facility];  Service: General;  Laterality: N/A;  . Sphincterotomy  02/28/2012    Procedure: SPHINCTEROTOMY;  Surgeon: Donato Heinz, MD;  Location: [facility];  Service: General;  Laterality: N/A;  Lateral Internal Sphincterotomy  . Trigger finger release    . Ganglion cyst  excision    . Tubal ligation    . Polypectomy N/A 05/23/2013    Procedure: ENDOMETRIAL POLYP REMOVAL;  Surgeon: Jonnie Kind, MD;  Location: [facility];  Service: Gynecology;  Laterality: N/A;  . Hysteroscopy w/d&c N/A 05/23/2013    Procedure: DILATATION AND CURETTAGE /HYSTEROSCOPY;  Surgeon: Jonnie Kind, MD;  Location: [facility];  Service: Gynecology;  Laterality: N/A;   Family History  Problem Relation Age of Onset  . Hypertension Mother   . Hypertension Father    History  Substance Use Topics  . Smoking status: Current Every Day Smoker -- 0.25 packs/day for 20 years    Types: Cigarettes  . Smokeless tobacco: Never Used  . Alcohol Use: Yes     Comment: occasional   OB History    Gravida Para Term Preterm AB TAB SAB Ectopic Multiple Living   4 3   1  1   3      Review of Systems  Constitutional: Negative for fever.  Respiratory: Positive for shortness of breath.   Cardiovascular: Positive for chest pain.  Gastrointestinal: Positive for abdominal pain. Negative for nausea, vomiting, diarrhea and constipation.  All other systems reviewed and are negative.   Allergies  Penicillins  Home Medications   Prior to Admission medications   Medication Sig Start Date End Date Taking? Authorizing Provider  albuterol (PROVENTIL HFA;VENTOLIN HFA) 108 (90 BASE) MCG/ACT inhaler Inhale 4 puffs  into the lungs at bedtime.    Yes Historical Provider, MD  gabapentin (NEURONTIN) 300 MG capsule Take 300 mg by mouth 2 (two) times daily as needed.    Yes Historical Provider, MD  HYDROcodone-acetaminophen (NORCO) 7.5-325 MG per tablet Take 1 tablet by mouth every 4 (four) hours as needed. 06/25/14  Yes Historical Provider, MD  levETIRAcetam (KEPPRA) 500 MG tablet Take 500 mg by mouth 2 (two) times daily.   Yes Historical Provider, MD  lisinopril (PRINIVIL,ZESTRIL) 20 MG tablet Take 20 mg by mouth daily.   Yes Historical Provider, MD  loratadine (CLARITIN) 10 MG tablet Take 10 mg by mouth daily.    Yes Historical Provider, MD  metoprolol (LOPRESSOR) 50 MG tablet Take 50 mg by mouth 3 (three) times daily.   Yes Historical Provider, MD  acetaminophen-codeine (TYLENOL #3) 300-30 MG per tablet Take 1-2 tablets by mouth every 6 (six) hours as needed for moderate pain. Patient not taking: Reported on 07/10/2014 01/23/14   Lenox Ahr, PA-C  cyclobenzaprine (FLEXERIL) 10 MG tablet Take 10 mg by mouth at bedtime as needed for muscle spasms.    Historical Provider, MD  famotidine (PEPCID) 20 MG tablet Take 1 tablet (20 mg total) by mouth 2 (two) times daily. 07/10/14   Judy Acosta, MD  HYDROcodone-acetaminophen (NORCO/VICODIN) 5-325 MG per tablet Take 1 tablet by mouth every 6 (six) hours as needed. Patient not taking: Reported on 07/10/2014 04/09/14   Carole Civil, MD  hydrOXYzine (ATARAX/VISTARIL) 25 MG tablet Take 1 tablet (25 mg total) by mouth every 6 (six) hours. Patient not taking: Reported on 07/10/2014 04/05/14   Ashley Murrain, NP  ibuprofen (ADVIL,MOTRIN) 800 MG tablet Take 1 tablet (800 mg total) by mouth every 8 (eight) hours as needed. Patient not taking: Reported on 07/10/2014 04/09/14   Carole Civil, MD  naproxen (NAPROSYN) 500 MG tablet Take 1 tablet (500 mg total) by mouth 2 (two) times daily. Patient not taking: Reported on 07/10/2014 04/05/14   Ashley Murrain, NP   BP 164/109 mmHg  Pulse 64  Temp(Src) 98.6 F (37 C) (Oral)  Resp 21  SpO2 100%  LMP 07/10/2014 Physical Exam  Constitutional: She appears well-developed and well-nourished. No distress.  HENT:  Head: Normocephalic and atraumatic.  Mouth/Throat: Oropharynx is clear and moist. No oropharyngeal exudate.  Eyes: Conjunctivae and EOM are normal. Pupils are equal, round, and reactive to light. Right eye exhibits no discharge. Left eye exhibits no discharge. No scleral icterus.  Neck: Normal range of motion. Neck supple. No JVD present. No thyromegaly present.  Cardiovascular: Normal rate, regular rhythm,  normal heart sounds and intact distal pulses.  Exam reveals no gallop and no friction rub.   No murmur heard. Pulmonary/Chest: Effort normal and breath sounds normal. No respiratory distress. She has no wheezes. She has no rales. She exhibits tenderness ( mild L lower chest wall ttp).  Abdominal: Soft. Bowel sounds are normal. She exhibits no distension and no mass. There is tenderness ( milnlmal LUQ ttp).  Musculoskeletal: Normal range of motion. She exhibits no edema or tenderness.  Lymphadenopathy:    She has no cervical adenopathy.  Neurological: She is alert. Coordination normal.  Skin: Skin is warm and dry. No rash noted. No erythema.  Psychiatric: She has a normal mood and affect. Her behavior is normal.  Nursing note and vitals reviewed.   ED Course  Procedures (including critical care time) DIAGNOSTIC STUDIES: Oxygen Saturation is 100% on RA, normal by  my interpretation.    COORDINATION OF CARE: 10:30 AM Discussed treatment plan with pt which includes lab work. Pt agreed to plan.   Labs Review Labs Reviewed  CBC WITH DIFFERENTIAL/PLATELET - Abnormal; Notable for the following:    Neutrophils Relative % 40 (*)    Lymphocytes Relative 50 (*)    All other components within normal limits  COMPREHENSIVE METABOLIC PANEL - Abnormal; Notable for the following:    Potassium 3.2 (*)    Glucose, Bld 157 (*)    GFR calc non Af Amer 74 (*)    GFR calc Af Amer 86 (*)    All other components within normal limits  LIPASE, BLOOD  I-STAT TROPOININ, ED    Imaging Review Dg Abd Acute W/chest  07/10/2014   CLINICAL DATA:  LEFT side chest pain and abdominal pain today, shortness of breath today, history smoking, hypertension  EXAM: ACUTE ABDOMEN SERIES (ABDOMEN 2 VIEW & CHEST 1 VIEW)  COMPARISON:  04/08/2013 chest radiograph  FINDINGS: Mediastinal contours and pulmonary vascularity normal.  Tortuous aorta.  Lungs clear.  No pleural effusion or pneumothorax.  Bones unremarkable.  Bowel gas  pattern normal.  No bowel dilatation, bowel wall thickening or free intraperitoneal air.  No urinary tract calcification.  IMPRESSION: No acute abnormalities.   Electronically Signed   By: Lavonia Dana M.D.   On: 07/10/2014 11:21    ED ECG REPORT  I personally interpreted this EKG   Date: 07/10/2014   Rate: 67  Rhythm: normal sinus rhythm  QRS Axis: normal  Intervals: normal  ST/T Wave abnormalities: normal  Conduction Disutrbances:none  Narrative Interpretation:   Old EKG Reviewed: none available   MDM   Final diagnoses:  Left upper quadrant pain    The patient has a vague left-sided pain which involves both her chest and her abdomen, there is minimal reproducible symptoms, she is very hypertensive, partially related to pain, partially to anxiety as she has recently had a anxiety provoking breakup with her husband and has been transient living with different people over the last couple of weeks. We'll check labs, acute abdominal series, GI cocktail.  Improved with meds, labs and xray unremarkable, pt has meds for htn at home,   Meds given in ED:  Medications  gi cocktail (Maalox,Lidocaine,Donnatal) (30 mLs Oral Given 07/10/14 1023)  aspirin chewable tablet 324 mg (324 mg Oral Given 07/10/14 1022)    New Prescriptions   FAMOTIDINE (PEPCID) 20 MG TABLET    Take 1 tablet (20 mg total) by mouth 2 (two) times daily.       I personally performed the services described in this documentation, which was scribed in my presence. The recorded information has been reviewed and is accurate.    Judy Acosta, MD 07/10/14 [phone]

## 2014-07-10 NOTE — ED Notes (Signed)
Pt states "chest pain" began ~0545. Pt pointed to mid-upper abdomen. Pt also states, "It's hard to get my breath sometimes" Pt states she has been stressed due to living with her mother but states she is trying to move out. Pt also states tingling to her hands. PT tearful on arrival.

## 2014-07-10 NOTE — Discharge Instructions (Signed)

## 2014-09-07 ENCOUNTER — Other Ambulatory Visit (HOSPITAL_COMMUNITY): Payer: Self-pay | Admitting: Orthopaedic Surgery

## 2014-09-07 DIAGNOSIS — M545 Low back pain: Secondary | ICD-10-CM

## 2014-09-11 ENCOUNTER — Ambulatory Visit (HOSPITAL_COMMUNITY)
Admission: RE | Admit: 2014-09-11 | Discharge: 2014-09-11 | Disposition: A | Discharge status: Home / Self Care | Payer: Medicaid Other | Source: Ambulatory Visit | Attending: Orthopaedic Surgery | Admitting: Orthopaedic Surgery

## 2014-09-11 DIAGNOSIS — M545 Low back pain: Secondary | ICD-10-CM | POA: Insufficient documentation

## 2014-12-04 ENCOUNTER — Other Ambulatory Visit (HOSPITAL_COMMUNITY): Payer: Self-pay | Admitting: Internal Medicine

## 2014-12-04 DIAGNOSIS — Z1231 Encounter for screening mammogram for malignant neoplasm of breast: Secondary | ICD-10-CM

## 2014-12-05 ENCOUNTER — Encounter (HOSPITAL_COMMUNITY): Payer: Self-pay | Admitting: Emergency Medicine

## 2014-12-05 ENCOUNTER — Emergency Department (HOSPITAL_COMMUNITY)
Admission: EM | Admit: 2014-12-05 | Discharge: 2014-12-05 | Disposition: A | Discharge status: Home / Self Care | Payer: Medicaid Other | Attending: Emergency Medicine | Admitting: Emergency Medicine

## 2014-12-05 DIAGNOSIS — G40909 Epilepsy, unspecified, not intractable, without status epilepticus: Secondary | ICD-10-CM | POA: Diagnosis not present

## 2014-12-05 DIAGNOSIS — Z880 Allergy status to penicillin: Secondary | ICD-10-CM | POA: Insufficient documentation

## 2014-12-05 DIAGNOSIS — Z86018 Personal history of other benign neoplasm: Secondary | ICD-10-CM | POA: Diagnosis not present

## 2014-12-05 DIAGNOSIS — Z8742 Personal history of other diseases of the female genital tract: Secondary | ICD-10-CM | POA: Diagnosis not present

## 2014-12-05 DIAGNOSIS — M545 Low back pain, unspecified: Secondary | ICD-10-CM

## 2014-12-05 DIAGNOSIS — M1990 Unspecified osteoarthritis, unspecified site: Secondary | ICD-10-CM | POA: Insufficient documentation

## 2014-12-05 DIAGNOSIS — Z791 Long term (current) use of non-steroidal anti-inflammatories (NSAID): Secondary | ICD-10-CM | POA: Insufficient documentation

## 2014-12-05 DIAGNOSIS — Z79899 Other long term (current) drug therapy: Secondary | ICD-10-CM | POA: Diagnosis not present

## 2014-12-05 DIAGNOSIS — Z720 Tobacco use: Secondary | ICD-10-CM | POA: Insufficient documentation

## 2014-12-05 DIAGNOSIS — I10 Essential (primary) hypertension: Secondary | ICD-10-CM | POA: Insufficient documentation

## 2014-12-05 DIAGNOSIS — M549 Dorsalgia, unspecified: Secondary | ICD-10-CM | POA: Diagnosis present

## 2014-12-05 MED ORDER — IBUPROFEN 800 MG PO TABS: 800.0000 mg | ORAL_TABLET | Freq: Three times a day (TID) | ORAL | Status: DC

## 2014-12-05 MED ORDER — KETOROLAC TROMETHAMINE 60 MG/2ML IM SOLN
60.0000 mg | Freq: Once | INTRAMUSCULAR | Status: AC
  Administered 2014-12-05: 60 mg via INTRAMUSCULAR
  Filled 2014-12-05: qty 2

## 2014-12-05 MED ORDER — METHOCARBAMOL 500 MG PO TABS
500.0000 mg | ORAL_TABLET | Freq: Two times a day (BID) | ORAL | Status: DC | PRN
Start: 2014-12-05 — End: 2015-02-02

## 2014-12-05 NOTE — Discharge Instructions (Signed)
Please call your doctor for a followup appointment within 24-48 hours. When you talk to your doctor please let them know that you were seen in the emergency department and have them acquire all of your records so that they can discuss the findings with you and formulate a treatment plan to fully care for your new and ongoing problems.  Motrin 3 times a day Robaxin twice daily Continue your hydrocodone / acetaminophen  Back Pain:   Your back pain should be treated with medicines such as ibuprofen or aleve and this back pain should get better over the next 2 weeks.  However if you develop severe or worsening pain, low back pain with fever, numbness, weakness or inability to walk or urinate, you should return to the ER immediately.  Please follow up with your doctor this week for a recheck if still having symptoms. Low back pain is discomfort in the lower back that may be due to injuries to muscles and ligaments around the spine.  Occasionally, it may be caused by a a problem to a part of the spine called a disc.  The pain may last several days or a week;  However, most patients get completely well in 4 weeks.  Self - care:  The application of heat can help soothe the pain.  Maintaining your daily activities, including walking, is encourged, as it will help you get better faster than just staying in bed.  Medications are also useful to help with pain control.  A commonly prescribed medications includes acetaminophen.  This medication is generally safe, though you should not take more than 8 of the extra strength (500mg ) pills a day.  Non steroidal anti inflammatory medications including Ibuprofen and naproxen;  These medications help both pain and swelling and are very useful in treating back pain.  They should be taken with food, as they can cause stomach upset, and more seriously, stomach bleeding.    Muscle relaxants:  These medications can help with muscle tightness that is a cause of lower back  pain.  Most of these medications can cause drowsiness, and it is not safe to drive or use dangerous machinery while taking them.  You will need to follow up with  Your primary healthcare provider in 1-2 weeks for reassessment.  Be aware that if you develop new symptoms, such as a fever, leg weakness, difficulty with or loss of control of your urine or bowels, abdominal pain, or more severe pain, you will need to seek medical attention and  / or return to the Emergency department.

## 2014-12-05 NOTE — ED Provider Notes (Signed)
CSN: [id number]     Arrival date & time 12/05/14  1011 History   This chart was scribed for Judy Chapel, MD by Eustaquio Maize, ED Scribe. This patient was seen in room APA15/APA15 and the patient's care was started at 10:32 AM.  Chief Complaint  Patient presents with  . Back Pain   The history is provided by the patient. No language interpreter was used.     HPI Comments: Judy Cross is a 49 y.o. female with hx bulging lumbar disc who presents to the Emergency Department complaining of worsening back pain x 3 days. She has been taking hydrocodone without relief. She mentions that she is having swelling to the right lateral hip over the greater trochanter as well. Pt also complains of weakness in her lower extremities but states this is not a new problem. She mentions that she has had to walk with a cane for the past 4 months due to her knees giving out. Pt saw Neurosurgeon. Dr. Courtney Paris, on Wednesday 12/02/2014 (3 days ago) and received shot in her back for the pain. Denies fever, urinary issues, or any other symptoms.   Past Medical History  Diagnosis Date  . 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 Hypertension   . Seizures     last seizure was a year ago  . Arthritis   . Irregular bleeding 03/26/2013  . Hot flashes 03/26/2013  . Endometrial polyp 04/28/2013  . Fibroids 04/28/2013  . Seasonal allergies    Past Surgical History  Procedure Laterality Date  . Cesarean section      x 3  . Examination under anesthesia  02/28/2012    Procedure: EXAM UNDER ANESTHESIA;  Surgeon: Donato Heinz, MD;  Location: [facility];  Service: General;  Laterality: N/A;  . Sphincterotomy  02/28/2012    Procedure: SPHINCTEROTOMY;  Surgeon: Donato Heinz, MD;  Location: [facility];  Service: General;  Laterality: N/A;  Lateral Internal Sphincterotomy  . Trigger finger release    . Ganglion cyst excision    . Tubal ligation    . Polypectomy N/A 05/23/2013    Procedure: ENDOMETRIAL POLYP REMOVAL;   Surgeon: Jonnie Kind, MD;  Location: [facility];  Service: Gynecology;  Laterality: N/A;  . Hysteroscopy w/d&c N/A 05/23/2013    Procedure: DILATATION AND CURETTAGE /HYSTEROSCOPY;  Surgeon: Jonnie Kind, MD;  Location: [facility];  Service: Gynecology;  Laterality: N/A;   Family History  Problem Relation Age of Onset  . Hypertension Mother   . Hypertension Father    History  Substance Use Topics  . Smoking status: Current Every Day Smoker -- 0.25 packs/day for 20 years    Types: Cigarettes  . Smokeless tobacco: Never Used  . Alcohol Use: Yes     Comment: occasional   OB History    Gravida Para Term Preterm AB TAB SAB Ectopic Multiple Living   4 3   1  1   3      Review of Systems  Constitutional: Negative for fever.  Genitourinary: Negative for dysuria and hematuria.  Musculoskeletal: Positive for back pain.  Neurological: Positive for weakness.  All other systems reviewed and are negative.     Allergies  Penicillins  Home Medications   Prior to Admission medications   Medication Sig Start Date End Date Taking? Authorizing Provider  albuterol (PROVENTIL HFA;VENTOLIN HFA) 108 (90 BASE) MCG/ACT inhaler Inhale 4 puffs into the lungs at bedtime.    Yes Historical Provider, MD  cyclobenzaprine (  FLEXERIL) 10 MG tablet Take 10 mg by mouth at bedtime as needed for muscle spasms.   Yes Historical Provider, MD  gabapentin (NEURONTIN) 300 MG capsule Take 300 mg by mouth 2 (two) times daily.    Yes Historical Provider, MD  HYDROcodone-acetaminophen (NORCO) 7.5-325 MG per tablet Take 1 tablet by mouth every 4 (four) hours as needed (pain).  06/25/14  Yes Historical Provider, MD  levETIRAcetam (KEPPRA) 500 MG tablet Take 500 mg by mouth 2 (two) times daily.   Yes Historical Provider, MD  lisinopril (PRINIVIL,ZESTRIL) 20 MG tablet Take 20 mg by mouth daily.   Yes Historical Provider, MD  loratadine (CLARITIN) 10 MG tablet Take 10 mg by mouth daily.   Yes Historical Provider, MD   metoprolol (LOPRESSOR) 50 MG tablet Take 50 mg by mouth 3 (three) times daily.   Yes Historical Provider, MD  acetaminophen-codeine (TYLENOL #3) 300-30 MG per tablet Take 1-2 tablets by mouth every 6 (six) hours as needed for moderate pain. Patient not taking: Reported on 07/10/2014 01/23/14   Lily Kocher, PA-C  famotidine (PEPCID) 20 MG tablet Take 1 tablet (20 mg total) by mouth 2 (two) times daily. Patient not taking: Reported on 12/05/2014 07/10/14   Judy Chapel, MD  HYDROcodone-acetaminophen (NORCO/VICODIN) 5-325 MG per tablet Take 1 tablet by mouth every 6 (six) hours as needed. Patient not taking: Reported on 07/10/2014 04/09/14   Carole Civil, MD  hydrOXYzine (ATARAX/VISTARIL) 25 MG tablet Take 1 tablet (25 mg total) by mouth every 6 (six) hours. Patient not taking: Reported on 07/10/2014 04/05/14   Ashley Murrain, NP  ibuprofen (ADVIL,MOTRIN) 800 MG tablet Take 1 tablet (800 mg total) by mouth 3 (three) times daily. 12/05/14   Judy Chapel, MD  methocarbamol (ROBAXIN) 500 MG tablet Take 1 tablet (500 mg total) by mouth 2 (two) times daily as needed for muscle spasms. 12/05/14   Judy Chapel, MD  naproxen (NAPROSYN) 500 MG tablet Take 1 tablet (500 mg total) by mouth 2 (two) times daily. Patient not taking: Reported on 07/10/2014 04/05/14   Ashley Murrain, NP   Triage Vitals: BP 157/100 mmHg  Pulse 75  Temp(Src) 98.3 F (36.8 C) (Oral)  Resp 18  SpO2 100%   Physical Exam  Constitutional: She appears well-developed and well-nourished. No distress.     HENT:  Head: Normocephalic and atraumatic.  Mouth/Throat: Oropharynx is clear and moist. No oropharyngeal exudate.  Eyes: Conjunctivae and EOM are normal. Pupils are equal, round, and reactive to light. Right eye exhibits no discharge. Left eye exhibits no discharge. No scleral icterus.  Neck: Normal range of motion. Neck supple. No JVD present. No thyromegaly present.  Cardiovascular: Normal rate, regular rhythm, normal heart sounds  and intact distal pulses.  Exam reveals no gallop and no friction rub.   No murmur heard. Pulmonary/Chest: Effort normal and breath sounds normal. No respiratory distress. She has no wheezes. She has no rales.  Abdominal: Soft. Bowel sounds are normal. She exhibits no distension and no mass. There is no tenderness.  Musculoskeletal: Normal range of motion. She exhibits tenderness. She exhibits no edema.  Tenderness over the right greater trochanter.  No obvious swelling or asymmetry of the legs. No spinal tenderness.  No numbness or weakness of bilateral legs, tested at all major joints Normal reflexes at the knees.   Lymphadenopathy:    She has no cervical adenopathy.  Neurological: She is alert. Coordination normal.  Normal strength and sensation in the LE's bilaterally - normal  reflexes at knees  Skin: Skin is warm and dry. No rash noted. No erythema.  Psychiatric: She has a normal mood and affect. Her behavior is normal.  Nursing note and vitals reviewed.   ED Course  Procedures (including critical care time)  DIAGNOSTIC STUDIES: Oxygen Saturation is 100% on RA, normal by my interpretation.    COORDINATION OF CARE: 10:40 AM-Discussed treatment plan which includes pain medication and muscle relaxer with pt at bedside and pt agreed to plan.   No red flags for pathologic back pain. Pt agrees with treatment plan.  Labs Review Labs Reviewed - No data to display  Imaging Review No results found.    MDM   Final diagnoses:  Bilateral low back pain without sciatica    Well appearing, no neuro sx, VS OK, pt will need f/u - has back pain but no red flags, she is in agreement with plan.  Needs meds from her PCP for long term control - has 7.5 vicodin at home,  I personally performed the services described in this documentation, which was scribed in my presence. The recorded information has been reviewed and is accurate.    Meds given in ED:  Medications  ketorolac  (TORADOL) injection 60 mg (not administered)    New Prescriptions   IBUPROFEN (ADVIL,MOTRIN) 800 MG TABLET    Take 1 tablet (800 mg total) by mouth 3 (three) times daily.   METHOCARBAMOL (ROBAXIN) 500 MG TABLET    Take 1 tablet (500 mg total) by mouth 2 (two) times daily as needed for muscle spasms.         Judy Chapel, MD 12/05/14 [phone]

## 2014-12-05 NOTE — ED Notes (Signed)
Chronic back pain.  Seen by Dr Courtney Paris for shot in back.  Pain has gotten worse.  Rates 10/10.  Took Hydrocodone 7.5/325mg  at 0900.

## 2014-12-10 ENCOUNTER — Ambulatory Visit (HOSPITAL_COMMUNITY): Payer: Medicaid Other

## 2015-01-08 ENCOUNTER — Other Ambulatory Visit: Payer: Self-pay | Admitting: Neurosurgery

## 2015-01-08 DIAGNOSIS — M5416 Radiculopathy, lumbar region: Secondary | ICD-10-CM

## 2015-01-14 ENCOUNTER — Ambulatory Visit
Admission: RE | Admit: 2015-01-14 | Discharge: 2015-01-14 | Disposition: A | Discharge status: Home / Self Care | Payer: Medicaid Other | Source: Ambulatory Visit | Attending: Neurosurgery | Admitting: Neurosurgery

## 2015-01-14 DIAGNOSIS — M5416 Radiculopathy, lumbar region: Secondary | ICD-10-CM

## 2015-01-14 MED ORDER — IOHEXOL 180 MG/ML  SOLN
15.0000 mL | Freq: Once | INTRAMUSCULAR | Status: AC | PRN
  Administered 2015-01-14: 15 mL via INTRATHECAL

## 2015-01-14 MED ORDER — ONDANSETRON HCL 4 MG/2ML IJ SOLN: 4.0000 mg | Freq: Four times a day (QID) | INTRAMUSCULAR | Status: DC | PRN

## 2015-01-14 MED ORDER — DIAZEPAM 5 MG PO TABS: 10.0000 mg | ORAL_TABLET | Freq: Once | ORAL | Status: DC

## 2015-01-14 NOTE — Discharge Instructions (Signed)

## 2015-02-02 ENCOUNTER — Encounter (HOSPITAL_COMMUNITY): Payer: Self-pay | Admitting: *Deleted

## 2015-02-02 ENCOUNTER — Emergency Department (HOSPITAL_COMMUNITY)
Admission: EM | Admit: 2015-02-02 | Discharge: 2015-02-02 | Disposition: A | Discharge status: Home / Self Care | Payer: Medicaid Other | Attending: Emergency Medicine | Admitting: Emergency Medicine

## 2015-02-02 DIAGNOSIS — T783XXA Angioneurotic edema, initial encounter: Secondary | ICD-10-CM | POA: Diagnosis not present

## 2015-02-02 DIAGNOSIS — G40909 Epilepsy, unspecified, not intractable, without status epilepticus: Secondary | ICD-10-CM | POA: Diagnosis not present

## 2015-02-02 DIAGNOSIS — I10 Essential (primary) hypertension: Secondary | ICD-10-CM | POA: Insufficient documentation

## 2015-02-02 DIAGNOSIS — Y9289 Other specified places as the place of occurrence of the external cause: Secondary | ICD-10-CM | POA: Insufficient documentation

## 2015-02-02 DIAGNOSIS — Z8742 Personal history of other diseases of the female genital tract: Secondary | ICD-10-CM | POA: Diagnosis not present

## 2015-02-02 DIAGNOSIS — M79602 Pain in left arm: Secondary | ICD-10-CM

## 2015-02-02 DIAGNOSIS — M79601 Pain in right arm: Secondary | ICD-10-CM | POA: Insufficient documentation

## 2015-02-02 DIAGNOSIS — X58XXXA Exposure to other specified factors, initial encounter: Secondary | ICD-10-CM | POA: Diagnosis not present

## 2015-02-02 DIAGNOSIS — Z720 Tobacco use: Secondary | ICD-10-CM | POA: Insufficient documentation

## 2015-02-02 DIAGNOSIS — Z880 Allergy status to penicillin: Secondary | ICD-10-CM | POA: Insufficient documentation

## 2015-02-02 DIAGNOSIS — M1990 Unspecified osteoarthritis, unspecified site: Secondary | ICD-10-CM | POA: Insufficient documentation

## 2015-02-02 DIAGNOSIS — Z86018 Personal history of other benign neoplasm: Secondary | ICD-10-CM | POA: Diagnosis not present

## 2015-02-02 DIAGNOSIS — Z79899 Other long term (current) drug therapy: Secondary | ICD-10-CM | POA: Diagnosis not present

## 2015-02-02 DIAGNOSIS — Y9389 Activity, other specified: Secondary | ICD-10-CM | POA: Diagnosis not present

## 2015-02-02 DIAGNOSIS — Z791 Long term (current) use of non-steroidal anti-inflammatories (NSAID): Secondary | ICD-10-CM | POA: Insufficient documentation

## 2015-02-02 DIAGNOSIS — M791 Myalgia, unspecified site: Secondary | ICD-10-CM

## 2015-02-02 DIAGNOSIS — Y998 Other external cause status: Secondary | ICD-10-CM | POA: Insufficient documentation

## 2015-02-02 MED ORDER — HYDROCHLOROTHIAZIDE 25 MG PO TABS: 25.0000 mg | ORAL_TABLET | Freq: Every day | ORAL | Status: DC

## 2015-02-02 MED ORDER — NAPROXEN 500 MG PO TABS: 500.0000 mg | ORAL_TABLET | Freq: Two times a day (BID) | ORAL | Status: DC

## 2015-02-02 MED ORDER — METHOCARBAMOL 500 MG PO TABS: 500.0000 mg | ORAL_TABLET | Freq: Two times a day (BID) | ORAL | Status: DC

## 2015-02-02 MED ORDER — TRAMADOL HCL 50 MG PO TABS: 50.0000 mg | ORAL_TABLET | Freq: Four times a day (QID) | ORAL | Status: DC | PRN

## 2015-02-02 NOTE — ED Notes (Signed)
Pt comes in from home with left arm pain and numbness starting Saturday. Pt states she woke up this morning with right lip "swelling" and pain. No oral swelling noted. Pt is tearful on triage.

## 2015-02-02 NOTE — Discharge Instructions (Signed)
Stop Lisinopril--It causes angioedema (Your lip swelling) Begin Hydrochlorothiazide, a New Blood Pressure medicine.  Return here if your swelling gets any worse, or with tongue or throat swelling.  Angioedema Angioedema is a sudden swelling of tissues, often of the skin. It can occur on the face or genitals or in the abdomen or other body parts. The swelling usually develops over a short period and gets better in 24 to 48 hours. It often begins during the night and is found when the person wakes up. The person may also get red, itchy patches of skin (hives). Angioedema can be dangerous if it involves swelling of the air passages.  Depending on the cause, episodes of angioedema may only happen once, come back in unpredictable patterns, or repeat for several years and then gradually fade away.  CAUSES  Angioedema can be caused by an allergic reaction to various triggers. It can also result from nonallergic causes, including reactions to drugs, immune system disorders, viral infections, or an abnormal gene that is passed to you from your parents (hereditary). For some people with angioedema, the cause is unknown.  Some things that can trigger angioedema include:   Foods.   Medicines, such as ACE inhibitors, ARBs, nonsteroidal anti-inflammatory agents, or estrogen.   Latex.   Animal saliva.   Insect stings.   Dyes used in X-rays.   Mild injury.   Dental work.  Surgery.  Stress.   Sudden changes in temperature.   Exercise. SIGNS AND SYMPTOMS   Swelling of the skin.  Hives. If these are present, there is also intense itching.  Redness in the affected area.   Pain in the affected area.  Swollen lips or tongue.  Breathing problems. This may happen if the air passages swell.  Wheezing. If internal organs are involved, there may be:   Nausea.   Abdominal pain.   Vomiting.   Difficulty swallowing.   Difficulty passing urine. DIAGNOSIS   Your health  care provider will examine the affected area and take a medical and family history.  Various tests may be done to help determine the cause. Tests may include:  Allergy skin tests to see if the problem is an allergic reaction.   Blood tests to check for hereditary angioedema.   Tests to check for underlying diseases that could cause the condition.   A review of your medicines, including over-the-counter medicines, may be done. TREATMENT  Treatment will depend on the cause of the angioedema. Possible treatments include:   Removal of anything that triggered the condition (such as stopping certain medicines).   Medicines to treat symptoms or prevent attacks. Medicines given may include:   Antihistamines.   Epinephrine injection.   Steroids.   Hospitalization may be required for severe attacks. If the air passages are affected, it can be an emergency. Tubes may need to be placed to keep the airway open. HOME CARE INSTRUCTIONS   Take all medicines as directed by your health care provider.  If you were given medicines for emergency allergy treatment, always carry them with you.  Wear a medical bracelet as directed by your health care provider.   Avoid known triggers. SEEK MEDICAL CARE IF:   You have repeat attacks of angioedema.   Your attacks are more frequent or more severe despite preventive measures.   You have hereditary angioedema and are considering having children. It is important to discuss with your health care provider the risks of passing the condition on to your children. [place]  CARE IF:   You have severe swelling of the mouth, tongue, or lips.  You have difficulty breathing.   You have difficulty swallowing.   You faint. MAKE SURE YOU:  Understand these instructions.  Will watch your condition.  Will get help right away if you are not doing well or get worse. Document Released: 08/07/2001 Document Revised: 10/13/2013  Document Reviewed: 01/20/2013 [facility] Patient Information 2015 [place], [place]. This information is not intended to replace advice given to you by your health care provider. Make sure you discuss any questions you have with your health care provider.    Musculoskeletal Pain Musculoskeletal pain is muscle and boney aches and pains. These pains can occur in any part of the body. Your caregiver may treat you without knowing the cause of the pain. They may treat you if blood or urine tests, X-rays, and other tests were normal.  CAUSES There is often not a definite cause or reason for these pains. These pains may be caused by a type of germ (virus). The discomfort may also come from overuse. Overuse includes working out too hard when your body is not fit. Boney aches also come from weather changes. Bone is sensitive to atmospheric pressure changes. HOME CARE INSTRUCTIONS   Ask when your test results will be ready. Make sure you get your test results.  Only take over-the-counter or prescription medicines for pain, discomfort, or fever as directed by your caregiver. If you were given medications for your condition, do not drive, operate machinery or power tools, or sign legal documents for 24 hours. Do not drink alcohol. Do not take sleeping pills or other medications that may interfere with treatment.  Continue all activities unless the activities cause more pain. When the pain lessens, slowly resume normal activities. Gradually increase the intensity and duration of the activities or exercise.  During periods of severe pain, bed rest may be helpful. Lay or sit in any position that is comfortable.  Putting ice on the injured area.  Put ice in a bag.  Place a towel between your skin and the bag.  Leave the ice on for 15 to 20 minutes, 3 to 4 times a day.  Follow up with your caregiver for continued problems and no reason can be found for the pain. If the pain becomes worse or does not go away,  it may be necessary to repeat tests or do additional testing. Your caregiver may need to look further for a possible cause. SEEK IMMEDIATE MEDICAL CARE IF:  You have pain that is getting worse and is not relieved by medications.  You develop chest pain that is associated with shortness or breath, sweating, feeling sick to your stomach (nauseous), or throw up (vomit).  Your pain becomes localized to the abdomen.  You develop any new symptoms that seem different or that concern you. MAKE SURE YOU:   Understand these instructions.  Will watch your condition.  Will get help right away if you are not doing well or get worse. Document Released: 05/29/2005 Document Revised: 08/21/2011 Document Reviewed: 01/31/2013 [facility] Patient Information 2015 [place], [place]. This information is not intended to replace advice given to you by your health care provider. Make sure you discuss any questions you have with your health care provider.

## 2015-02-02 NOTE — ED Provider Notes (Signed)
CSN: [id number]     Arrival date & time 02/02/15  1139 History  This chart was scribed for Tanna Furry, MD by Terressa Koyanagi, ED Scribe. This patient was seen in room APA03/APA03 and the patient's care was started at 12:10 PM.   No chief complaint on file.  HPI PCP: Rosita Fire, MD HPI Comments: Nada NYKOLE MATOS is a 49 y.o. female, with PMHx noted below including HTN (pt notes her BP stays high all the time) and recurrent right arm pain onset a few years ago following an accident, who presents to the Emergency Department complaining of recurrent, atraumatic, acute, intermittent left arm pain with associated swelling onset 3 days ago. Pt states her current pain is consistent with past episodes of pain and notes the pain started in her upper arm and progressed down the left arm over the past couple of days. Pt denies any new activities or new injuries. Pt denies any other Sx    Pt also complains of swelling of her lips onset yesterday and worsening today. Pt reports she takes Lisinopril to manage high BP; pt reports taking a dose this morning.    Trace visible edema to upper and lower lip. Tongue normal.  Tender across right upper bicep Motor to hands bilaterally intact.  Good sensation LUE.  2 seconds cap refill bilaterally.  Biceps flexion reproduces pain.  Cranial nerves intact.   Past Medical History  Diagnosis Date  . 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 Hypertension   . Seizures     last seizure was a year ago  . Arthritis   . Irregular bleeding 03/26/2013  . Hot flashes 03/26/2013  . Endometrial polyp 04/28/2013  . Fibroids 04/28/2013  . Seasonal allergies    Past Surgical History  Procedure Laterality Date  . Cesarean section      x 3  . Examination under anesthesia  02/28/2012    Procedure: EXAM UNDER ANESTHESIA;  Surgeon: Donato Heinz, MD;  Location: [facility];  Service: General;  Laterality: N/A;  . Sphincterotomy  02/28/2012    Procedure: SPHINCTEROTOMY;   Surgeon: Donato Heinz, MD;  Location: [facility];  Service: General;  Laterality: N/A;  Lateral Internal Sphincterotomy  . Trigger finger release    . Ganglion cyst excision    . Tubal ligation    . Polypectomy N/A 05/23/2013    Procedure: ENDOMETRIAL POLYP REMOVAL;  Surgeon: Jonnie Kind, MD;  Location: [facility];  Service: Gynecology;  Laterality: N/A;  . Hysteroscopy w/d&c N/A 05/23/2013    Procedure: DILATATION AND CURETTAGE /HYSTEROSCOPY;  Surgeon: Jonnie Kind, MD;  Location: [facility];  Service: Gynecology;  Laterality: N/A;   Family History  Problem Relation Age of Onset  . Hypertension Mother   . Hypertension Father    Social History  Substance Use Topics  . Smoking status: Current Every Day Smoker -- 0.25 packs/day for 20 years    Types: Cigarettes  . Smokeless tobacco: Never Used  . Alcohol Use: Yes     Comment: occasional   OB History    Gravida Para Term Preterm AB TAB SAB Ectopic Multiple Living   4 3   1  1   3      Review of Systems  Constitutional: Negative for fever, chills, diaphoresis, appetite change and fatigue.  HENT: Negative for mouth sores, sore throat and trouble swallowing.   Eyes: Negative for visual disturbance.  Respiratory: Negative for cough, chest tightness, shortness of breath and wheezing.  Cardiovascular: Negative for chest pain.  Gastrointestinal: Negative for nausea, vomiting, abdominal pain, diarrhea and abdominal distention.  Endocrine: Negative for polydipsia, polyphagia and polyuria.  Genitourinary: Negative for dysuria, frequency and hematuria.  Musculoskeletal: Negative for gait problem.  Skin: Negative for color change, pallor and rash.  Neurological: Negative for dizziness, syncope, light-headedness and headaches.  Hematological: Does not bruise/bleed easily.  Psychiatric/Behavioral: Negative for behavioral problems and confusion.      Allergies  Lisinopril and Penicillins  Home Medications   Prior to Admission  medications   Medication Sig Start Date End Date Taking? Authorizing Provider  albuterol (PROVENTIL HFA;VENTOLIN HFA) 108 (90 BASE) MCG/ACT inhaler Inhale 4 puffs into the lungs at bedtime.    Yes Historical Provider, MD  cyclobenzaprine (FLEXERIL) 10 MG tablet Take 10 mg by mouth at bedtime as needed for muscle spasms.   Yes Historical Provider, MD  diphenhydramine-acetaminophen (TYLENOL PM) 25-500 MG TABS Take 1 tablet by mouth at bedtime as needed.   Yes Historical Provider, MD  gabapentin (NEURONTIN) 300 MG capsule Take 300 mg by mouth 2 (two) times daily.    Yes Historical Provider, MD  ibuprofen (ADVIL,MOTRIN) 800 MG tablet Take 1 tablet (800 mg total) by mouth 3 (three) times daily. 12/05/14  Yes Noemi Chapel, MD  levETIRAcetam (KEPPRA) 500 MG tablet Take 500 mg by mouth 2 (two) times daily.   Yes Historical Provider, MD  lisinopril (PRINIVIL,ZESTRIL) 20 MG tablet Take 20 mg by mouth daily.   Yes Historical Provider, MD  loratadine (CLARITIN) 10 MG tablet Take 10 mg by mouth daily.   Yes Historical Provider, MD  methocarbamol (ROBAXIN) 500 MG tablet Take 1 tablet (500 mg total) by mouth 2 (two) times daily as needed for muscle spasms. 12/05/14  Yes Noemi Chapel, MD  metoprolol (LOPRESSOR) 50 MG tablet Take 50 mg by mouth 3 (three) times daily.   Yes Historical Provider, MD  oxyCODONE-acetaminophen (PERCOCET) 7.5-325 MG per tablet TAKE 1 TABLET BY EVERY 6 HOURS AS NEEDED - DUE 01/02/15 01/02/15  Yes Historical Provider, MD   Triage Vitals: BP 194/102 mmHg  Pulse 63  Temp(Src) 98.3 F (36.8 C) (Oral)  Resp 18  Ht 5\' 1"  (1.549 m)  Wt 152 lb (68.947 kg)  BMI 28.74 kg/m2  SpO2 100% Physical Exam  Constitutional: She is oriented to person, place, and time. She appears well-developed and well-nourished. No distress.  HENT:  Head: Normocephalic.  Eyes: Conjunctivae are normal. Pupils are equal, round, and reactive to light. No scleral icterus.  Neck: Normal range of motion. Neck supple. No  thyromegaly present.  Cardiovascular: Normal rate and regular rhythm.  Exam reveals no gallop and no friction rub.   No murmur heard. Pulmonary/Chest: Effort normal and breath sounds normal. No respiratory distress. She has no wheezes. She has no rales.  Abdominal: Soft. Bowel sounds are normal. She exhibits no distension. There is no tenderness. There is no rebound.  Musculoskeletal: Normal range of motion.  Neurological: She is alert and oriented to person, place, and time.  Skin: Skin is warm and dry. No rash noted.  Psychiatric: She has a normal mood and affect. Her behavior is normal.    ED Course  Procedures (including critical care time) DIAGNOSTIC STUDIES: Oxygen Saturation is 100% on RA, nl by my interpretation.    COORDINATION OF CARE: 12:29 PM-Discussed treatment plan which includes RX, stopping Lisinopril with pt at bedside and pt agreed to plan.   Labs Review Labs Reviewed - No data to display  Imaging Review No results found. I have personally reviewed and evaluated these images and lab results as part of my medical decision-making.   EKG Interpretation None      MDM   Final diagnoses:  Muscle pain  Pain of left upper extremity  Angioedema, initial encounter    Reproducible arm pain.  Minimal, but evident right lip swelling c/w angioedema from the ACEI she takes.  Given return precautions.  Stop Lisinopril, begin HCTZ.   I personally performed the services described in this documentation, which was scribed in my presence. The recorded information has been reviewed and is accurate.    Tanna Furry, MD 02/02/15 1230

## 2015-03-02 ENCOUNTER — Encounter (HOSPITAL_COMMUNITY): Payer: Self-pay | Admitting: Emergency Medicine

## 2015-03-02 ENCOUNTER — Emergency Department (HOSPITAL_COMMUNITY)
Admission: EM | Admit: 2015-03-02 | Discharge: 2015-03-02 | Disposition: A | Discharge status: Home / Self Care | Payer: Medicaid Other | Attending: Emergency Medicine | Admitting: Emergency Medicine

## 2015-03-02 DIAGNOSIS — M5432 Sciatica, left side: Secondary | ICD-10-CM | POA: Diagnosis not present

## 2015-03-02 DIAGNOSIS — I10 Essential (primary) hypertension: Secondary | ICD-10-CM | POA: Diagnosis not present

## 2015-03-02 DIAGNOSIS — Z880 Allergy status to penicillin: Secondary | ICD-10-CM | POA: Diagnosis not present

## 2015-03-02 DIAGNOSIS — Z720 Tobacco use: Secondary | ICD-10-CM | POA: Diagnosis not present

## 2015-03-02 DIAGNOSIS — M545 Low back pain: Secondary | ICD-10-CM | POA: Diagnosis present

## 2015-03-02 DIAGNOSIS — Z8742 Personal history of other diseases of the female genital tract: Secondary | ICD-10-CM | POA: Insufficient documentation

## 2015-03-02 DIAGNOSIS — M1990 Unspecified osteoarthritis, unspecified site: Secondary | ICD-10-CM | POA: Insufficient documentation

## 2015-03-02 DIAGNOSIS — Z79899 Other long term (current) drug therapy: Secondary | ICD-10-CM | POA: Insufficient documentation

## 2015-03-02 DIAGNOSIS — G40909 Epilepsy, unspecified, not intractable, without status epilepticus: Secondary | ICD-10-CM | POA: Insufficient documentation

## 2015-03-02 DIAGNOSIS — Z86018 Personal history of other benign neoplasm: Secondary | ICD-10-CM | POA: Insufficient documentation

## 2015-03-02 LAB — URINALYSIS, ROUTINE W REFLEX MICROSCOPIC
Bilirubin Urine: NEGATIVE
Glucose, UA: NEGATIVE mg/dL
Ketones, ur: NEGATIVE mg/dL
Leukocytes, UA: NEGATIVE
Nitrite: NEGATIVE
Protein, ur: NEGATIVE mg/dL
Specific Gravity, Urine: >1.03 — ABNORMAL HIGH (ref 1.005–1.030)
Urobilinogen, UA: 0.2 mg/dL (ref 0.0–1.0)
pH: 6 (ref 5.0–8.0)

## 2015-03-02 LAB — URINE MICROSCOPIC-ADD ON

## 2015-03-02 MED ORDER — NAPROXEN 500 MG PO TABS: 500.0000 mg | ORAL_TABLET | Freq: Two times a day (BID) | ORAL | Status: DC

## 2015-03-02 MED ORDER — OXYCODONE-ACETAMINOPHEN 5-325 MG PO TABS
2.0000 | ORAL_TABLET | Freq: Once | ORAL | Status: AC
  Administered 2015-03-02: 2 via ORAL
  Filled 2015-03-02: qty 2

## 2015-03-02 MED ORDER — METHOCARBAMOL 500 MG PO TABS: 500.0000 mg | ORAL_TABLET | Freq: Three times a day (TID) | ORAL | Status: DC

## 2015-03-02 NOTE — ED Notes (Signed)
Pt reports lower back pain and left knee pain. Pt denies any recent injury. Pt reports seen for same at pcp and reports is being referred to pain clinic. nad noted.

## 2015-03-02 NOTE — ED Provider Notes (Signed)
CSN: [id number]     Arrival date & time 03/02/15  3220 History   First MD Initiated Contact with Patient 03/02/15 [phone]     Chief Complaint  Patient presents with  . Back Pain     (Consider location/radiation/quality/duration/timing/severity/associated sxs/prior Treatment) HPI   Judy Cross is a 49 y.o. female who presents to the Emergency Department complaining of low back and left knee pain. She states symptoms have been present for several days. This is a recurring problem. She describes a sharp pain in her lower back that radiates down her left buttocks and into her left knee. States the pain is worse with bending and walking improved somewhat at rest. She states that she has been seen for this by her primary doctor and is being referred to pain management she states that she has ran out of her narcotic pain medication.  She denies fever, abdominal pain, dysuria, numbness or weakness to the lower extremities, urine or bowel incontinence or retention.   Past Medical History  Diagnosis Date  . 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 Hypertension   . Seizures     last seizure was a year ago  . Arthritis   . Irregular bleeding 03/26/2013  . Hot flashes 03/26/2013  . Endometrial polyp 04/28/2013  . Fibroids 04/28/2013  . Seasonal allergies    Past Surgical History  Procedure Laterality Date  . Cesarean section      x 3  . Examination under anesthesia  02/28/2012    Procedure: EXAM UNDER ANESTHESIA;  Surgeon: Donato Heinz, MD;  Location: [facility];  Service: General;  Laterality: N/A;  . Sphincterotomy  02/28/2012    Procedure: SPHINCTEROTOMY;  Surgeon: Donato Heinz, MD;  Location: [facility];  Service: General;  Laterality: N/A;  Lateral Internal Sphincterotomy  . Trigger finger release    . Ganglion cyst excision    . Tubal ligation    . Polypectomy N/A 05/23/2013    Procedure: ENDOMETRIAL POLYP REMOVAL;  Surgeon: Jonnie Kind, MD;  Location: [facility];  Service:  Gynecology;  Laterality: N/A;  . Hysteroscopy w/d&c N/A 05/23/2013    Procedure: DILATATION AND CURETTAGE /HYSTEROSCOPY;  Surgeon: Jonnie Kind, MD;  Location: [facility];  Service: Gynecology;  Laterality: N/A;   Family History  Problem Relation Age of Onset  . Hypertension Mother   . Hypertension Father    Social History  Substance Use Topics  . Smoking status: Current Every Day Smoker -- 0.25 packs/day for 20 years    Types: Cigarettes  . Smokeless tobacco: Never Used  . Alcohol Use: Yes     Comment: occasional   OB History    Gravida Para Term Preterm AB TAB SAB Ectopic Multiple Living   4 3   1  1   3      Review of Systems  Constitutional: Negative for fever.  Respiratory: Negative for shortness of breath.   Gastrointestinal: Negative for vomiting, abdominal pain and constipation.  Genitourinary: Negative for dysuria, hematuria, flank pain, decreased urine volume and difficulty urinating.  Musculoskeletal: Positive for back pain. Negative for joint swelling.  Skin: Negative for rash.  Neurological: Negative for weakness and numbness.  All other systems reviewed and are negative.     Allergies  Lisinopril; Tramadol; and Penicillins  Home Medications   Prior to Admission medications   Medication Sig Start Date End Date Taking? Authorizing Provider  albuterol (PROVENTIL HFA;VENTOLIN HFA) 108 (90 BASE) MCG/ACT inhaler Inhale 4 puffs  into the lungs at bedtime.    Yes Historical Provider, MD  cyclobenzaprine (FLEXERIL) 10 MG tablet Take 10 mg by mouth at bedtime as needed for muscle spasms.   Yes Historical Provider, MD  diphenhydramine-acetaminophen (TYLENOL PM) 25-500 MG TABS Take 1 tablet by mouth at bedtime as needed.   Yes Historical Provider, MD  gabapentin (NEURONTIN) 300 MG capsule Take 300 mg by mouth 2 (two) times daily.    Yes Historical Provider, MD  hydrochlorothiazide (HYDRODIURIL) 25 MG tablet Take 1 tablet (25 mg total) by mouth daily. 02/02/15  Yes Tanna Furry, MD  ibuprofen (ADVIL,MOTRIN) 800 MG tablet Take 1 tablet (800 mg total) by mouth 3 (three) times daily. 12/05/14  Yes Noemi Chapel, MD  levETIRAcetam (KEPPRA) 500 MG tablet Take 500 mg by mouth 2 (two) times daily.   Yes Historical Provider, MD  methocarbamol (ROBAXIN) 500 MG tablet Take 1 tablet (500 mg total) by mouth 2 (two) times daily. 02/02/15  Yes Tanna Furry, MD  metoprolol (LOPRESSOR) 50 MG tablet Take 50 mg by mouth 3 (three) times daily.   Yes Historical Provider, MD  naproxen (NAPROSYN) 500 MG tablet Take 1 tablet (500 mg total) by mouth 2 (two) times daily. 02/02/15  Yes Tanna Furry, MD  traMADol (ULTRAM) 50 MG tablet Take 1 tablet (50 mg total) by mouth every 6 (six) hours as needed. 02/02/15  Yes Tanna Furry, MD   BP 150/93 mmHg  Pulse 88  Temp(Src) 98 F (36.7 C) (Oral)  Resp 18  Ht 5\' 1"  (1.549 m)  Wt 152 lb (68.947 kg)  BMI 28.74 kg/m2  SpO2 100% Physical Exam  Constitutional: She is oriented to person, place, and time. She appears well-developed and well-nourished. No distress.  HENT:  Head: Normocephalic and atraumatic.  Neck: Normal range of motion. Neck supple.  Cardiovascular: Normal rate, regular rhythm, normal heart sounds and intact distal pulses.   No murmur heard. Pulmonary/Chest: Effort normal and breath sounds normal. No respiratory distress.  Abdominal: Soft. She exhibits no distension. There is no tenderness. There is no rebound and no guarding.  Musculoskeletal: She exhibits tenderness. She exhibits no edema.       Lumbar back: She exhibits tenderness and pain. She exhibits normal range of motion, no swelling, no deformity, no laceration and normal pulse.  ttp of the lower lumbar spine and left paraspinal muscles.   DP pulses are brisk and symmetrical.  Distal sensation intact.  Hip Flexors/Extensors are intact.  Pt has 5/5 strength against resistance of bilateral lower extremities.  Left knee is nontender on exam, no erythema or effusion.    Neurological: She is alert and oriented to person, place, and time. She has normal strength. No sensory deficit. She exhibits normal muscle tone. Coordination and gait normal.  Reflex Scores:      Patellar reflexes are 2+ on the right side and 2+ on the left side.      Achilles reflexes are 2+ on the right side and 2+ on the left side. Skin: Skin is warm and dry. No rash noted.  Nursing note and vitals reviewed.   ED Course  Procedures (including critical care time) Labs Review Labs Reviewed  URINALYSIS, ROUTINE W REFLEX MICROSCOPIC (NOT AT [facility]) - Abnormal; Notable for the following:    Specific Gravity, Urine >1.030 (*)    Hgb urine dipstick SMALL (*)    All other components within normal limits  URINE MICROSCOPIC-ADD ON - Abnormal; Notable for the following:    Bacteria,  UA FEW (*)    All other components within normal limits    Imaging Review No results found. I have personally reviewed and evaluated these images and lab results as part of my medical decision-making.   EKG Interpretation None      MDM   Final diagnoses:  Sciatica neuralgia, left    Pt ambulates with a steady gait. No focal neuro deficits.  No concerning sx's for emergent neurological or infectious process.  Pt is waiting for appt with pain management in [place].    Pt reviewed on the [employer] narcotic database.  Received #60 percocet 7.5/325mg  on 02/19/15.  She was advised and understands that further prescription narcotics are not indicated and will not be prescribed.    Kem Parkinson, PA-C 03/03/15 1804  Milton Ferguson, MD 03/05/15 [phone]

## 2015-03-02 NOTE — Discharge Instructions (Signed)
Sciatica °Sciatica is pain, weakness, numbness, or tingling along your sciatic nerve. The nerve starts in the lower back and runs down the back of each leg. Nerve damage or certain conditions pinch or put pressure on the sciatic nerve. This causes the pain, weakness, and other discomforts of sciatica. °HOME CARE  °· Only take medicine as told by your doctor. °· Apply ice to the affected area for 20 minutes. Do this 3-4 times a day for the first 48-72 hours. Then try heat in the same way. °· Exercise, stretch, or do your usual activities if these do not make your pain worse. °· Go to physical therapy as told by your doctor. °· Keep all doctor visits as told. °· Do not wear high heels or shoes that are not supportive. °· Get a firm mattress if your mattress is too soft to lessen pain and discomfort. °GET HELP RIGHT AWAY IF:  °· You cannot control when you poop (bowel movement) or pee (urinate). °· You have more weakness in your lower back, lower belly (pelvis), butt (buttocks), or legs. °· You have redness or puffiness (swelling) of your back. °· You have a burning feeling when you pee. °· You have pain that gets worse when you lie down. °· You have pain that wakes you from your sleep. °· Your pain is worse than past pain. °· Your pain lasts longer than 4 weeks. °· You are suddenly losing weight without reason. °MAKE SURE YOU:  °· Understand these instructions. °· Will watch this condition. °· Will get help right away if you are not doing well or get worse. °Document Released: 03/07/2008 Document Revised: 11/28/2011 Document Reviewed: 10/08/2011 °[employer]® Patient Information ©2015 [employer]. This information is not intended to replace advice given to you by your health care provider. Make sure you discuss any questions you have with your health care provider. ° °

## 2015-03-11 ENCOUNTER — Encounter (HOSPITAL_COMMUNITY): Payer: Self-pay | Admitting: Emergency Medicine

## 2015-03-11 ENCOUNTER — Emergency Department (HOSPITAL_COMMUNITY)
Admission: EM | Admit: 2015-03-11 | Discharge: 2015-03-11 | Disposition: A | Discharge status: Home / Self Care | Payer: Medicaid Other | Attending: Emergency Medicine | Admitting: Emergency Medicine

## 2015-03-11 DIAGNOSIS — M545 Low back pain: Secondary | ICD-10-CM | POA: Diagnosis present

## 2015-03-11 DIAGNOSIS — Z720 Tobacco use: Secondary | ICD-10-CM | POA: Insufficient documentation

## 2015-03-11 DIAGNOSIS — M1990 Unspecified osteoarthritis, unspecified site: Secondary | ICD-10-CM | POA: Diagnosis not present

## 2015-03-11 DIAGNOSIS — I10 Essential (primary) hypertension: Secondary | ICD-10-CM | POA: Insufficient documentation

## 2015-03-11 DIAGNOSIS — Z86010 Personal history of colonic polyps: Secondary | ICD-10-CM | POA: Insufficient documentation

## 2015-03-11 DIAGNOSIS — Z791 Long term (current) use of non-steroidal anti-inflammatories (NSAID): Secondary | ICD-10-CM | POA: Insufficient documentation

## 2015-03-11 DIAGNOSIS — G40909 Epilepsy, unspecified, not intractable, without status epilepticus: Secondary | ICD-10-CM | POA: Diagnosis not present

## 2015-03-11 DIAGNOSIS — Z79899 Other long term (current) drug therapy: Secondary | ICD-10-CM | POA: Diagnosis not present

## 2015-03-11 DIAGNOSIS — G8929 Other chronic pain: Secondary | ICD-10-CM | POA: Diagnosis not present

## 2015-03-11 DIAGNOSIS — Z880 Allergy status to penicillin: Secondary | ICD-10-CM | POA: Insufficient documentation

## 2015-03-11 DIAGNOSIS — Z8742 Personal history of other diseases of the female genital tract: Secondary | ICD-10-CM | POA: Insufficient documentation

## 2015-03-11 DIAGNOSIS — M5432 Sciatica, left side: Secondary | ICD-10-CM | POA: Insufficient documentation

## 2015-03-11 MED ORDER — OXYCODONE-ACETAMINOPHEN 5-325 MG PO TABS
2.0000 | ORAL_TABLET | Freq: Once | ORAL | Status: AC
Start: 2015-03-11 — End: 2015-03-11
  Administered 2015-03-11: 2 via ORAL
  Filled 2015-03-11: qty 2

## 2015-03-11 MED ORDER — OXYCODONE-ACETAMINOPHEN 5-325 MG PO TABS: 1.0000 | ORAL_TABLET | Freq: Four times a day (QID) | ORAL | Status: DC | PRN

## 2015-03-11 MED ORDER — PREDNISONE 10 MG PO TABS: ORAL_TABLET | ORAL | Status: DC

## 2015-03-11 NOTE — ED Notes (Signed)
Pt states she is having flare of chronic back pain.  Is waiting to see pain management specialist.  Seen at [facility] Friday and given muscle relaxers but not helping.

## 2015-03-11 NOTE — ED Provider Notes (Signed)
CSN: [id number]     Arrival date & time 03/11/15  1652 History   First MD Initiated Contact with Patient 03/11/15 1701     Chief Complaint  Patient presents with  . Back Pain     (Consider location/radiation/quality/duration/timing/severity/associated sxs/prior Treatment) HPI  Judy Cross is a 49 y.o. female who presents to the Emergency Department complaining of worsening of her chronic low back pain.  She was seen here by me on 03/02/15 for same.  She describes a sharp pain radiating from her left lower back into his left leg.  Pain is worse with certain movements and improves at rest.  She has tried muscle relaxer's and OTC analgesics without relief.  She is waiting for an appt with pain management.  She denies recent injury, numbness of weakness of the LE's, abd pain, urine or bowel changes, fever, chills    Past Medical History  Diagnosis Date  . 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 Hypertension   . Seizures     last seizure was a year ago  . Arthritis   . Irregular bleeding 03/26/2013  . Hot flashes 03/26/2013  . Endometrial polyp 04/28/2013  . Fibroids 04/28/2013  . Seasonal allergies    Past Surgical History  Procedure Laterality Date  . Cesarean section      x 3  . Examination under anesthesia  02/28/2012    Procedure: EXAM UNDER ANESTHESIA;  Surgeon: Donato Heinz, MD;  Location: [facility];  Service: General;  Laterality: N/A;  . Sphincterotomy  02/28/2012    Procedure: SPHINCTEROTOMY;  Surgeon: Donato Heinz, MD;  Location: [facility];  Service: General;  Laterality: N/A;  Lateral Internal Sphincterotomy  . Trigger finger release    . Ganglion cyst excision    . Tubal ligation    . Polypectomy N/A 05/23/2013    Procedure: ENDOMETRIAL POLYP REMOVAL;  Surgeon: Jonnie Kind, MD;  Location: [facility];  Service: Gynecology;  Laterality: N/A;  . Hysteroscopy w/d&c N/A 05/23/2013    Procedure: DILATATION AND CURETTAGE /HYSTEROSCOPY;  Surgeon: Jonnie Kind, MD;   Location: [facility];  Service: Gynecology;  Laterality: N/A;   Family History  Problem Relation Age of Onset  . Hypertension Mother   . Hypertension Father    Social History  Substance Use Topics  . Smoking status: Current Every Day Smoker -- 0.25 packs/day for 20 years    Types: Cigarettes  . Smokeless tobacco: Never Used  . Alcohol Use: Yes     Comment: occasional   OB History    Gravida Para Term Preterm AB TAB SAB Ectopic Multiple Living   4 3   1  1   3      Review of Systems  Constitutional: Negative for fever.  Respiratory: Negative for shortness of breath.   Gastrointestinal: Negative for vomiting, abdominal pain and constipation.  Genitourinary: Negative for dysuria, hematuria, flank pain, decreased urine volume and difficulty urinating.  Musculoskeletal: Positive for back pain. Negative for joint swelling.  Skin: Negative for rash.  Neurological: Negative for weakness and numbness.  All other systems reviewed and are negative.     Allergies  Lisinopril; Tramadol; and Penicillins  Home Medications   Prior to Admission medications   Medication Sig Start Date End Date Taking? Authorizing Provider  albuterol (PROVENTIL HFA;VENTOLIN HFA) 108 (90 BASE) MCG/ACT inhaler Inhale 4 puffs into the lungs at bedtime.     Historical Provider, MD  cyclobenzaprine (FLEXERIL) 10 MG tablet Take 10  mg by mouth at bedtime as needed for muscle spasms.    Historical Provider, MD  diphenhydramine-acetaminophen (TYLENOL PM) 25-500 MG TABS Take 1 tablet by mouth at bedtime as needed.    Historical Provider, MD  gabapentin (NEURONTIN) 300 MG capsule Take 300 mg by mouth 2 (two) times daily.     Historical Provider, MD  hydrochlorothiazide (HYDRODIURIL) 25 MG tablet Take 1 tablet (25 mg total) by mouth daily. 02/02/15   Tanna Furry, MD  ibuprofen (ADVIL,MOTRIN) 800 MG tablet Take 1 tablet (800 mg total) by mouth 3 (three) times daily. 12/05/14   Noemi Chapel, MD  levETIRAcetam (KEPPRA) 500 MG  tablet Take 500 mg by mouth 2 (two) times daily.    Historical Provider, MD  methocarbamol (ROBAXIN) 500 MG tablet Take 1 tablet (500 mg total) by mouth 3 (three) times daily. 03/02/15   Glenden Rossell, PA-C  metoprolol (LOPRESSOR) 50 MG tablet Take 50 mg by mouth 3 (three) times daily.    Historical Provider, MD  naproxen (NAPROSYN) 500 MG tablet Take 1 tablet (500 mg total) by mouth 2 (two) times daily with a meal. 03/02/15   Kioni Stahl, PA-C  traMADol (ULTRAM) 50 MG tablet Take 1 tablet (50 mg total) by mouth every 6 (six) hours as needed. 02/02/15   Tanna Furry, MD   BP 150/72 mmHg  Pulse 96  Temp(Src) 97.5 F (36.4 C) (Oral)  Resp 18  Ht 5\' 1"  (1.549 m)  Wt 152 lb (68.947 kg)  BMI 28.74 kg/m2  SpO2 100% Physical Exam  Constitutional: She is oriented to person, place, and time. She appears well-developed and well-nourished. No distress.  HENT:  Head: Normocephalic and atraumatic.  Neck: Normal range of motion. Neck supple.  Cardiovascular: Normal rate, regular rhythm, normal heart sounds and intact distal pulses.   No murmur heard. Pulmonary/Chest: Effort normal and breath sounds normal. No respiratory distress.  Abdominal: Soft. She exhibits no distension. There is no tenderness. There is no rebound.  Musculoskeletal: She exhibits tenderness. She exhibits no edema.       Lumbar back: She exhibits tenderness and pain. She exhibits normal range of motion, no swelling, no deformity, no laceration and normal pulse.  ttp of the left spine and lumbar paraspinal muscles.    DP pulses are brisk and symmetrical.  Distal sensation intact.  Hip Flexors/Extensors are intact.  Pt has 5/5 strength against resistance of bilateral lower extremities.     Neurological: She is alert and oriented to person, place, and time. She has normal strength. No sensory deficit. She exhibits normal muscle tone. Coordination and gait normal.  Reflex Scores:      Patellar reflexes are 2+ on the right side and 2+  on the left side.      Achilles reflexes are 2+ on the right side and 2+ on the left side. Skin: Skin is warm and dry. No rash noted.  Nursing note and vitals reviewed.   ED Course  Procedures (including critical care time) Labs Review Labs Reviewed - No data to display  Imaging Review No results found. I have personally reviewed and evaluated these images and lab results as part of my medical decision-making.   EKG Interpretation None      MDM   Final diagnoses:  Sciatica neuralgia, left   Pt is uncomfortable, but non-toxic appearing.  No focal neuro deficits, no concerning sx's for emergent neurological or infectious process.  This is a chronic issue.  Pt also seen by me on 03/02/15  for same.  [place] narcotic database reviewed on previous visit and again today, last reported narcotics was same as previous which was #60 percocet 7.5 mg on 02/19/15     Kem Parkinson, PA-C 03/14/15 [place], MD 03/16/15 0700

## 2015-03-11 NOTE — Discharge Instructions (Signed)
Sciatica °Sciatica is pain, weakness, numbness, or tingling along your sciatic nerve. The nerve starts in the lower back and runs down the back of each leg. Nerve damage or certain conditions pinch or put pressure on the sciatic nerve. This causes the pain, weakness, and other discomforts of sciatica. °HOME CARE  °· Only take medicine as told by your doctor. °· Apply ice to the affected area for 20 minutes. Do this 3-4 times a day for the first 48-72 hours. Then try heat in the same way. °· Exercise, stretch, or do your usual activities if these do not make your pain worse. °· Go to physical therapy as told by your doctor. °· Keep all doctor visits as told. °· Do not wear high heels or shoes that are not supportive. °· Get a firm mattress if your mattress is too soft to lessen pain and discomfort. °GET HELP RIGHT AWAY IF:  °· You cannot control when you poop (bowel movement) or pee (urinate). °· You have more weakness in your lower back, lower belly (pelvis), butt (buttocks), or legs. °· You have redness or puffiness (swelling) of your back. °· You have a burning feeling when you pee. °· You have pain that gets worse when you lie down. °· You have pain that wakes you from your sleep. °· Your pain is worse than past pain. °· Your pain lasts longer than 4 weeks. °· You are suddenly losing weight without reason. °MAKE SURE YOU:  °· Understand these instructions. °· Will watch this condition. °· Will get help right away if you are not doing well or get worse. °Document Released: 03/07/2008 Document Revised: 11/28/2011 Document Reviewed: 10/08/2011 °[employer]® Patient Information ©2015 [employer]. This information is not intended to replace advice given to you by your health care provider. Make sure you discuss any questions you have with your health care provider. ° °

## 2015-03-23 ENCOUNTER — Encounter (HOSPITAL_COMMUNITY): Payer: Self-pay | Admitting: Emergency Medicine

## 2015-03-23 ENCOUNTER — Emergency Department (HOSPITAL_COMMUNITY)
Admission: EM | Admit: 2015-03-23 | Discharge: 2015-03-23 | Disposition: A | Discharge status: Home / Self Care | Payer: Medicaid Other | Attending: Emergency Medicine | Admitting: Emergency Medicine

## 2015-03-23 DIAGNOSIS — M1990 Unspecified osteoarthritis, unspecified site: Secondary | ICD-10-CM | POA: Diagnosis not present

## 2015-03-23 DIAGNOSIS — G40909 Epilepsy, unspecified, not intractable, without status epilepticus: Secondary | ICD-10-CM | POA: Diagnosis not present

## 2015-03-23 DIAGNOSIS — Z8742 Personal history of other diseases of the female genital tract: Secondary | ICD-10-CM | POA: Insufficient documentation

## 2015-03-23 DIAGNOSIS — Z79899 Other long term (current) drug therapy: Secondary | ICD-10-CM | POA: Diagnosis not present

## 2015-03-23 DIAGNOSIS — I10 Essential (primary) hypertension: Secondary | ICD-10-CM | POA: Diagnosis not present

## 2015-03-23 DIAGNOSIS — Z880 Allergy status to penicillin: Secondary | ICD-10-CM | POA: Diagnosis not present

## 2015-03-23 DIAGNOSIS — Z720 Tobacco use: Secondary | ICD-10-CM | POA: Insufficient documentation

## 2015-03-23 DIAGNOSIS — G8929 Other chronic pain: Secondary | ICD-10-CM | POA: Insufficient documentation

## 2015-03-23 DIAGNOSIS — Z86018 Personal history of other benign neoplasm: Secondary | ICD-10-CM | POA: Diagnosis not present

## 2015-03-23 DIAGNOSIS — M5442 Lumbago with sciatica, left side: Secondary | ICD-10-CM | POA: Diagnosis not present

## 2015-03-23 DIAGNOSIS — M545 Low back pain: Secondary | ICD-10-CM | POA: Diagnosis present

## 2015-03-23 MED ORDER — HYDROMORPHONE HCL 1 MG/ML IJ SOLN
1.0000 mg | Freq: Once | INTRAMUSCULAR | Status: AC
  Administered 2015-03-23: 1 mg via INTRAMUSCULAR
  Filled 2015-03-23: qty 1

## 2015-03-23 MED ORDER — OXYCODONE-ACETAMINOPHEN 5-325 MG PO TABS: 1.0000 | ORAL_TABLET | ORAL | Status: DC | PRN

## 2015-03-23 NOTE — ED Notes (Signed)
Pt c/o sharp, cramping lower back that radiates down the left leg. Pt reports she has had this pain for a very long time and it worsened yesterday after cleaning her house. She has been set up to see Dr. Francesco Runner, a pain management doctor, but doesn't have an appt until Friday 03/26/15. Pt reports taking Naproxen and Flexeril yesterday with no relief. Pt tearful upon assessment.

## 2015-03-23 NOTE — ED Notes (Signed)
Back pain increased yesterday, rates pain 10/10.  Took Naprosyn and flexeril yesterday, without relief.  Next pain management is Friday at 1430 with Dr Francesco Runner in [place].

## 2015-03-23 NOTE — ED Provider Notes (Addendum)
CSN: [id number]     Arrival date & time 03/23/15  0904 History  By signing my name below, I, Starleen Arms, attest that this documentation has been prepared under the direction and in the presence of Nat Christen, MD. Electronically Signed: Starleen Arms, ED Scribe. 03/23/2015. 12:45 PM.    Chief Complaint  Patient presents with  . Back Pain   The history is provided by the patient. No language interpreter was used.   HPI Comments: Judy Cross is a 49 y.o. female with hx of chronic low back pain secondary to herniated disc who presents to the Emergency Department complaining of worsened low back pack onset several years ago.  Associated symptoms include intermittent tingling numbness down the left leg. The patient has been seen by orthopaedics, neurosurgery, and PCP.  She is scheduled for f/u with pain management, Dr. Francesco Runner, on 10/14, and, per patient, surgical intervention has not been indicated.  No improvement with robaxin, naproxen, ibuprofen. Patient able to ambulate normally to baseline with cane.  No bowel or bladder incontinence.   Past Medical History  Diagnosis Date  . 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 Hypertension   . Seizures ([place])     last seizure was a year ago  . Arthritis   . Irregular bleeding 03/26/2013  . Hot flashes 03/26/2013  . Endometrial polyp 04/28/2013  . Fibroids 04/28/2013  . Seasonal allergies    Past Surgical History  Procedure Laterality Date  . Cesarean section      x 3  . Examination under anesthesia  02/28/2012    Procedure: EXAM UNDER ANESTHESIA;  Surgeon: Donato Heinz, MD;  Location: [facility];  Service: General;  Laterality: N/A;  . Sphincterotomy  02/28/2012    Procedure: SPHINCTEROTOMY;  Surgeon: Donato Heinz, MD;  Location: [facility];  Service: General;  Laterality: N/A;  Lateral Internal Sphincterotomy  . Trigger finger release    . Ganglion cyst excision    . Tubal ligation    . Polypectomy N/A 05/23/2013    Procedure:  ENDOMETRIAL POLYP REMOVAL;  Surgeon: Jonnie Kind, MD;  Location: [facility];  Service: Gynecology;  Laterality: N/A;  . Hysteroscopy w/d&c N/A 05/23/2013    Procedure: DILATATION AND CURETTAGE /HYSTEROSCOPY;  Surgeon: Jonnie Kind, MD;  Location: [facility];  Service: Gynecology;  Laterality: N/A;   Family History  Problem Relation Age of Onset  . Hypertension Mother   . Hypertension Father    Social History  Substance Use Topics  . Smoking status: Current Every Day Smoker -- 0.25 packs/day for 20 years    Types: Cigarettes  . Smokeless tobacco: Never Used  . Alcohol Use: Yes     Comment: occasional   OB History    Gravida Para Term Preterm AB TAB SAB Ectopic Multiple Living   4 3   1  1   3      Review of Systems A complete 10 system review of systems was obtained and all systems are negative except as noted in the HPI and PMH.   Allergies  Lisinopril; Tramadol; and Penicillins  Home Medications   Prior to Admission medications   Medication Sig Start Date End Date Taking? Authorizing Provider  diphenhydramine-acetaminophen (TYLENOL PM) 25-500 MG TABS Take 1 tablet by mouth at bedtime as needed (sleep).    Yes Historical Provider, MD  gabapentin (NEURONTIN) 300 MG capsule Take 300 mg by mouth 2 (two) times daily.    Yes Historical Provider, MD  hydrochlorothiazide (HYDRODIURIL) 25 MG tablet Take 1 tablet (25 mg total) by mouth daily. 02/02/15  Yes Tanna Furry, MD  levETIRAcetam (KEPPRA) 500 MG tablet Take 500 mg by mouth 2 (two) times daily.   Yes Historical Provider, MD  methocarbamol (ROBAXIN) 500 MG tablet Take 1 tablet (500 mg total) by mouth 3 (three) times daily. 03/02/15  Yes Tammy Triplett, PA-C  metoprolol (LOPRESSOR) 50 MG tablet Take 50 mg by mouth 3 (three) times daily.   Yes Historical Provider, MD  albuterol (PROVENTIL HFA;VENTOLIN HFA) 108 (90 BASE) MCG/ACT inhaler Inhale 4 puffs into the lungs at bedtime.     Historical Provider, MD  cyclobenzaprine (FLEXERIL) 10 MG  tablet Take 10 mg by mouth at bedtime as needed for muscle spasms.    Historical Provider, MD  oxyCODONE-acetaminophen (PERCOCET) 5-325 MG tablet Take 1-2 tablets by mouth every 4 (four) hours as needed. 03/23/15   Nat Christen, MD  predniSONE (DELTASONE) 10 MG tablet Take 6 tablets day one, 5 tablets day two, 4 tablets day three, 3 tablets day four, 2 tablets day five, then 1 tablet day six Patient not taking: Reported on 03/23/2015 03/11/15   Tammy Triplett, PA-C   BP 125/96 mmHg  Pulse 60  Temp(Src) 98.1 F (36.7 C) (Oral)  Resp 18  Ht 5\' 1"  (1.549 m)  Wt 152 lb (68.947 kg)  BMI 28.74 kg/m2  SpO2 99% Physical Exam  Constitutional: She is oriented to person, place, and time. She appears well-developed and well-nourished.  HENT:  Head: Normocephalic and atraumatic.  Eyes: Conjunctivae and EOM are normal. Pupils are equal, round, and reactive to light.  Neck: Normal range of motion. Neck supple.  Cardiovascular: Normal rate and regular rhythm.   Pulmonary/Chest: Effort normal and breath sounds normal.  Abdominal: Soft. Bowel sounds are normal.  Musculoskeletal: Normal range of motion.  Tenderness in lower back. Pain with SLR on left.  Neurological: She is alert and oriented to person, place, and time.  Skin: Skin is warm and dry.  Psychiatric: She has a normal mood and affect. Her behavior is normal.  Nursing note and vitals reviewed.   ED Course  Procedures (including critical care time)  DIAGNOSTIC STUDIES: Oxygen Saturation is 100% on RA, normal by my interpretation.    COORDINATION OF CARE:  9:30 AM Will order pain medication.  Patient acknowledges and agrees with plan.    Labs Review Labs Reviewed - No data to display  Imaging Review No results found. I have personally reviewed and evaluated these images and lab results as part of my medical decision-making.   EKG Interpretation None      MDM   Final diagnoses:  Midline low back pain with left-sided sciatica     Patient with known herniated nucleus pulposus and associated sciatica presents with worsening low back pain with radiation to left leg. She has an appointment with a pain management clinic on Friday. Discharge medication Percocet  I, Merci Walthers, personally performed the services described in this documentation. All medical record entries made by the scribe were at my direction and in my presence.  I have reviewed the chart and discharge instructions and agree that the record reflects my personal performance and is accurate and complete. Masen Salvas.  03/23/2015. 12:45 PM.     Nat Christen, MD 03/23/15 [place], MD 03/23/15 1245

## 2015-03-23 NOTE — ED Notes (Signed)
MD at bedside. 

## 2015-03-23 NOTE — Discharge Instructions (Signed)
Medication for pain. Follow-up with your pain management clinic on Friday.

## 2015-04-06 ENCOUNTER — Emergency Department (HOSPITAL_COMMUNITY)
Admission: EM | Admit: 2015-04-06 | Discharge: 2015-04-06 | Disposition: A | Discharge status: Home / Self Care | Payer: Medicaid Other | Attending: Emergency Medicine | Admitting: Emergency Medicine

## 2015-04-06 ENCOUNTER — Encounter (HOSPITAL_COMMUNITY): Payer: Self-pay

## 2015-04-06 DIAGNOSIS — Z8742 Personal history of other diseases of the female genital tract: Secondary | ICD-10-CM | POA: Insufficient documentation

## 2015-04-06 DIAGNOSIS — Z79899 Other long term (current) drug therapy: Secondary | ICD-10-CM | POA: Diagnosis not present

## 2015-04-06 DIAGNOSIS — G8929 Other chronic pain: Secondary | ICD-10-CM | POA: Insufficient documentation

## 2015-04-06 DIAGNOSIS — Z720 Tobacco use: Secondary | ICD-10-CM | POA: Insufficient documentation

## 2015-04-06 DIAGNOSIS — M5442 Lumbago with sciatica, left side: Secondary | ICD-10-CM | POA: Diagnosis not present

## 2015-04-06 DIAGNOSIS — Z86018 Personal history of other benign neoplasm: Secondary | ICD-10-CM | POA: Diagnosis not present

## 2015-04-06 DIAGNOSIS — M1990 Unspecified osteoarthritis, unspecified site: Secondary | ICD-10-CM | POA: Diagnosis not present

## 2015-04-06 DIAGNOSIS — M545 Low back pain: Secondary | ICD-10-CM | POA: Diagnosis present

## 2015-04-06 DIAGNOSIS — Z880 Allergy status to penicillin: Secondary | ICD-10-CM | POA: Insufficient documentation

## 2015-04-06 DIAGNOSIS — G40909 Epilepsy, unspecified, not intractable, without status epilepticus: Secondary | ICD-10-CM | POA: Diagnosis not present

## 2015-04-06 DIAGNOSIS — I10 Essential (primary) hypertension: Secondary | ICD-10-CM | POA: Diagnosis not present

## 2015-04-06 MED ORDER — OXYCODONE-ACETAMINOPHEN 5-325 MG PO TABS: 1.0000 | ORAL_TABLET | ORAL | Status: DC | PRN

## 2015-04-06 NOTE — ED Notes (Signed)
Pt reports has chronic back pain and is supposed to see a pain management doctor in [place].  Pt says her appt got cancelled and she wasn't able to see him.  C/O pain in lower back radiating down left leg and swelling to left leg.

## 2015-04-06 NOTE — ED Notes (Signed)
Patient reports chronic lower back pain. States she is to see pain management in [place] but everytime she has an appointment, they cancel it. Patient is ambulatory with cane. No new injury.

## 2015-04-06 NOTE — ED Provider Notes (Signed)
CSN: [id number]     Arrival date & time 04/06/15  0830 History   First MD Initiated Contact with Patient 04/06/15 [phone]     Chief Complaint  Patient presents with  . Back Pain     (Consider location/radiation/quality/duration/timing/severity/associated sxs/prior Treatment) HPI   Judy Cross is a 49 y.o. female who presents to the Emergency Department complaining of worsening of her chronic low back pain.  She reports a history of a herniated disc and back pain for several years. She states the pain has been radiating into her left leg for some time with intermittent numbness and tingling down the left leg . She states her PCP has referred her to pain management, but she has been unable to see them because they have been rescheduling her previous appointments. She states pain is similar to previous. She has tried over-the-counter medications and Robaxin without relief. She is been ambulating with a cane for some time. She denies recent injury, abdominal pain, urine or bowel changes, fever or chills. She states she is now scheduled to see Dr. Jenetta Downer' Alden Hipp on 04/13/15    Past Medical History  Diagnosis Date  . 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 Hypertension   . Seizures ([place])     last seizure was a year ago  . Arthritis   . Irregular bleeding 03/26/2013  . Hot flashes 03/26/2013  . Endometrial polyp 04/28/2013  . Fibroids 04/28/2013  . Seasonal allergies    Past Surgical History  Procedure Laterality Date  . Cesarean section      x 3  . Examination under anesthesia  02/28/2012    Procedure: EXAM UNDER ANESTHESIA;  Surgeon: Donato Heinz, MD;  Location: [facility];  Service: General;  Laterality: N/A;  . Sphincterotomy  02/28/2012    Procedure: SPHINCTEROTOMY;  Surgeon: Donato Heinz, MD;  Location: [facility];  Service: General;  Laterality: N/A;  Lateral Internal Sphincterotomy  . Trigger finger release    . Ganglion cyst excision    . Tubal ligation    . Polypectomy N/A  05/23/2013    Procedure: ENDOMETRIAL POLYP REMOVAL;  Surgeon: Jonnie Kind, MD;  Location: [facility];  Service: Gynecology;  Laterality: N/A;  . Hysteroscopy w/d&c N/A 05/23/2013    Procedure: DILATATION AND CURETTAGE /HYSTEROSCOPY;  Surgeon: Jonnie Kind, MD;  Location: [facility];  Service: Gynecology;  Laterality: N/A;   Family History  Problem Relation Age of Onset  . Hypertension Mother   . Hypertension Father    Social History  Substance Use Topics  . Smoking status: Current Every Day Smoker -- 0.25 packs/day for 20 years    Types: Cigarettes  . Smokeless tobacco: Never Used  . Alcohol Use: Yes     Comment: occasional   OB History    Gravida Para Term Preterm AB TAB SAB Ectopic Multiple Living   4 3   1  1   3      Review of Systems  Constitutional: Negative for fever.  Respiratory: Negative for shortness of breath.   Gastrointestinal: Negative for vomiting, abdominal pain and constipation.  Genitourinary: Negative for dysuria, hematuria, flank pain, decreased urine volume and difficulty urinating.  Musculoskeletal: Positive for back pain. Negative for joint swelling.  Skin: Negative for rash.  Neurological: Negative for weakness and numbness.  All other systems reviewed and are negative.     Allergies  Lisinopril; Tramadol; and Penicillins  Home Medications   Prior to Admission medications   Medication  Sig Start Date End Date Taking? Authorizing Provider  albuterol (PROVENTIL HFA;VENTOLIN HFA) 108 (90 BASE) MCG/ACT inhaler Inhale 4 puffs into the lungs at bedtime.     Historical Provider, MD  cyclobenzaprine (FLEXERIL) 10 MG tablet Take 10 mg by mouth at bedtime as needed for muscle spasms.    Historical Provider, MD  diphenhydramine-acetaminophen (TYLENOL PM) 25-500 MG TABS Take 1 tablet by mouth at bedtime as needed (sleep).     Historical Provider, MD  gabapentin (NEURONTIN) 300 MG capsule Take 300 mg by mouth 2 (two) times daily.     Historical Provider, MD   hydrochlorothiazide (HYDRODIURIL) 25 MG tablet Take 1 tablet (25 mg total) by mouth daily. 02/02/15   Tanna Furry, MD  levETIRAcetam (KEPPRA) 500 MG tablet Take 500 mg by mouth 2 (two) times daily.    Historical Provider, MD  methocarbamol (ROBAXIN) 500 MG tablet Take 1 tablet (500 mg total) by mouth 3 (three) times daily. 03/02/15   Kambry Takacs, PA-C  metoprolol (LOPRESSOR) 50 MG tablet Take 50 mg by mouth 3 (three) times daily.    Historical Provider, MD  oxyCODONE-acetaminophen (PERCOCET) 5-325 MG tablet Take 1-2 tablets by mouth every 4 (four) hours as needed. 03/23/15   Nat Christen, MD  predniSONE (DELTASONE) 10 MG tablet Take 6 tablets day one, 5 tablets day two, 4 tablets day three, 3 tablets day four, 2 tablets day five, then 1 tablet day six Patient not taking: Reported on 03/23/2015 03/11/15   Avital Dancy, PA-C   BP 146/103 mmHg  Pulse 72  Temp(Src) 98.5 F (36.9 C) (Oral)  Resp 18  Ht 5\' 1"  (1.549 m)  Wt 153 lb (69.4 kg)  BMI 28.92 kg/m2  SpO2 100% Physical Exam  Constitutional: She is oriented to person, place, and time. She appears well-developed and well-nourished. No distress.  HENT:  Head: Normocephalic and atraumatic.  Neck: Normal range of motion. Neck supple.  Cardiovascular: Normal rate, regular rhythm, normal heart sounds and intact distal pulses.   No murmur heard. Pulmonary/Chest: Effort normal and breath sounds normal. No respiratory distress.  Abdominal: Soft. She exhibits no distension. There is no tenderness.  Musculoskeletal: She exhibits tenderness. She exhibits no edema.       Lumbar back: She exhibits tenderness and pain. She exhibits normal range of motion, no swelling, no deformity, no laceration and normal pulse.  ttp of the lower lumbar spine and left paraspinal muscles.   DP pulses are brisk and symmetrical.  Distal sensation intact.  Pt has 5/5 strength against resistance of bilateral lower extremities.     Neurological: She is alert and oriented  to person, place, and time. She has normal strength. No sensory deficit. She exhibits normal muscle tone. Coordination and gait normal.  Reflex Scores:      Patellar reflexes are 2+ on the right side and 2+ on the left side.      Achilles reflexes are 2+ on the right side and 2+ on the left side. Skin: Skin is warm and dry. No rash noted.  Nursing note and vitals reviewed.   ED Course  Procedures (including critical care time) Labs Review Labs Reviewed - No data to display  Imaging Review No results found. I have personally reviewed and evaluated these images and lab results as part of my medical decision-making.   EKG Interpretation None      MDM   Final diagnoses:  Left-sided low back pain with left-sided sciatica    Patient is ambulatory, using a  cane at baseline. Vitals stable, mildly hypertensive but denies symptoms other than back pain. Back pain is a chronic condition. She has an appointment with pain management on November 1. She states her appointments have been canceled several times previously and had to be rescheduled. Stable for d/c, rx for perococet # [address] PA-C 04/06/15 1610  Tanna Furry, MD 04/14/15 [phone]

## 2015-04-06 NOTE — Discharge Instructions (Signed)

## 2015-04-15 ENCOUNTER — Ambulatory Visit (HOSPITAL_COMMUNITY)
Admission: RE | Admit: 2015-04-15 | Discharge: 2015-04-15 | Disposition: A | Discharge status: Home / Self Care | Payer: Medicaid Other | Source: Ambulatory Visit | Attending: Internal Medicine | Admitting: Internal Medicine

## 2015-04-15 DIAGNOSIS — Z1231 Encounter for screening mammogram for malignant neoplasm of breast: Secondary | ICD-10-CM | POA: Diagnosis not present

## 2015-04-23 ENCOUNTER — Emergency Department (HOSPITAL_COMMUNITY)
Admission: EM | Admit: 2015-04-23 | Discharge: 2015-04-23 | Disposition: A | Discharge status: Home / Self Care | Payer: Medicaid Other | Attending: Emergency Medicine | Admitting: Emergency Medicine

## 2015-04-23 ENCOUNTER — Encounter (HOSPITAL_COMMUNITY): Payer: Self-pay | Admitting: Emergency Medicine

## 2015-04-23 DIAGNOSIS — Z86018 Personal history of other benign neoplasm: Secondary | ICD-10-CM | POA: Diagnosis not present

## 2015-04-23 DIAGNOSIS — M549 Dorsalgia, unspecified: Secondary | ICD-10-CM

## 2015-04-23 DIAGNOSIS — Z880 Allergy status to penicillin: Secondary | ICD-10-CM | POA: Insufficient documentation

## 2015-04-23 DIAGNOSIS — I10 Essential (primary) hypertension: Secondary | ICD-10-CM | POA: Insufficient documentation

## 2015-04-23 DIAGNOSIS — Z8742 Personal history of other diseases of the female genital tract: Secondary | ICD-10-CM | POA: Insufficient documentation

## 2015-04-23 DIAGNOSIS — Z79899 Other long term (current) drug therapy: Secondary | ICD-10-CM | POA: Diagnosis not present

## 2015-04-23 DIAGNOSIS — G40909 Epilepsy, unspecified, not intractable, without status epilepticus: Secondary | ICD-10-CM | POA: Insufficient documentation

## 2015-04-23 DIAGNOSIS — Z720 Tobacco use: Secondary | ICD-10-CM | POA: Insufficient documentation

## 2015-04-23 DIAGNOSIS — M545 Low back pain: Secondary | ICD-10-CM | POA: Diagnosis present

## 2015-04-23 DIAGNOSIS — G8929 Other chronic pain: Secondary | ICD-10-CM | POA: Insufficient documentation

## 2015-04-23 MED ORDER — NAPROXEN 500 MG PO TABS: 500.0000 mg | ORAL_TABLET | Freq: Two times a day (BID) | ORAL | Status: DC

## 2015-04-23 MED ORDER — CYCLOBENZAPRINE HCL 10 MG PO TABS: 10.0000 mg | ORAL_TABLET | Freq: Two times a day (BID) | ORAL | Status: DC | PRN

## 2015-04-23 NOTE — ED Provider Notes (Signed)
CSN: [id number]     Arrival date & time 04/23/15  1135 History   First MD Initiated Contact with Patient 04/23/15 1156     Chief Complaint  Patient presents with  . Back Pain     (Consider location/radiation/quality/duration/timing/severity/associated sxs/prior Treatment) HPI Comments: Patient with hx of chronic back pain. Unable to see her PM doc until 12/6. Wants refills on her muscle realxers and anitinflammatories. Denies new sxs  Patient is a 49 y.o. female presenting with back pain. The history is provided by the patient and medical records. No language interpreter was used.  Back Pain Location:  Lumbar spine Quality:  Aching Radiates to:  Does not radiate Pain severity:  Severe Pain is:  Worse during the day Duration: CHRONIC FOR YEARS. Timing:  Constant Progression:  Unchanged Chronicity:  Chronic Context: not emotional stress, not falling, not jumping from heights, not lifting heavy objects, not MCA, not MVA, not occupational injury, not pedestrian accident, not physical stress, not recent illness, not recent injury and not twisting   Relieved by:  Nothing Worsened by:  Movement Associated symptoms: no abdominal pain, no abdominal swelling, no bladder incontinence, no bowel incontinence, no chest pain, no dysuria, no fever, no headaches, no leg pain, no numbness, no paresthesias, no pelvic pain, no perianal numbness, no tingling and no weakness     Past Medical History  Diagnosis Date  . 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 Hypertension   . Seizures ([place])     last seizure was a year ago  . Arthritis   . Irregular bleeding 03/26/2013  . Hot flashes 03/26/2013  . Endometrial polyp 04/28/2013  . Fibroids 04/28/2013  . Seasonal allergies    Past Surgical History  Procedure Laterality Date  . Cesarean section      x 3  . Examination under anesthesia  02/28/2012    Procedure: EXAM UNDER ANESTHESIA;  Surgeon: Donato Heinz, MD;  Location: [facility];  Service:  General;  Laterality: N/A;  . Sphincterotomy  02/28/2012    Procedure: SPHINCTEROTOMY;  Surgeon: Donato Heinz, MD;  Location: [facility];  Service: General;  Laterality: N/A;  Lateral Internal Sphincterotomy  . Trigger finger release    . Ganglion cyst excision    . Tubal ligation    . Polypectomy N/A 05/23/2013    Procedure: ENDOMETRIAL POLYP REMOVAL;  Surgeon: Jonnie Kind, MD;  Location: [facility];  Service: Gynecology;  Laterality: N/A;  . Hysteroscopy w/d&c N/A 05/23/2013    Procedure: DILATATION AND CURETTAGE /HYSTEROSCOPY;  Surgeon: Jonnie Kind, MD;  Location: [facility];  Service: Gynecology;  Laterality: N/A;   Family History  Problem Relation Age of Onset  . Hypertension Mother   . Hypertension Father    Social History  Substance Use Topics  . Smoking status: Current Every Day Smoker -- 0.25 packs/day for 20 years    Types: Cigarettes  . Smokeless tobacco: Never Used  . Alcohol Use: Yes     Comment: occasional   OB History    Gravida Para Term Preterm AB TAB SAB Ectopic Multiple Living   4 3   1  1   3      Review of Systems  Constitutional: Negative for fever.  Cardiovascular: Negative for chest pain.  Gastrointestinal: Negative for abdominal pain and bowel incontinence.  Genitourinary: Negative for bladder incontinence, dysuria and pelvic pain.  Musculoskeletal: Positive for back pain.  Neurological: Negative for tingling, weakness, numbness, headaches and paresthesias.  Allergies  Lisinopril; Tramadol; and Penicillins  Home Medications   Prior to Admission medications   Medication Sig Start Date End Date Taking? Authorizing Provider  albuterol (PROVENTIL HFA;VENTOLIN HFA) 108 (90 BASE) MCG/ACT inhaler Inhale 4 puffs into the lungs at bedtime.     Historical Provider, MD  cyclobenzaprine (FLEXERIL) 10 MG tablet Take 10 mg by mouth at bedtime as needed for muscle spasms.    Historical Provider, MD  diphenhydramine-acetaminophen (TYLENOL PM) 25-500 MG TABS  Take 1 tablet by mouth at bedtime as needed (sleep).     Historical Provider, MD  gabapentin (NEURONTIN) 300 MG capsule Take 300 mg by mouth 2 (two) times daily.     Historical Provider, MD  hydrochlorothiazide (HYDRODIURIL) 25 MG tablet Take 1 tablet (25 mg total) by mouth daily. 02/02/15   Tanna Furry, MD  levETIRAcetam (KEPPRA) 500 MG tablet Take 500 mg by mouth 2 (two) times daily.    Historical Provider, MD  methocarbamol (ROBAXIN) 500 MG tablet Take 1 tablet (500 mg total) by mouth 3 (three) times daily. 03/02/15   Tammy Triplett, PA-C  metoprolol (LOPRESSOR) 50 MG tablet Take 50 mg by mouth 3 (three) times daily.    Historical Provider, MD  oxyCODONE-acetaminophen (PERCOCET/ROXICET) 5-325 MG tablet Take 1 tablet by mouth every 4 (four) hours as needed. 04/06/15   Tammy Triplett, PA-C   BP 115/92 mmHg  Pulse 84  Temp(Src) 98.2 F (36.8 C) (Oral)  Resp 16  Ht 5\' 1"  (1.549 m)  Wt 154 lb (69.854 kg)  BMI 29.11 kg/m2  SpO2 100% Physical Exam  Constitutional: She is oriented to person, place, and time. She appears well-developed and well-nourished. No distress.  HENT:  Head: Normocephalic and atraumatic.  Eyes: Conjunctivae are normal. No scleral icterus.  Neck: Normal range of motion.  Cardiovascular: Normal rate, regular rhythm and normal heart sounds.  Exam reveals no gallop and no friction rub.   No murmur heard. Pulmonary/Chest: Effort normal and breath sounds normal. No respiratory distress.  Abdominal: Soft. Bowel sounds are normal. She exhibits no distension and no mass. There is no tenderness. There is no guarding.  Musculoskeletal:       Lumbar back: She exhibits decreased range of motion and tenderness. She exhibits no swelling, no spasm and normal pulse.       Back:  Neurological: She is alert and oriented to person, place, and time.  Reflex Scores:      Patellar reflexes are 2+ on the right side and 2+ on the left side.      Achilles reflexes are 2+ on the right side and  2+ on the left side. Skin: Skin is warm and dry. She is not diaphoretic.  Nursing note and vitals reviewed.   ED Course  Procedures (including critical care time) Labs Review Labs Reviewed - No data to display  Imaging Review No results found. I have personally reviewed and evaluated these images and lab results as part of my medical decision-making.   EKG Interpretation None      MDM   Final diagnoses:  Chronic back pain   Patient with back pain.  No neurological deficits and normal neuro exam.  Patient can walk but states is painful.  No loss of bowel or bladder control.  No concern for cauda equina.  No fever, night sweats, weight loss, h/o cancer, IVDU.  RICE protocol and NSAIDS indicated and discussed with patient.     Margarita Mail, PA-C 04/23/15 1226  Lacretia Leigh, MD 04/24/15  1544 

## 2015-04-23 NOTE — ED Notes (Signed)
Pt c/o of ongoing lower back pain. Pt states PCP cannot prescribe her pain medication until results of her drug screen come back. States she cannot see PCP until 12/6. Pt ambulatory.

## 2015-04-23 NOTE — Discharge Instructions (Signed)
SEEK IMMEDIATE MEDICAL ATTENTION IF: New numbness, tingling, weakness, or problem with the use of your arms or legs.  Severe back pain not relieved with medications.  Change in bowel or bladder control.  Increasing pain in any areas of the body (such as chest or abdominal pain).  Shortness of breath, dizziness or fainting.  Nausea (feeling sick to your stomach), vomiting, fever, or sweats.  Chronic Pain Discharge Instructions  Emergency care providers appreciate that many patients coming to [place] are in severe pain and we wish to address their pain in the safest, most responsible manner.  It is important to recognize however, that the proper treatment of chronic pain differs from that of the pain of injuries and acute illnesses.  Our goal is to provide quality, safe, personalized care and we thank you for giving [place] the opportunity to serve you. The use of narcotics and related agents for chronic pain syndromes may lead to additional physical and psychological problems.  Nearly as many people die from prescription narcotics each year as die from car crashes.  Additionally, this risk is increased if such prescriptions are obtained from a variety of sources.  Therefore, only your primary care physician or a pain management specialist is able to safely treat such syndromes with narcotic medications long-term.    Documentation revealing such prescriptions have been sought from multiple sources may prohibit [place] from providing a refill or different narcotic medication.  Your name may be checked first through the [place].  This database is a record of controlled substance medication prescriptions that the patient has received.  This has been established by [facility] in an effort to eliminate the dangerous, and often life threatening, practice of obtaining multiple prescriptions from different medical providers.   If you have a chronic pain syndrome (i.e. chronic  headaches, recurrent back or neck pain, dental pain, abdominal or pelvis pain without a specific diagnosis, or neuropathic pain such as fibromyalgia) or recurrent visits for the same condition without an acute diagnosis, you may be treated with non-narcotics and other non-addictive medicines.  Allergic reactions or negative side effects that may be reported by a patient to such medications will not typically lead to the use of a narcotic analgesic or other controlled substance as an alternative.   Patients managing chronic pain with a personal physician should have provisions in place for breakthrough pain.  If you are in crisis, you should call your physician.  If your physician directs you to the emergency department, please have the doctor call and speak to our attending physician concerning your care.   When patients come to the Emergency Department (ED) with acute medical conditions in which the Emergency Department physician feels appropriate to prescribe narcotic or sedating pain medication, the physician will prescribe these in very limited quantities.  The amount of these medications will last only until you can see your primary care physician in his/her office.  Any patient who returns to the ED seeking refills should expect only non-narcotic pain medications.   In the event of an acute medical condition exists and the emergency physician feels it is necessary that the patient be given a narcotic or sedating medication -  a responsible adult driver should be present in the room prior to the medication being given by the nurse.   Prescriptions for narcotic or sedating medications that have been lost, stolen or expired will not be refilled in the Emergency Department.    Patients who have  chronic pain may receive non-narcotic prescriptions until seen by their primary care physician.  It is every patients personal responsibility to maintain active prescriptions with his or her primary care  physician or specialist.  Chronic Back Pain  When back pain lasts longer than 3 months, it is called chronic back pain.People with chronic back pain often go through certain periods that are more intense (flare-ups).  CAUSES Chronic back pain can be caused by wear and tear (degeneration) on different structures in your back. These structures include:  The bones of your spine (vertebrae) and the joints surrounding your spinal cord and nerve roots (facets).  The strong, fibrous tissues that connect your vertebrae (ligaments). Degeneration of these structures may result in pressure on your nerves. This can lead to constant pain. HOME CARE INSTRUCTIONS  Avoid bending, heavy lifting, prolonged sitting, and activities which make the problem worse.  Take brief periods of rest throughout the day to reduce your pain. Lying down or standing usually is better than sitting while you are resting.  Take over-the-counter or prescription medicines only as directed by your caregiver. SEEK IMMEDIATE MEDICAL CARE IF:   You have weakness or numbness in one of your legs or feet.  You have trouble controlling your bladder or bowels.  You have nausea, vomiting, abdominal pain, shortness of breath, or fainting.   This information is not intended to replace advice given to you by your health care provider. Make sure you discuss any questions you have with your health care provider.   Document Released: 07/06/2004 Document Revised: 08/21/2011 Document Reviewed: 11/16/2014 Elsevier Interactive Patient Education [employer].

## 2015-05-12 ENCOUNTER — Encounter: Payer: Self-pay | Admitting: *Deleted

## 2015-05-28 ENCOUNTER — Encounter (HOSPITAL_COMMUNITY): Payer: Self-pay | Admitting: Emergency Medicine

## 2015-05-28 ENCOUNTER — Emergency Department (HOSPITAL_COMMUNITY)
Admission: EM | Admit: 2015-05-28 | Discharge: 2015-05-28 | Disposition: A | Discharge status: Home / Self Care | Payer: Medicaid Other | Attending: Emergency Medicine | Admitting: Emergency Medicine

## 2015-05-28 ENCOUNTER — Emergency Department (HOSPITAL_COMMUNITY): Payer: Medicaid Other

## 2015-05-28 DIAGNOSIS — M1990 Unspecified osteoarthritis, unspecified site: Secondary | ICD-10-CM | POA: Diagnosis not present

## 2015-05-28 DIAGNOSIS — G8929 Other chronic pain: Secondary | ICD-10-CM | POA: Diagnosis not present

## 2015-05-28 DIAGNOSIS — F17210 Nicotine dependence, cigarettes, uncomplicated: Secondary | ICD-10-CM | POA: Diagnosis not present

## 2015-05-28 DIAGNOSIS — Z8742 Personal history of other diseases of the female genital tract: Secondary | ICD-10-CM | POA: Insufficient documentation

## 2015-05-28 DIAGNOSIS — Z880 Allergy status to penicillin: Secondary | ICD-10-CM | POA: Diagnosis not present

## 2015-05-28 DIAGNOSIS — Z791 Long term (current) use of non-steroidal anti-inflammatories (NSAID): Secondary | ICD-10-CM | POA: Diagnosis not present

## 2015-05-28 DIAGNOSIS — M25562 Pain in left knee: Secondary | ICD-10-CM

## 2015-05-28 DIAGNOSIS — I10 Essential (primary) hypertension: Secondary | ICD-10-CM | POA: Diagnosis not present

## 2015-05-28 DIAGNOSIS — Z79899 Other long term (current) drug therapy: Secondary | ICD-10-CM | POA: Insufficient documentation

## 2015-05-28 DIAGNOSIS — M25462 Effusion, left knee: Secondary | ICD-10-CM | POA: Insufficient documentation

## 2015-05-28 DIAGNOSIS — Z86018 Personal history of other benign neoplasm: Secondary | ICD-10-CM | POA: Insufficient documentation

## 2015-05-28 DIAGNOSIS — M545 Low back pain: Secondary | ICD-10-CM | POA: Diagnosis not present

## 2015-05-28 DIAGNOSIS — R569 Unspecified convulsions: Secondary | ICD-10-CM | POA: Diagnosis not present

## 2015-05-28 DIAGNOSIS — M549 Dorsalgia, unspecified: Secondary | ICD-10-CM

## 2015-05-28 MED ORDER — CYCLOBENZAPRINE HCL 10 MG PO TABS
10.0000 mg | ORAL_TABLET | Freq: Two times a day (BID) | ORAL | Status: DC | PRN
Start: 2015-05-28 — End: 2015-10-12

## 2015-05-28 MED ORDER — NAPROXEN 500 MG PO TABS
500.0000 mg | ORAL_TABLET | Freq: Two times a day (BID) | ORAL | Status: DC
Start: 2015-05-28 — End: 2015-09-15

## 2015-05-28 NOTE — ED Provider Notes (Signed)
CSN: [id number]     Arrival date & time 05/28/15  1222 History   First MD Initiated Contact with Patient 05/28/15 1304     Chief Complaint  Patient presents with  . Knee Pain     (Consider location/radiation/quality/duration/timing/severity/associated sxs/prior Treatment) Patient is a 49 y.o. female presenting with knee pain. The history is provided by the patient.  Knee Pain Associated symptoms: back pain   Associated symptoms: no fever    patient with complaint of left knee swelling and pain for 3 weeks. Patient last seen in the emergency department mid-November. For complaint of her chronic back pain. Patient also complaining of back pain radiating into her left leg no numbness or weakness to the left foot. No injury to the knee. Patient followed by Dr. Legrand Rams does have a referral to a specialist in [facility] but has not been seen yet.  Past Medical History  Diagnosis Date  . 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 Hypertension   . Seizures ([place])     last seizure was a year ago  . Arthritis   . Irregular bleeding 03/26/2013  . Hot flashes 03/26/2013  . Endometrial polyp 04/28/2013  . Fibroids 04/28/2013  . Seasonal allergies    Past Surgical History  Procedure Laterality Date  . Cesarean section      x 3  . Examination under anesthesia  02/28/2012    Procedure: EXAM UNDER ANESTHESIA;  Surgeon: Donato Heinz, MD;  Location: [facility];  Service: General;  Laterality: N/A;  . Sphincterotomy  02/28/2012    Procedure: SPHINCTEROTOMY;  Surgeon: Donato Heinz, MD;  Location: [facility];  Service: General;  Laterality: N/A;  Lateral Internal Sphincterotomy  . Trigger finger release    . Ganglion cyst excision    . Tubal ligation    . Polypectomy N/A 05/23/2013    Procedure: ENDOMETRIAL POLYP REMOVAL;  Surgeon: Jonnie Kind, MD;  Location: [facility];  Service: Gynecology;  Laterality: N/A;  . Hysteroscopy w/d&c N/A 05/23/2013    Procedure: DILATATION AND CURETTAGE /HYSTEROSCOPY;   Surgeon: Jonnie Kind, MD;  Location: [facility];  Service: Gynecology;  Laterality: N/A;   Family History  Problem Relation Age of Onset  . Hypertension Mother   . Hypertension Father    Social History  Substance Use Topics  . Smoking status: Current Every Day Smoker -- 0.25 packs/day for 20 years    Types: Cigarettes  . Smokeless tobacco: Never Used  . Alcohol Use: Yes     Comment: occasional   OB History    Gravida Para Term Preterm AB TAB SAB Ectopic Multiple Living   4 3   1  1   3      Review of Systems  Constitutional: Negative for fever.  HENT: Negative for congestion.   Eyes: Negative for redness.  Respiratory: Negative for shortness of breath.   Cardiovascular: Negative for chest pain.  Gastrointestinal: Negative for abdominal pain.  Genitourinary: Negative for dysuria.  Musculoskeletal: Positive for back pain.  Skin: Negative for rash.  Neurological: Negative for weakness, numbness and headaches.  Hematological: Does not bruise/bleed easily.  Psychiatric/Behavioral: Negative for confusion.      Allergies  Lisinopril; Tramadol; and Penicillins  Home Medications   Prior to Admission medications   Medication Sig Start Date End Date Taking? Authorizing Provider  albuterol (PROVENTIL HFA;VENTOLIN HFA) 108 (90 BASE) MCG/ACT inhaler Inhale 4 puffs into the lungs at bedtime.    Yes Historical Provider, MD  hydrochlorothiazide (  HYDRODIURIL) 25 MG tablet Take 1 tablet (25 mg total) by mouth daily. 02/02/15  Yes Tanna Furry, MD  ibuprofen (ADVIL,MOTRIN) 800 MG tablet Take 800 mg by mouth every 8 (eight) hours as needed for moderate pain.   Yes Historical Provider, MD  levETIRAcetam (KEPPRA) 500 MG tablet Take 500 mg by mouth 2 (two) times daily.   Yes Historical Provider, MD  metoprolol (LOPRESSOR) 50 MG tablet Take 50 mg by mouth 3 (three) times daily.   Yes Historical Provider, MD  cyclobenzaprine (FLEXERIL) 10 MG tablet Take 1 tablet (10 mg total) by mouth 2 (two)  times daily as needed for muscle spasms. Patient not taking: Reported on 05/28/2015 04/23/15   Margarita Mail, PA-C  cyclobenzaprine (FLEXERIL) 10 MG tablet Take 1 tablet (10 mg total) by mouth 2 (two) times daily as needed for muscle spasms. 05/28/15   Fredia Sorrow, MD  diphenhydramine-acetaminophen (TYLENOL PM) 25-500 MG TABS Take 1 tablet by mouth at bedtime as needed (sleep). Reported on 05/28/2015    Historical Provider, MD  gabapentin (NEURONTIN) 300 MG capsule Take 300 mg by mouth 2 (two) times daily. Reported on 05/28/2015    Historical Provider, MD  methocarbamol (ROBAXIN) 500 MG tablet Take 1 tablet (500 mg total) by mouth 3 (three) times daily. Patient not taking: Reported on 05/28/2015 03/02/15   Tammy Triplett, PA-C  naproxen (NAPROSYN) 500 MG tablet Take 1 tablet (500 mg total) by mouth 2 (two) times daily. Patient not taking: Reported on 05/28/2015 04/23/15   Margarita Mail, PA-C  naproxen (NAPROSYN) 500 MG tablet Take 1 tablet (500 mg total) by mouth 2 (two) times daily. 05/28/15   Fredia Sorrow, MD  oxyCODONE-acetaminophen (PERCOCET/ROXICET) 5-325 MG tablet Take 1 tablet by mouth every 4 (four) hours as needed. Patient not taking: Reported on 05/28/2015 04/06/15   Tammy Triplett, PA-C   BP 174/99 mmHg  Pulse 58  Temp(Src) 98.1 F (36.7 C) (Oral)  Resp 16  SpO2 100% Physical Exam  Constitutional: She is oriented to person, place, and time. She appears well-developed and well-nourished.  HENT:  Head: Normocephalic and atraumatic.  Mouth/Throat: Oropharynx is clear and moist.  Eyes: Conjunctivae and EOM are normal. Pupils are equal, round, and reactive to light.  Neck: Normal range of motion. Neck supple.  Cardiovascular: Normal rate and regular rhythm.   No murmur heard. Pulmonary/Chest: Effort normal and breath sounds normal.  Abdominal: Soft. Bowel sounds are normal. There is no tenderness.  Musculoskeletal: Normal range of motion. She exhibits edema. She exhibits  no tenderness.  Patient with mild tenderness to lateral aspect of the left knee with some mild swelling there. No erythema. Good range of motion. Dorsalis pedis pulses 2+. Movement of the left leg does cause some mild left-sided low back discomfort. Back nontender to palpation.  Neurological: She is alert and oriented to person, place, and time. No cranial nerve deficit. She exhibits normal muscle tone. Coordination normal.  Skin: Skin is warm. No rash noted. No erythema.  Nursing note and vitals reviewed.   ED Course  Procedures (including critical care time) Labs Review Labs Reviewed - No data to display  Imaging Review Dg Knee Complete 4 Views Left  05/28/2015  CLINICAL DATA:  Pain and swelling for 2 weeks EXAM: LEFT KNEE - COMPLETE 4+ VIEW COMPARISON:  March 04, 2015 FINDINGS: Upright frontal, tunnel, lateral, and sunrise patellar images obtained. There is no fracture or dislocation. No joint effusion. Joint spaces appear intact. No erosive change. IMPRESSION: No fracture or effusion.  No appreciable arthropathy. Electronically Signed   By: Lowella Grip III M.D.   On: 05/28/2015 14:04   I have personally reviewed and evaluated these images and lab results as part of my medical decision-making.   EKG Interpretation None      MDM   Final diagnoses:  Chronic back pain  Knee pain, acute, left    Patient with a history of chronic back pain. Patient seen several times for this most recently in the mid November. Patient does have a primary care doctor.  He is referred her to a back specialist in [place] but she does not have an appointment yet. New complaint was left knee pain without any acute injury to the knee. Minimal swelling to the lateral aspect of the left knee x-rays show no bony abnormality. Will treat symptomatically. He has good range of motion no redness or evidence of infection. Dorsalis pedis pulse to that foot is 2+.    Fredia Sorrow, MD 05/28/15  [phone]

## 2015-05-28 NOTE — Discharge Instructions (Signed)
Check back with your primary care doctor about the referral for evaluation of the back pain. X-ray of the left knee without any acute bony problems. Take the medicines as directed.

## 2015-05-28 NOTE — ED Notes (Signed)
Pt still in x-ray

## 2015-05-28 NOTE — ED Notes (Signed)
Patient complaining of swelling to left knee x 3 weeks. States "I have herniated disc in my back and it makes my knee swell."

## 2015-09-15 ENCOUNTER — Other Ambulatory Visit: Payer: Medicaid Other | Admitting: Adult Health

## 2015-09-15 ENCOUNTER — Encounter (HOSPITAL_COMMUNITY): Payer: Self-pay | Admitting: Emergency Medicine

## 2015-09-15 ENCOUNTER — Emergency Department (HOSPITAL_COMMUNITY): Payer: Medicaid Other

## 2015-09-15 ENCOUNTER — Emergency Department (HOSPITAL_COMMUNITY)
Admission: EM | Admit: 2015-09-15 | Discharge: 2015-09-15 | Disposition: A | Discharge status: Home / Self Care | Payer: Medicaid Other | Attending: Emergency Medicine | Admitting: Emergency Medicine

## 2015-09-15 DIAGNOSIS — M1990 Unspecified osteoarthritis, unspecified site: Secondary | ICD-10-CM | POA: Diagnosis not present

## 2015-09-15 DIAGNOSIS — Z79899 Other long term (current) drug therapy: Secondary | ICD-10-CM | POA: Insufficient documentation

## 2015-09-15 DIAGNOSIS — F17210 Nicotine dependence, cigarettes, uncomplicated: Secondary | ICD-10-CM | POA: Insufficient documentation

## 2015-09-15 DIAGNOSIS — I10 Essential (primary) hypertension: Secondary | ICD-10-CM | POA: Insufficient documentation

## 2015-09-15 DIAGNOSIS — M5440 Lumbago with sciatica, unspecified side: Secondary | ICD-10-CM | POA: Insufficient documentation

## 2015-09-15 DIAGNOSIS — M545 Low back pain: Secondary | ICD-10-CM

## 2015-09-15 MED ORDER — GABAPENTIN 300 MG PO CAPS: 300.0000 mg | ORAL_CAPSULE | Freq: Two times a day (BID) | ORAL | Status: DC

## 2015-09-15 MED ORDER — NAPROXEN 500 MG PO TABS: 500.0000 mg | ORAL_TABLET | Freq: Two times a day (BID) | ORAL | Status: DC

## 2015-09-15 MED ORDER — METHOCARBAMOL 500 MG PO TABS
500.0000 mg | ORAL_TABLET | Freq: Three times a day (TID) | ORAL | Status: DC
Start: 2015-09-15 — End: 2016-09-14

## 2015-09-15 NOTE — Discharge Instructions (Signed)

## 2015-09-15 NOTE — ED Provider Notes (Signed)
CSN: [id number]     Arrival date & time 09/15/15  [id number] History   First MD Initiated Contact with Patient 09/15/15 1036     Chief Complaint  Patient presents with  . Back Pain     (Consider location/radiation/quality/duration/timing/severity/associated sxs/prior Treatment) Patient is a 50 y.o. female presenting with back pain. The history is provided by the patient. No language interpreter was used.  Back Pain Location:  Generalized Quality:  Aching Radiates to:  R posterior upper leg Pain severity:  Moderate Onset quality:  Gradual Duration:  5 months Timing:  Constant Progression:  Worsening Context: recent injury   Relieved by:  Nothing Worsened by:  Nothing tried Ineffective treatments:  None tried Associated symptoms: leg pain   Risk factors: no hx of osteoporosis   pt has a history of back pain.  Pt reports she has had pain for years but when she strted work 5 months ago she began having more frequently.  Past Medical History  Diagnosis Date  . 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 Hypertension   . Seizures ([place])     last seizure was a year ago  . Arthritis   . Irregular bleeding 03/26/2013  . Hot flashes 03/26/2013  . Endometrial polyp 04/28/2013  . Fibroids 04/28/2013  . Seasonal allergies    Past Surgical History  Procedure Laterality Date  . Cesarean section      x 3  . Examination under anesthesia  02/28/2012    Procedure: EXAM UNDER ANESTHESIA;  Surgeon: Donato Heinz, MD;  Location: [facility];  Service: General;  Laterality: N/A;  . Sphincterotomy  02/28/2012    Procedure: SPHINCTEROTOMY;  Surgeon: Donato Heinz, MD;  Location: [facility];  Service: General;  Laterality: N/A;  Lateral Internal Sphincterotomy  . Trigger finger release    . Ganglion cyst excision    . Tubal ligation    . Polypectomy N/A 05/23/2013    Procedure: ENDOMETRIAL POLYP REMOVAL;  Surgeon: Jonnie Kind, MD;  Location: [facility];  Service: Gynecology;  Laterality: N/A;  .  Hysteroscopy w/d&c N/A 05/23/2013    Procedure: DILATATION AND CURETTAGE /HYSTEROSCOPY;  Surgeon: Jonnie Kind, MD;  Location: [facility];  Service: Gynecology;  Laterality: N/A;   Family History  Problem Relation Age of Onset  . Hypertension Mother   . Hypertension Father    Social History  Substance Use Topics  . Smoking status: Current Every Day Smoker -- 0.25 packs/day for 20 years    Types: Cigarettes  . Smokeless tobacco: Never Used  . Alcohol Use: Yes     Comment: occasional   OB History    Gravida Para Term Preterm AB TAB SAB Ectopic Multiple Living   4 3   1  1   3      Review of Systems  Musculoskeletal: Positive for back pain.  All other systems reviewed and are negative.     Allergies  Lisinopril; Tramadol; and Penicillins  Home Medications   Prior to Admission medications   Medication Sig Start Date End Date Taking? Authorizing Provider  albuterol (PROVENTIL HFA;VENTOLIN HFA) 108 (90 BASE) MCG/ACT inhaler Inhale 4 puffs into the lungs at bedtime.     Historical Provider, MD  cyclobenzaprine (FLEXERIL) 10 MG tablet Take 1 tablet (10 mg total) by mouth 2 (two) times daily as needed for muscle spasms. Patient not taking: Reported on 05/28/2015 04/23/15   Margarita Mail, PA-C  cyclobenzaprine (FLEXERIL) 10 MG tablet Take 1 tablet (10 mg  total) by mouth 2 (two) times daily as needed for muscle spasms. 05/28/15   Fredia Sorrow, MD  diphenhydramine-acetaminophen (TYLENOL PM) 25-500 MG TABS Take 1 tablet by mouth at bedtime as needed (sleep). Reported on 05/28/2015    Historical Provider, MD  gabapentin (NEURONTIN) 300 MG capsule Take 1 capsule (300 mg total) by mouth 2 (two) times daily. Reported on 05/28/2015 09/15/15   Fransico Meadow, PA-C  hydrochlorothiazide (HYDRODIURIL) 25 MG tablet Take 1 tablet (25 mg total) by mouth daily. 02/02/15   Tanna Furry, MD  ibuprofen (ADVIL,MOTRIN) 800 MG tablet Take 800 mg by mouth every 8 (eight) hours as needed for moderate pain.     Historical Provider, MD  levETIRAcetam (KEPPRA) 500 MG tablet Take 500 mg by mouth 2 (two) times daily.    Historical Provider, MD  methocarbamol (ROBAXIN) 500 MG tablet Take 1 tablet (500 mg total) by mouth 3 (three) times daily. 09/15/15   Fransico Meadow, PA-C  metoprolol (LOPRESSOR) 50 MG tablet Take 50 mg by mouth 3 (three) times daily.    Historical Provider, MD  naproxen (NAPROSYN) 500 MG tablet Take 1 tablet (500 mg total) by mouth 2 (two) times daily. 09/15/15   Fransico Meadow, PA-C  oxyCODONE-acetaminophen (PERCOCET/ROXICET) 5-325 MG tablet Take 1 tablet by mouth every 4 (four) hours as needed. Patient not taking: Reported on 05/28/2015 04/06/15   Tammy Triplett, PA-C   BP 119/80 mmHg  Pulse 70  Temp(Src) 98.7 F (37.1 C) (Oral)  Resp 18  Ht 5\' 1"  (1.549 m)  Wt 72.122 kg  BMI 30.06 kg/m2  SpO2 100% Physical Exam  Constitutional: She is oriented to person, place, and time. She appears well-developed and well-nourished.  HENT:  Head: Normocephalic.  Right Ear: External ear normal.  Left Ear: External ear normal.  Nose: Nose normal.  Mouth/Throat: Oropharynx is clear and moist.  Eyes: EOM are normal. Pupils are equal, round, and reactive to light.  Neck: Normal range of motion.  Cardiovascular: Normal rate.   Pulmonary/Chest: Effort normal and breath sounds normal.  Abdominal: Soft. She exhibits no distension.  Musculoskeletal: Normal range of motion.  Tender ls spine  Neurological: She is alert and oriented to person, place, and time.  Skin: Skin is warm.  Psychiatric: She has a normal mood and affect.  Nursing note and vitals reviewed.   ED Course  Procedures (including critical care time) Labs Review Labs Reviewed - No data to display  Imaging Review Dg Cervical Spine Complete  09/15/2015  CLINICAL DATA:  Cervicalgia with radicular symptoms EXAM: CERVICAL SPINE - COMPLETE 4+ VIEW COMPARISON:  Cervical spine CT September 12, 2010 FINDINGS: Frontal, lateral, open-mouth  odontoid, and bilateral oblique views were obtained. There is no fracture or spondylolisthesis. Prevertebral soft tissues and predental space regions are normal. The disc spaces appear normal. There is no appreciable exit foraminal narrowing on the oblique views. IMPRESSION: No fracture or spondylolisthesis.  No appreciable arthropathy. Electronically Signed   By: Lowella Grip III M.D.   On: 09/15/2015 10:48   I have personally reviewed and evaluated these images and lab results as part of my medical decision-making.   EKG Interpretation None      MDM   Final diagnoses:  Low back pain, unspecified back pain laterality, with sciatica presence unspecified    Meds ordered this encounter  Medications  . gabapentin (NEURONTIN) 300 MG capsule    Sig: Take 1 capsule (300 mg total) by mouth 2 (two) times daily. Reported on  05/28/2015    Dispense:  60 capsule    Refill:  0    Order Specific Question:  Supervising Provider    Answer:  Noemi Chapel [[id number]]  . methocarbamol (ROBAXIN) 500 MG tablet    Sig: Take 1 tablet (500 mg total) by mouth 3 (three) times daily.    Dispense:  21 tablet    Refill:  0    Order Specific Question:  Supervising Provider    Answer:  MILLER, BRIAN [[id number]]  . naproxen (NAPROSYN) 500 MG tablet    Sig: Take 1 tablet (500 mg total) by mouth 2 (two) times daily.    Dispense:  30 tablet    Refill:  0    Order Specific Question:  Supervising Provider    Answer:  Noemi Chapel [[id number]]      Hollace Kinnier [place], PA-C 09/15/15 [place], MD 09/16/15 1721

## 2015-09-15 NOTE — ED Notes (Signed)
Pt has hx of back pain, went back to work 5 months ago and pain is getting worse radiating down left leg

## 2015-09-27 ENCOUNTER — Encounter: Payer: Self-pay | Admitting: Adult Health

## 2015-09-27 ENCOUNTER — Other Ambulatory Visit: Payer: Medicaid Other | Admitting: Adult Health

## 2015-10-12 ENCOUNTER — Ambulatory Visit (INDEPENDENT_AMBULATORY_CARE_PROVIDER_SITE_OTHER): Payer: Medicaid Other | Admitting: Adult Health

## 2015-10-12 ENCOUNTER — Other Ambulatory Visit (HOSPITAL_COMMUNITY)
Admission: RE | Admit: 2015-10-12 | Discharge: 2015-10-12 | Disposition: A | Discharge status: Home / Self Care | Payer: Medicaid Other | Source: Ambulatory Visit | Attending: Adult Health | Admitting: Adult Health

## 2015-10-12 ENCOUNTER — Encounter: Payer: Self-pay | Admitting: Adult Health

## 2015-10-12 VITALS — BP 138/80 | HR 76 | Ht 62.0 in | Wt 157.5 lb

## 2015-10-12 DIAGNOSIS — N63 Unspecified lump in unspecified breast: Secondary | ICD-10-CM

## 2015-10-12 DIAGNOSIS — Z0000 Encounter for general adult medical examination without abnormal findings: Secondary | ICD-10-CM

## 2015-10-12 DIAGNOSIS — Z01419 Encounter for gynecological examination (general) (routine) without abnormal findings: Secondary | ICD-10-CM

## 2015-10-12 DIAGNOSIS — Z113 Encounter for screening for infections with a predominantly sexual mode of transmission: Secondary | ICD-10-CM | POA: Insufficient documentation

## 2015-10-12 DIAGNOSIS — Z1211 Encounter for screening for malignant neoplasm of colon: Secondary | ICD-10-CM

## 2015-10-12 DIAGNOSIS — Z1151 Encounter for screening for human papillomavirus (HPV): Secondary | ICD-10-CM | POA: Insufficient documentation

## 2015-10-12 DIAGNOSIS — Z139 Encounter for screening, unspecified: Secondary | ICD-10-CM

## 2015-10-12 DIAGNOSIS — Z124 Encounter for screening for malignant neoplasm of cervix: Secondary | ICD-10-CM

## 2015-10-12 HISTORY — DX: Unspecified lump in unspecified breast: N63.0

## 2015-10-12 LAB — HEMOCCULT GUIAC POC 1CARD (OFFICE): Fecal Occult Blood, POC: NEGATIVE

## 2015-10-12 NOTE — Patient Instructions (Signed)
Physical in 1 year, pap in 3 if late Mammogram and [place] 5/9 at 1:45 at [facility] Referred to Dr Learta Codding with Dr Legrand Rams

## 2015-10-12 NOTE — Progress Notes (Signed)
Patient ID: Judy Cross, female   DOB: 1966-02-11, 50 y.o.   MRN: [id number] History of Present Illness: Tawsha is a 50 year old black female in for well woman gyn exam and pap.Has burning in right breast.She is working as PCA now.No periods since ablation.  PCP is Dr Legrand Rams.  Current Medications, Allergies, Past Medical History, Past Surgical History, Family History and Social History were reviewed in [employer] record.     Review of Systems: Patient denies any headaches, hearing loss, fatigue, blurred vision, shortness of breath, chest pain, abdominal pain, problems with bowel movements, urination, or intercourse. No joint pain or mood swings.See HPI for positives.    Physical Exam:BP 138/80 mmHg  Pulse 76  Ht 5\' 2"  (1.575 m)  Wt 157 lb 8 oz (71.442 kg)  BMI 28.80 kg/m2 General:  Well developed, well nourished, no acute distress Skin:  Warm and dry Neck:  Midline trachea, normal thyroid, good ROM, no lymphadenopathy Lungs; Clear to auscultation bilaterally Breast:  No dominant palpable mass, retraction, or nipple discharge on left, no retraction or nipple discharge but has pea sized nodule at 9 o'clock that is tender, non mobile Cardiovascular: Regular rate and rhythm Abdomen:  Soft, non tender, no hepatosplenomegaly Pelvic:  External genitalia is normal in appearance, no lesions.  The vagina is normal in appearance. Urethra has no lesions or masses. The cervix is bulbous and smooth, pap with GC/CHL and HPV performed.  Uterus is felt to be normal size, shape, and contour.  No adnexal masses or tenderness noted.Bladder is non tender, no masses felt. Rectal: Good sphincter tone, no polyps, or hemorrhoids felt.  Hemoccult negative. Extremities/musculoskeletal:  No swelling or varicosities noted, no clubbing or cyanosis Psych:  No mood changes, alert and cooperative,seems happy   Impression: Well woman gyn exam and pap Right breast nodule    Plan: Right  diagnostic mammogram and [place] 5/9 at 2 pm at [facility]  Referred to Dr Oneida Alar for colonoscopy  Labs with PCP  Physical in 1 year, pap in 3 if normal

## 2015-10-15 LAB — CYTOLOGY - PAP

## 2015-10-19 ENCOUNTER — Ambulatory Visit (HOSPITAL_COMMUNITY)
Admission: RE | Admit: 2015-10-19 | Discharge: 2015-10-19 | Disposition: A | Discharge status: Home / Self Care | Payer: Medicaid Other | Source: Ambulatory Visit | Attending: Adult Health | Admitting: Adult Health

## 2015-10-19 DIAGNOSIS — N63 Unspecified lump in unspecified breast: Secondary | ICD-10-CM

## 2015-11-24 ENCOUNTER — Telehealth: Payer: Self-pay | Admitting: Orthopaedic Surgery

## 2015-11-24 NOTE — Telephone Encounter (Signed)
Rx done. 

## 2015-11-29 ENCOUNTER — Telehealth: Payer: Self-pay

## 2015-11-29 NOTE — Telephone Encounter (Signed)
Triaged pt for screening colonoscopy.  OV with Neil Crouch, PA on 01/07/2016 at 10:00 AM due to med list.

## 2015-11-29 NOTE — Telephone Encounter (Signed)
Pt has appt on 01/07/2016 due to med list.

## 2015-11-29 NOTE — Telephone Encounter (Signed)
[id number] PATIENT CALLED TO SCHEDULE TCS

## 2016-01-07 ENCOUNTER — Encounter: Payer: Self-pay | Admitting: Gastroenterology

## 2016-01-07 ENCOUNTER — Ambulatory Visit (INDEPENDENT_AMBULATORY_CARE_PROVIDER_SITE_OTHER): Payer: Medicaid Other | Admitting: Gastroenterology

## 2016-01-07 ENCOUNTER — Other Ambulatory Visit: Payer: Self-pay

## 2016-01-07 DIAGNOSIS — Z79899 Other long term (current) drug therapy: Secondary | ICD-10-CM

## 2016-01-07 DIAGNOSIS — Z1211 Encounter for screening for malignant neoplasm of colon: Secondary | ICD-10-CM

## 2016-01-07 NOTE — Patient Instructions (Signed)
1. Colonoscopy in near future. See separate instructions.  

## 2016-01-07 NOTE — Assessment & Plan Note (Signed)
50 year old female presenting for first ever screening colonoscopy. Due to polypharmacy, plan on deep sedation in the OR to ensure adequate sedation.  I have discussed the risks, alternatives, benefits with regards to but not limited to the risk of reaction to medication, bleeding, infection, perforation and the patient is agreeable to proceed. Written consent to be obtained.

## 2016-01-07 NOTE — Progress Notes (Addendum)
REVIEWED-NO ADDITIONAL RECOMMENDATIONS.Marland Kitchen   Primary Care Physician:  Rosita Fire, MD  Primary Gastroenterologist:  Barney Drain, MD   Chief Complaint  Patient presents with  . Colonoscopy    HPI:  Judy Cross is a 50 y.o. female here To schedule first ever colonoscopy. She was brought in for an office visit due to polypharmacy and need to address sedation. Clinically she is doing well. Denies any constipation, diarrhea, melena, rectal bleeding, abdominal pain, weight loss, heartburn, vomiting, dysphagia. No family history of colon cancer.  Current Outpatient Prescriptions  Medication Sig Dispense Refill  . albuterol (PROVENTIL HFA;VENTOLIN HFA) 108 (90 BASE) MCG/ACT inhaler Inhale 4 puffs into the lungs at bedtime.     . cyclobenzaprine (FLEXERIL) 10 MG tablet Take 1 tablet (10 mg total) by mouth 2 (two) times daily as needed for muscle spasms. 30 tablet 0  . gabapentin (NEURONTIN) 300 MG capsule Take 1 capsule (300 mg total) by mouth 2 (two) times daily. Reported on 05/28/2015 (Patient taking differently: Take 300 mg by mouth 4 (four) times daily. Reported on 05/28/2015) 60 capsule 0  . gemfibrozil (LOPID) 600 MG tablet Take 600 mg by mouth 2 (two) times daily before a meal.    . hydrochlorothiazide (HYDRODIURIL) 25 MG tablet Take 1 tablet (25 mg total) by mouth daily. 30 tablet 0  . ibuprofen (ADVIL,MOTRIN) 800 MG tablet Take 800 mg by mouth every 8 (eight) hours as needed for moderate pain.    Marland Kitchen levETIRAcetam (KEPPRA) 500 MG tablet Take 500 mg by mouth 2 (two) times daily.    Marland Kitchen lidocaine (XYLOCAINE) 5 % ointment Apply 1 gram to affected area 4 times a day. 106.32 g 2  . methocarbamol (ROBAXIN) 500 MG tablet Take 1 tablet (500 mg total) by mouth 3 (three) times daily. 21 tablet 0  . metoprolol (LOPRESSOR) 50 MG tablet Take 50 mg by mouth 3 (three) times daily.    . naproxen (NAPROSYN) 500 MG tablet Take 1 tablet (500 mg total) by mouth 2 (two) times daily. 30 tablet 0  . tiZANidine  (ZANAFLEX) 4 MG capsule Take 4 mg by mouth 3 (three) times daily as needed for muscle spasms.     No current facility-administered medications for this visit.     Allergies as of 01/07/2016 - Review Complete 01/07/2016  Allergen Reaction Noted  . Lisinopril  02/02/2015  . Tramadol  03/02/2015  . Penicillins Rash 01/19/2011    Past Medical History:  Diagnosis Date  . Arthritis   . Breast nodule 10/12/2015  . 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 Endometrial polyp 04/28/2013  . Fibroids 04/28/2013  . Hot flashes 03/26/2013  . Hypertension   . Irregular bleeding 03/26/2013  . Seasonal allergies   . Seizures ([place])    last seizure was a year ago    Past Surgical History:  Procedure Laterality Date  . ABLATION    . CESAREAN SECTION     x 3  . EXAMINATION UNDER ANESTHESIA  02/28/2012   Procedure: EXAM UNDER ANESTHESIA;  Surgeon: Donato Heinz, MD;  Location: [facility];  Service: General;  Laterality: N/A;  . GANGLION CYST EXCISION    . HYSTEROSCOPY W/D&C N/A 05/23/2013   Procedure: DILATATION AND CURETTAGE /HYSTEROSCOPY;  Surgeon: Jonnie Kind, MD;  Location: [facility];  Service: Gynecology;  Laterality: N/A;  . POLYPECTOMY N/A 05/23/2013   Procedure: ENDOMETRIAL POLYP REMOVAL;  Surgeon: Jonnie Kind, MD;  Location: [facility];  Service: Gynecology;  Laterality: N/A;  .  SPHINCTEROTOMY  02/28/2012   Procedure: SPHINCTEROTOMY;  Surgeon: Donato Heinz, MD;  Location: [facility];  Service: General;  Laterality: N/A;  Lateral Internal Sphincterotomy  . TRIGGER FINGER RELEASE    . TUBAL LIGATION      Family History  Problem Relation Age of Onset  . Hypertension Mother   . Hypertension Father   . Colon cancer Neg Hx     Social History   Social History  . Marital status: Single    Spouse name: N/A  . Number of children: 3  . Years of education: N/A   Occupational History  . hair sytlist    Social History Main Topics  . Smoking status: Current Every Day Smoker     Packs/day: 0.00    Years: 20.00    Types: Cigarettes  . Smokeless tobacco: Never Used     Comment: smokes 3 cig daily  . Alcohol use Yes     Comment: occasional  . Drug use: No  . Sexual activity: Yes    Birth control/ protection: Surgical     Comment: tubal   Other Topics Concern  . Not on file   Social History Narrative  . No narrative on file      ROS:  General: Negative for anorexia, weight loss, fever, chills, fatigue, weakness. Eyes: Negative for vision changes.  ENT: Negative for hoarseness, difficulty swallowing , nasal congestion. CV: Negative for chest pain, angina, palpitations, dyspnea on exertion, peripheral edema.  Respiratory: Negative for dyspnea at rest, dyspnea on exertion, cough, sputum, wheezing.  GI: See history of present illness. GU:  Negative for dysuria, hematuria, urinary incontinence, urinary frequency, nocturnal urination.  MS: Negative for joint pain, low back pain.  Derm: Negative for rash or itching.  Neuro: Negative for weakness, abnormal sensation, seizure, frequent headaches, memory loss, confusion.  Psych: Negative for anxiety, depression, suicidal ideation, hallucinations.  Endo: Negative for unusual weight change.  Heme: Negative for bruising or bleeding. Allergy: Negative for rash or hives.    Physical Examination:  BP 130/90   Pulse 78   Temp 98.3 F (36.8 C) (Oral)   Ht 5\' 1"  (1.549 m)   Wt 157 lb 3.2 oz (71.3 kg)   BMI 29.70 kg/m    General: Well-nourished, well-developed in no acute distress.  Head: Normocephalic, atraumatic.   Eyes: Conjunctiva pink, no icterus. Mouth: Oropharyngeal mucosa moist and pink , no lesions erythema or exudate. Neck: Supple without thyromegaly, masses, or lymphadenopathy.  Lungs: Clear to auscultation bilaterally.  Heart: Regular rate and rhythm, no murmurs rubs or gallops.  Abdomen: Bowel sounds are normal, nontender, nondistended, no hepatosplenomegaly or masses, no abdominal bruits or     hernia , no rebound or guarding.   Rectal: Not performed Extremities: No lower extremity edema. No clubbing or deformities.  Neuro: Alert and oriented x 4 , grossly normal neurologically.  Skin: Warm and dry, no rash or jaundice.   Psych: Alert and cooperative, normal mood and affect.  Labs: Heme negative 10/2015 Lab Results  Component Value Date   CREATININE 0.90 07/10/2014   BUN 8 07/10/2014   NA 137 07/10/2014   K 3.2 (L) 07/10/2014   CL 108 07/10/2014   CO2 21 07/10/2014   Lab Results  Component Value Date   ALT 21 07/10/2014   AST 25 07/10/2014   ALKPHOS 59 07/10/2014   BILITOT 0.8 07/10/2014   Lab Results  Component Value Date   WBC 5.2 07/10/2014   HGB 13.1 07/10/2014  HCT 38.8 07/10/2014   MCV 93.3 07/10/2014   PLT 261 07/10/2014     Imaging Studies: No results found.

## 2016-01-07 NOTE — Patient Instructions (Addendum)
Judy Cross  01/07/2016     @PREFPERIOPPHARMACY @   Your procedure is scheduled on 01/11/2016.  Report to [facility] at 6:30 A.M.  Call this number if you have problems the morning of surgery:  [phone]   Remember:  Do not eat food or drink liquids after midnight.  Take these medicines the morning of surgery with A SIP OF WATER : FLEXERIL, NEURONTIN, KEPPRA, LORRESSOR AND ROBAXIN.  PLEASE USE OUR ALBUTEROL INHALER BEFORE LEAVING HOME AND BRING IT WITH YOU TO THE HOSPITAL ON THE DAY OF SURGERY.   Do not wear jewelry, make-up or nail polish.  Do not wear lotions, powders, or perfumes.  You may wear deoderant.  Do not shave 48 hours prior to surgery.  Men may shave face and neck.  Do not bring valuables to the hospital.  The [facility] is not responsible for any belongings or valuables.  Contacts, dentures or bridgework may not be worn into surgery.  Leave your suitcase in the car.  After surgery it may be brought to your room.  For patients admitted to the hospital, discharge time will be determined by your treatment team.  Patients discharged the day of surgery will not be allowed to drive home.   Name and phone number of your driver:   FAMILY Special instructions:  N/A  Please read over the following fact sheets that you were given. Care and Recovery After Surgery  Colonoscopy A colonoscopy is an exam to look at the entire large intestine (colon). This exam can help find problems such as tumors, polyps, inflammation, and areas of bleeding. The exam takes about 1 hour.  LET [facility] CARE PROVIDER KNOW ABOUT:   Any allergies you have.  All medicines you are taking, including vitamins, herbs, eye drops, creams, and over-the-counter medicines.  Previous problems you or members of your family have had with the use of anesthetics.  Any blood disorders you have.  Previous surgeries you have had.  Medical conditions you have. RISKS AND COMPLICATIONS  Generally, this is  a safe procedure. However, as with any procedure, complications can occur. Possible complications include:  Bleeding.  Tearing or rupture of the colon wall.  Reaction to medicines given during the exam.  Infection (rare). BEFORE THE PROCEDURE   Ask your health care provider about changing or stopping your regular medicines.  You may be prescribed an oral bowel prep. This involves drinking a large amount of medicated liquid, starting the day before your procedure. The liquid will cause you to have multiple loose stools until your stool is almost clear or light green. This cleans out your colon in preparation for the procedure.  Do not eat or drink anything else once you have started the bowel prep, unless your health care provider tells you it is safe to do so.  Arrange for someone to drive you home after the procedure. PROCEDURE   You will be given medicine to help you relax (sedative).  You will lie on your side with your knees bent.  A long, flexible tube with a light and camera on the end (colonoscope) will be inserted through the rectum and into the colon. The camera sends video back to a computer screen as it moves through the colon. The colonoscope also releases carbon dioxide gas to inflate the colon. This helps your health care provider see the area better.  During the exam, your health care provider may take a small tissue sample (biopsy) to be examined under a  microscope if any abnormalities are found.  The exam is finished when the entire colon has been viewed. AFTER THE PROCEDURE   Do not drive for 24 hours after the exam.  You may have a small amount of blood in your stool.  You may pass moderate amounts of gas and have mild abdominal cramping or bloating. This is caused by the gas used to inflate your colon during the exam.  Ask when your test results will be ready and how you will get your results. Make sure you get your test results.   This information is not  intended to replace advice given to you by your health care provider. Make sure you discuss any questions you have with your health care provider.   Document Released: 05/26/2000 Document Revised: 03/19/2013 Document Reviewed: 02/03/2013 Elsevier Interactive Patient Education 2016 [place] Anesthesia, Adult General anesthesia is a sleep-like state of non-feeling produced by medicines (anesthetics). General anesthesia prevents you from being alert and feeling pain during a medical procedure. Your caregiver may recommend general anesthesia if your procedure: Is long. Is painful or uncomfortable. Would be frightening to see or hear. Requires you to be still. Affects your breathing. Causes significant blood loss. LET YOUR CAREGIVER KNOW ABOUT: Allergies to food or medicine. Medicines taken, including vitamins, herbs, eyedrops, over-the-counter medicines, and creams. Use of steroids (by mouth or creams). Previous problems with anesthetics or numbing medicines, including problems experienced by relatives. History of bleeding problems or blood clots. Previous surgeries and types of anesthetics received. Possibility of pregnancy, if this applies. Use of cigarettes, alcohol, or illegal drugs. Any health condition(s), especially diabetes, sleep apnea, and high blood pressure. RISKS AND COMPLICATIONS General anesthesia rarely causes complications. However, if complications do occur, they can be life threatening. Complications include: A lung infection. A stroke. A heart attack. Waking up during the procedure. When this occurs, the patient may be unable to move and communicate that he or she is awake. The patient may feel severe pain. Older adults and adults with serious medical problems are more likely to have complications than adults who are young and healthy. Some complications can be prevented by answering all of your caregiver's questions thoroughly and by following all  pre-procedure instructions. It is important to tell your caregiver if any of the pre-procedure instructions, especially those related to diet, were not followed. Any food or liquid in the stomach can cause problems when you are under general anesthesia. BEFORE THE PROCEDURE Ask your caregiver if you will have to spend the night at the hospital. If you will not have to spend the night, arrange to have an adult drive you and stay with you for 24 hours. Follow your caregiver's instructions if you are taking dietary supplements or medicines. Your caregiver may tell you to stop taking them or to reduce your dosage. Do not smoke for as long as possible before your procedure. If possible, stop smoking 3-6 weeks before the procedure. Do not take new dietary supplements or medicines within 1 week of your procedure unless your caregiver approves them. Do not eat within 8 hours of your procedure or as directed by your caregiver. Drink only clear liquids, such as water, black coffee (without milk or cream), and fruit juices (without pulp). Do not drink within 3 hours of your procedure or as directed by your caregiver. You may brush your teeth on the morning of the procedure, but make sure to spit out the toothpaste and  water when finished. PROCEDURE  You will receive anesthetics through a mask, through an intravenous (IV) access tube, or through both. A doctor who specializes in anesthesia (anesthesiologist) or a nurse who specializes in anesthesia (nurse anesthetist) or both will stay with you throughout the procedure to make sure you remain unconscious. He or she will also watch your blood pressure, pulse, and oxygen levels to make sure that the anesthetics do not cause any problems. Once you are asleep, a breathing tube or mask may be used to help you breathe. AFTER THE PROCEDURE You will wake up after the procedure is complete. You may be in the room where the procedure was performed or in a recovery area. You  may have a sore throat if a breathing tube was used. You may also feel: Dizzy. Weak. Drowsy. Confused. Nauseous. Cold. These are all normal responses and can be expected to last for up to 24 hours after the procedure is complete. A caregiver will tell you when you are ready to go home. This will usually be when you are fully awake and in stable condition.   This information is not intended to replace advice given to you by your health care provider. Make sure you discuss any questions you have with your health care provider.   Document Released: 09/05/2007 Document Revised: 06/19/2014 Document Reviewed: 09/27/2011 Elsevier Interactive Patient Education [employer].

## 2016-01-10 ENCOUNTER — Encounter (HOSPITAL_COMMUNITY): Payer: Self-pay

## 2016-01-10 ENCOUNTER — Encounter (HOSPITAL_COMMUNITY)
Admission: RE | Admit: 2016-01-10 | Discharge: 2016-01-10 | Disposition: A | Discharge status: Home / Self Care | Payer: Medicaid Other | Source: Ambulatory Visit | Attending: Gastroenterology | Admitting: Gastroenterology

## 2016-01-10 DIAGNOSIS — Z01810 Encounter for preprocedural cardiovascular examination: Secondary | ICD-10-CM | POA: Insufficient documentation

## 2016-01-10 DIAGNOSIS — Z01812 Encounter for preprocedural laboratory examination: Secondary | ICD-10-CM | POA: Insufficient documentation

## 2016-01-10 LAB — CBC WITH DIFFERENTIAL/PLATELET
Basophils Absolute: 0 10*3/uL (ref 0.0–0.1)
Basophils Relative: 0 %
Eosinophils Absolute: 0.2 10*3/uL (ref 0.0–0.7)
Eosinophils Relative: 3 %
HCT: 41.8 % (ref 36.0–46.0)
Hemoglobin: 13.9 g/dL (ref 12.0–15.0)
Lymphocytes Relative: 54 %
Lymphs Abs: 3.7 10*3/uL (ref 0.7–4.0)
MCH: 31.2 pg (ref 26.0–34.0)
MCHC: 33.3 g/dL (ref 30.0–36.0)
MCV: 93.9 fL (ref 78.0–100.0)
Monocytes Absolute: 0.4 10*3/uL (ref 0.1–1.0)
Monocytes Relative: 6 %
Neutro Abs: 2.5 10*3/uL (ref 1.7–7.7)
Neutrophils Relative %: 37 %
Platelets: 273 10*3/uL (ref 150–400)
RBC: 4.45 MIL/uL (ref 3.87–5.11)
RDW: 14.1 % (ref 11.5–15.5)
WBC: 6.9 10*3/uL (ref 4.0–10.5)

## 2016-01-10 LAB — BASIC METABOLIC PANEL
Anion gap: 9 (ref 5–15)
BUN: 16 mg/dL (ref 6–20)
CO2: 26 mmol/L (ref 22–32)
Calcium: 9.6 mg/dL (ref 8.9–10.3)
Chloride: 104 mmol/L (ref 101–111)
Creatinine, Ser: 0.8 mg/dL (ref 0.44–1.00)
GFR calc Af Amer: >60 mL/min (ref >60)
GFR calc non Af Amer: >60 mL/min (ref >60)
Glucose, Bld: 121 mg/dL — ABNORMAL HIGH (ref 65–99)
Potassium: 3.5 mmol/L (ref 3.5–5.1)
Sodium: 139 mmol/L (ref 135–145)

## 2016-01-11 ENCOUNTER — Ambulatory Visit (HOSPITAL_COMMUNITY): Payer: Medicaid Other | Admitting: Anesthesiology

## 2016-01-11 ENCOUNTER — Encounter (HOSPITAL_COMMUNITY)
Admission: RE | Disposition: A | Discharge status: Home / Self Care | Payer: Self-pay | Source: Ambulatory Visit | Attending: Gastroenterology

## 2016-01-11 ENCOUNTER — Encounter (HOSPITAL_COMMUNITY): Payer: Self-pay | Admitting: *Deleted

## 2016-01-11 ENCOUNTER — Ambulatory Visit (HOSPITAL_COMMUNITY)
Admission: RE | Admit: 2016-01-11 | Discharge: 2016-01-11 | Disposition: A | Discharge status: Home / Self Care | Payer: Medicaid Other | Source: Ambulatory Visit | Attending: Gastroenterology | Admitting: Gastroenterology

## 2016-01-11 DIAGNOSIS — G40909 Epilepsy, unspecified, not intractable, without status epilepticus: Secondary | ICD-10-CM | POA: Insufficient documentation

## 2016-01-11 DIAGNOSIS — Z1211 Encounter for screening for malignant neoplasm of colon: Secondary | ICD-10-CM | POA: Insufficient documentation

## 2016-01-11 DIAGNOSIS — Z880 Allergy status to penicillin: Secondary | ICD-10-CM | POA: Insufficient documentation

## 2016-01-11 DIAGNOSIS — F17200 Nicotine dependence, unspecified, uncomplicated: Secondary | ICD-10-CM | POA: Diagnosis not present

## 2016-01-11 DIAGNOSIS — Z79899 Other long term (current) drug therapy: Secondary | ICD-10-CM | POA: Insufficient documentation

## 2016-01-11 DIAGNOSIS — I10 Essential (primary) hypertension: Secondary | ICD-10-CM | POA: Insufficient documentation

## 2016-01-11 DIAGNOSIS — K648 Other hemorrhoids: Secondary | ICD-10-CM | POA: Diagnosis not present

## 2016-01-11 DIAGNOSIS — K5730 Diverticulosis of large intestine without perforation or abscess without bleeding: Secondary | ICD-10-CM | POA: Insufficient documentation

## 2016-01-11 DIAGNOSIS — Z885 Allergy status to narcotic agent status: Secondary | ICD-10-CM | POA: Diagnosis not present

## 2016-01-11 DIAGNOSIS — M1990 Unspecified osteoarthritis, unspecified site: Secondary | ICD-10-CM | POA: Diagnosis not present

## 2016-01-11 HISTORY — PX: COLONOSCOPY WITH PROPOFOL: SHX5780

## 2016-01-11 SURGERY — COLONOSCOPY WITH PROPOFOL
Anesthesia: Monitor Anesthesia Care

## 2016-01-11 MED ORDER — MIDAZOLAM HCL 5 MG/5ML IJ SOLN
INTRAMUSCULAR | Status: DC | PRN
  Administered 2016-01-11 (×2): 1 mg via INTRAVENOUS

## 2016-01-11 MED ORDER — CHLORHEXIDINE GLUCONATE CLOTH 2 % EX PADS: 6.0000 | MEDICATED_PAD | Freq: Once | CUTANEOUS | Status: DC

## 2016-01-11 MED ORDER — STERILE WATER FOR IRRIGATION IR SOLN
Status: DC | PRN
  Administered 2016-01-11: 08:00:00

## 2016-01-11 MED ORDER — PROPOFOL 500 MG/50ML IV EMUL
INTRAVENOUS | Status: DC | PRN
  Administered 2016-01-11: 150 ug/kg/min via INTRAVENOUS

## 2016-01-11 MED ORDER — LACTATED RINGERS IV SOLN
INTRAVENOUS | Status: DC | PRN
  Administered 2016-01-11: 07:00:00 via INTRAVENOUS

## 2016-01-11 MED ORDER — MIDAZOLAM HCL 2 MG/2ML IJ SOLN
INTRAMUSCULAR | Status: AC
  Filled 2016-01-11: qty 2

## 2016-01-11 MED ORDER — LIDOCAINE HCL (PF) 1 % IJ SOLN
INTRAMUSCULAR | Status: AC
  Filled 2016-01-11: qty 5

## 2016-01-11 MED ORDER — PROPOFOL 10 MG/ML IV BOLUS
INTRAVENOUS | Status: DC | PRN
  Administered 2016-01-11 (×3): 10 mg via INTRAVENOUS

## 2016-01-11 MED ORDER — PROPOFOL 10 MG/ML IV BOLUS
INTRAVENOUS | Status: AC
Start: 2016-01-11 — End: 2016-01-11
  Filled 2016-01-11: qty 40

## 2016-01-11 MED ORDER — FENTANYL CITRATE (PF) 100 MCG/2ML IJ SOLN
INTRAMUSCULAR | Status: AC
  Filled 2016-01-11: qty 2

## 2016-01-11 MED ORDER — FENTANYL CITRATE (PF) 100 MCG/2ML IJ SOLN
INTRAMUSCULAR | Status: DC | PRN
  Administered 2016-01-11 (×2): 25 ug via INTRAVENOUS

## 2016-01-11 NOTE — Anesthesia Postprocedure Evaluation (Signed)
Anesthesia Post Note  Patient: Judy Cross  Procedure(s) Performed: Procedure(s) (LRB): COLONOSCOPY WITH PROPOFOL (N/A)  Patient location during evaluation: Short Stay Level of consciousness: awake and alert and oriented Pain management: pain level controlled Vital Signs Assessment: post-procedure vital signs reviewed and stable Respiratory status: spontaneous breathing Cardiovascular status: blood pressure returned to baseline Postop Assessment: no signs of nausea or vomiting Anesthetic complications: no    Last Vitals:  Vitals:   01/11/16 0845 01/11/16 0902  BP: 101/80 (!) 132/93  Pulse:  64  Resp:  18  Temp:  36.4 C    Last Pain:  Vitals:   01/11/16 0902  TempSrc: Oral                 Danial Sisley

## 2016-01-11 NOTE — H&P (Signed)
Primary Care Physician:  Rosita Fire, MD Primary Gastroenterologist:  Dr. Oneida Alar  Pre-Procedure History & Physical: HPI:  Judy Cross is a 50 y.o. female here for [place].  Past Medical History:  Diagnosis Date  . Arthritis   . Breast nodule 10/12/2015  . 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 Endometrial polyp 04/28/2013  . Fibroids 04/28/2013  . Hot flashes 03/26/2013  . Hypertension   . Irregular bleeding 03/26/2013  . Seasonal allergies   . Seizures ([place])    last seizure was 2 years ago; unknown etiology. On Keppra.    Past Surgical History:  Procedure Laterality Date  . ABLATION     with polyp removal.  . CESAREAN SECTION     x 3  . EXAMINATION UNDER ANESTHESIA  02/28/2012   Procedure: EXAM UNDER ANESTHESIA;  Surgeon: Donato Heinz, MD;  Location: [facility];  Service: General;  Laterality: N/A;  . GANGLION CYST EXCISION Left   . HYSTEROSCOPY W/D&C N/A 05/23/2013   Procedure: DILATATION AND CURETTAGE /HYSTEROSCOPY;  Surgeon: Jonnie Kind, MD;  Location: [facility];  Service: Gynecology;  Laterality: N/A;  . POLYPECTOMY N/A 05/23/2013   Procedure: ENDOMETRIAL POLYP REMOVAL;  Surgeon: Jonnie Kind, MD;  Location: [facility];  Service: Gynecology;  Laterality: N/A;  . SPHINCTEROTOMY  02/28/2012   Procedure: SPHINCTEROTOMY;  Surgeon: Donato Heinz, MD;  Location: [facility];  Service: General;  Laterality: N/A;  Lateral Internal Sphincterotomy  . TRIGGER FINGER RELEASE Left    ring finger  . TUBAL LIGATION      Prior to Admission medications   Medication Sig Start Date End Date Taking? Authorizing Provider  albuterol (PROVENTIL HFA;VENTOLIN HFA) 108 (90 BASE) MCG/ACT inhaler Inhale 1-2 puffs into the lungs every 6 (six) hours as needed for wheezing or shortness of breath.    Yes Historical Provider, MD  cyclobenzaprine (FLEXERIL) 10 MG tablet Take 1 tablet (10 mg total) by mouth 2 (two) times daily as needed for muscle spasms. 04/23/15  Yes Margarita Mail, PA-C  diphenhydrAMINE (BENADRYL) 25 MG tablet Take 50 mg by mouth at bedtime as needed for allergies.   Yes Historical Provider, MD  gabapentin (NEURONTIN) 300 MG capsule Take 1 capsule (300 mg total) by mouth 2 (two) times daily. Reported on 05/28/2015 Patient taking differently: Take 300 mg by mouth 4 (four) times daily. Reported on 05/28/2015 09/15/15  Yes Hollace Kinnier Sofia, PA-C  gemfibrozil (LOPID) 600 MG tablet Take 600 mg by mouth 2 (two) times daily before a meal.   Yes Historical Provider, MD  hydrochlorothiazide (HYDRODIURIL) 25 MG tablet Take 1 tablet (25 mg total) by mouth daily. 02/02/15  Yes Tanna Furry, MD  ibuprofen (ADVIL,MOTRIN) 800 MG tablet Take 800 mg by mouth every 8 (eight) hours as needed for moderate pain.   Yes Historical Provider, MD  levETIRAcetam (KEPPRA) 500 MG tablet Take 500 mg by mouth 2 (two) times daily.   Yes Historical Provider, MD  lidocaine (XYLOCAINE) 5 % ointment Apply 1 gram to affected area 4 times a day. 11/24/15  Yes [name], MD  methocarbamol (ROBAXIN) 500 MG tablet Take 1 tablet (500 mg total) by mouth 3 (three) times daily. Patient taking differently: Take 500 mg by mouth every 8 (eight) hours as needed for muscle spasms.  09/15/15  Yes Hollace Kinnier Sofia, PA-C  metoprolol (LOPRESSOR) 50 MG tablet Take 50 mg by mouth 3 (three) times daily.   Yes Historical Provider, MD  naproxen (NAPROSYN) 500  MG tablet Take 1 tablet (500 mg total) by mouth 2 (two) times daily. Patient taking differently: Take 500 mg by mouth 2 (two) times daily as needed for mild pain.  09/15/15  Yes Hollace Kinnier Sofia, PA-C  tiZANidine (ZANAFLEX) 4 MG capsule Take 4 mg by mouth 3 (three) times daily as needed for muscle spasms.   Yes Historical Provider, MD    Allergies as of 01/07/2016 - Review Complete 01/07/2016  Allergen Reaction Noted  . Lisinopril  02/02/2015  . Tramadol  03/02/2015  . Penicillins Rash 01/19/2011    Family History  Problem Relation Age of Onset  .  Hypertension Mother   . Hypertension Father   . Colon cancer Neg Hx     Social History   Social History  . Marital status: Single    Spouse name: N/A  . Number of children: 3  . Years of education: N/A   Occupational History  . hair sytlist    Social History Main Topics  . Smoking status: Current Every Day Smoker    Packs/day: 0.25    Years: 20.00    Types: Cigarettes  . Smokeless tobacco: Never Used     Comment: smokes 4 cig daily  . Alcohol use Yes     Comment: occasional  . Drug use: No  . Sexual activity: Yes    Birth control/ protection: Surgical     Comment: tubal   Other Topics Concern  . Not on file   Social History Narrative  . No narrative on file    Review of Systems: See HPI, otherwise negative ROS   Physical Exam: BP 118/85   Pulse 64   Temp 97.7 F (36.5 C) (Oral)   Resp (!) 21   SpO2 99%  General:   Alert,  pleasant and cooperative in NAD Head:  Normocephalic and atraumatic. Neck:  Supple; Lungs:  Clear throughout to auscultation.    Heart:  Regular rate and rhythm. Abdomen:  Soft, nontender and nondistended. Normal bowel sounds, without guarding, and without rebound.   Neurologic:  Alert and  oriented x4;  grossly normal neurologically.  Impression/Plan:    SCREENING  Plan:  1. TCS TODAY. DISCUSSED PROCEDURE, BENEFITS, & RISKS: < 1% chance of medication reaction, bleeding, perforation, or rupture of spleen/liver.

## 2016-01-11 NOTE — Transfer of Care (Signed)
Immediate Anesthesia Transfer of Care Note  Patient: Judy Cross  Procedure(s) Performed: Procedure(s) with comments: COLONOSCOPY WITH PROPOFOL (N/A) - 800  Patient Location: PACU  Anesthesia Type:MAC  Level of Consciousness: awake  Airway & Oxygen Therapy: Patient Spontanous Breathing and Patient connected to face mask oxygen  Post-op Assessment: Report given to RN  Post vital signs: Reviewed and stable  Last Vitals:  Vitals:   01/11/16 0745 01/11/16 0750  BP: 118/81 109/83  Pulse:    Resp:  17  Temp:      Last Pain:  Vitals:   01/11/16 0645  TempSrc: Oral         Complications: No apparent anesthesia complications

## 2016-01-11 NOTE — Progress Notes (Signed)
cc'ed to pcp °

## 2016-01-11 NOTE — Anesthesia Preprocedure Evaluation (Signed)
Anesthesia Evaluation  Patient identified by MRN, date of birth, ID band Patient awake    Reviewed: Allergy & Precautions, NPO status , Patient's Chart, lab work & pertinent test results  History of Anesthesia Complications Negative for: history of anesthetic complications  Airway Mallampati: II  TM Distance: >3 FB Neck ROM: Full    Dental  (+) Missing,    Pulmonary neg shortness of breath, neg sleep apnea, neg COPD, neg recent URI, Current Smoker,    breath sounds clear to auscultation       Cardiovascular hypertension, Pt. on medications (-) angina(-) Past MI and (-) CHF  Rhythm:Regular     Neuro/Psych Seizures -, Well Controlled,  negative psych ROS   GI/Hepatic negative GI ROS, Neg liver ROS,   Endo/Other  negative endocrine ROS  Renal/GU negative Renal ROS     Musculoskeletal  (+) Arthritis ,   Abdominal   Peds  Hematology negative hematology ROS (+)   Anesthesia Other Findings   Reproductive/Obstetrics                             Anesthesia Physical Anesthesia Plan  ASA: II  Anesthesia Plan: MAC   Post-op Pain Management:    Induction: Intravenous  Airway Management Planned: Nasal Cannula, Natural Airway and Simple Face Mask  Additional Equipment: None  Intra-op Plan:   Post-operative Plan:   Informed Consent: I have reviewed the patients History and Physical, chart, labs and discussed the procedure including the risks, benefits and alternatives for the proposed anesthesia with the patient or authorized representative who has indicated his/her understanding and acceptance.   Dental advisory given  Plan Discussed with: CRNA and Surgeon  Anesthesia Plan Comments:         Anesthesia Quick Evaluation

## 2016-01-11 NOTE — Op Note (Signed)
[facility] Patient Name: [facility] Procedure Date: 01/11/2016 7:59 AM MRN: [id number] Date of Birth: Jan 27, 1966 Attending MD: Barney Drain , MD CSN: [id number] Age: 50 Admit Type: Outpatient Procedure:                Colonoscopym SCREENING Indications:              Screening for colorectal malignant neoplasm Providers:                Barney Drain, MD, Rosina Lowenstein, RN, Purcell Nails.                            Tina Griffiths, Technician Referring MD:             Rosita Fire Medicines:                Propofol per Anesthesia Complications:            No immediate complications. Estimated Blood Loss:     Estimated blood loss: none. Procedure:                Pre-Anesthesia Assessment:                           - Prior to the procedure, a History and Physical                            was performed, and patient medications and                            allergies were reviewed. The patient's tolerance of                            previous anesthesia was also reviewed. The risks                            and benefits of the procedure and the sedation                            options and risks were discussed with the patient.                            All questions were answered, and informed consent                            was obtained. Prior Anticoagulants: The patient has                            taken ibuprofen, last dose was day of procedure.                            ASA Grade Assessment: II - A patient with mild                            systemic disease. After reviewing the risks and  benefits, the patient was deemed in satisfactory                            condition to undergo the procedure. After obtaining                            informed consent, the colonoscope was passed under                            direct vision. Throughout the procedure, the                            patient's blood pressure, pulse, and oxygen         saturations were monitored continuously. The                            [id number] scope was introduced through                            the anus and advanced to the the cecum, identified                            by appendiceal orifice and ileocecal valve. The                            colonoscopy was performed without difficulty. The                            patient tolerated the procedure well. The ileocecal                            valve, appendiceal orifice, and rectum were                            photographed. The quality of the bowel preparation                            was excellent. Scope In: 8:07:25 AM Scope Out: 8:16:50 AM Scope Withdrawal Time: 0 hours 7 minutes 12 seconds  Total Procedure Duration: 0 hours 9 minutes 25 seconds  Findings:      A few large-mouthed diverticula were found in the proximal ascending       colon.      The exam was otherwise without abnormality.      Non-bleeding internal hemorrhoids were found. The hemorrhoids were small. Impression:               - MILD Diverticulosis in the proximal ascending                            colon.                           - Non-bleeding internal hemorrhoids. Moderate Sedation:      Per Anesthesia Care Recommendation:           - High fiber  diet.                           - Continue present medications.                           - Repeat colonoscopy in 10 years for surveillance.                           -DRINK WATER. LOSE TEN POUNDS.                           - Patient has a contact number available for                            emergencies. The signs and symptoms of potential                            delayed complications were discussed with the                            patient. Return to normal activities tomorrow.                            Written discharge instructions were provided to the                            patient. Procedure Code(s):        --- Professional ---                            [phone], Colonoscopy, flexible; diagnostic, including                            collection of specimen(s) by brushing or washing,                            when performed (separate procedure) Diagnosis Code(s):        --- Professional ---                           Z12.11, Encounter for screening for malignant                            neoplasm of colon                           K64.8, Other hemorrhoids                           K57.30, Diverticulosis of large intestine without                            perforation or abscess without bleeding CPT copyright 2016 American [employer] Association. All rights reserved. The codes documented in this report are preliminary and upon coder review may  be revised to meet current compliance requirements. Barney Drain,  MD Barney Drain, MD 01/11/2016 10:53:32 AM This report has been signed electronically. Number of Addenda: 0

## 2016-01-11 NOTE — Discharge Instructions (Signed)
YOU DID NOT HAVE POLYPS. You have small internal AND MODERATE EXTERNAL HEMORRHOIDS. YOU HAVE diverticulosis IN YOUR RIGHT COLON.   CONTINUE YOUR WEIGHT LOSS EFFORTS. LOSE TEN POUNDS.  DRINK WATER TO KEEP YOUR URINE LIGHT YELLOW.  FOLLOW A HIGH FIBER DIET. AVOID ITEMS THAT CAUSE BLOATING & GAS. See info below.  Next colonoscopy in 10 years.  Colonoscopy Care After Read the instructions outlined below and refer to this sheet in the next week. These discharge instructions provide you with general information on caring for yourself after you leave the hospital. While your treatment has been planned according to the most current medical practices available, unavoidable complications occasionally occur. If you have any problems or questions after discharge, call DR. Adonai Helzer, [phone].  ACTIVITY  You may resume your regular activity, but move at a slower pace for the next 24 hours.   Take frequent rest periods for the next 24 hours.   Walking will help get rid of the air and reduce the bloated feeling in your belly (abdomen).   No driving for 24 hours (because of the medicine (anesthesia) used during the test).   You may shower.   Do not sign any important legal documents or operate any machinery for 24 hours (because of the anesthesia used during the test).    NUTRITION  Drink plenty of fluids.   You may resume your normal diet as instructed by your doctor.   Begin with a light meal and progress to your normal diet. Heavy or fried foods are harder to digest and may make you feel sick to your stomach (nauseated).   Avoid alcoholic beverages for 24 hours or as instructed.    MEDICATIONS  You may resume your normal medications.   WHAT YOU CAN EXPECT TODAY  Some feelings of bloating in the abdomen.   Passage of more gas than usual.   Spotting of blood in your stool or on the toilet paper  .  IF YOU HAD POLYPS REMOVED DURING THE COLONOSCOPY:  Eat a soft diet IF YOU  HAVE NAUSEA, BLOATING, ABDOMINAL PAIN, OR VOMITING.    FINDING OUT THE RESULTS OF YOUR TEST Not all test results are available during your visit. DR. Oneida Alar WILL CALL YOU WITHIN 7 DAYS OF YOUR PROCEDUE WITH YOUR RESULTS. Do not assume everything is normal if you have not heard from DR. Aser Nylund IN ONE WEEK, CALL HER OFFICE AT [phone].  SEEK IMMEDIATE MEDICAL ATTENTION AND CALL THE OFFICE: [phone] IF:  You have more than a spotting of blood in your stool.   Your belly is swollen (abdominal distention).   You are nauseated or vomiting.   You have a temperature over 101F.   You have abdominal pain or discomfort that is severe or gets worse throughout the day.  High-Fiber Diet A high-fiber diet changes your normal diet to include more whole grains, legumes, fruits, and vegetables. Changes in the diet involve replacing refined carbohydrates with unrefined foods. The calorie level of the diet is essentially unchanged. The Dietary Reference Intake (recommended amount) for adult males is 38 grams per day. For adult females, it is 25 grams per day. Pregnant and lactating women should consume 28 grams of fiber per day. Fiber is the intact part of a plant that is not broken down during digestion. Functional fiber is fiber that has been isolated from the plant to provide a beneficial effect in the body. PURPOSE  Increase stool bulk.   Ease and regulate bowel movements.   Lower  cholesterol.   REDUCE RISK OF COLON CANCER  INDICATIONS THAT YOU NEED MORE FIBER  Constipation and hemorrhoids.   Uncomplicated diverticulosis (intestine condition) and irritable bowel syndrome.   Weight management.   As a protective measure against hardening of the arteries (atherosclerosis), diabetes, and cancer.   GUIDELINES FOR INCREASING FIBER IN THE DIET  Start adding fiber to the diet slowly. A gradual increase of about 5 more grams (2 slices of whole-wheat bread, 2 servings of most fruits or  vegetables, or 1 bowl of high-fiber cereal) per day is best. Too rapid an increase in fiber may result in constipation, flatulence, and bloating.   Drink enough water and fluids to keep your urine clear or pale yellow. Water, juice, or caffeine-free drinks are recommended. Not drinking enough fluid may cause constipation.   Eat a variety of high-fiber foods rather than one type of fiber.   Try to increase your intake of fiber through using high-fiber foods rather than fiber pills or supplements that contain small amounts of fiber.   The goal is to change the types of food eaten. Do not supplement your present diet with high-fiber foods, but replace foods in your present diet.   INCLUDE A VARIETY OF FIBER SOURCES  Replace refined and processed grains with whole grains, canned fruits with fresh fruits, and incorporate other fiber sources. White rice, white breads, and most bakery goods contain little or no fiber.   Brown whole-grain rice, buckwheat oats, and many fruits and vegetables are all good sources of fiber. These include: broccoli, Brussels sprouts, cabbage, cauliflower, beets, sweet potatoes, white potatoes (skin on), carrots, tomatoes, eggplant, squash, berries, fresh fruits, and dried fruits.   Cereals appear to be the richest source of fiber. Cereal fiber is found in whole grains and bran. Bran is the fiber-rich outer coat of cereal grain, which is largely removed in refining. In whole-grain cereals, the bran remains. In breakfast cereals, the largest amount of fiber is found in those with "bran" in their names. The fiber content is sometimes indicated on the label.   You may need to include additional fruits and vegetables each day.   In baking, for 1 cup white flour, you may use the following substitutions:   1 cup whole-wheat flour minus 2 tablespoons.   1/2 cup white flour plus 1/2 cup whole-wheat flour.   Diverticulosis Diverticulosis is a common condition that develops when  small pouches (diverticula) form in the wall of the colon. The risk of diverticulosis increases with age. It happens more often in people who eat a low-fiber diet. Most individuals with diverticulosis have no symptoms. Those individuals with symptoms usually experience belly (abdominal) pain, constipation, or loose stools (diarrhea).  HOME CARE INSTRUCTIONS  Increase the amount of fiber in your diet as directed by your caregiver or dietician. This may reduce symptoms of diverticulosis.   Drink at least 6 to 8 glasses of water each day to prevent constipation.   Try not to strain when you have a bowel movement.   Avoiding nuts and seeds to prevent complications is NOT NECESSARY.    FOODS HAVING HIGH FIBER CONTENT INCLUDE:  Fruits. Apple, peach, pear, tangerine, raisins, prunes.   Vegetables. Brussels sprouts, asparagus, broccoli, cabbage, carrot, cauliflower, romaine lettuce, spinach, summer squash, tomato, winter squash, zucchini.   Starchy Vegetables. Baked beans, kidney beans, lima beans, split peas, lentils, potatoes (with skin).   Grains. Whole wheat bread, brown rice, bran flake cereal, plain oatmeal, white rice, shredded wheat, bran muffins.  SEEK IMMEDIATE MEDICAL CARE IF:  You develop increasing pain or severe bloating.   You have an oral temperature above 101F.   You develop vomiting or bowel movements that are bloody or black.   Hemorrhoids Hemorrhoids are dilated (enlarged) veins around the rectum. Sometimes clots will form in the veins. This makes them swollen and painful. These are called thrombosed hemorrhoids. Causes of hemorrhoids include:  Constipation.   Straining to have a bowel movement.   HEAVY LIFTING  HOME CARE INSTRUCTIONS  Eat a well balanced diet and drink 6 to 8 glasses of water every day to avoid constipation. You may also use a bulk laxative.   Avoid straining to have bowel movements.   Keep anal area dry and clean.   Do not use a donut  shaped pillow or sit on the toilet for long periods. This increases blood pooling and pain.   Move your bowels when your body has the urge; this will require less straining and will decrease pain and pressure.

## 2016-01-14 ENCOUNTER — Encounter (HOSPITAL_COMMUNITY): Payer: Self-pay | Admitting: Gastroenterology

## 2016-03-14 ENCOUNTER — Other Ambulatory Visit (HOSPITAL_COMMUNITY): Payer: Self-pay | Admitting: Internal Medicine

## 2016-03-14 DIAGNOSIS — Z1231 Encounter for screening mammogram for malignant neoplasm of breast: Secondary | ICD-10-CM

## 2016-04-17 ENCOUNTER — Ambulatory Visit (HOSPITAL_COMMUNITY)
Admission: RE | Admit: 2016-04-17 | Discharge: 2016-04-17 | Disposition: A | Discharge status: Home / Self Care | Payer: Medicaid Other | Source: Ambulatory Visit | Attending: Internal Medicine | Admitting: Internal Medicine

## 2016-04-17 DIAGNOSIS — Z1231 Encounter for screening mammogram for malignant neoplasm of breast: Secondary | ICD-10-CM | POA: Insufficient documentation

## 2016-04-19 ENCOUNTER — Other Ambulatory Visit: Payer: Self-pay | Admitting: Internal Medicine

## 2016-04-19 DIAGNOSIS — R928 Other abnormal and inconclusive findings on diagnostic imaging of breast: Secondary | ICD-10-CM

## 2016-05-02 ENCOUNTER — Ambulatory Visit (HOSPITAL_COMMUNITY)
Admission: RE | Admit: 2016-05-02 | Discharge: 2016-05-02 | Disposition: A | Discharge status: Home / Self Care | Payer: Medicaid Other | Source: Ambulatory Visit | Attending: Internal Medicine | Admitting: Internal Medicine

## 2016-05-02 ENCOUNTER — Other Ambulatory Visit: Payer: Self-pay | Admitting: Internal Medicine

## 2016-05-02 ENCOUNTER — Encounter (HOSPITAL_COMMUNITY): Payer: Medicaid Other

## 2016-05-02 DIAGNOSIS — R928 Other abnormal and inconclusive findings on diagnostic imaging of breast: Secondary | ICD-10-CM

## 2016-05-02 DIAGNOSIS — N6320 Unspecified lump in the left breast, unspecified quadrant: Secondary | ICD-10-CM

## 2016-05-02 DIAGNOSIS — N63 Unspecified lump in unspecified breast: Secondary | ICD-10-CM | POA: Diagnosis present

## 2016-05-02 DIAGNOSIS — N6323 Unspecified lump in the left breast, lower outer quadrant: Secondary | ICD-10-CM | POA: Diagnosis not present

## 2016-05-09 ENCOUNTER — Ambulatory Visit (HOSPITAL_COMMUNITY)
Admission: RE | Admit: 2016-05-09 | Discharge: 2016-05-09 | Disposition: A | Discharge status: Home / Self Care | Payer: Medicaid Other | Source: Ambulatory Visit | Attending: Internal Medicine | Admitting: Internal Medicine

## 2016-05-09 ENCOUNTER — Encounter (HOSPITAL_COMMUNITY): Payer: Self-pay

## 2016-05-09 ENCOUNTER — Other Ambulatory Visit (HOSPITAL_COMMUNITY): Payer: Self-pay | Admitting: Internal Medicine

## 2016-05-09 DIAGNOSIS — N6082 Other benign mammary dysplasias of left breast: Secondary | ICD-10-CM | POA: Diagnosis not present

## 2016-05-09 DIAGNOSIS — N6012 Diffuse cystic mastopathy of left breast: Secondary | ICD-10-CM | POA: Diagnosis not present

## 2016-05-09 DIAGNOSIS — N6320 Unspecified lump in the left breast, unspecified quadrant: Secondary | ICD-10-CM

## 2016-05-09 DIAGNOSIS — N62 Hypertrophy of breast: Secondary | ICD-10-CM | POA: Insufficient documentation

## 2016-05-09 DIAGNOSIS — R928 Other abnormal and inconclusive findings on diagnostic imaging of breast: Secondary | ICD-10-CM | POA: Insufficient documentation

## 2016-05-09 MED ORDER — LIDOCAINE HCL (PF) 1 % IJ SOLN
INTRAMUSCULAR | Status: AC
  Filled 2016-05-09: qty 10

## 2016-08-23 ENCOUNTER — Emergency Department (HOSPITAL_COMMUNITY)
Admission: EM | Admit: 2016-08-23 | Discharge: 2016-08-23 | Disposition: A | Discharge status: Home / Self Care | Payer: Medicaid Other | Attending: Emergency Medicine | Admitting: Emergency Medicine

## 2016-08-23 ENCOUNTER — Encounter (HOSPITAL_COMMUNITY): Payer: Self-pay | Admitting: Emergency Medicine

## 2016-08-23 DIAGNOSIS — I10 Essential (primary) hypertension: Secondary | ICD-10-CM | POA: Diagnosis not present

## 2016-08-23 DIAGNOSIS — H9201 Otalgia, right ear: Secondary | ICD-10-CM | POA: Insufficient documentation

## 2016-08-23 DIAGNOSIS — Z79899 Other long term (current) drug therapy: Secondary | ICD-10-CM | POA: Insufficient documentation

## 2016-08-23 DIAGNOSIS — R51 Headache: Secondary | ICD-10-CM | POA: Insufficient documentation

## 2016-08-23 DIAGNOSIS — R0981 Nasal congestion: Secondary | ICD-10-CM | POA: Diagnosis not present

## 2016-08-23 DIAGNOSIS — J3489 Other specified disorders of nose and nasal sinuses: Secondary | ICD-10-CM | POA: Diagnosis not present

## 2016-08-23 DIAGNOSIS — F17210 Nicotine dependence, cigarettes, uncomplicated: Secondary | ICD-10-CM | POA: Diagnosis not present

## 2016-08-23 MED ORDER — HYDROCODONE-ACETAMINOPHEN 5-325 MG PO TABS: 1.0000 | ORAL_TABLET | ORAL | 0 refills | Status: DC | PRN

## 2016-08-23 MED ORDER — IBUPROFEN 800 MG PO TABS
800.0000 mg | ORAL_TABLET | Freq: Once | ORAL | Status: AC
  Administered 2016-08-23: 800 mg via ORAL
  Filled 2016-08-23: qty 1

## 2016-08-23 MED ORDER — LORATADINE-PSEUDOEPHEDRINE ER 5-120 MG PO TB12: 1.0000 | ORAL_TABLET | Freq: Two times a day (BID) | ORAL | 0 refills | Status: DC

## 2016-08-23 MED ORDER — CLINDAMYCIN HCL 150 MG PO CAPS
300.0000 mg | ORAL_CAPSULE | Freq: Once | ORAL | Status: AC
  Administered 2016-08-23: 300 mg via ORAL
  Filled 2016-08-23: qty 2

## 2016-08-23 MED ORDER — ONDANSETRON HCL 4 MG PO TABS
4.0000 mg | ORAL_TABLET | Freq: Once | ORAL | Status: AC
  Administered 2016-08-23: 4 mg via ORAL
  Filled 2016-08-23: qty 1

## 2016-08-23 MED ORDER — IBUPROFEN 600 MG PO TABS: 600.0000 mg | ORAL_TABLET | Freq: Four times a day (QID) | ORAL | 0 refills | Status: DC

## 2016-08-23 MED ORDER — CLINDAMYCIN HCL 150 MG PO CAPS: ORAL_CAPSULE | ORAL | 0 refills | Status: DC

## 2016-08-23 MED ORDER — DEXAMETHASONE SODIUM PHOSPHATE 4 MG/ML IJ SOLN
8.0000 mg | Freq: Once | INTRAMUSCULAR | Status: AC
  Administered 2016-08-23: 8 mg via INTRAMUSCULAR
  Filled 2016-08-23: qty 2

## 2016-08-23 NOTE — ED Triage Notes (Signed)
Pt reports right ear pain for a week.  Saw Dr. Legrand Rams twice for same and given ear drops and antibiotics, but not helping.

## 2016-08-23 NOTE — ED Provider Notes (Signed)
[facility] DEPT Provider Note   CSN: [id number] Arrival date & time: 08/23/16  1514     History   Chief Complaint Chief Complaint  Patient presents with  . Otalgia    HPI Judy Cross is a 51 y.o. female.  The history is provided by the patient.  Otalgia  This is a Judy problem. The current episode started more than 1 week ago. There is pain in the right ear. The problem occurs hourly. The problem has been gradually worsening. There has been no fever. The pain is moderate. Associated symptoms include headaches, rhinorrhea and cough. Her past medical history does not include chronic ear infection or tympanostomy tube.    Past Medical History:  Diagnosis Date  . Arthritis   . Breast nodule 10/12/2015  . 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 Endometrial polyp 04/28/2013  . Fibroids 04/28/2013  . Hot flashes 03/26/2013  . Hypertension   . Irregular bleeding 03/26/2013  . Seasonal allergies   . 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 On Keppra.    Patient Active Problem List   Diagnosis Date Noted  . Special screening for malignant neoplasms, colon 01/07/2016  . Polypharmacy 01/07/2016  . Breast nodule 10/12/2015  . Contusion, forearm and elbow 04/09/2014  . Low back pain 12/08/2013  . Fibroids 04/28/2013  . Irregular bleeding 03/26/2013  . Hot flashes 03/26/2013  . Enlarged uterus 03/26/2013    Past Surgical History:  Procedure Laterality Date  . ABLATION     with polyp removal.  . CESAREAN SECTION     x 3  . COLONOSCOPY WITH PROPOFOL N/A 01/11/2016   Procedure: COLONOSCOPY WITH PROPOFOL;  Surgeon: Danie Binder, MD;  Location: [facility];  Service: Endoscopy;  Laterality: N/A;  800  . EXAMINATION UNDER ANESTHESIA  02/28/2012   Procedure: EXAM UNDER ANESTHESIA;  Surgeon: Donato Heinz, MD;  Location: [facility] ORS;  Service: General;  Laterality: N/A;  . GANGLION CYST EXCISION Left   . HYSTEROSCOPY W/D&C N/A 05/23/2013   Procedure: DILATATION AND CURETTAGE /HYSTEROSCOPY;  Surgeon: Jonnie Kind, MD;  Location: [facility];  Service: Gynecology;  Laterality: N/A;  . POLYPECTOMY N/A 05/23/2013   Procedure: ENDOMETRIAL POLYP REMOVAL;  Surgeon: Jonnie Kind, MD;  Location: [facility];  Service: Gynecology;  Laterality: N/A;  . SPHINCTEROTOMY  02/28/2012   Procedure: SPHINCTEROTOMY;  Surgeon: Donato Heinz, MD;  Location: [facility];  Service: General;  Laterality: N/A;  Lateral Internal Sphincterotomy  . TRIGGER FINGER RELEASE Left    ring finger  . TUBAL LIGATION      OB History    Gravida Para Term Preterm AB Living   4 3     1 3    SAB TAB Ectopic Multiple Live Births   1       3       Home Medications    Prior to Admission medications   Medication Sig Start Date End Date Taking? Authorizing Provider  albuterol (PROVENTIL HFA;VENTOLIN HFA) 108 (90 BASE) MCG/ACT inhaler Inhale 1-2 puffs into the lungs every 6 (six) hours as needed for wheezing or shortness of breath.     Historical Provider, MD  cyclobenzaprine (FLEXERIL) 10 MG tablet Take 1 tablet (10 mg total) by mouth 2 (two) times daily as needed for muscle spasms. 04/23/15   Margarita Mail, PA-C  diphenhydrAMINE (BENADRYL) 25 MG tablet Take 50 mg by mouth at bedtime as needed for allergies.  Historical Provider, MD  gabapentin (NEURONTIN) 300 MG capsule Take 1 capsule (300 mg total) by mouth 2 (two) times daily. Reported on 05/28/2015 Patient taking differently: Take 300 mg by mouth 4 (four) times daily. Reported on 05/28/2015 09/15/15   Fransico Meadow, PA-C  gemfibrozil (LOPID) 600 MG tablet Take 600 mg by mouth 2 (two) times daily before a meal.    Historical Provider, MD  hydrochlorothiazide (HYDRODIURIL) 25 MG tablet Take 1 tablet (25 mg total) by mouth daily. 02/02/15   Tanna Furry, MD  ibuprofen (ADVIL,MOTRIN) 800 MG tablet Take 800 mg by mouth every 8 (eight) hours as needed for moderate pain.    Historical Provider, MD  levETIRAcetam (KEPPRA) 500 MG  tablet Take 500 mg by mouth 2 (two) times daily.    Historical Provider, MD  lidocaine (XYLOCAINE) 5 % ointment Apply 1 gram to affected area 4 times a day. 11/24/15   [name], MD  methocarbamol (ROBAXIN) 500 MG tablet Take 1 tablet (500 mg total) by mouth 3 (three) times daily. Patient taking differently: Take 500 mg by mouth every 8 (eight) hours as needed for muscle spasms.  09/15/15   Fransico Meadow, PA-C  metoprolol (LOPRESSOR) 50 MG tablet Take 50 mg by mouth 3 (three) times daily.    Historical Provider, MD  naproxen (NAPROSYN) 500 MG tablet Take 1 tablet (500 mg total) by mouth 2 (two) times daily. Patient taking differently: Take 500 mg by mouth 2 (two) times daily as needed for mild pain.  09/15/15   Fransico Meadow, PA-C  tiZANidine (ZANAFLEX) 4 MG capsule Take 4 mg by mouth 3 (three) times daily as needed for muscle spasms.    Historical Provider, MD    Family History Family History  Problem Relation Age of Onset  . Hypertension Mother   . Hypertension Father   . Colon cancer Neg Hx     Social History Social History  Substance Use Topics  . Smoking status: Current Every Day Smoker    Packs/day: 0.25    Years: 20.00    Types: Cigarettes  . Smokeless tobacco: Never Used     Comment: smokes 4 cig daily  . Alcohol use Yes     Comment: occasional     Allergies   Lisinopril; Tramadol; and Penicillins   Review of Systems Review of Systems  HENT: Positive for congestion, ear pain, rhinorrhea and sinus pressure.   Respiratory: Positive for cough.   Neurological: Positive for headaches.  All other systems reviewed and are negative.    Physical Exam Updated Vital Signs BP 135/92 (BP Location: Left Arm)   Pulse 74   Temp 98.6 F (37 C) (Oral)   Resp 18   Ht 5\' 2"  (1.575 m)   Wt 69.9 kg   SpO2 100%   BMI 28.17 kg/m   Physical Exam  Constitutional: She is oriented to person, place, and time. She appears well-developed and well-nourished.  Non-toxic appearance.   HENT:  Head: Normocephalic.    Right Ear: External ear normal. No drainage. No foreign bodies. No mastoid tenderness. Tympanic membrane is scarred. Tympanic membrane is not perforated and not bulging.  Left Ear: Tympanic membrane and external ear normal. No drainage. No foreign bodies. No mastoid tenderness. Tympanic membrane is not perforated and not bulging.  No mastoid redness or swelling. Nasal congestion present.  Eyes: EOM and lids are normal. Pupils are equal, round, and reactive to light.  Neck: Normal range of motion. Neck supple. Carotid bruit  is not present.  Cardiovascular: Normal rate, regular rhythm, normal heart sounds, intact distal pulses and normal pulses.   Pulmonary/Chest: Breath sounds normal. No respiratory distress.  Abdominal: Soft. Bowel sounds are normal. There is no tenderness. There is no guarding.  Musculoskeletal: Normal range of motion.  Lymphadenopathy:       Head (right side): No submandibular adenopathy present.       Head (left side): No submandibular adenopathy present.    She has no cervical adenopathy.  Neurological: She is alert and oriented to person, place, and time. She has normal strength. No cranial nerve deficit or sensory deficit.  Skin: Skin is warm and dry.  Psychiatric: She has a normal mood and affect. Her speech is normal.  Nursing note and vitals reviewed.    ED Treatments / Results  Labs (all labs ordered are listed, but only abnormal results are displayed) Labs Reviewed - No data to display  EKG  EKG Interpretation None       Radiology No results found.  Procedures Procedures (including critical care time)  Medications Ordered in ED Medications  clindamycin (CLEOCIN) capsule 300 mg (300 mg Oral Given 08/23/16 1634)  dexamethasone (DECADRON) injection 8 mg (8 mg Intramuscular Given 08/23/16 1634)  ibuprofen (ADVIL,MOTRIN) tablet 800 mg (800 mg Oral Given 08/23/16 1634)  ondansetron (ZOFRAN) tablet 4 mg (4 mg Oral  Given 08/23/16 1634)     Initial Impression / Assessment and Plan / ED Course  I have reviewed the triage vital signs and the nursing notes.  Pertinent labs & imaging results that were available during my care of the patient were reviewed by me and considered in my medical decision making (see chart for details).     *I have reviewed nursing notes, vital signs, and all appropriate lab and imaging results for this patient.**  Final Clinical Impressions(s) / ED Diagnoses MDM Vital signs within normal limits. The examination suggest right ear pain. There is nasal congestion present. There is pain in the external auditory canal, and near the area just in front of him just time the lobe of the right ear. There is some fluid behind the drum, but no bulging.  The patient will be treated with clindamycin, Decadron, ibuprofen. The patient is to follow-up with Dr. Benjamine Mola for ear nose and throat evaluation if not improving.    Final diagnoses:  Right ear pain    Judy Prescriptions Discharge Medication List as of 08/23/2016  5:07 PM    START taking these medications   Details  clindamycin (CLEOCIN) 150 MG capsule 2 po bid with food, Print    HYDROcodone-acetaminophen (NORCO/VICODIN) 5-325 MG tablet Take 1 tablet by mouth every 4 (four) hours as needed., Starting Wed 08/23/2016, Print    loratadine-pseudoephedrine (CLARITIN-D 12 HOUR) 5-120 MG tablet Take 1 tablet by mouth 2 (two) times daily., Starting Wed 08/23/2016, Print         Lily Kocher, PA-C 08/24/16 2220    Merrily Pew, MD 08/24/16 2221

## 2016-08-23 NOTE — Discharge Instructions (Signed)
Please use clindamycin 2 times daily with food. Please use ibuprofen with breakfast, lunch, dinner, and at bedtime. Use Claritin-D every 12 hours for congestion. Please use Norco for more severe pain.This medication may cause drowsiness. Please do not drink, drive, or participate in activity that requires concentration while taking this medication.

## 2016-09-14 ENCOUNTER — Emergency Department (HOSPITAL_COMMUNITY)
Admission: EM | Admit: 2016-09-14 | Discharge: 2016-09-14 | Disposition: A | Discharge status: Home / Self Care | Payer: Medicaid Other | Attending: Emergency Medicine | Admitting: Emergency Medicine

## 2016-09-14 ENCOUNTER — Encounter (HOSPITAL_COMMUNITY): Payer: Self-pay | Admitting: Emergency Medicine

## 2016-09-14 ENCOUNTER — Emergency Department (HOSPITAL_COMMUNITY): Payer: Medicaid Other

## 2016-09-14 ENCOUNTER — Ambulatory Visit (INDEPENDENT_AMBULATORY_CARE_PROVIDER_SITE_OTHER): Payer: Medicaid Other | Admitting: Otolaryngology

## 2016-09-14 DIAGNOSIS — I10 Essential (primary) hypertension: Secondary | ICD-10-CM | POA: Insufficient documentation

## 2016-09-14 DIAGNOSIS — H6983 Other specified disorders of Eustachian tube, bilateral: Secondary | ICD-10-CM

## 2016-09-14 DIAGNOSIS — F17210 Nicotine dependence, cigarettes, uncomplicated: Secondary | ICD-10-CM | POA: Insufficient documentation

## 2016-09-14 DIAGNOSIS — Z791 Long term (current) use of non-steroidal anti-inflammatories (NSAID): Secondary | ICD-10-CM | POA: Diagnosis not present

## 2016-09-14 DIAGNOSIS — G8929 Other chronic pain: Secondary | ICD-10-CM | POA: Diagnosis not present

## 2016-09-14 DIAGNOSIS — H9041 Sensorineural hearing loss, unilateral, right ear, with unrestricted hearing on the contralateral side: Secondary | ICD-10-CM | POA: Diagnosis not present

## 2016-09-14 DIAGNOSIS — Z79899 Other long term (current) drug therapy: Secondary | ICD-10-CM | POA: Diagnosis not present

## 2016-09-14 DIAGNOSIS — M5442 Lumbago with sciatica, left side: Secondary | ICD-10-CM | POA: Diagnosis not present

## 2016-09-14 DIAGNOSIS — M545 Low back pain: Secondary | ICD-10-CM | POA: Diagnosis present

## 2016-09-14 DIAGNOSIS — H9201 Otalgia, right ear: Secondary | ICD-10-CM | POA: Diagnosis not present

## 2016-09-14 MED ORDER — PREDNISONE 10 MG PO TABS: ORAL_TABLET | ORAL | 0 refills | Status: DC

## 2016-09-14 MED ORDER — KETOROLAC TROMETHAMINE 60 MG/2ML IM SOLN
60.0000 mg | Freq: Once | INTRAMUSCULAR | Status: AC
  Administered 2016-09-14: 60 mg via INTRAMUSCULAR
  Filled 2016-09-14: qty 2

## 2016-09-14 MED ORDER — DIAZEPAM 5 MG PO TABS
5.0000 mg | ORAL_TABLET | Freq: Once | ORAL | Status: AC
  Administered 2016-09-14: 5 mg via ORAL
  Filled 2016-09-14: qty 1

## 2016-09-14 MED ORDER — CYCLOBENZAPRINE HCL 10 MG PO TABS: 10.0000 mg | ORAL_TABLET | Freq: Three times a day (TID) | ORAL | 0 refills | Status: DC | PRN

## 2016-09-14 NOTE — ED Provider Notes (Signed)
[facility] DEPT Provider Note   CSN: [id number] Arrival date & time: 09/14/16  3662     History   Chief Complaint No chief complaint on file.   HPI Judy Cross is a 51 y.o. female.  HPI   Judy Cross is a 51 y.o. female with hx of chronic low back pain who presents to the Emergency Department complaining of increased back pain and left leg pain for 4 days.  She describes a sharp, stabbing pain to her lower back that radiates into her left buttock and to the level of her left knee and is associated with a "pins and needles" sensation in her left lower leg and foot.  Pain is worse with bending her knee, walking and standing.  Pain improves somewhat with rest.  She has tried OTC pain relievers without relief.  Pain is similar to previous.  She denies recent injury, abdominal pain, constant numbness or weakness of the lower extremities, urine or bowel changes, fever, and chills   Past Medical History:  Diagnosis Date  . Arthritis   . Breast nodule 10/12/2015  . 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 Endometrial polyp 04/28/2013  . Fibroids 04/28/2013  . Hot flashes 03/26/2013  . Hypertension   . Irregular bleeding 03/26/2013  . Seasonal allergies   . Seizures ([place])    last seizure was 2 years ago; unknown etiology. On Keppra.    Patient Active Problem List   Diagnosis Date Noted  . Special screening for malignant neoplasms, colon 01/07/2016  . Polypharmacy 01/07/2016  . Breast nodule 10/12/2015  . Contusion, forearm and elbow 04/09/2014  . Low back pain 12/08/2013  . Fibroids 04/28/2013  . Irregular bleeding 03/26/2013  . Hot flashes 03/26/2013  . Enlarged uterus 03/26/2013    Past Surgical History:  Procedure Laterality Date  . ABLATION     with polyp removal.  . CESAREAN SECTION     x 3  . COLONOSCOPY WITH PROPOFOL N/A 01/11/2016   Procedure: COLONOSCOPY WITH PROPOFOL;  Surgeon: Danie Binder, MD;  [facility]: [facility];  Service: Endoscopy;   Laterality: N/A;  800  . EXAMINATION UNDER ANESTHESIA  02/28/2012   Procedure: EXAM UNDER ANESTHESIA;  Surgeon: Donato Heinz, MD;  Location: [facility] ORS;  Service: General;  Laterality: N/A;  . GANGLION CYST EXCISION Left   . HYSTEROSCOPY W/D&C N/A 05/23/2013   Procedure: DILATATION AND CURETTAGE /HYSTEROSCOPY;  Surgeon: Jonnie Kind, MD;  Location: [facility] ORS;  Service: Gynecology;  Laterality: N/A;  . POLYPECTOMY N/A 05/23/2013   Procedure: ENDOMETRIAL POLYP REMOVAL;  Surgeon: Jonnie Kind, MD;  Location: [facility] ORS;  Service: Gynecology;  Laterality: N/A;  . SPHINCTEROTOMY  02/28/2012   Procedure: SPHINCTEROTOMY;  Surgeon: Donato Heinz, MD;  Location: [facility] ORS;  Service: General;  Laterality: N/A;  Lateral Internal Sphincterotomy  . TRIGGER FINGER RELEASE Left    ring finger  . TUBAL LIGATION      OB History    Gravida Para Term Preterm AB Living   4 3     1 3    SAB TAB Ectopic Multiple Live Births   1       3       Home Medications    Prior to Admission medications   Medication Sig Start Date End Date Taking? Authorizing Provider  albuterol (PROVENTIL HFA;VENTOLIN HFA) 108 (90 BASE) MCG/ACT inhaler Inhale 1-2 puffs into the lungs every 6 (six) hours as needed for wheezing or shortness  of breath.     Historical Provider, MD  clindamycin (CLEOCIN) 150 MG capsule 2 po bid with food 08/23/16   Lily Kocher, PA-C  cyclobenzaprine (FLEXERIL) 10 MG tablet Take 1 tablet (10 mg total) by mouth 2 (two) times daily as needed for muscle spasms. 04/23/15   Margarita Mail, PA-C  diphenhydrAMINE (BENADRYL) 25 MG tablet Take 50 mg by mouth at bedtime as needed for allergies.    Historical Provider, MD  gabapentin (NEURONTIN) 300 MG capsule Take 1 capsule (300 mg total) by mouth 2 (two) times daily. Reported on 05/28/2015 Patient taking differently: Take 300 mg by mouth 4 (four) times daily. Reported on 05/28/2015 09/15/15   Fransico Meadow, PA-C  gemfibrozil (LOPID) 600 MG tablet Take 600 mg by mouth 2  (two) times daily before a meal.    Historical Provider, MD  hydrochlorothiazide (HYDRODIURIL) 25 MG tablet Take 1 tablet (25 mg total) by mouth daily. 02/02/15   Tanna Furry, MD  HYDROcodone-acetaminophen (NORCO/VICODIN) 5-325 MG tablet Take 1 tablet by mouth every 4 (four) hours as needed. 08/23/16   Lily Kocher, PA-C  ibuprofen (ADVIL,MOTRIN) 600 MG tablet Take 1 tablet (600 mg total) by mouth 4 (four) times daily. 08/23/16   Lily Kocher, PA-C  levETIRAcetam (KEPPRA) 500 MG tablet Take 500 mg by mouth 2 (two) times daily.    Historical Provider, MD  lidocaine (XYLOCAINE) 5 % ointment Apply 1 gram to affected area 4 times a day. 11/24/15   [name], MD  loratadine-pseudoephedrine (CLARITIN-D 12 HOUR) 5-120 MG tablet Take 1 tablet by mouth 2 (two) times daily. 08/23/16   Lily Kocher, PA-C  methocarbamol (ROBAXIN) 500 MG tablet Take 1 tablet (500 mg total) by mouth 3 (three) times daily. Patient taking differently: Take 500 mg by mouth every 8 (eight) hours as needed for muscle spasms.  09/15/15   Fransico Meadow, PA-C  metoprolol (LOPRESSOR) 50 MG tablet Take 50 mg by mouth 3 (three) times daily.    Historical Provider, MD  naproxen (NAPROSYN) 500 MG tablet Take 1 tablet (500 mg total) by mouth 2 (two) times daily. Patient taking differently: Take 500 mg by mouth 2 (two) times daily as needed for mild pain.  09/15/15   Fransico Meadow, PA-C  tiZANidine (ZANAFLEX) 4 MG capsule Take 4 mg by mouth 3 (three) times daily as needed for muscle spasms.    Historical Provider, MD    Family History Family History  Problem Relation Age of Onset  . Hypertension Mother   . Hypertension Father   . Colon cancer Neg Hx     Social History Social History  Substance Use Topics  . Smoking status: Current Every Day Smoker    Packs/day: 0.25    Years: 20.00    Types: Cigarettes  . Smokeless tobacco: Never Used     Comment: smokes 4 cig daily  . Alcohol use Yes     Comment: occasional     Allergies     Lisinopril; Tramadol; and Penicillins   Review of Systems Review of Systems  Constitutional: Negative for fever.  Respiratory: Negative for shortness of breath.   Gastrointestinal: Negative for abdominal pain, constipation and vomiting.  Genitourinary: Negative for decreased urine volume, difficulty urinating, dysuria, flank pain and hematuria.  Musculoskeletal: Positive for back pain. Negative for joint swelling.  Skin: Negative for rash.  Neurological: Negative for weakness and numbness.  All other systems reviewed and are negative.    Physical Exam Updated Vital Signs BP Marland Kitchen)  154/105 (BP Location: Left Arm)   Pulse 60   Temp 98.5 F (36.9 C) (Oral)   Resp 18   Ht 5\' 2"  (1.575 m)   Wt 69.9 kg   SpO2 100%   BMI 28.17 kg/m   Physical Exam  Constitutional: She is oriented to person, place, and time. She appears well-developed and well-nourished. No distress.  HENT:  Head: Normocephalic and atraumatic.  Mouth/Throat: Oropharynx is clear and moist.  Neck: Normal range of motion. Neck supple.  Cardiovascular: Normal rate, regular rhythm and intact distal pulses.   No murmur heard. DP pulses are brisk and symmetrical.   Pulmonary/Chest: Effort normal and breath sounds normal. No respiratory distress.  Abdominal: Soft. She exhibits no distension. There is no tenderness.  Musculoskeletal: She exhibits tenderness. She exhibits no edema.       Lumbar back: She exhibits tenderness and pain. She exhibits normal range of motion, no swelling, no deformity, no laceration and normal pulse.  ttp of the upper to mid l spine and left lumbar paraspinal muscles.   Distal sensation intact.  Pt has 5/5 strength against resistance of bilateral lower extremities.     Neurological: She is alert and oriented to person, place, and time. She has normal strength. No sensory deficit. She exhibits normal muscle tone. Coordination and gait normal.  Reflex Scores:      Patellar reflexes are 2+ on the  right side and 2+ on the left side.      Achilles reflexes are 2+ on the right side and 2+ on the left side. Skin: Skin is warm and dry. Capillary refill takes less than 2 seconds. No rash noted.  Psychiatric: She has a normal mood and affect.  Nursing note and vitals reviewed.    ED Treatments / Results  Labs (all labs ordered are listed, but only abnormal results are displayed) Labs Reviewed - No data to display  EKG  EKG Interpretation None       Radiology Dg Lumbar Spine Complete  Result Date: 09/14/2016 CLINICAL DATA:  Patient with history of recurrent chronic back pain. No known injury. Pain radiating down the left lower extremity. EXAM: LUMBAR SPINE - COMPLETE 4+ VIEW COMPARISON:  Myelogram 01/14/2015; CT lumbar spine 01/14/2015. FINDINGS: Normal anatomic alignment. L5-S1 degenerative disc disease. Lower lumbar spine degenerative facet changes. No evidence for acute fracture or dislocation. Preservation of the vertebral body heights. SI joints are unremarkable. Bowel gas pattern is unremarkable. IMPRESSION: Lower lumbar spine degenerative disc and facet disease. Electronically Signed   By: Lovey Newcomer M.D.   On: 09/14/2016 09:51   Dg Knee Complete 4 Views Left  Result Date: 09/14/2016 CLINICAL DATA:  Pain. EXAM: LEFT KNEE - COMPLETE 4+ VIEW COMPARISON:  05/28/2015 FINDINGS: No evidence of fracture, dislocation, or joint effusion. No evidence of arthropathy or other focal bone abnormality. Soft tissues are unremarkable. IMPRESSION: Negative. Electronically Signed   By: Kerby Moors M.D.   On: 09/14/2016 09:51    Procedures Procedures (including critical care time)  Medications Ordered in ED Medications - No data to display   Initial Impression / Assessment and Plan / ED Course  I have reviewed the triage vital signs and the nursing notes.  Pertinent labs & imaging results that were available during my care of the patient were reviewed by me and considered in my medical  decision making (see chart for details).     Pt reviewed on the [place], received #15 hydrocodone 5/325 mg on 08/23/16   On  chart review, pt had MRI of  L spine from 2016 that showed chronic degenerative disc disease with endplate osteophytes and shallow disc protrusion of L4-5 and Involution of a shallow disc protrusion at L5-S1  Pt with likely acute on chronic low back pain.  NV intact, no motor deficits.  Ambulates with a slow, but steady gait.  No focal neuro deficits on exam.  She is feeling better after IM Toradol and po valium.  She appears stable for d/c.  I do not feel that narcotic pain medications are indicated at this time.  She agrees to tx plan of muscle relaxer and prednisone.     Final Clinical Impressions(s) / ED Diagnoses   Final diagnoses:  Chronic left-sided low back pain with left-sided sciatica    New Prescriptions New Prescriptions   No medications on file     Kem Parkinson, PA-C 09/14/16 [place], MD 09/15/16 [phone]

## 2016-09-14 NOTE — ED Triage Notes (Signed)
PT c/o recurrent chronic back pain with no new injury x4 days that radiates down her left leg. PT denies any urinary symptoms.

## 2016-09-14 NOTE — Discharge Instructions (Signed)
Alternate ice and heat to your back.  Call Dr. Josephine Cables office to arrange a follow-up appt in one week if the pain is not improving

## 2016-09-19 ENCOUNTER — Other Ambulatory Visit (INDEPENDENT_AMBULATORY_CARE_PROVIDER_SITE_OTHER): Payer: Self-pay | Admitting: Otolaryngology

## 2016-09-19 DIAGNOSIS — H918X9 Other specified hearing loss, unspecified ear: Secondary | ICD-10-CM

## 2016-09-26 ENCOUNTER — Encounter (HOSPITAL_COMMUNITY): Payer: Self-pay

## 2016-09-26 ENCOUNTER — Ambulatory Visit (HOSPITAL_COMMUNITY): Payer: Medicaid Other

## 2016-10-05 ENCOUNTER — Ambulatory Visit (HOSPITAL_COMMUNITY)
Admission: RE | Admit: 2016-10-05 | Discharge: 2016-10-05 | Disposition: A | Discharge status: Home / Self Care | Payer: Medicaid Other | Source: Ambulatory Visit | Attending: Otolaryngology | Admitting: Otolaryngology

## 2016-10-05 DIAGNOSIS — H918X9 Other specified hearing loss, unspecified ear: Secondary | ICD-10-CM

## 2016-10-05 DIAGNOSIS — R9082 White matter disease, unspecified: Secondary | ICD-10-CM | POA: Diagnosis not present

## 2016-10-05 LAB — POCT I-STAT CREATININE: Creatinine, Ser: 0.8 mg/dL (ref 0.44–1.00)

## 2016-10-05 MED ORDER — GADOBENATE DIMEGLUMINE 529 MG/ML IV SOLN
15.0000 mL | Freq: Once | INTRAVENOUS | Status: AC | PRN
  Administered 2016-10-05: 15 mL via INTRAVENOUS

## 2016-10-26 ENCOUNTER — Ambulatory Visit (INDEPENDENT_AMBULATORY_CARE_PROVIDER_SITE_OTHER): Payer: Medicaid Other | Admitting: Otolaryngology

## 2016-11-13 ENCOUNTER — Telehealth: Payer: Self-pay | Admitting: Family Medicine

## 2016-11-15 ENCOUNTER — Emergency Department (HOSPITAL_COMMUNITY)
Admission: EM | Admit: 2016-11-15 | Discharge: 2016-11-15 | Disposition: A | Discharge status: Home / Self Care | Payer: BLUE CROSS/BLUE SHIELD | Attending: Emergency Medicine | Admitting: Emergency Medicine

## 2016-11-15 ENCOUNTER — Encounter (HOSPITAL_COMMUNITY): Payer: Self-pay | Admitting: *Deleted

## 2016-11-15 DIAGNOSIS — F17210 Nicotine dependence, cigarettes, uncomplicated: Secondary | ICD-10-CM | POA: Insufficient documentation

## 2016-11-15 DIAGNOSIS — I10 Essential (primary) hypertension: Secondary | ICD-10-CM | POA: Insufficient documentation

## 2016-11-15 DIAGNOSIS — M545 Low back pain: Secondary | ICD-10-CM | POA: Diagnosis present

## 2016-11-15 DIAGNOSIS — Z79899 Other long term (current) drug therapy: Secondary | ICD-10-CM | POA: Insufficient documentation

## 2016-11-15 DIAGNOSIS — M5432 Sciatica, left side: Secondary | ICD-10-CM | POA: Diagnosis not present

## 2016-11-15 DIAGNOSIS — Z7951 Long term (current) use of inhaled steroids: Secondary | ICD-10-CM | POA: Insufficient documentation

## 2016-11-15 MED ORDER — CYCLOBENZAPRINE HCL 10 MG PO TABS: 10.0000 mg | ORAL_TABLET | Freq: Three times a day (TID) | ORAL | 0 refills | Status: DC | PRN

## 2016-11-15 MED ORDER — DICLOFENAC SODIUM 75 MG PO TBEC: 75.0000 mg | DELAYED_RELEASE_TABLET | Freq: Two times a day (BID) | ORAL | 0 refills | Status: DC

## 2016-11-15 NOTE — ED Triage Notes (Signed)
Pt comes in with chronic back pain that shoots down her left leg. This episode started over the weekend. Denies any injury.

## 2016-11-15 NOTE — ED Provider Notes (Signed)
[facility] DEPT Provider Note   CSN: [id number] Arrival date & time: 11/15/16  1202     History   Chief Complaint Chief Complaint  Patient presents with  . Back Pain    HPI Judy Cross is a 51 y.o. female.  HPI   Judy Cross is a 51 y.o. female with hx of chronic back pain,  presents to the Emergency Department complaining of left low back pain for 4-5 days.  She states pain is similar to previous episodes and radiates from the left lower back to the left buttocks and to the left thigh. Pain worse with weight bearing, improves with rest. Denies injury.  No abd pain, urine or bowel changes, numbness or weakness of the extremities.     Past Medical History:  Diagnosis Date  . Arthritis   . Breast nodule 10/12/2015  . 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 Endometrial polyp 04/28/2013  . Fibroids 04/28/2013  . Hot flashes 03/26/2013  . Hypertension   . Irregular bleeding 03/26/2013  . Seasonal allergies   . Seizures ([place])    last seizure was 2 years ago; unknown etiology. On Keppra.    Patient Active Problem List   Diagnosis Date Noted  . Special screening for malignant neoplasms, colon 01/07/2016  . Polypharmacy 01/07/2016  . Breast nodule 10/12/2015  . Contusion, forearm and elbow 04/09/2014  . Low back pain 12/08/2013  . Fibroids 04/28/2013  . Irregular bleeding 03/26/2013  . Hot flashes 03/26/2013  . Enlarged uterus 03/26/2013    Past Surgical History:  Procedure Laterality Date  . ABLATION     with polyp removal.  . CESAREAN SECTION     x 3  . COLONOSCOPY WITH PROPOFOL N/A 01/11/2016   Procedure: COLONOSCOPY WITH PROPOFOL;  Surgeon: Danie Binder, MD;  [facility];  Service: Endoscopy;  Laterality: N/A;  800  . EXAMINATION UNDER ANESTHESIA  02/28/2012   Procedure: EXAM UNDER ANESTHESIA;  Surgeon: Donato Heinz, MD;  Location: [facility];  Service: General;  Laterality: N/A;  . GANGLION CYST EXCISION Left   . HYSTEROSCOPY W/D&C N/A  05/23/2013   Procedure: DILATATION AND CURETTAGE /HYSTEROSCOPY;  Surgeon: Jonnie Kind, MD;  Location: [facility];  Service: Gynecology;  Laterality: N/A;  . POLYPECTOMY N/A 05/23/2013   Procedure: ENDOMETRIAL POLYP REMOVAL;  Surgeon: Jonnie Kind, MD;  Location: [facility];  Service: Gynecology;  Laterality: N/A;  . SPHINCTEROTOMY  02/28/2012   Procedure: SPHINCTEROTOMY;  Surgeon: Donato Heinz, MD;  Location: [facility];  Service: General;  Laterality: N/A;  Lateral Internal Sphincterotomy  . TRIGGER FINGER RELEASE Left    ring finger  . TUBAL LIGATION      OB History    Gravida Para Term Preterm AB Living   4 3     1 3    SAB TAB Ectopic Multiple Live Births   1       3       Home Medications    Prior to Admission medications   Medication Sig Start Date End Date Taking? Authorizing Provider  albuterol (PROVENTIL HFA;VENTOLIN HFA) 108 (90 BASE) MCG/ACT inhaler Inhale 1-2 puffs into the lungs every 6 (six) hours as needed for wheezing or shortness of breath.    Yes [provider]  hydrochlorothiazide (HYDRODIURIL) 25 MG tablet Take 1 tablet (25 mg total) by mouth daily. 02/02/15  Yes Tanna Furry, MD  ibuprofen (ADVIL,MOTRIN) 200 MG tablet Take 200 mg by mouth every  6 (six) hours as needed.   Yes [provider]  cyclobenzaprine (FLEXERIL) 10 MG tablet Take 1 tablet (10 mg total) by mouth 3 (three) times daily as needed. Patient not taking: Reported on 11/15/2016 09/14/16   Adhira Jamil, Lynelle Smoke, PA-C  HYDROcodone-acetaminophen (NORCO/VICODIN) 5-325 MG tablet Take 1 tablet by mouth every 4 (four) hours as needed. Patient not taking: Reported on 09/14/2016 08/23/16   Lily Kocher, PA-C  ibuprofen (ADVIL,MOTRIN) 600 MG tablet Take 1 tablet (600 mg total) by mouth 4 (four) times daily. Patient not taking: Reported on 11/15/2016 08/23/16   Lily Kocher, PA-C  loratadine-pseudoephedrine (CLARITIN-D 12 HOUR) 5-120 MG tablet Take 1 tablet by mouth 2 (two) times daily. Patient not taking:  Reported on 09/14/2016 08/23/16   Lily Kocher, PA-C    Family History Family History  Problem Relation Age of Onset  . Hypertension Mother   . Hypertension Father   . Colon cancer Neg Hx     Social History Social History  Substance Use Topics  . Smoking status: Current Some Day Smoker    Packs/day: 0.25    Years: 20.00    Types: Cigarettes  . Smokeless tobacco: Never Used     Comment: smokes 1 cig daily  . Alcohol use Yes     Comment: occasional     Allergies   Lisinopril; Tramadol; and Penicillins   Review of Systems Review of Systems  Constitutional: Negative for fever.  Respiratory: Negative for shortness of breath.   Gastrointestinal: Negative for abdominal pain, constipation and vomiting.  Genitourinary: Negative for decreased urine volume, difficulty urinating, dysuria, flank pain and hematuria.  Musculoskeletal: Positive for back pain. Negative for joint swelling.  Skin: Negative for rash.  Neurological: Negative for weakness and numbness.  All other systems reviewed and are negative.    Physical Exam Updated Vital Signs BP 137/81 (BP Location: Left Arm)   Pulse 74   Temp 98.4 F (36.9 C) (Oral)   Resp 18   Ht 5\' 2"  (1.575 m)   Wt 72.1 kg (159 lb)   SpO2 100%   BMI 29.08 kg/m   Physical Exam  Constitutional: She is oriented to person, place, and time. She appears well-developed and well-nourished. No distress.  HENT:  Head: Normocephalic and atraumatic.  Neck: Normal range of motion. Neck supple.  Cardiovascular: Normal rate, regular rhythm, normal heart sounds and intact distal pulses.   No murmur heard. Pulmonary/Chest: Effort normal and breath sounds normal. No respiratory distress.  Abdominal: Soft. She exhibits no distension. There is no tenderness.  Musculoskeletal: She exhibits tenderness. She exhibits no edema.       Lumbar back: She exhibits tenderness and pain. She exhibits normal range of motion, no swelling, no deformity, no  laceration and normal pulse.  ttp of the left lumbar paraspinal muscles and SI joint.  No spinal tenderness.  DP pulses are brisk and symmetrical.  Distal sensation intact.  Pt has 5/5 strength against resistance of bilateral lower extremities.     Neurological: She is alert and oriented to person, place, and time. She has normal strength. No sensory deficit. She exhibits normal muscle tone. Coordination and gait normal.  Reflex Scores:      Patellar reflexes are 2+ on the right side and 2+ on the left side.      Achilles reflexes are 2+ on the right side and 2+ on the left side. Skin: Skin is warm and dry. No rash noted.  Nursing note and vitals reviewed.  ED Treatments / Results  Labs (all labs ordered are listed, but only abnormal results are displayed) Labs Reviewed - No data to display  EKG  EKG Interpretation None       Radiology No results found.  Procedures Procedures (including critical care time)  Medications Ordered in ED Medications - No data to display   Initial Impression / Assessment and Plan / ED Course  I have reviewed the triage vital signs and the nursing notes.  Pertinent labs & imaging results that were available during my care of the patient were reviewed by me and considered in my medical decision making (see chart for details).     Low back pain with left-sided sciatica that is similar to previous. Several ED visits for same.  No focal neuro deficits on exam. No concerning symptoms for emergent neurological process. Patient agrees to treatment plan with her muscle relaxer and anti-inflammatory. Referral given for  Final Clinical Impressions(s) / ED Diagnoses   Final diagnoses:  Sciatica of left side    New Prescriptions New Prescriptions   No medications on file     Kem Parkinson, Hershal Coria 11/15/16 1445    Milton Ferguson, MD 11/15/16 1540

## 2016-11-15 NOTE — Discharge Instructions (Signed)
Alternate ice and heat to your back.  Follow-up with your primary doctor or with Dr. Ruthe Mannan office in one week if the symptoms are not improving

## 2016-12-07 ENCOUNTER — Ambulatory Visit (INDEPENDENT_AMBULATORY_CARE_PROVIDER_SITE_OTHER): Payer: BLUE CROSS/BLUE SHIELD | Admitting: Family Medicine

## 2016-12-07 ENCOUNTER — Encounter: Payer: Self-pay | Admitting: Family Medicine

## 2016-12-07 VITALS — BP 114/68 | HR 72 | Temp 97.5°F | Resp 18 | Ht 62.0 in | Wt 146.0 lb

## 2016-12-07 DIAGNOSIS — J4520 Mild intermittent asthma, uncomplicated: Secondary | ICD-10-CM | POA: Diagnosis not present

## 2016-12-07 DIAGNOSIS — G8929 Other chronic pain: Secondary | ICD-10-CM | POA: Diagnosis not present

## 2016-12-07 DIAGNOSIS — M5442 Lumbago with sciatica, left side: Secondary | ICD-10-CM | POA: Diagnosis not present

## 2016-12-07 DIAGNOSIS — I10 Essential (primary) hypertension: Secondary | ICD-10-CM

## 2016-12-07 HISTORY — DX: Mild intermittent asthma, uncomplicated: J45.20

## 2016-12-07 MED ORDER — DICLOFENAC SODIUM 75 MG PO TBEC: 75.0000 mg | DELAYED_RELEASE_TABLET | Freq: Two times a day (BID) | ORAL | 1 refills | Status: DC

## 2016-12-07 MED ORDER — HYDROCHLOROTHIAZIDE 25 MG PO TABS: 25.0000 mg | ORAL_TABLET | Freq: Every day | ORAL | 3 refills | Status: DC

## 2016-12-07 MED ORDER — CYCLOBENZAPRINE HCL 10 MG PO TABS: 10.0000 mg | ORAL_TABLET | Freq: Every day | ORAL | 1 refills | Status: DC

## 2016-12-07 NOTE — Progress Notes (Signed)
Chief Complaint  Patient presents with  . Follow-up   Well controlled hypertension Adult asthma with infrequent use of albuterol Smokes - one cigarette a day - not every day.  Not great, but not enough to worry about either She had an ablation , has no menses, but has occas hot flash.  Under care GYN.  Regular visits an d mammograms.  Had her colo at age 51 Shots up to date.  Will get PCP records Recent ER visit for back pain and L sciatica.  Took a week of diclofenac - this helped.  Took occas flexeril - make her sleepy. works as Neurosurgeon and back hurts occasionally.  Is given refills for prn use History of one seizure - had a collapse - not on medicine and never repeated.  Not under care neurology.  No alcohol or drug use.    Patient Active Problem List   Diagnosis Date Noted  . Essential hypertension 12/07/2016  . Asthma in adult, mild intermittent, uncomplicated 82/50/5397  . Special screening for malignant neoplasms, colon 01/07/2016  . Breast nodule 10/12/2015  . Low back pain 12/08/2013  . Fibroids 04/28/2013  . Hot flashes 03/26/2013  . Enlarged uterus 03/26/2013    Outpatient Encounter Prescriptions as of 12/07/2016  Medication Sig  . albuterol (PROVENTIL HFA;VENTOLIN HFA) 108 (90 BASE) MCG/ACT inhaler Inhale 1-2 puffs into the lungs every 6 (six) hours as needed for wheezing or shortness of breath.   . cyclobenzaprine (FLEXERIL) 10 MG tablet Take 1 tablet (10 mg total) by mouth at bedtime.  . diclofenac (VOLTAREN) 75 MG EC tablet Take 1 tablet (75 mg total) by mouth 2 (two) times daily. Take with food  . hydrochlorothiazide (HYDRODIURIL) 25 MG tablet Take 1 tablet (25 mg total) by mouth daily.   No facility-administered encounter medications on file as of 12/07/2016.     Past Medical History:  Diagnosis Date  . Allergy   . Arthritis   . Asthma in adult, mild intermittent, uncomplicated 6/73/4193  . Breast nodule 10/12/2015  . Bulge of lumbar disc without  myelopathy   . Endometrial polyp 04/28/2013  . Fibroids 04/28/2013  . Hot flashes 03/26/2013  . Hypertension   . Irregular bleeding 03/26/2013  . Neuromuscular disorder (HCC)    sciatica  . Seasonal allergies   . Seizures ([place])    last seizure was 2 years ago; unknown etiology. On Keppra.    Past Surgical History:  Procedure Laterality Date  . ABLATION     with polyp removal.  . CESAREAN SECTION     x 3  . COLONOSCOPY WITH PROPOFOL N/A 01/11/2016   Procedure: COLONOSCOPY WITH PROPOFOL;  Surgeon: Danie Binder, MD;  Location: [facility];  Service: Endoscopy;  Laterality: N/A;  800  . EXAMINATION UNDER ANESTHESIA  02/28/2012   Procedure: EXAM UNDER ANESTHESIA;  Surgeon: Donato Heinz, MD;  Location: [facility];  Service: General;  Laterality: N/A;  . GANGLION CYST EXCISION Left   . HYSTEROSCOPY W/D&C N/A 05/23/2013   Procedure: DILATATION AND CURETTAGE /HYSTEROSCOPY;  Surgeon: Jonnie Kind, MD;  Location: [facility];  Service: Gynecology;  Laterality: N/A;  . POLYPECTOMY N/A 05/23/2013   Procedure: ENDOMETRIAL POLYP REMOVAL;  Surgeon: Jonnie Kind, MD;  Location: [facility];  Service: Gynecology;  Laterality: N/A;  . SPHINCTEROTOMY  02/28/2012   Procedure: SPHINCTEROTOMY;  Surgeon: Donato Heinz, MD;  Location: [facility];  Service: General;  Laterality: N/A;  Lateral Internal Sphincterotomy  . TRIGGER  FINGER RELEASE Left    ring finger  . TUBAL LIGATION      Social History   Social History  . Marital status: Single    Spouse name: N/A  . Number of children: 3  . 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 Smoking status: Current Some Day Smoker    Packs/day: 0.25    Years: 20.00    Types: Cigarettes  . Smokeless tobacco: Never Used     Comment: smokes 1 cig daily  . Alcohol use Yes     Comment: occasional  . Drug use: No  . Sexual activity: Yes    Birth control/ protection: Surgical     Comment: tubal   Other  Topics Concern  . 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 Hypertension Mother   . Arthritis Mother   . COPD Mother   . Diabetes Mother   . Heart disease Mother   . Hypertension Father   . Parkinson's disease Father   . Colon cancer Neg Hx     Review of Systems  Constitutional: Negative for chills, fever and weight loss.  HENT: Negative for congestion and hearing loss.   Eyes: Negative for blurred vision and pain.  Respiratory: Negative for cough and shortness of breath.   Cardiovascular: Negative for chest pain and leg swelling.  Gastrointestinal: Negative for abdominal pain, constipation, diarrhea and heartburn.  Genitourinary: Negative for dysuria and frequency.  Musculoskeletal: Positive for back pain. Negative for falls, joint pain and myalgias.  Neurological: Negative for dizziness, seizures and headaches.  Psychiatric/Behavioral: Negative for depression. The patient is not nervous/anxious and does not have insomnia.     BP 114/68 (BP Location: Right Arm, Patient Position: Sitting, Cuff Size: Normal)   Pulse 72   Temp 97.5 F (36.4 C) (Temporal)   Resp 18   Ht 5\' 2"  (1.575 m)   Wt 146 lb (66.2 kg)   SpO2 99%   BMI 26.70 kg/m   Physical Exam  Constitutional: She is oriented to person, place, and time. She appears well-developed and well-nourished.  HENT:  Head: Normocephalic and atraumatic.  Right Ear: External ear normal.  Left Ear: External ear normal.  Mouth/Throat: Oropharynx is clear and moist.  Eyes: Conjunctivae are normal. Pupils are equal, round, and reactive to light.  Neck: Normal range of motion. Neck supple. No thyromegaly present.  Cardiovascular: Normal rate, regular rhythm and normal heart sounds.   Pulmonary/Chest: Effort normal and breath sounds normal. No respiratory  distress.  No wheeze  Abdominal: Soft. Bowel sounds are normal.  Musculoskeletal: Normal range of motion. She exhibits no edema.  Lumbar spine is straight and symmetric. Full range of motion. No tenderness or muscle spasm. Strength, sensation, range of motion, and reflexes are normal in both lower extremities. Straight leg raise is negative bilateral.   Lymphadenopathy:    She has no cervical adenopathy.  Neurological: She is alert and oriented to person, place, and time.  Gait normal  Skin: Skin is warm and dry.  Psychiatric: She has a normal mood and affect. Her behavior is normal. Thought content normal.  Nursing note and vitals reviewed. ASSESSMENT/PLAN:   1. Essential hypertension  -  CBC - COMPLETE METABOLIC PANEL WITH GFR - Lipid panel - Urinalysis, Routine w reflex microscopic  2. Asthma in adult, mild intermittent, uncomplicated   3. Chronic midline low back pain with left-sided sciatica    Patient Instructions  Blood work today  Need old records Dr Legrand Rams  See me in 3 months   Raylene Everts, MD

## 2016-12-07 NOTE — Patient Instructions (Signed)
Blood work today  Need old records Dr Legrand Rams  See me in 3 months

## 2016-12-19 ENCOUNTER — Telehealth: Payer: Self-pay | Admitting: Family Medicine

## 2016-12-19 NOTE — Telephone Encounter (Signed)
New Message  Pt voiced the current medication she takes for her knee is not working any longer and wanting to speak to nurse on which medications she should take.  Please f/u

## 2016-12-19 NOTE — Telephone Encounter (Signed)
Patient informed of message below, verbalized understanding.  

## 2016-12-19 NOTE — Telephone Encounter (Signed)
Left message to call office

## 2016-12-19 NOTE — Telephone Encounter (Signed)
Spoke to [facility], states the voltaren makes her sleepy, and doesn't really help with her knee pain. Was asking if there is something else she can take.

## 2016-12-19 NOTE — Telephone Encounter (Signed)
Called patient regarding message below. No answer, left generic message for patient to return call.   

## 2016-12-19 NOTE — Telephone Encounter (Signed)
OTC ibuprofen 200 ( 4 tabs TID) or Naproxen 220 ( 2 tabs BID).

## 2017-01-12 ENCOUNTER — Ambulatory Visit (INDEPENDENT_AMBULATORY_CARE_PROVIDER_SITE_OTHER): Payer: BLUE CROSS/BLUE SHIELD | Admitting: Family Medicine

## 2017-01-12 ENCOUNTER — Encounter: Payer: Self-pay | Admitting: Family Medicine

## 2017-01-12 VITALS — BP 120/72 | HR 60 | Temp 98.6°F | Resp 16 | Ht 62.0 in | Wt 141.2 lb

## 2017-01-12 DIAGNOSIS — M7061 Trochanteric bursitis, right hip: Secondary | ICD-10-CM

## 2017-01-12 DIAGNOSIS — M5442 Lumbago with sciatica, left side: Secondary | ICD-10-CM

## 2017-01-12 DIAGNOSIS — G8929 Other chronic pain: Secondary | ICD-10-CM | POA: Diagnosis not present

## 2017-01-12 DIAGNOSIS — M5136 Other intervertebral disc degeneration, lumbar region: Secondary | ICD-10-CM

## 2017-01-12 MED ORDER — METHYLPREDNISOLONE 4 MG PO TBPK: ORAL_TABLET | ORAL | 0 refills | Status: DC

## 2017-01-12 MED ORDER — HYDROCODONE-ACETAMINOPHEN 7.5-325 MG PO TABS: 1.0000 | ORAL_TABLET | Freq: Four times a day (QID) | ORAL | 0 refills | Status: DC | PRN

## 2017-01-12 MED ORDER — TIZANIDINE HCL 4 MG PO TABS: 4.0000 mg | ORAL_TABLET | Freq: Four times a day (QID) | ORAL | 0 refills | Status: DC | PRN

## 2017-01-12 MED ORDER — IBUPROFEN 800 MG PO TABS: 800.0000 mg | ORAL_TABLET | Freq: Three times a day (TID) | ORAL | 0 refills | Status: DC | PRN

## 2017-01-12 NOTE — Progress Notes (Signed)
Chief Complaint  Patient presents with  . Back Pain    radiating down left leg, foot tingling, right hip sore  chronic back pain for years.  This is the first time I have evaluated for an acute flare. Known DJD and herniations over time Has had medication, PT, chiropractors and injections Has seen spine specialty, but not for years MANY ER visits for pain  Today has increased pain in low back radiates to L leg.  No injury or change in activity, not responding to OTC medicines Also has pain in the R hip No fever or infection No bowel or bladder complaint  Patient Active Problem List   Diagnosis Date Noted  . Lumbar degenerative disc disease 01/12/2017  . Essential hypertension 12/07/2016  . Asthma in adult, mild intermittent, uncomplicated 44/06/270  . Special screening for malignant neoplasms, colon 01/07/2016  . Breast nodule 10/12/2015  . Low back pain 12/08/2013  . Fibroids 04/28/2013  . Hot flashes 03/26/2013  . Enlarged uterus 03/26/2013    Outpatient Encounter Prescriptions as of 01/12/2017  Medication Sig  . hydrochlorothiazide (HYDRODIURIL) 25 MG tablet Take 1 tablet (25 mg total) by mouth daily.  Marland Kitchen albuterol (PROVENTIL HFA;VENTOLIN HFA) 108 (90 BASE) MCG/ACT inhaler Inhale 1-2 puffs into the lungs every 6 (six) hours as needed for wheezing or shortness of breath.   Marland Kitchen HYDROcodone-acetaminophen (NORCO) 7.5-325 MG tablet Take 1 tablet by mouth every 6 (six) hours as needed for moderate pain.  Marland Kitchen ibuprofen (ADVIL,MOTRIN) 800 MG tablet Take 1 tablet (800 mg total) by mouth every 8 (eight) hours as needed for moderate pain.  . methylPREDNISolone (MEDROL DOSEPAK) 4 MG TBPK tablet tad  . tiZANidine (ZANAFLEX) 4 MG tablet Take 1 tablet (4 mg total) by mouth every 6 (six) hours as needed for muscle spasms.   No facility-administered encounter medications on file as of 01/12/2017.     Allergies  Allergen Reactions  . Lisinopril Swelling    swelling to entire mouth.  .  Penicillins Rash    Has patient had a PCN reaction causing immediate rash, facial/tongue/throat swelling, SOB or lightheadedness with hypotension: no - next day Has patient had a PCN reaction causing severe rash involving mucus membranes or skin necrosis: No Has patient had a PCN reaction that required hospitalization No Has patient had a PCN reaction occurring within the last 10 years: Yes If all of the above answers are "NO", then may proceed with Cephalosporin use.   . Tramadol Other (See Comments)    dizziness    Review of Systems  Constitutional: Positive for appetite change. Negative for activity change, fatigue and unexpected weight change.       Unable to move well  Eyes: Negative for photophobia and visual disturbance.  Respiratory: Negative for cough and shortness of breath.   Cardiovascular: Negative for chest pain and palpitations.  Gastrointestinal: Negative for constipation and diarrhea.  Genitourinary: Negative for difficulty urinating and dysuria.  Musculoskeletal: Positive for back pain and gait problem. Negative for neck pain and neck stiffness.  Neurological: Positive for weakness and numbness.       Left leg  Psychiatric/Behavioral: Positive for sleep disturbance. The patient is not nervous/anxious.     BP 120/72 (BP Location: Left Arm, Patient Position: Sitting, Cuff Size: Normal)   Pulse 60   Temp 98.6 F (37 C) (Other (Comment))   Resp 16   Ht 5\' 2"  (1.575 m)   Wt 141 lb 4 oz (64.1 kg)   SpO2 99%  BMI 25.83 kg/m   Physical Exam  Constitutional: She is oriented to person, place, and time. She appears well-developed and well-nourished. She appears distressed.  Acutely uncomfortable.  Moves slowly.   HENT:  Head: Normocephalic and atraumatic.  Neck: Normal range of motion. Neck supple.  Cardiovascular: Normal rate and regular rhythm.   Pulmonary/Chest: Effort normal and breath sounds normal.  Musculoskeletal:  Lumbar spine is straight and symmetric.  limited range of motion. midline tenderness L3-4-5 to sacrum.  no muscle spasm. Strength, sensation, range of motion, and reflexes are normal in both lower extremities. Straight leg raise positive on L.  No focal neuro deficit.  R greater trochanter very tender   Neurological: She is alert and oriented to person, place, and time.  Psychiatric: She has a normal mood and affect. Her behavior is normal.    ASSESSMENT/PLAN:  1. Chronic midline low back pain with left-sided sciatica  - Ambulatory referral to Neurosurgery  2. Lumbar degenerative disc disease  - Ambulatory referral to Neurosurgery  3. Trochanteric bursitis, right hip Should respond to prednisone and rest.  Advised to use ice   Patient Instructions  Go home Rest Back to work on Monday  Take the medrol dosepak Take the tizanidine as needed - muscle relaxer Take the norco as needed severe pain - this is a strong pain killer Take with food Take at bedtime to sleep  After the medrol can take ibuprofen for pain  See spine specialist  Call if not better by next week   Raylene Everts, MD

## 2017-01-12 NOTE — Patient Instructions (Signed)
Go home Rest Back to work on Monday  Take the medrol dosepak Take the tizanidine as needed - muscle relaxer Take the norco as needed severe pain - this is a strong pain killer Take with food Take at bedtime to sleep  After the medrol can take ibuprofen for pain  See spine specialist  Call if not better by next week

## 2017-01-15 ENCOUNTER — Telehealth: Payer: Self-pay | Admitting: Family Medicine

## 2017-01-15 ENCOUNTER — Encounter: Payer: Self-pay | Admitting: Family Medicine

## 2017-01-15 NOTE — Telephone Encounter (Signed)
Sure, may extend note 2 d

## 2017-01-15 NOTE — Telephone Encounter (Signed)
Patient called nurse line. She states when she was seen the other day, she was written out of work for a couple days. She is still in pain. She asks for another letter writing her out for 2 days.

## 2017-01-15 NOTE — Telephone Encounter (Signed)
Patient informed of message below, verbalized understanding.  

## 2017-01-15 NOTE — Telephone Encounter (Signed)
Patient left message on nurse line asking about letter. Callback # [phone]

## 2017-02-02 LAB — COMPLETE METABOLIC PANEL WITH GFR
ALT: 19 U/L (ref 6–29)
AST: 19 U/L (ref 10–35)
Albumin: 4.5 g/dL (ref 3.6–5.1)
Alkaline Phosphatase: 47 U/L (ref 33–130)
BUN: 8 mg/dL (ref 7–25)
CO2: 28 mmol/L (ref 20–32)
Calcium: 9.7 mg/dL (ref 8.6–10.4)
Chloride: 104 mmol/L (ref 98–110)
Creat: 0.83 mg/dL (ref 0.50–1.05)
GFR, Est African American: >89 mL/min (ref >=60)
GFR, Est Non African American: 82 mL/min (ref >=60)
Glucose, Bld: 80 mg/dL (ref 65–99)
Potassium: 3.1 mmol/L — ABNORMAL LOW (ref 3.5–5.3)
Sodium: 140 mmol/L (ref 135–146)
Total Bilirubin: 0.9 mg/dL (ref 0.2–1.2)
Total Protein: 7.2 g/dL (ref 6.1–8.1)

## 2017-02-02 LAB — LIPID PANEL
Cholesterol: 224 mg/dL — ABNORMAL HIGH (ref <200)
HDL: 69 mg/dL (ref >50)
LDL Cholesterol: 115 mg/dL — ABNORMAL HIGH (ref <100)
Total CHOL/HDL Ratio: 3.2 Ratio (ref <5.0)
Triglycerides: 198 mg/dL — ABNORMAL HIGH (ref <150)
VLDL: 40 mg/dL — ABNORMAL HIGH (ref <30)

## 2017-02-02 LAB — CBC
HCT: 43.5 % (ref 35.0–45.0)
Hemoglobin: 14.7 g/dL (ref 11.7–15.5)
MCH: 31.5 pg (ref 27.0–33.0)
MCHC: 33.8 g/dL (ref 32.0–36.0)
MCV: 93.1 fL (ref 80.0–100.0)
MPV: 10 fL (ref 7.5–12.5)
Platelets: 248 10*3/uL (ref 140–400)
RBC: 4.67 MIL/uL (ref 3.80–5.10)
RDW: 13.9 % (ref 11.0–15.0)
WBC: 5.7 10*3/uL (ref 3.8–10.8)

## 2017-02-02 LAB — URINALYSIS, ROUTINE W REFLEX MICROSCOPIC
Bilirubin Urine: NEGATIVE
Glucose, UA: NEGATIVE
Hgb urine dipstick: NEGATIVE
Ketones, ur: NEGATIVE
Leukocytes, UA: NEGATIVE
Nitrite: NEGATIVE
Protein, ur: NEGATIVE
Specific Gravity, Urine: 1.011 (ref 1.001–1.035)
pH: 6.5 (ref 5.0–8.0)

## 2017-02-05 ENCOUNTER — Encounter: Payer: Self-pay | Admitting: Family Medicine

## 2017-02-07 ENCOUNTER — Other Ambulatory Visit: Payer: Self-pay | Admitting: Family Medicine

## 2017-02-27 ENCOUNTER — Ambulatory Visit (INDEPENDENT_AMBULATORY_CARE_PROVIDER_SITE_OTHER): Payer: BLUE CROSS/BLUE SHIELD | Admitting: Family Medicine

## 2017-02-27 ENCOUNTER — Encounter: Payer: Self-pay | Admitting: Family Medicine

## 2017-02-27 VITALS — BP 108/78 | HR 76 | Temp 98.8°F | Resp 18 | Ht 62.0 in | Wt 141.1 lb

## 2017-02-27 DIAGNOSIS — M25552 Pain in left hip: Secondary | ICD-10-CM

## 2017-02-27 DIAGNOSIS — M25562 Pain in left knee: Secondary | ICD-10-CM

## 2017-02-27 DIAGNOSIS — Z23 Encounter for immunization: Secondary | ICD-10-CM | POA: Diagnosis not present

## 2017-02-27 DIAGNOSIS — I10 Essential (primary) hypertension: Secondary | ICD-10-CM | POA: Diagnosis not present

## 2017-02-27 DIAGNOSIS — W19XXXA Unspecified fall, initial encounter: Secondary | ICD-10-CM | POA: Diagnosis not present

## 2017-02-27 DIAGNOSIS — R55 Syncope and collapse: Secondary | ICD-10-CM | POA: Diagnosis not present

## 2017-02-27 NOTE — Patient Instructions (Signed)
See me in one week Ice to painful area Take the ibuprofen for pain X ray of hip and knee on the left  Cut blood pressure pill in half  No bending or lifting for a week

## 2017-02-27 NOTE — Progress Notes (Signed)
Chief Complaint  Patient presents with  . Dizziness  . Fall    9 17 18    Patient fainted yesterday.  Was at work.  Normal day.  Had eaten, taken her medicine, slept as usual.  No increased stress.  Felt hot, nausea, and went to the bath room.  Leaned over to but seat down to sit - and "fell out"  Awakened promptly on floor.  No incontinence, drowsiness, tongue biting.  She has had seizures before - but this felt different.  No palpitations or chest pain She woke up this morning with increased pain in the left hip and knee.  No bruising.  No wounds.  Pain with weight bearing.    Patient Active Problem List   Diagnosis Date Noted  . Lumbar degenerative disc disease 01/12/2017  . Essential hypertension 12/07/2016  . Asthma in adult, mild intermittent, uncomplicated 02/58/5277  . Special screening for malignant neoplasms, colon 01/07/2016  . Breast nodule 10/12/2015  . Low back pain 12/08/2013  . Fibroids 04/28/2013  . Hot flashes 03/26/2013  . Enlarged uterus 03/26/2013    Outpatient Encounter Prescriptions as of 02/27/2017  Medication Sig  . albuterol (PROVENTIL HFA;VENTOLIN HFA) 108 (90 BASE) MCG/ACT inhaler Inhale 1-2 puffs into the lungs every 6 (six) hours as needed for wheezing or shortness of breath.   Marland Kitchen HYDROcodone-acetaminophen (NORCO) 7.5-325 MG tablet Take 1 tablet by mouth every 6 (six) hours as needed for moderate pain.  Marland Kitchen ibuprofen (ADVIL,MOTRIN) 800 MG tablet TAKE 1 TABLET (800 MG TOTAL) BY MOUTH EVERY 8 (EIGHT) HOURS AS NEEDED FOR MODERATE PAIN.  Marland Kitchen tiZANidine (ZANAFLEX) 4 MG tablet Take 1 tablet (4 mg total) by mouth every 6 (six) hours as needed for muscle spasms.  . hydrochlorothiazide (HYDRODIURIL) 25 MG tablet Take 1 tablet (25 mg total) by mouth daily. (Patient not taking: Reported on 02/27/2017)   No facility-administered encounter medications on file as of 02/27/2017.     Allergies  Allergen Reactions  . Lisinopril Swelling    swelling to entire mouth.  .  Penicillins Rash    Has patient had a PCN reaction causing immediate rash, facial/tongue/throat swelling, SOB or lightheadedness with hypotension: no - next day Has patient had a PCN reaction causing severe rash involving mucus membranes or skin necrosis: No Has patient had a PCN reaction that required hospitalization No Has patient had a PCN reaction occurring within the last 10 years: Yes If all of the above answers are "NO", then may proceed with Cephalosporin use.   . Tramadol Other (See Comments)    dizziness    Review of Systems  Constitutional: Negative for fatigue and unexpected weight change.  HENT: Negative for congestion and dental problem.   Eyes: Negative for photophobia and visual disturbance.  Respiratory: Negative for cough and shortness of breath.   Musculoskeletal: Positive for arthralgias, back pain and gait problem. Negative for joint swelling, myalgias, neck pain and neck stiffness.  Neurological: Positive for dizziness and syncope. Negative for seizures and headaches.  Psychiatric/Behavioral: Positive for dysphoric mood. Negative for sleep disturbance.   BP 108/78 (BP Location: Right Arm, Patient Position: Sitting, Cuff Size: Normal)   Pulse 76   Temp 98.8 F (37.1 C) (Temporal)   Resp 18   Ht 5\' 2"  (1.575 m)   Wt 141 lb 1.9 oz (64 kg)   SpO2 99%   BMI 25.81 kg/m   Physical Exam  Constitutional: She is oriented to person, place, and time. She appears well-developed and  well-nourished.  Uncomfortable, antalgic gait, tearful  HENT:  Head: Normocephalic and atraumatic.  Right Ear: External ear normal.  Left Ear: External ear normal.  Mouth/Throat: Oropharynx is clear and moist.  Eyes: Pupils are equal, round, and reactive to light. Conjunctivae are normal.  Neck: Normal range of motion. Neck supple.  Cardiovascular: Normal rate, regular rhythm and normal heart sounds.   Pulmonary/Chest: Effort normal and breath sounds normal.  Musculoskeletal: Normal  range of motion. She exhibits tenderness. She exhibits no edema.  Tender lateral knee at joint line and prox fib.  Tender left greater trochanter.  Pain with hip and knee ROM.  Neurological: She is alert and oriented to person, place, and time. She displays normal reflexes. No cranial nerve deficit or sensory deficit. She exhibits normal muscle tone. Coordination normal.  Psychiatric: Her behavior is normal. Thought content normal.  labile    ASSESSMENT/PLAN:  1. Need for influenza vaccination  - Flu Vaccine QUAD 36+ mos IM  2. Fall, initial encounter  - DG HIP UNILAT WITH PELVIS 2-3 VIEWS LEFT; Future - DG Knee Complete 4 Views Left; Future  3. Vasovagal syncope   4. Essential hypertension   5. Pain of left hip joint  - DG HIP UNILAT WITH PELVIS 2-3 VIEWS LEFT; Future  6. Acute pain of left knee  - DG Knee Complete 4 Views Left; Future   Patient Instructions  See me in one week Ice to painful area Take the ibuprofen for pain X ray of hip and knee on the left  Cut blood pressure pill in half  No bending or lifting for a week    Raylene Everts, MD

## 2017-02-28 ENCOUNTER — Telehealth: Payer: Self-pay | Admitting: Family Medicine

## 2017-02-28 ENCOUNTER — Ambulatory Visit (HOSPITAL_COMMUNITY)
Admission: RE | Admit: 2017-02-28 | Discharge: 2017-02-28 | Disposition: A | Discharge status: Home / Self Care | Payer: BLUE CROSS/BLUE SHIELD | Source: Ambulatory Visit | Attending: Family Medicine | Admitting: Family Medicine

## 2017-02-28 DIAGNOSIS — M25552 Pain in left hip: Secondary | ICD-10-CM | POA: Diagnosis present

## 2017-02-28 DIAGNOSIS — M25562 Pain in left knee: Secondary | ICD-10-CM | POA: Diagnosis present

## 2017-02-28 DIAGNOSIS — W19XXXA Unspecified fall, initial encounter: Secondary | ICD-10-CM

## 2017-02-28 NOTE — Telephone Encounter (Signed)
Patient wants to know if she

## 2017-03-01 ENCOUNTER — Telehealth: Payer: Self-pay | Admitting: Family Medicine

## 2017-03-01 DIAGNOSIS — M25552 Pain in left hip: Secondary | ICD-10-CM

## 2017-03-01 NOTE — Telephone Encounter (Signed)
Her x rays are normal.  We can refer to ortho if needed.

## 2017-03-01 NOTE — Telephone Encounter (Signed)
Pt aware, wants to see ortho.

## 2017-03-01 NOTE — Telephone Encounter (Signed)
Patient calling to find out the results from the xray done @ [facility].  Also she wants to let you know that the L knee and L hip are not getting better, instead they are getting worse.  The ibuprofen is not working.  Please advise

## 2017-03-01 NOTE — Progress Notes (Signed)
Aware. 

## 2017-03-06 ENCOUNTER — Ambulatory Visit: Payer: BLUE CROSS/BLUE SHIELD | Admitting: Family Medicine

## 2017-03-07 ENCOUNTER — Telehealth: Payer: Self-pay | Admitting: Family Medicine

## 2017-03-07 NOTE — Telephone Encounter (Signed)
Patient left message stating she thought appointment was today, she would like to reschedule.

## 2017-03-08 ENCOUNTER — Encounter (HOSPITAL_COMMUNITY): Payer: Self-pay | Admitting: Emergency Medicine

## 2017-03-08 ENCOUNTER — Emergency Department (HOSPITAL_COMMUNITY)
Admission: EM | Admit: 2017-03-08 | Discharge: 2017-03-08 | Disposition: A | Discharge status: Home / Self Care | Payer: BLUE CROSS/BLUE SHIELD | Attending: Emergency Medicine | Admitting: Emergency Medicine

## 2017-03-08 DIAGNOSIS — F17210 Nicotine dependence, cigarettes, uncomplicated: Secondary | ICD-10-CM | POA: Insufficient documentation

## 2017-03-08 DIAGNOSIS — R51 Headache: Secondary | ICD-10-CM | POA: Diagnosis present

## 2017-03-08 DIAGNOSIS — K068 Other specified disorders of gingiva and edentulous alveolar ridge: Secondary | ICD-10-CM | POA: Diagnosis not present

## 2017-03-08 DIAGNOSIS — I10 Essential (primary) hypertension: Secondary | ICD-10-CM | POA: Insufficient documentation

## 2017-03-08 DIAGNOSIS — J45909 Unspecified asthma, uncomplicated: Secondary | ICD-10-CM | POA: Insufficient documentation

## 2017-03-08 DIAGNOSIS — K029 Dental caries, unspecified: Secondary | ICD-10-CM | POA: Insufficient documentation

## 2017-03-08 MED ORDER — CLINDAMYCIN HCL 150 MG PO CAPS: 150.0000 mg | ORAL_CAPSULE | Freq: Four times a day (QID) | ORAL | 0 refills | Status: DC

## 2017-03-08 NOTE — Discharge Instructions (Signed)
You may also use OTC Orajel as directed.  Return to ER for any worsening symptoms

## 2017-03-08 NOTE — ED Triage Notes (Signed)
Left sided facial swelling that started today. Pt states she has pain from jaw to ear. Pt has dentures in place on top, airway patent.

## 2017-03-08 NOTE — ED Provider Notes (Signed)
[facility] DEPT Provider Note   CSN: [id number] Arrival date & time: 03/08/17  0827     History   Chief Complaint Chief Complaint  Patient presents with  . Facial Pain    HPI Judy Cross is a 51 y.o. female.  HPI  Judy Cross is a 51 y.o. female who presents to the Emergency Department complaining of left-sided facial pain, swelling and irritation of the left upper gums. Patient states symptoms began this morning upon waking, she noticed pain that radiating from her left cheek toward her ear. She states that she wears dentures but does not recall any recent change to her dentures or irritation. She denies ear pain, facial numbness, neck pain, numbness or weakness of the upper extremities, visual changes, nausea or vomiting.   Past Medical History:  Diagnosis Date  . Allergy   . Arthritis   . Asthma in adult, mild intermittent, uncomplicated 5/46/5035  . Breast nodule 10/12/2015  . 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 Endometrial polyp 04/28/2013  . Fibroids 04/28/2013  . Hot flashes 03/26/2013  . Hypertension   . Irregular bleeding 03/26/2013  . Neuromuscular disorder (HCC)    sciatica  . Seasonal allergies   . Seizures ([place])    last seizure was 2 years ago; unknown etiology. On Keppra.    Patient Active Problem List   Diagnosis Date Noted  . Lumbar degenerative disc disease 01/12/2017  . Essential hypertension 12/07/2016  . Asthma in adult, mild intermittent, uncomplicated 46/56/8127  . Special screening for malignant neoplasms, colon 01/07/2016  . Breast nodule 10/12/2015  . Low back pain 12/08/2013  . Fibroids 04/28/2013  . Hot flashes 03/26/2013  . Enlarged uterus 03/26/2013    Past Surgical History:  Procedure Laterality Date  . ABLATION     with polyp removal.  . CESAREAN SECTION     x 3  . COLONOSCOPY WITH PROPOFOL N/A 01/11/2016   Procedure: COLONOSCOPY WITH PROPOFOL;  Surgeon: Danie Binder, MD;  [facility];  Service:  Endoscopy;  Laterality: N/A;  800  . EXAMINATION UNDER ANESTHESIA  02/28/2012   Procedure: EXAM UNDER ANESTHESIA;  Surgeon: Donato Heinz, MD;  Location: [facility] ORS;  Service: General;  Laterality: N/A;  . GANGLION CYST EXCISION Left   . HYSTEROSCOPY W/D&C N/A 05/23/2013   Procedure: DILATATION AND CURETTAGE /HYSTEROSCOPY;  Surgeon: Jonnie Kind, MD;  Location: [facility] ORS;  Service: Gynecology;  Laterality: N/A;  . POLYPECTOMY N/A 05/23/2013   Procedure: ENDOMETRIAL POLYP REMOVAL;  Surgeon: Jonnie Kind, MD;  Location: [facility];  Service: Gynecology;  Laterality: N/A;  . SPHINCTEROTOMY  02/28/2012   Procedure: SPHINCTEROTOMY;  Surgeon: Donato Heinz, MD;  Location: [facility] ORS;  Service: General;  Laterality: N/A;  Lateral Internal Sphincterotomy  . TRIGGER FINGER RELEASE Left    ring finger  . TUBAL LIGATION      OB History    Gravida Para Term Preterm AB Living   4 3     1 3    SAB TAB Ectopic Multiple Live Births   1       3       Home Medications    Prior to Admission medications   Medication Sig Start Date End Date Taking? Authorizing Provider  albuterol (PROVENTIL HFA;VENTOLIN HFA) 108 (90 BASE) MCG/ACT inhaler Inhale 1-2 puffs into the lungs every 6 (six) hours as needed for wheezing or shortness of breath.     [provider]  clindamycin (  CLEOCIN) 150 MG capsule Take 1 capsule (150 mg total) by mouth 4 (four) times daily. 03/08/17   Johnell Bas, PA-C  hydrochlorothiazide (HYDRODIURIL) 25 MG tablet Take 1 tablet (25 mg total) by mouth daily. Patient not taking: Reported on 02/27/2017 12/07/16   Raylene Everts, MD  HYDROcodone-acetaminophen [facility]) 7.5-325 MG tablet Take 1 tablet by mouth every 6 (six) hours as needed for moderate pain. 01/12/17   Raylene Everts, MD  ibuprofen (ADVIL,MOTRIN) 800 MG tablet TAKE 1 TABLET (800 MG TOTAL) BY MOUTH EVERY 8 (EIGHT) HOURS AS NEEDED FOR MODERATE PAIN. 02/07/17   Raylene Everts, MD  tiZANidine (ZANAFLEX) 4 MG tablet Take 1  tablet (4 mg total) by mouth every 6 (six) hours as needed for muscle spasms. 01/12/17   Raylene Everts, MD    Family History Family History  Problem Relation Age of Onset  . Hypertension Mother   . Arthritis Mother   . COPD Mother   . Diabetes Mother   . Heart disease Mother   . Hypertension Father   . Parkinson's disease Father   . Colon cancer Neg Hx     Social History Social History  Substance Use Topics  . Smoking status: Current Some Day Smoker    Packs/day: 0.25    Years: 20.00    Types: Cigarettes  . Smokeless tobacco: Never Used     Comment: smokes 1 cig daily  . Alcohol use Yes     Comment: occasional     Allergies   Lisinopril; Penicillins; and Tramadol   Review of Systems Review of Systems  Constitutional: Negative for appetite change and fever.  HENT: Positive for dental problem and facial swelling. Negative for congestion, ear pain, hearing loss, sinus pressure, sore throat and trouble swallowing.   Eyes: Negative for pain and visual disturbance.  Respiratory: Negative for chest tightness and shortness of breath.   Cardiovascular: Negative for chest pain.  Gastrointestinal: Negative for nausea and vomiting.  Musculoskeletal: Negative for arthralgias, neck pain and neck stiffness.  Skin: Negative for rash and wound.  Neurological: Negative for dizziness, syncope, facial asymmetry, speech difficulty, weakness, numbness and headaches.  Hematological: Negative for adenopathy.  All other systems reviewed and are negative.    Physical Exam Updated Vital Signs BP (!) 133/95 (BP Location: Right Arm)   Pulse 64   Temp 98.2 F (36.8 C) (Oral)   Resp 16   Ht 5\' 2"  (1.575 m)   Wt 64 kg (141 lb)   SpO2 100%   BMI 25.79 kg/m   Physical Exam  Constitutional: She is oriented to person, place, and time. She appears well-developed and well-nourished. No distress.  HENT:  Head: Normocephalic and atraumatic.  Right Ear: Tympanic membrane and ear canal  normal.  Left Ear: Tympanic membrane and ear canal normal.  Mouth/Throat: Uvula is midline, oropharynx is clear and moist and mucous membranes are normal. No trismus in the jaw. Dental caries present. No dental abscesses or uvula swelling.  Patient is partially edentulous. Irritation and tenderness upon palpation of the left upper gingiva. No obvious abscess, mild erythema.  No facial swelling, trismus, or sublingual abnml.    Eyes: Pupils are equal, round, and reactive to light. EOM are normal.  Neck: Normal range of motion. Neck supple.  Cardiovascular: Normal rate, regular rhythm and intact distal pulses.   No murmur heard. Pulmonary/Chest: Effort normal and breath sounds normal.  Abdominal: Soft. She exhibits no distension. There is no tenderness. There is no guarding.  Musculoskeletal: Normal range of motion. She exhibits no tenderness.  Lymphadenopathy:    She has no cervical adenopathy.  Neurological: She is alert and oriented to person, place, and time. No sensory deficit. She exhibits normal muscle tone. Coordination normal.  CN II-XII intact  Skin: Skin is warm and dry. Capillary refill takes less than 2 seconds.  Nursing note and vitals reviewed.    ED Treatments / Results  Labs (all labs ordered are listed, but only abnormal results are displayed) Labs Reviewed - No data to display  EKG  EKG Interpretation None       Radiology No results found.  Procedures Procedures (including critical care time)  Medications Ordered in ED Medications - No data to display   Initial Impression / Assessment and Plan / ED Course  I have reviewed the triage vital signs and the nursing notes.  Pertinent labs & imaging results that were available during my care of the patient were reviewed by me and considered in my medical decision making (see chart for details).    Patient is well-appearing. Vital signs are stable. No obvious facial swelling, erythema, or abscess. No focal  neuro deficits. Patient is mentating well. Agrees to oral rinses, abx and dental f/u.  Return precautions discussed.    Final Clinical Impressions(s) / ED Diagnoses   Final diagnoses:  Pain of gingiva    New Prescriptions New Prescriptions   CLINDAMYCIN (CLEOCIN) 150 MG CAPSULE    Take 1 capsule (150 mg total) by mouth 4 (four) times daily.     Kem Parkinson, PA-C 03/08/17 1123    Forde Dandy, MD 03/08/17 [phone]

## 2017-03-09 ENCOUNTER — Ambulatory Visit (INDEPENDENT_AMBULATORY_CARE_PROVIDER_SITE_OTHER): Payer: BLUE CROSS/BLUE SHIELD | Admitting: Family Medicine

## 2017-03-09 ENCOUNTER — Encounter: Payer: Self-pay | Admitting: Family Medicine

## 2017-03-09 VITALS — BP 120/80 | HR 68 | Temp 98.1°F | Resp 16 | Ht 61.0 in | Wt 141.8 lb

## 2017-03-09 DIAGNOSIS — G44209 Tension-type headache, unspecified, not intractable: Secondary | ICD-10-CM

## 2017-03-09 MED ORDER — BUTALBITAL-APAP-CAFFEINE 50-325-40 MG PO TABS: 1.0000 | ORAL_TABLET | Freq: Four times a day (QID) | ORAL | 0 refills | Status: AC | PRN

## 2017-03-09 NOTE — Patient Instructions (Signed)
Take the headache medicine as needed Call if this does not help

## 2017-03-09 NOTE — Progress Notes (Signed)
Chief Complaint  Patient presents with  . Headache    apartment being remodeled, smell in house    Here for 2 day headache No cough or cold or PND to indicate sinus infection No fall or head trauma No changes in vision No nausea or vomiting No neck pain or stiffness No past history of migraine Thinks it is due to fumes and smells from remodeling the apartment.  Didn't sleep well  Last night  Patient Active Problem List   Diagnosis Date Noted  . Lumbar degenerative disc disease 01/12/2017  . Essential hypertension 12/07/2016  . Asthma in adult, mild intermittent, uncomplicated 98/92/1194  . Special screening for malignant neoplasms, colon 01/07/2016  . Breast nodule 10/12/2015  . Low back pain 12/08/2013  . Fibroids 04/28/2013  . Hot flashes 03/26/2013  . Enlarged uterus 03/26/2013    Outpatient Encounter Prescriptions as of 03/09/2017  Medication Sig  . hydrochlorothiazide (HYDRODIURIL) 25 MG tablet Take 1 tablet (25 mg total) by mouth daily.  Marland Kitchen albuterol (PROVENTIL HFA;VENTOLIN HFA) 108 (90 BASE) MCG/ACT inhaler Inhale 1-2 puffs into the lungs every 6 (six) hours as needed for wheezing or shortness of breath.   . butalbital-acetaminophen-caffeine (FIORICET, ESGIC) 50-325-40 MG tablet Take 1-2 tablets by mouth every 6 (six) hours as needed for headache.  . clindamycin (CLEOCIN) 150 MG capsule Take 1 capsule (150 mg total) by mouth 4 (four) times daily. (Patient not taking: Reported on 03/09/2017)  . ibuprofen (ADVIL,MOTRIN) 800 MG tablet TAKE 1 TABLET (800 MG TOTAL) BY MOUTH EVERY 8 (EIGHT) HOURS AS NEEDED FOR MODERATE PAIN. (Patient not taking: Reported on 03/09/2017)   No facility-administered encounter medications on file as of 03/09/2017.     Allergies  Allergen Reactions  . Lisinopril Swelling    swelling to entire mouth.  . Penicillins Rash    Has patient had a PCN reaction causing immediate rash, facial/tongue/throat swelling, SOB or lightheadedness with  hypotension: no - next day Has patient had a PCN reaction causing severe rash involving mucus membranes or skin necrosis: No Has patient had a PCN reaction that required hospitalization No Has patient had a PCN reaction occurring within the last 10 years: Yes If all of the above answers are "NO", then may proceed with Cephalosporin use.   . Tramadol Other (See Comments)    dizziness    Review of Systems  Constitutional: Negative for appetite change, chills and fever.  HENT: Negative for congestion, dental problem, postnasal drip, rhinorrhea, sinus pain and sore throat.   Eyes: Negative for photophobia, redness and visual disturbance.  Respiratory: Negative for shortness of breath and wheezing.   Cardiovascular: Negative for chest pain and leg swelling.  Gastrointestinal: Negative for nausea and vomiting.  Musculoskeletal: Negative for neck pain and neck stiffness.  Neurological: Positive for headaches. Negative for dizziness, syncope, speech difficulty and numbness.  Psychiatric/Behavioral: Positive for sleep disturbance. Negative for dysphoric mood. The patient is not nervous/anxious.    BP 120/80 (BP Location: Left Arm, Patient Position: Sitting, Cuff Size: Normal)   Pulse 68   Temp 98.1 F (36.7 C) (Other (Comment))   Resp 16   Ht 5\' 1"  (1.549 m)   Wt 141 lb 12 oz (64.3 kg)   SpO2 99%   BMI 26.78 kg/m   Physical Exam  Constitutional: She is oriented to person, place, and time. She appears well-developed and well-nourished.  HENT:  Head: Normocephalic and atraumatic.  Right Ear: External ear normal.  Left Ear: External ear normal.  Nose: Nose normal.  Mouth/Throat: Oropharynx is clear and moist.  No sinus tenderness  Eyes: Pupils are equal, round, and reactive to light. Conjunctivae are normal.  Neck: Normal range of motion. Neck supple. No thyromegaly present.  No tenderness neck muscles  Cardiovascular: Normal rate, regular rhythm and normal heart sounds.     Pulmonary/Chest: Effort normal and breath sounds normal. No respiratory distress.  Musculoskeletal: Normal range of motion. She exhibits no edema.  Lymphadenopathy:    She has no cervical adenopathy.  Neurological: She is alert and oriented to person, place, and time. No cranial nerve deficit. Coordination normal.  Gait normal  Skin: Skin is warm and dry. No rash noted.  Psychiatric: She has a normal mood and affect. Her behavior is normal. Thought content normal.  Nursing note and vitals reviewed.   ASSESSMENT/PLAN:  1. Acute non intractable tension-type headache    Patient Instructions  Take the headache medicine as needed Call if this does not help   Raylene Everts, MD

## 2017-03-14 ENCOUNTER — Encounter: Payer: Self-pay | Admitting: Orthopaedic Surgery

## 2017-03-14 ENCOUNTER — Ambulatory Visit (INDEPENDENT_AMBULATORY_CARE_PROVIDER_SITE_OTHER): Payer: BLUE CROSS/BLUE SHIELD | Admitting: Orthopaedic Surgery

## 2017-03-14 VITALS — BP 123/88 | HR 61 | Temp 98.5°F | Ht 62.0 in | Wt 136.0 lb

## 2017-03-14 DIAGNOSIS — M7062 Trochanteric bursitis, left hip: Secondary | ICD-10-CM

## 2017-03-14 NOTE — Progress Notes (Signed)
Subjective:    Patient ID: Judy Cross, female    DOB: January 29, 1966, 51 y.o.   MRN: [id number]  HPI She fell last week at home and hurt her left hip and thigh laterally.  She has had increasing pain of the left hip.  She saw Dr. Meda Coffee on 03-09-17 and is referred here.  She has pain over the lateral left hip.  She has bruising.  She has no redness, no paresthesias, no other trauma.  Advil had helped as has ice.   Review of Systems  HENT: Negative for congestion.   Respiratory: Positive for shortness of breath. Negative for cough.   Cardiovascular: Negative for chest pain and leg swelling.  Endocrine: Negative for cold intolerance.  Musculoskeletal: Positive for arthralgias and gait problem.  Allergic/Immunologic: Negative for environmental allergies.   Past Medical History:  Diagnosis Date  . Allergy   . Arthritis   . Asthma in adult, mild intermittent, uncomplicated 0/99/8338  . Breast nodule 10/12/2015  . 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 Endometrial polyp 04/28/2013  . Fibroids 04/28/2013  . Hot flashes 03/26/2013  . Hypertension   . Irregular bleeding 03/26/2013  . Neuromuscular disorder (HCC)    sciatica  . Seasonal allergies   . Seizures ([place])    last seizure was 2 years ago; unknown etiology. On Keppra.    Past Surgical History:  Procedure Laterality Date  . ABLATION     with polyp removal.  . CESAREAN SECTION     x 3  . COLONOSCOPY WITH PROPOFOL N/A 01/11/2016   Procedure: COLONOSCOPY WITH PROPOFOL;  Surgeon: Danie Binder, MD;  [facility];  Service: Endoscopy;  Laterality: N/A;  800  . EXAMINATION UNDER ANESTHESIA  02/28/2012   Procedure: EXAM UNDER ANESTHESIA;  Surgeon: Donato Heinz, MD;  Location: [facility] OR[facility];  Service: General;  Laterality: N/A;  . GANGLION CYST EXCISION Left   . HYSTEROSCOPY W/D&C N/A 05/23/2013   Procedure: DILATATION AND CURETTAGE /HYSTEROSCOPY;  Surgeon: Jonnie Kind, MD;  Location: [facility] OR[facility];  Service: Gynecology;   Laterality: N/A;  . POLYPECTOMY N/A 05/23/2013   Procedure: ENDOMETRIAL POLYP REMOVAL;  Surgeon: Jonnie Kind, MD;  Location: [facility];  Service: Gynecology;  Laterality: N/A;  . SPHINCTEROTOMY  02/28/2012   Procedure: SPHINCTEROTOMY;  Surgeon: Donato Heinz, MD;  Location: [facility];  Service: General;  Laterality: N/A;  Lateral Internal Sphincterotomy  . TRIGGER FINGER RELEASE Left    ring finger  . TUBAL LIGATION      Current Outpatient Prescriptions on File Prior to Visit  Medication Sig Dispense Refill  . albuterol (PROVENTIL HFA;VENTOLIN HFA) 108 (90 BASE) MCG/ACT inhaler Inhale 1-2 puffs into the lungs every 6 (six) hours as needed for wheezing or shortness of breath.     . hydrochlorothiazide (HYDRODIURIL) 25 MG tablet Take 1 tablet (25 mg total) by mouth daily. 90 tablet 3  . ibuprofen (ADVIL,MOTRIN) 800 MG tablet TAKE 1 TABLET (800 MG TOTAL) BY MOUTH EVERY 8 (EIGHT) HOURS AS NEEDED FOR MODERATE PAIN. 90 tablet 0  . butalbital-acetaminophen-caffeine (FIORICET, ESGIC) 50-325-40 MG tablet Take 1-2 tablets by mouth every 6 (six) hours as needed for headache. (Patient not taking: Reported on 03/14/2017) 20 tablet 0  . clindamycin (CLEOCIN) 150 MG capsule Take 1 capsule (150 mg total) by mouth 4 (four) times daily. (Patient not taking: Reported on 03/09/2017) 28 capsule 0   No current facility-administered medications on file prior to visit.  Social History   Social History  . Marital status: Single    Spouse name: N/A  . Number of children: 3  . 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 Smoking status: Current Some Day Smoker    Packs/day: 0.25    Years: 20.00    Types: Cigarettes  . Smokeless tobacco: Never Used     Comment: smokes 1 cig daily  . Alcohol use Yes     Comment: occasional  . Drug use: No  . Sexual activity: Yes    Birth control/ protection: Surgical     Comment: tubal   Other Topics Concern  .  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 Hypertension Mother   . Arthritis Mother   . COPD Mother   . Diabetes Mother   . Heart disease Mother   . Hypertension Father   . Parkinson's disease Father   . Colon cancer Neg Hx     BP 123/88   Pulse 61   Temp 98.5 F (36.9 C)   Ht 5\' 2"  (1.575 m)   Wt 136 lb (61.7 kg)   BMI 24.87 kg/m       Objective:   Physical Exam  Constitutional: She is oriented to person, place, and time. She appears well-developed and well-nourished.  HENT:  Head: Normocephalic and atraumatic.  Eyes: Pupils are equal, round, and reactive to light. Conjunctivae and EOM are normal.  Neck: Normal range of motion. Neck supple.  Cardiovascular: Normal rate, regular rhythm and intact distal pulses.   Pulmonary/Chest: Effort normal.  Abdominal: Soft.  Musculoskeletal: She exhibits tenderness (Pain of the lateral hip on the left at the trochanteric area.  Ecchymosis is present.  ROM is full, no limp.  NV intact.  right hip negative.).  Neurological: She is alert and oriented to person, place, and time. She displays normal reflexes. No cranial nerve deficit. She exhibits normal muscle tone. Coordination normal.  Skin: Skin is warm and dry.  Psychiatric: She has a normal mood and affect. Her behavior is normal. Judgment and thought content normal.  Vitals reviewed.         Assessment & Plan:   Encounter Diagnosis  Name Primary?  . Trochanteric bursitis of left hip Yes   PROCEDURE NOTE:  The patient request injection, verbal consent was obtained.  The left trochanteric area of the hip was prepped appropriately after time out was performed.   Sterile technique was observed and injection of 1 cc of Depo-Medrol 40 mg with several cc's of plain xylocaine. Anesthesia was provided by  ethyl chloride and a 20-gauge needle was used to inject the hip area. The injection was tolerated well.  A band aid dressing was applied.  The patient was advised to apply ice later today and tomorrow to the injection sight as needed.  Return in one month.  Call if any problem.  Precautions discussed.   Electronically Signed [name], MD 10/3/20188:54 AM

## 2017-03-14 NOTE — Patient Instructions (Addendum)
Steps to Quit Smoking Smoking tobacco can be bad for your health. It can also affect almost every organ in your body. Smoking puts you and people around you at risk for many serious Karina Nofsinger-lasting (chronic) diseases. Quitting smoking is hard, but it is one of the best things that you can do for your health. It is never too late to quit. What are the benefits of quitting smoking? When you quit smoking, you lower your risk for getting serious diseases and conditions. They can include:  Lung cancer or lung disease.  Heart disease.  Stroke.  Heart attack.  Not being able to have children (infertility).  Weak bones (osteoporosis) and broken bones (fractures).  If you have coughing, wheezing, and shortness of breath, those symptoms may get better when you quit. You may also get sick less often. If you are pregnant, quitting smoking can help to lower your chances of having a baby of low birth weight. What can I do to help me quit smoking? Talk with your doctor about what can help you quit smoking. Some things you can do (strategies) include:  Quitting smoking totally, instead of slowly cutting back how much you smoke over a period of time.  Going to in-person counseling. You are more likely to quit if you go to many counseling sessions.  Using resources and support systems, such as: ? Online chats with a counselor. ? Phone quitlines. ? Printed self-help materials. ? Support groups or group counseling. ? Text messaging programs. ? Mobile phone apps or applications.  Taking medicines. Some of these medicines may have nicotine in them. If you are pregnant or breastfeeding, do not take any medicines to quit smoking unless your doctor says it is okay. Talk with your doctor about counseling or other things that can help you.  Talk with your doctor about using more than one strategy at the same time, such as taking medicines while you are also going to in-person counseling. This can help make  quitting easier. What things can I do to make it easier to quit? Quitting smoking might feel very hard at first, but there is a lot that you can do to make it easier. Take these steps:  Talk to your family and friends. Ask them to support and encourage you.  Call phone quitlines, reach out to support groups, or work with a counselor.  Ask people who smoke to not smoke around you.  Avoid places that make you want (trigger) to smoke, such as: ? Bars. ? Parties. ? Smoke-break areas at work.  Spend time with people who do not smoke.  Lower the stress in your life. Stress can make you want to smoke. Try these things to help your stress: ? Getting regular exercise. ? Deep-breathing exercises. ? Yoga. ? Meditating. ? Doing a body scan. To do this, close your eyes, focus on one area of your body at a time from head to toe, and notice which parts of your body are tense. Try to relax the muscles in those areas.  Download or buy apps on your mobile phone or tablet that can help you stick to your quit plan. There are many free apps, such as QuitGuide from the CDC (Centers for Disease Control and Prevention). You can find more support from smokefree.gov and other websites.  This information is not intended to replace advice given to you by your health care provider. Make sure you discuss any questions you have with your health care provider. Document Released: 03/25/2009 Document   Revised: 01/25/2016 Document Reviewed: 10/13/2014 Elsevier Interactive Patient Education  2018 Elsevier Inc.  

## 2017-04-11 ENCOUNTER — Ambulatory Visit: Payer: BLUE CROSS/BLUE SHIELD | Admitting: Orthopaedic Surgery

## 2017-05-31 ENCOUNTER — Telehealth: Payer: Self-pay | Admitting: Family Medicine

## 2017-05-31 NOTE — Telephone Encounter (Signed)
Pt is calling complaining about knee pain, she doesn't want to go on the sch for Jan, wanted to speak with the nurse to see if she needs to be seen in the ER ..[phone]

## 2017-05-31 NOTE — Telephone Encounter (Signed)
Called and spoke to [facility], states she has been having pain in her knee for a week, has been taking 800mg  motrin w/o relief. Does not want to wait for an appt with dr Meda Coffee, states she is going to the ED today as she has to be able to walk to work.  FYI

## 2017-06-19 ENCOUNTER — Other Ambulatory Visit: Payer: Self-pay | Admitting: Adult Health

## 2017-06-19 ENCOUNTER — Ambulatory Visit (INDEPENDENT_AMBULATORY_CARE_PROVIDER_SITE_OTHER): Payer: BLUE CROSS/BLUE SHIELD | Admitting: Orthopaedic Surgery

## 2017-06-19 ENCOUNTER — Encounter: Payer: Self-pay | Admitting: Orthopaedic Surgery

## 2017-06-19 VITALS — BP 127/92 | HR 80 | Temp 97.8°F | Ht 62.0 in | Wt 131.0 lb

## 2017-06-19 DIAGNOSIS — M25562 Pain in left knee: Secondary | ICD-10-CM | POA: Diagnosis not present

## 2017-06-19 DIAGNOSIS — G8929 Other chronic pain: Secondary | ICD-10-CM

## 2017-06-19 NOTE — Progress Notes (Signed)
Patient Judy Cross, female DOB:Jul 19, 1965, 52 y.o. EAV:[id number]  Chief Complaint  Patient presents with  . Knee Pain    left x 4 weeks     HPI  Judy Cross is a 52 y.o. female who has left knee pain after a fall in September of 2018.  She has had swelling and popping but no giving way.  She has good and bad days. She has no recent trauma. She has tried Tylenol, ibuprofen and heat and ice with some relief.  She has no numbness or redness.  It is getting worse. HPI  Body mass index is 23.96 kg/m.  ROS  Review of Systems  HENT: Negative for congestion.   Respiratory: Positive for shortness of breath. Negative for cough.   Cardiovascular: Negative for chest pain and leg swelling.  Endocrine: Negative for cold intolerance.  Musculoskeletal: Positive for arthralgias and gait problem.  Allergic/Immunologic: Negative for environmental allergies.  All other systems reviewed and are negative.   Past Medical History:  Diagnosis Date  . Allergy   . Arthritis   . Asthma in adult, mild intermittent, uncomplicated 7/82/9562  . Breast nodule 10/12/2015  . 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 Endometrial polyp 04/28/2013  . Fibroids 04/28/2013  . Hot flashes 03/26/2013  . Hypertension   . Irregular bleeding 03/26/2013  . Neuromuscular disorder (HCC)    sciatica  . Seasonal allergies   . Seizures ([place])    last seizure was 2 years ago; unknown etiology. On Keppra.    Past Surgical History:  Procedure Laterality Date  . ABLATION     with polyp removal.  . CESAREAN SECTION     x 3  . COLONOSCOPY WITH PROPOFOL N/A 01/11/2016   Procedure: COLONOSCOPY WITH PROPOFOL;  Surgeon: Danie Binder, MD;  Location: [facility];  Service: Endoscopy;  Laterality: N/A;  800  . EXAMINATION UNDER ANESTHESIA  02/28/2012   Procedure: EXAM UNDER ANESTHESIA;  Surgeon: Donato Heinz, MD;  Location: [facility];  Service: General;  Laterality: N/A;  . GANGLION CYST EXCISION Left   .  HYSTEROSCOPY W/D&C N/A 05/23/2013   Procedure: DILATATION AND CURETTAGE /HYSTEROSCOPY;  Surgeon: Jonnie Kind, MD;  Location: [facility];  Service: Gynecology;  Laterality: N/A;  . POLYPECTOMY N/A 05/23/2013   Procedure: ENDOMETRIAL POLYP REMOVAL;  Surgeon: Jonnie Kind, MD;  Location: [facility];  Service: Gynecology;  Laterality: N/A;  . SPHINCTEROTOMY  02/28/2012   Procedure: SPHINCTEROTOMY;  Surgeon: Donato Heinz, MD;  Location: [facility];  Service: General;  Laterality: N/A;  Lateral Internal Sphincterotomy  . TRIGGER FINGER RELEASE Left    ring finger  . TUBAL LIGATION      Family History  Problem Relation Age of Onset  . Hypertension Mother   . Arthritis Mother   . COPD Mother   . Diabetes Mother   . Heart disease Mother   . Hypertension Father   . Parkinson's disease Father   . Colon cancer Neg Hx     Social History Social History   Tobacco Use  . Smoking status: Current Some Day Smoker    Packs/day: 0.25    Years: 20.00    Pack years: 5.00    Types: Cigarettes  . Smokeless tobacco: Never Used  . Tobacco comment: smokes 1 cig daily  Substance Use Topics  . Alcohol use: Yes    Comment: occasional  . Drug use: No    Allergies  Allergen Reactions  . Lisinopril  Swelling    swelling to entire mouth.  . Penicillins Rash    Has patient had a PCN reaction causing immediate rash, facial/tongue/throat swelling, SOB or lightheadedness with hypotension: no - next day Has patient had a PCN reaction causing severe rash involving mucus membranes or skin necrosis: No Has patient had a PCN reaction that required hospitalization No Has patient had a PCN reaction occurring within the last 10 years: Yes If all of the above answers are "NO", then may proceed with Cephalosporin use.   . Tramadol Other (See Comments)    dizziness    Current Outpatient Medications  Medication Sig Dispense Refill  . hydrochlorothiazide (HYDRODIURIL) 25 MG tablet Take 1 tablet (25 mg total) by  mouth daily. 90 tablet 3  . albuterol (PROVENTIL HFA;VENTOLIN HFA) 108 (90 BASE) MCG/ACT inhaler Inhale 1-2 puffs into the lungs every 6 (six) hours as needed for wheezing or shortness of breath.     . butalbital-acetaminophen-caffeine (FIORICET, ESGIC) 50-325-40 MG tablet Take 1-2 tablets by mouth every 6 (six) hours as needed for headache. (Patient not taking: Reported on 03/14/2017) 20 tablet 0  . HYDROcodone-acetaminophen (NORCO) 7.5-325 MG tablet   0  . ibuprofen (ADVIL,MOTRIN) 800 MG tablet TAKE 1 TABLET (800 MG TOTAL) BY MOUTH EVERY 8 (EIGHT) HOURS AS NEEDED FOR MODERATE PAIN. (Patient not taking: Reported on 06/19/2017) 90 tablet 0  . tiZANidine (ZANAFLEX) 4 MG tablet   0   No current facility-administered medications for this visit.      Physical Exam  Blood pressure (!) 127/92, pulse 80, temperature 97.8 F (36.6 C), height 5\' 2"  (1.575 m), weight 131 lb (59.4 kg).  Constitutional: overall normal hygiene, normal nutrition, well developed, normal grooming, normal body habitus. Assistive device:none  Musculoskeletal: gait and station Limp left, muscle tone and strength are normal, no tremors or atrophy is present.  .  Neurological: coordination overall normal.  Deep tendon reflex/nerve stretch intact.  Sensation normal.  Cranial nerves II-XII intact.   Skin:   Normal overall no scars, lesions, ulcers or rashes. No psoriasis.  Psychiatric: Alert and oriented x 3.  Recent memory intact, remote memory unclear.  Normal mood and affect. Well groomed.  Good eye contact.  Cardiovascular: overall no swelling, no varicosities, no edema bilaterally, normal temperatures of the legs and arms, no clubbing, cyanosis and good capillary refill.  Lymphatic: palpation is normal.  All other systems reviewed and are negative   The left lower extremity is examined:  Inspection:  Thigh:  Non-tender and no defects  Knee has swelling 1+ effusion.                        Joint tenderness is  present                        Patient is tender over the medial joint line  Lower Leg:  Has normal appearance and no tenderness or defects  Ankle:  Non-tender and no defects  Foot:  Non-tender and no defects Range of Motion:  Knee:  Range of motion is: 0-110                        Crepitus is  present  Ankle:  Range of motion is normal. Strength and Tone:  The left lower extremity has normal strength and tone. Stability:  Knee:  The knee is stable.  Ankle:  The ankle is stable.  The patient has been educated about the nature of the problem(s) and counseled on treatment options.  The patient appeared to understand what I have discussed and is in agreement with it.  Encounter Diagnosis  Name Primary?  . Chronic pain of left knee Yes    PLAN Call if any problems.  Precautions discussed.  Continue current medications.   PROCEDURE NOTE:  The patient requests injections of the left knee , verbal consent was obtained.  The left knee was prepped appropriately after time out was performed.   Sterile technique was observed and injection of 1 cc of Depo-Medrol 40 mg with several cc's of plain xylocaine. Anesthesia was provided by ethyl chloride and a 20-gauge needle was used to inject the knee area. The injection was tolerated well.  A band aid dressing was applied.  The patient was advised to apply ice later today and tomorrow to the injection sight as needed.  Return to clinic 2 weeks   Electronically Signed [name], MD 1/8/20199:15 AM

## 2017-06-19 NOTE — Patient Instructions (Signed)
Steps to Quit Smoking Smoking tobacco can be bad for your health. It can also affect almost every organ in your body. Smoking puts you and people around you at risk for many serious long-lasting (chronic) diseases. Quitting smoking is hard, but it is one of the best things that you can do for your health. It is never too late to quit. What are the benefits of quitting smoking? When you quit smoking, you lower your risk for getting serious diseases and conditions. They can include:  Lung cancer or lung disease.  Heart disease.  Stroke.  Heart attack.  Not being able to have children (infertility).  Weak bones (osteoporosis) and broken bones (fractures).  If you have coughing, wheezing, and shortness of breath, those symptoms may get better when you quit. You may also get sick less often. If you are pregnant, quitting smoking can help to lower your chances of having a baby of low birth weight. What can I do to help me quit smoking? Talk with your doctor about what can help you quit smoking. Some things you can do (strategies) include:  Quitting smoking totally, instead of slowly cutting back how much you smoke over a period of time.  Going to in-person counseling. You are more likely to quit if you go to many counseling sessions.  Using resources and support systems, such as: ? Online chats with a counselor. ? Phone quitlines. ? Printed self-help materials. ? Support groups or group counseling. ? Text messaging programs. ? Mobile phone apps or applications.  Taking medicines. Some of these medicines may have nicotine in them. If you are pregnant or breastfeeding, do not take any medicines to quit smoking unless your doctor says it is okay. Talk with your doctor about counseling or other things that can help you.  Talk with your doctor about using more than one strategy at the same time, such as taking medicines while you are also going to in-person counseling. This can help make  quitting easier. What things can I do to make it easier to quit? Quitting smoking might feel very hard at first, but there is a lot that you can do to make it easier. Take these steps:  Talk to your family and friends. Ask them to support and encourage you.  Call phone quitlines, reach out to support groups, or work with a counselor.  Ask people who smoke to not smoke around you.  Avoid places that make you want (trigger) to smoke, such as: ? Bars. ? Parties. ? Smoke-break areas at work.  Spend time with people who do not smoke.  Lower the stress in your life. Stress can make you want to smoke. Try these things to help your stress: ? Getting regular exercise. ? Deep-breathing exercises. ? Yoga. ? Meditating. ? Doing a body scan. To do this, close your eyes, focus on one area of your body at a time from head to toe, and notice which parts of your body are tense. Try to relax the muscles in those areas.  Download or buy apps on your mobile phone or tablet that can help you stick to your quit plan. There are many free apps, such as QuitGuide from the CDC (Centers for Disease Control and Prevention). You can find more support from smokefree.gov and other websites.  This information is not intended to replace advice given to you by your health care provider. Make sure you discuss any questions you have with your health care provider. Document Released: 03/25/2009 Document   Revised: 01/25/2016 Document Reviewed: 10/13/2014 Elsevier Interactive Patient Education  2018 Elsevier Inc.  

## 2017-06-21 ENCOUNTER — Other Ambulatory Visit: Payer: Self-pay

## 2017-06-21 ENCOUNTER — Encounter: Payer: Self-pay | Admitting: Family Medicine

## 2017-06-21 ENCOUNTER — Ambulatory Visit (INDEPENDENT_AMBULATORY_CARE_PROVIDER_SITE_OTHER): Payer: BLUE CROSS/BLUE SHIELD | Admitting: Family Medicine

## 2017-06-21 VITALS — BP 118/72 | HR 56 | Temp 97.1°F | Resp 16 | Ht 62.0 in | Wt 132.1 lb

## 2017-06-21 DIAGNOSIS — M26621 Arthralgia of right temporomandibular joint: Secondary | ICD-10-CM

## 2017-06-21 MED ORDER — NAPROXEN 375 MG PO TABS: 375.0000 mg | ORAL_TABLET | Freq: Three times a day (TID) | ORAL | 1 refills | Status: DC

## 2017-06-21 MED ORDER — CYCLOBENZAPRINE HCL 10 MG PO TABS: 10.0000 mg | ORAL_TABLET | Freq: Every day | ORAL | 0 refills | Status: DC

## 2017-06-21 NOTE — Patient Instructions (Addendum)
Take the naprosyn two times a day Take the flexeril at bedtime Soft foods  Temporomandibular Joint Syndrome Temporomandibular joint (TMJ) syndrome is a condition that affects the joints between your jaw and your skull. The TMJs are located near your ears and allow your jaw to open and close. These joints and the nearby muscles are involved in all movements of the jaw. People with TMJ syndrome have pain in the area of these joints and muscles. Chewing, biting, or other movements of the jaw can be difficult or painful. TMJ syndrome can be caused by various things. In many cases, the condition is mild and goes away within a few weeks. For some people, the condition can become a long-term problem. What are the causes? Possible causes of TMJ syndrome include:  Grinding your teeth or clenching your jaw. Some people do this when they are under stress.  Arthritis.  Injury to the jaw.  Head or neck injury.  Teeth or dentures that are not aligned well.  In some cases, the cause of TMJ syndrome may not be known. What are the signs or symptoms? The most common symptom is an aching pain on the side of the head in the area of the TMJ. Other symptoms may include:  Pain when moving your jaw, such as when chewing or biting.  Being unable to open your jaw all the way.  Making a clicking sound when you open your mouth.  Headache.  Earache.  Neck or shoulder pain.  How is this diagnosed? Diagnosis can usually be made based on your symptoms, your medical history, and a physical exam. Your health care provider may check the range of motion of your jaw. Imaging tests, such as X-rays or an MRI, are sometimes done. You may need to see your dentist to determine if your teeth and jaw are lined up correctly. How is this treated? TMJ syndrome often goes away on its own. If treatment is needed, the options may include:  Eating soft foods and applying ice or heat.  Medicines to relieve pain or  inflammation.  Medicines to relax the muscles.  A splint, bite plate, or mouthpiece to prevent teeth grinding or jaw clenching.  Relaxation techniques or counseling to help reduce stress.  Transcutaneous electrical nerve stimulation (TENS). This helps to relieve pain by applying an electrical current through the skin.  Acupuncture. This is sometimes helpful to relieve pain.  Jaw surgery. This is rarely needed.  Follow these instructions at home:  Take medicines only as directed by your health care provider.  Eat a soft diet if you are having trouble chewing.  Apply ice to the painful area. ? Put ice in a plastic bag. ? Place a towel between your skin and the bag. ? Leave the ice on for 20 minutes, 2-3 times a day.  Apply a warm compress to the painful area as directed.  Massage your jaw area and perform any jaw stretching exercises as recommended by your health care provider.  If you were given a mouthpiece or bite plate, wear it as directed.  Avoid foods that require a lot of chewing. Do not chew gum.  Keep all follow-up visits as directed by your health care provider. This is important. Contact a health care provider if:  You are having trouble eating.  You have new or worsening symptoms. Get help right away if:  Your jaw locks open or closed. This information is not intended to replace advice given to you by your health care  provider. Make sure you discuss any questions you have with your health care provider. Document Released: 02/21/2001 Document Revised: 01/27/2016 Document Reviewed: 01/01/2014 Elsevier Interactive Patient Education  [employer].

## 2017-06-21 NOTE — Progress Notes (Signed)
Chief Complaint  Patient presents with  . Ear Pain    right   Patient complains of right ear pain for about a week.  She states that her hearing is normal.  No cough cold or runny nose.  It hurts when she chews.  No trauma.  She is under stress but does not think she has bruxism.  She has no teeth and does not wear them at night.  She is never had this before.  She has not taken any medication.  She states that the area aches all the time, is causing a headache on the right side of her head.  No visual symptoms.  No nausea.  No history of migraines.  Patient Active Problem List   Diagnosis Date Noted  . Lumbar degenerative disc disease 01/12/2017  . Essential hypertension 12/07/2016  . Asthma in adult, mild intermittent, uncomplicated 96/78/9381  . Special screening for malignant neoplasms, colon 01/07/2016  . Breast nodule 10/12/2015  . Low back pain 12/08/2013  . Fibroids 04/28/2013  . Hot flashes 03/26/2013  . Enlarged uterus 03/26/2013    Outpatient Encounter Medications as of 06/21/2017  Medication Sig  . albuterol (PROVENTIL HFA;VENTOLIN HFA) 108 (90 BASE) MCG/ACT inhaler Inhale 1-2 puffs into the lungs every 6 (six) hours as needed for wheezing or shortness of breath.   . butalbital-acetaminophen-caffeine (FIORICET, ESGIC) 50-325-40 MG tablet Take 1-2 tablets by mouth every 6 (six) hours as needed for headache.  . hydrochlorothiazide (HYDRODIURIL) 25 MG tablet Take 1 tablet (25 mg total) by mouth daily.  Marland Kitchen HYDROcodone-acetaminophen (NORCO) 7.5-325 MG tablet   . ibuprofen (ADVIL,MOTRIN) 800 MG tablet TAKE 1 TABLET (800 MG TOTAL) BY MOUTH EVERY 8 (EIGHT) HOURS AS NEEDED FOR MODERATE PAIN.  Marland Kitchen tiZANidine (ZANAFLEX) 4 MG tablet   . cyclobenzaprine (FLEXERIL) 10 MG tablet Take 1 tablet (10 mg total) by mouth at bedtime.  . naproxen (NAPROSYN) 375 MG tablet Take 1 tablet (375 mg total) by mouth 3 (three) times daily with meals.   No facility-administered encounter medications on  file as of 06/21/2017.     Allergies  Allergen Reactions  . Lisinopril Swelling    swelling to entire mouth.  . Penicillins Rash    Has patient had a PCN reaction causing immediate rash, facial/tongue/throat swelling, SOB or lightheadedness with hypotension: no - next day Has patient had a PCN reaction causing severe rash involving mucus membranes or skin necrosis: No Has patient had a PCN reaction that required hospitalization No Has patient had a PCN reaction occurring within the last 10 years: Yes If all of the above answers are "NO", then may proceed with Cephalosporin use.   . Tramadol Other (See Comments)    dizziness    Review of Systems  Constitutional: Negative for appetite change, chills and fever.  HENT: Positive for ear pain. Negative for congestion, dental problem, postnasal drip, rhinorrhea, sinus pain and sore throat.   Eyes: Negative for photophobia, redness and visual disturbance.  Respiratory: Negative for shortness of breath and wheezing.   Cardiovascular: Negative for chest pain and leg swelling.  Gastrointestinal: Negative for nausea and vomiting.  Musculoskeletal: Negative for neck pain and neck stiffness.  Neurological: Negative for dizziness, syncope, speech difficulty, numbness and headaches.  Psychiatric/Behavioral: Positive for sleep disturbance. Negative for dysphoric mood. The patient is not nervous/anxious.     BP 118/72 (BP Location: Right Arm, Patient Position: Sitting, Cuff Size: Normal)   Pulse (!) 56   Temp (!) 97.1 F (36.2  C) (Temporal)   Resp 16   Ht 5\' 2"  (1.575 m)   Wt 132 lb 1.3 oz (59.9 kg)   SpO2 100%   BMI 24.16 kg/m   Physical Exam  Constitutional: She is oriented to person, place, and time. She appears well-developed and well-nourished.  HENT:  Head: Normocephalic and atraumatic.  Right Ear: External ear normal.  Left Ear: External ear normal.  Nose: Nose normal.  Mouth/Throat: Oropharynx is clear and moist.  Tenderness  right TMJ  Eyes: Conjunctivae and EOM are normal. Pupils are equal, round, and reactive to light.  Neck: Normal range of motion. Neck supple.  Cardiovascular: Normal rate, regular rhythm and normal heart sounds.  Pulmonary/Chest: Effort normal and breath sounds normal.  Lymphadenopathy:    She has no cervical adenopathy.  Neurological: She is alert and oriented to person, place, and time. No cranial nerve deficit.  Skin: Skin is warm and dry.  Psychiatric: She has a normal mood and affect. Her behavior is normal.    ASSESSMENT/PLAN:  1. Arthralgia of right temporomandibular joint    Patient Instructions  Take the naprosyn two times a day Take the flexeril at bedtime Soft foods  Temporomandibular Joint Syndrome Temporomandibular joint (TMJ) syndrome is a condition that affects the joints between your jaw and your skull. The TMJs are located near your ears and allow your jaw to open and close. These joints and the nearby muscles are involved in all movements of the jaw. People with TMJ syndrome have pain in the area of these joints and muscles. Chewing, biting, or other movements of the jaw can be difficult or painful. TMJ syndrome can be caused by various things. In many cases, the condition is mild and goes away within a few weeks. For some people, the condition can become a long-term problem. What are the causes? Possible causes of TMJ syndrome include:  Grinding your teeth or clenching your jaw. Some people do this when they are under stress.  Arthritis.  Injury to the jaw.  Head or neck injury.  Teeth or dentures that are not aligned well.  In some cases, the cause of TMJ syndrome may not be known. What are the signs or symptoms? The most common symptom is an aching pain on the side of the head in the area of the TMJ. Other symptoms may include:  Pain when moving your jaw, such as when chewing or biting.  Being unable to open your jaw all the way.  Making a clicking  sound when you open your mouth.  Headache.  Earache.  Neck or shoulder pain.  How is this diagnosed? Diagnosis can usually be made based on your symptoms, your medical history, and a physical exam. Your health care provider may check the range of motion of your jaw. Imaging tests, such as X-rays or an MRI, are sometimes done. You may need to see your dentist to determine if your teeth and jaw are lined up correctly. How is this treated? TMJ syndrome often goes away on its own. If treatment is needed, the options may include:  Eating soft foods and applying ice or heat.  Medicines to relieve pain or inflammation.  Medicines to relax the muscles.  A splint, bite plate, or mouthpiece to prevent teeth grinding or jaw clenching.  Relaxation techniques or counseling to help reduce stress.  Transcutaneous electrical nerve stimulation (TENS). This helps to relieve pain by applying an electrical current through the skin.  Acupuncture. This is sometimes helpful to relieve  pain.  Jaw surgery. This is rarely needed.  Follow these instructions at home:  Take medicines only as directed by your health care provider.  Eat a soft diet if you are having trouble chewing.  Apply ice to the painful area. ? Put ice in a plastic bag. ? Place a towel between your skin and the bag. ? Leave the ice on for 20 minutes, 2-3 times a day.  Apply a warm compress to the painful area as directed.  Massage your jaw area and perform any jaw stretching exercises as recommended by your health care provider.  If you were given a mouthpiece or bite plate, wear it as directed.  Avoid foods that require a lot of chewing. Do not chew gum.  Keep all follow-up visits as directed by your health care provider. This is important. Contact a health care provider if:  You are having trouble eating.  You have new or worsening symptoms. Get help right away if:  Your jaw locks open or closed. This information is  not intended to replace advice given to you by your health care provider. Make sure you discuss any questions you have with your health care provider. Document Released: 02/21/2001 Document Revised: 01/27/2016 Document Reviewed: 01/01/2014 Elsevier Interactive Patient Education  2018 [employer] Inc.    Raylene Everts, MD

## 2017-07-03 ENCOUNTER — Encounter: Payer: Self-pay | Admitting: Orthopaedic Surgery

## 2017-07-03 ENCOUNTER — Ambulatory Visit (INDEPENDENT_AMBULATORY_CARE_PROVIDER_SITE_OTHER): Payer: BLUE CROSS/BLUE SHIELD | Admitting: Orthopaedic Surgery

## 2017-07-03 VITALS — BP 128/84 | HR 81 | Ht 62.0 in | Wt 129.0 lb

## 2017-07-03 DIAGNOSIS — G8929 Other chronic pain: Secondary | ICD-10-CM

## 2017-07-03 DIAGNOSIS — M25562 Pain in left knee: Secondary | ICD-10-CM | POA: Diagnosis not present

## 2017-07-03 MED ORDER — NAPROXEN 500 MG PO TABS: 500.0000 mg | ORAL_TABLET | Freq: Two times a day (BID) | ORAL | 5 refills | Status: DC

## 2017-07-03 NOTE — Progress Notes (Signed)
Patient Judy Cross, female DOB:10/04/1965, 52 y.o. IHK:[id number]  Chief Complaint  Patient presents with  . Follow-up    Left Knee    HPI  Judy Cross is a 52 y.o. female who has left knee pain.  She has more pain recently.  She has no trauma, no giving way. She has swelling and popping.  She is not on any NSAID.  Dr. Meda Coffee had given Naprosyn but she is out of it she says.  I will give new Rx for 500 mgm bid. HPI  Body mass index is 23.59 kg/m.  ROS  Review of Systems  HENT: Negative for congestion.   Respiratory: Positive for shortness of breath. Negative for cough.   Cardiovascular: Negative for chest pain and leg swelling.  Endocrine: Negative for cold intolerance.  Musculoskeletal: Positive for arthralgias and gait problem.  Allergic/Immunologic: Negative for environmental allergies.  All other systems reviewed and are negative.   Past Medical History:  Diagnosis Date  . Allergy   . Arthritis   . Asthma in adult, mild intermittent, uncomplicated 7/56/4332  . Breast nodule 10/12/2015  . 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 Endometrial polyp 04/28/2013  . Fibroids 04/28/2013  . Hot flashes 03/26/2013  . Hypertension   . Irregular bleeding 03/26/2013  . Neuromuscular disorder (HCC)    sciatica  . Seasonal allergies   . Seizures ([place])    last seizure was 2 years ago; unknown etiology. On Keppra.    Past Surgical History:  Procedure Laterality Date  . ABLATION     with polyp removal.  . CESAREAN SECTION     x 3  . COLONOSCOPY WITH PROPOFOL N/A 01/11/2016   Procedure: COLONOSCOPY WITH PROPOFOL;  Surgeon: Danie Binder, MD;  Location: [facility];  Service: Endoscopy;  Laterality: N/A;  800  . EXAMINATION UNDER ANESTHESIA  02/28/2012   Procedure: EXAM UNDER ANESTHESIA;  Surgeon: Donato Heinz, MD;  Location: [facility];  Service: General;  Laterality: N/A;  . GANGLION CYST EXCISION Left   . HYSTEROSCOPY W/D&C N/A 05/23/2013   Procedure: DILATATION  AND CURETTAGE /HYSTEROSCOPY;  Surgeon: Jonnie Kind, MD;  Location: [facility];  Service: Gynecology;  Laterality: N/A;  . POLYPECTOMY N/A 05/23/2013   Procedure: ENDOMETRIAL POLYP REMOVAL;  Surgeon: Jonnie Kind, MD;  Location: [facility];  Service: Gynecology;  Laterality: N/A;  . SPHINCTEROTOMY  02/28/2012   Procedure: SPHINCTEROTOMY;  Surgeon: Donato Heinz, MD;  Location: [facility];  Service: General;  Laterality: N/A;  Lateral Internal Sphincterotomy  . TRIGGER FINGER RELEASE Left    ring finger  . TUBAL LIGATION      Family History  Problem Relation Age of Onset  . Hypertension Mother   . Arthritis Mother   . COPD Mother   . Diabetes Mother   . Heart disease Mother   . Hypertension Father   . Parkinson's disease Father   . Colon cancer Neg Hx     Social History Social History   Tobacco Use  . Smoking status: Current Some Day Smoker    Packs/day: 0.25    Years: 20.00    Pack years: 5.00    Types: Cigarettes  . Smokeless tobacco: Never Used  . Tobacco comment: smokes 1 cig daily  Substance Use Topics  . Alcohol use: Yes    Comment: occasional  . Drug use: No    Allergies  Allergen Reactions  . Lisinopril Swelling    swelling to entire mouth.  Marland Kitchen  Penicillins Rash    Has patient had a PCN reaction causing immediate rash, facial/tongue/throat swelling, SOB or lightheadedness with hypotension: no - next day Has patient had a PCN reaction causing severe rash involving mucus membranes or skin necrosis: No Has patient had a PCN reaction that required hospitalization No Has patient had a PCN reaction occurring within the last 10 years: Yes If all of the above answers are "NO", then may proceed with Cephalosporin use.   . Tramadol Other (See Comments)    dizziness    Current Outpatient Medications  Medication Sig Dispense Refill  . albuterol (PROVENTIL HFA;VENTOLIN HFA) 108 (90 BASE) MCG/ACT inhaler Inhale 1-2 puffs into the lungs every 6 (six) hours as needed for  wheezing or shortness of breath.     . butalbital-acetaminophen-caffeine (FIORICET, ESGIC) 50-325-40 MG tablet Take 1-2 tablets by mouth every 6 (six) hours as needed for headache. 20 tablet 0  . cyclobenzaprine (FLEXERIL) 10 MG tablet Take 1 tablet (10 mg total) by mouth at bedtime. 30 tablet 0  . hydrochlorothiazide (HYDRODIURIL) 25 MG tablet Take 1 tablet (25 mg total) by mouth daily. 90 tablet 3  . HYDROcodone-acetaminophen (NORCO) 7.5-325 MG tablet   0  . naproxen (NAPROSYN) 500 MG tablet Take 1 tablet (500 mg total) by mouth 2 (two) times daily with a meal. 60 tablet 5  . tiZANidine (ZANAFLEX) 4 MG tablet   0   No current facility-administered medications for this visit.      Physical Exam  Blood pressure 128/84, pulse 81, height 5\' 2"  (1.575 m), weight 129 lb (58.5 kg).  Constitutional: overall normal hygiene, normal nutrition, well developed, normal grooming, normal body habitus. Assistive device:none  Musculoskeletal: gait and station Limp none, muscle tone and strength are normal, no tremors or atrophy is present.  .  Neurological: coordination overall normal.  Deep tendon reflex/nerve stretch intact.  Sensation normal.  Cranial nerves II-XII intact.   Skin:   Normal overall no scars, lesions, ulcers or rashes. No psoriasis.  Psychiatric: Alert and oriented x 3.  Recent memory intact, remote memory unclear.  Normal mood and affect. Well groomed.  Good eye contact.  Cardiovascular: overall no swelling, no varicosities, no edema bilaterally, normal temperatures of the legs and arms, no clubbing, cyanosis and good capillary refill.  Lymphatic: palpation is normal.  Left knee is tender, ROM 0 to 110, crepitus, slight effusion, Stable knee, NV intact.  No limp today.  All other systems reviewed and are negative   The patient has been educated about the nature of the problem(s) and counseled on treatment options.  The patient appeared to understand what I have discussed and is  in agreement with it.  Encounter Diagnosis  Name Primary?  . Chronic pain of left knee Yes    PLAN Call if any problems.  Precautions discussed.  New Rx for Naprosyn called in.  Return to clinic 1 month   Electronically Signed [name], MD 1/22/20199:32 AM

## 2017-07-03 NOTE — Patient Instructions (Signed)
Steps to Quit Smoking Smoking tobacco can be bad for your health. It can also affect almost every organ in your body. Smoking puts you and people around you at risk for many serious [place]-lasting (chronic) diseases. Quitting smoking is hard, but it is one of the best things that you can do for your health. It is never too late to quit. What are the benefits of quitting smoking? When you quit smoking, you lower your risk for getting serious diseases and conditions. They can include:  Lung cancer or lung disease.  Heart disease.  Stroke.  Heart attack.  Not being able to have children (infertility).  Weak bones (osteoporosis) and broken bones (fractures).  If you have coughing, wheezing, and shortness of breath, those symptoms may get better when you quit. You may also get sick less often. If you are pregnant, quitting smoking can help to lower your chances of having a baby of low birth weight. What can I do to help me quit smoking? Talk with your doctor about what can help you quit smoking. Some things you can do (strategies) include:  Quitting smoking totally, instead of slowly cutting back how much you smoke over a period of time.  Going to in-person counseling. You are more likely to quit if you go to many counseling sessions.  Using resources and support systems, such as: ? Online chats with a counselor. ? Phone quitlines. ? Printed self-help materials. ? Support groups or group counseling. ? Text messaging programs. ? Mobile phone apps or applications.  Taking medicines. Some of these medicines may have nicotine in them. If you are pregnant or breastfeeding, do not take any medicines to quit smoking unless your doctor says it is okay. Talk with your doctor about counseling or other things that can help you.  Talk with your doctor about using more than one strategy at the same time, such as taking medicines while you are also going to in-person counseling. This can help make  quitting easier. What things can I do to make it easier to quit? Quitting smoking might feel very hard at first, but there is a lot that you can do to make it easier. Take these steps:  Talk to your family and friends. Ask them to support and encourage you.  Call phone quitlines, reach out to support groups, or work with a counselor.  Ask people who smoke to not smoke around you.  Avoid places that make you want (trigger) to smoke, such as: ? Bars. ? Parties. ? Smoke-break areas at work.  Spend time with people who do not smoke.  Lower the stress in your life. Stress can make you want to smoke. Try these things to help your stress: ? Getting regular exercise. ? Deep-breathing exercises. ? Yoga. ? Meditating. ? Doing a body scan. To do this, close your eyes, focus on one area of your body at a time from head to toe, and notice which parts of your body are tense. Try to relax the muscles in those areas.  Download or buy apps on your mobile phone or tablet that can help you stick to your quit plan. There are many free apps, such as QuitGuide from the CDC (Centers for Disease Control and Prevention). You can find more support from smokefree.gov and other websites.  This information is not intended to replace advice given to you by your health care provider. Make sure you discuss any questions you have with your health care provider. Document Released: 03/25/2009 Document   Revised: 01/25/2016 Document Reviewed: 10/13/2014 Elsevier Interactive Patient Education  2018 Elsevier Inc.  

## 2017-07-12 ENCOUNTER — Other Ambulatory Visit: Payer: Self-pay | Admitting: Family Medicine

## 2017-07-12 ENCOUNTER — Ambulatory Visit (INDEPENDENT_AMBULATORY_CARE_PROVIDER_SITE_OTHER): Payer: BLUE CROSS/BLUE SHIELD | Admitting: Family Medicine

## 2017-07-12 ENCOUNTER — Encounter: Payer: Self-pay | Admitting: Family Medicine

## 2017-07-12 ENCOUNTER — Other Ambulatory Visit: Payer: Self-pay

## 2017-07-12 VITALS — BP 126/82 | HR 84 | Temp 98.6°F | Resp 16 | Ht 62.0 in | Wt 137.0 lb

## 2017-07-12 DIAGNOSIS — R51 Headache: Secondary | ICD-10-CM

## 2017-07-12 DIAGNOSIS — M26621 Arthralgia of right temporomandibular joint: Secondary | ICD-10-CM | POA: Diagnosis not present

## 2017-07-12 DIAGNOSIS — G8929 Other chronic pain: Secondary | ICD-10-CM

## 2017-07-12 DIAGNOSIS — R519 Headache, unspecified: Secondary | ICD-10-CM

## 2017-07-12 LAB — CBC WITH DIFFERENTIAL/PLATELET
Basophils Absolute: 23 cells/uL (ref 0–200)
Basophils Relative: 0.4 %
Eosinophils Absolute: 68 cells/uL (ref 15–500)
Eosinophils Relative: 1.2 %
HCT: 38.9 % (ref 35.0–45.0)
Hemoglobin: 13.5 g/dL (ref 11.7–15.5)
Lymphs Abs: 3010 cells/uL (ref 850–3900)
MCH: 31.3 pg (ref 27.0–33.0)
MCHC: 34.7 g/dL (ref 32.0–36.0)
MCV: 90.3 fL (ref 80.0–100.0)
MPV: 10.5 fL (ref 7.5–12.5)
Monocytes Relative: 4.8 %
Neutro Abs: 2326 cells/uL (ref 1500–7800)
Neutrophils Relative %: 40.8 %
Platelets: 239 10*3/uL (ref 140–400)
RBC: 4.31 10*6/uL (ref 3.80–5.10)
RDW: 13.2 % (ref 11.0–15.0)
Total Lymphocyte: 52.8 %
WBC mixed population: 274 cells/uL (ref 200–950)
WBC: 5.7 10*3/uL (ref 3.8–10.8)

## 2017-07-12 LAB — SEDIMENTATION RATE: Sed Rate: 14 mm/h (ref 0–30)

## 2017-07-12 MED ORDER — PREDNISONE 20 MG PO TABS: 20.0000 mg | ORAL_TABLET | Freq: Every day | ORAL | 0 refills | Status: DC

## 2017-07-12 NOTE — Patient Instructions (Signed)
Need blood work today Take the prednisone daily No advil/aleve while on the prednisone See me in 7-10 days

## 2017-07-12 NOTE — Progress Notes (Signed)
Chief Complaint  Patient presents with  . Headache   Patient is back for her headache.  She is been complaining of headaches consistently since September.  At her last visit identified that she had some TMJ.  It was tender with chewing, and tender to palpation of her temporal mandibular joint.  This was treated with anti-inflammatories and soft diet.  She states that her jaw joint feels little bit better, but she still has a right-sided headache.  She points to her temple.  She states it feels swollen.  She states it is tender.  She has no visual symptoms.  No nausea or vomiting.  No history of migraines.  She states that she does not have any fever or chills, body aches or malaise, fatigue or constitutional symptoms.  No musculoskeletal pain. She has had no sinus symptoms or infection. She did, this week, bump her forehead when she was opening a car door.  No loss of consciousness.  This feels different than her headaches.  Headaches are not caused by this trauma. Patient Active Problem List   Diagnosis Date Noted  . Lumbar degenerative disc disease 01/12/2017  . Essential hypertension 12/07/2016  . Asthma in adult, mild intermittent, uncomplicated 22/63/3354  . Special screening for malignant neoplasms, colon 01/07/2016  . Breast nodule 10/12/2015  . Low back pain 12/08/2013  . Fibroids 04/28/2013  . Hot flashes 03/26/2013  . Enlarged uterus 03/26/2013    Outpatient Encounter Medications as of 07/12/2017  Medication Sig  . albuterol (PROVENTIL HFA;VENTOLIN HFA) 108 (90 BASE) MCG/ACT inhaler Inhale 1-2 puffs into the lungs every 6 (six) hours as needed for wheezing or shortness of breath.   . butalbital-acetaminophen-caffeine (FIORICET, ESGIC) 50-325-40 MG tablet Take 1-2 tablets by mouth every 6 (six) hours as needed for headache.  . cyclobenzaprine (FLEXERIL) 10 MG tablet Take 1 tablet (10 mg total) by mouth at bedtime.  . hydrochlorothiazide (HYDRODIURIL) 25 MG tablet Take 1 tablet  (25 mg total) by mouth daily.  . naproxen (NAPROSYN) 500 MG tablet Take 1 tablet (500 mg total) by mouth 2 (two) times daily with a meal.  . HYDROcodone-acetaminophen (NORCO) 7.5-325 MG tablet   . predniSONE (DELTASONE) 20 MG tablet Take 1 tablet (20 mg total) by mouth daily with breakfast.  . tiZANidine (ZANAFLEX) 4 MG tablet    No facility-administered encounter medications on file as of 07/12/2017.     Allergies  Allergen Reactions  . Lisinopril Swelling    swelling to entire mouth.  . Penicillins Rash    Has patient had a PCN reaction causing immediate rash, facial/tongue/throat swelling, SOB or lightheadedness with hypotension: no - next day Has patient had a PCN reaction causing severe rash involving mucus membranes or skin necrosis: No Has patient had a PCN reaction that required hospitalization No Has patient had a PCN reaction occurring within the last 10 years: Yes If all of the above answers are "NO", then may proceed with Cephalosporin use.   . Tramadol Other (See Comments)    dizziness    Review of Systems  Constitutional: Negative for appetite change, chills and fever.  HENT: Negative for congestion, dental problem, postnasal drip, rhinorrhea, sinus pain and sore throat.   Eyes: Negative for photophobia, redness and visual disturbance.  Respiratory: Negative for shortness of breath and wheezing.   Cardiovascular: Negative for chest pain and leg swelling.  Gastrointestinal: Negative for nausea and vomiting.  Musculoskeletal: Negative for neck pain and neck stiffness.  Neurological: Positive for headaches.  Negative for dizziness, syncope, speech difficulty and numbness.  Psychiatric/Behavioral: Positive for sleep disturbance. Negative for dysphoric mood. The patient is not nervous/anxious.     BP 126/82 (BP Location: Left Arm, Patient Position: Sitting, Cuff Size: Normal)   Pulse 84   Temp 98.6 F (37 C) (Temporal)   Resp 16   Ht 5' 2"  (1.575 m)   Wt 137 lb (62.1  kg)   SpO2 96%   BMI 25.06 kg/m   Physical Exam  Constitutional: She is oriented to person, place, and time. She appears well-developed and well-nourished.  HENT:  Head: Normocephalic and atraumatic.    Right Ear: External ear normal.  Left Ear: External ear normal.  Nose: Nose normal.  Mouth/Throat: Oropharynx is clear and moist.  No sinus tenderness.  Mild tenderness remains over right TMJ.  Mild tenderness over right temple, palpable temporal artery, mildly tender.  Not enlarged or firm.  Eyes: Conjunctivae and EOM are normal. Pupils are equal, round, and reactive to light.  Neck: Normal range of motion. Neck supple. No thyromegaly present.  No tenderness neck muscles  Cardiovascular: Normal rate, regular rhythm and normal heart sounds.  Pulmonary/Chest: Effort normal and breath sounds normal. No respiratory distress.  Musculoskeletal: Normal range of motion. She exhibits no edema.  Lymphadenopathy:    She has no cervical adenopathy.  Neurological: She is alert and oriented to person, place, and time. No cranial nerve deficit. Coordination normal.  Gait normal  Skin: Skin is warm and dry. No rash noted.  Psychiatric: She has a normal mood and affect. Her behavior is normal. Thought content normal.  Nursing note and vitals reviewed.   ASSESSMENT/PLAN:  1. Arthralgia of right temporomandibular joint Persistent mild TMJ. - CBC with Differential/Platelet - Sed Rate (ESR)  2. Chronic nonintractable headache, unspecified headache type Headaches, possible giant cell arteritis.  No constitutional symptoms. - CBC with Differential/Platelet - Sed Rate (ESR)   Patient Instructions  Need blood work today Take the prednisone daily No advil/aleve while on the prednisone See me in 7-10 days    Raylene Everts, MD

## 2017-07-17 ENCOUNTER — Other Ambulatory Visit: Payer: Self-pay | Admitting: Adult Health

## 2017-07-20 ENCOUNTER — Ambulatory Visit: Payer: BLUE CROSS/BLUE SHIELD | Admitting: Family Medicine

## 2017-07-24 ENCOUNTER — Ambulatory Visit: Payer: BLUE CROSS/BLUE SHIELD | Admitting: Family Medicine

## 2017-07-31 ENCOUNTER — Ambulatory Visit: Payer: BLUE CROSS/BLUE SHIELD | Admitting: Orthopaedic Surgery

## 2017-08-02 ENCOUNTER — Encounter: Payer: Self-pay | Admitting: Orthopaedic Surgery

## 2017-08-02 ENCOUNTER — Ambulatory Visit (INDEPENDENT_AMBULATORY_CARE_PROVIDER_SITE_OTHER): Payer: BLUE CROSS/BLUE SHIELD | Admitting: Orthopaedic Surgery

## 2017-08-02 VITALS — BP 152/103 | HR 63 | Temp 97.9°F | Ht 62.0 in | Wt 135.0 lb

## 2017-08-02 DIAGNOSIS — M25562 Pain in left knee: Secondary | ICD-10-CM | POA: Diagnosis not present

## 2017-08-02 DIAGNOSIS — G8929 Other chronic pain: Secondary | ICD-10-CM | POA: Diagnosis not present

## 2017-08-02 NOTE — Progress Notes (Signed)
CC:  I have pain of my left knee. I would like an injection.  The patient has chronic pain of the left knee.  There is no recent trauma.  There is no redness.  Injections in the past have helped.  The knee has no redness, has an effusion and crepitus present.  ROM of the left knee is 0-110.  Impression:  Chronic knee pain left  Return: 3 months  PROCEDURE NOTE:  The patient requests injections of the left knee, verbal consent was obtained.  The left knee was prepped appropriately after time out was performed.   Sterile technique was observed and injection of 1 cc of Depo-Medrol 40 mg with several cc's of plain xylocaine. Anesthesia was provided by ethyl chloride and a 20-gauge needle was used to inject the knee area. The injection was tolerated well.  A band aid dressing was applied.  The patient was advised to apply ice later today and tomorrow to the injection sight as needed.  Electronically Signed [name], MD 2/21/20198:24 AM

## 2017-08-02 NOTE — Patient Instructions (Signed)
Steps to Quit Smoking Smoking tobacco can be bad for your health. It can also affect almost every organ in your body. Smoking puts you and people around you at risk for many serious long-lasting (chronic) diseases. Quitting smoking is hard, but it is one of the best things that you can do for your health. It is never too late to quit. What are the benefits of quitting smoking? When you quit smoking, you lower your risk for getting serious diseases and conditions. They can include:  Lung cancer or lung disease.  Heart disease.  Stroke.  Heart attack.  Not being able to have children (infertility).  Weak bones (osteoporosis) and broken bones (fractures).  If you have coughing, wheezing, and shortness of breath, those symptoms may get better when you quit. You may also get sick less often. If you are pregnant, quitting smoking can help to lower your chances of having a baby of low birth weight. What can I do to help me quit smoking? Talk with your doctor about what can help you quit smoking. Some things you can do (strategies) include:  Quitting smoking totally, instead of slowly cutting back how much you smoke over a period of time.  Going to in-person counseling. You are more likely to quit if you go to many counseling sessions.  Using resources and support systems, such as: ? Online chats with a counselor. ? Phone quitlines. ? Printed self-help materials. ? Support groups or group counseling. ? Text messaging programs. ? Mobile phone apps or applications.  Taking medicines. Some of these medicines may have nicotine in them. If you are pregnant or breastfeeding, do not take any medicines to quit smoking unless your doctor says it is okay. Talk with your doctor about counseling or other things that can help you.  Talk with your doctor about using more than one strategy at the same time, such as taking medicines while you are also going to in-person counseling. This can help make  quitting easier. What things can I do to make it easier to quit? Quitting smoking might feel very hard at first, but there is a lot that you can do to make it easier. Take these steps:  Talk to your family and friends. Ask them to support and encourage you.  Call phone quitlines, reach out to support groups, or work with a counselor.  Ask people who smoke to not smoke around you.  Avoid places that make you want (trigger) to smoke, such as: ? Bars. ? Parties. ? Smoke-break areas at work.  Spend time with people who do not smoke.  Lower the stress in your life. Stress can make you want to smoke. Try these things to help your stress: ? Getting regular exercise. ? Deep-breathing exercises. ? Yoga. ? Meditating. ? Doing a body scan. To do this, close your eyes, focus on one area of your body at a time from head to toe, and notice which parts of your body are tense. Try to relax the muscles in those areas.  Download or buy apps on your mobile phone or tablet that can help you stick to your quit plan. There are many free apps, such as QuitGuide from the CDC (Centers for Disease Control and Prevention). You can find more support from smokefree.gov and other websites.  This information is not intended to replace advice given to you by your health care provider. Make sure you discuss any questions you have with your health care provider. Document Released: 03/25/2009 Document   Revised: 01/25/2016 Document Reviewed: 10/13/2014 Elsevier Interactive Patient Education  2018 Elsevier Inc.  

## 2017-08-08 ENCOUNTER — Other Ambulatory Visit: Payer: Self-pay | Admitting: Adult Health

## 2017-08-14 ENCOUNTER — Encounter (HOSPITAL_COMMUNITY): Payer: Self-pay | Admitting: Emergency Medicine

## 2017-08-14 ENCOUNTER — Emergency Department (HOSPITAL_COMMUNITY): Payer: BLUE CROSS/BLUE SHIELD

## 2017-08-14 ENCOUNTER — Emergency Department (HOSPITAL_COMMUNITY)
Admission: EM | Admit: 2017-08-14 | Discharge: 2017-08-14 | Disposition: A | Discharge status: Home / Self Care | Payer: BLUE CROSS/BLUE SHIELD | Attending: Emergency Medicine | Admitting: Emergency Medicine

## 2017-08-14 DIAGNOSIS — I10 Essential (primary) hypertension: Secondary | ICD-10-CM | POA: Diagnosis not present

## 2017-08-14 DIAGNOSIS — F17210 Nicotine dependence, cigarettes, uncomplicated: Secondary | ICD-10-CM | POA: Diagnosis not present

## 2017-08-14 DIAGNOSIS — Z79899 Other long term (current) drug therapy: Secondary | ICD-10-CM | POA: Diagnosis not present

## 2017-08-14 DIAGNOSIS — M25562 Pain in left knee: Secondary | ICD-10-CM | POA: Diagnosis not present

## 2017-08-14 DIAGNOSIS — J4520 Mild intermittent asthma, uncomplicated: Secondary | ICD-10-CM | POA: Insufficient documentation

## 2017-08-14 MED ORDER — HYDROCODONE-ACETAMINOPHEN 5-325 MG PO TABS: 1.0000 | ORAL_TABLET | ORAL | 0 refills | Status: DC | PRN

## 2017-08-14 MED ORDER — HYDROCODONE-ACETAMINOPHEN 5-325 MG PO TABS
1.0000 | ORAL_TABLET | Freq: Once | ORAL | Status: AC
  Administered 2017-08-14: 1 via ORAL
  Filled 2017-08-14: qty 1

## 2017-08-14 NOTE — Discharge Instructions (Signed)
Your xrays are negative for any acute injury from yesterdays fall.  Use ice and elevation as much as possible for the next several days to help reduce the swelling.  Take the medication prescribed.   This will make you drowsy - do not drive within 4 hours of taking this medication.    Call the orthopedic doctor listed for a recheck of your injury if not improving over the next 7-10 days.

## 2017-08-14 NOTE — ED Provider Notes (Signed)
[facility] EMERGENCY DEPARTMENT Provider Note   CSN: [id number] Arrival date & time: 08/14/17  7619     History   Chief Complaint Chief Complaint  Patient presents with  . Fall  . Knee Injury    HPI Judy Cross is a 52 y.o. female.  The history is provided by the patient.  Fall  This is a new (Pt slipped at the grocery store yesterday and fell with her left leg in extension in front of her. Pain in the knee since.  Has hx of arthritis in the knee, last steroid injection last week).) problem. The current episode started 12 to 24 hours ago. The problem occurs constantly. The problem has not changed since onset.Pertinent negatives include no chest pain and no shortness of breath. The symptoms are aggravated by bending, twisting and standing. The symptoms are relieved by rest. She has tried a cold compress (ibuprofen 400 mg) for the symptoms. The treatment provided no relief.    Past Medical History:  Diagnosis Date  . Allergy   . Arthritis   . Asthma in adult, mild intermittent, uncomplicated 10/18/3265  . Breast nodule 10/12/2015  . 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 Endometrial polyp 04/28/2013  . Fibroids 04/28/2013  . Hot flashes 03/26/2013  . Hypertension   . Irregular bleeding 03/26/2013  . Neuromuscular disorder (HCC)    sciatica  . Seasonal allergies   . Seizures ([place])    last seizure was 2 years ago; unknown etiology. On Keppra.    Patient Active Problem List   Diagnosis Date Noted  . Lumbar degenerative disc disease 01/12/2017  . Essential hypertension 12/07/2016  . Asthma in adult, mild intermittent, uncomplicated 12/45/8099  . Special screening for malignant neoplasms, colon 01/07/2016  . Breast nodule 10/12/2015  . Low back pain 12/08/2013  . Fibroids 04/28/2013  . Hot flashes 03/26/2013  . Enlarged uterus 03/26/2013    Past Surgical History:  Procedure Laterality Date  . ABLATION     with polyp removal.  . CESAREAN SECTION     x 3  .  COLONOSCOPY WITH PROPOFOL N/A 01/11/2016   Procedure: COLONOSCOPY WITH PROPOFOL;  Surgeon: Danie Binder, MD;  Location: [facility];  Service: Endoscopy;  Laterality: N/A;  800  . EXAMINATION UNDER ANESTHESIA  02/28/2012   Procedure: EXAM UNDER ANESTHESIA;  Surgeon: Donato Heinz, MD;  Location: [facility] ORS;  Service: General;  Laterality: N/A;  . GANGLION CYST EXCISION Left   . HYSTEROSCOPY W/D&C N/A 05/23/2013   Procedure: DILATATION AND CURETTAGE /HYSTEROSCOPY;  Surgeon: Jonnie Kind, MD;  Location: [facility] ORS;  Service: Gynecology;  Laterality: N/A;  . POLYPECTOMY N/A 05/23/2013   Procedure: ENDOMETRIAL POLYP REMOVAL;  Surgeon: Jonnie Kind, MD;  Location: [facility] ORS;  Service: Gynecology;  Laterality: N/A;  . SPHINCTEROTOMY  02/28/2012   Procedure: SPHINCTEROTOMY;  Surgeon: Donato Heinz, MD;  Location: [facility] ORS;  Service: General;  Laterality: N/A;  Lateral Internal Sphincterotomy  . TRIGGER FINGER RELEASE Left    ring finger  . TUBAL LIGATION      OB History    Gravida Para Term Preterm AB Living   4 3     1 3    SAB TAB Ectopic Multiple Live Births   1       3       Home Medications    Prior to Admission medications   Medication Sig Start Date End Date Taking? Authorizing Provider  albuterol (PROVENTIL HFA;VENTOLIN  HFA) 108 (90 BASE) MCG/ACT inhaler Inhale 1-2 puffs into the lungs every 6 (six) hours as needed for wheezing or shortness of breath.     [provider]  butalbital-acetaminophen-caffeine Emelda Brothers, ESGIC) [phone] MG tablet Take 1-2 tablets by mouth every 6 (six) hours as needed for headache. 03/09/17 03/09/18  Raylene Everts, MD  cyclobenzaprine (FLEXERIL) 10 MG tablet Take 1 tablet (10 mg total) by mouth at bedtime. 06/21/17   Raylene Everts, MD  hydrochlorothiazide (HYDRODIURIL) 25 MG tablet Take 1 tablet (25 mg total) by mouth daily. 12/07/16   Raylene Everts, MD  HYDROcodone-acetaminophen (NORCO/VICODIN) 5-325 MG tablet Take 1 tablet by mouth  every 4 (four) hours as needed. 08/14/17   Evalee Jefferson, PA-C  naproxen (NAPROSYN) 500 MG tablet Take 1 tablet (500 mg total) by mouth 2 (two) times daily with a meal. 07/03/17   [name], MD  predniSONE (DELTASONE) 20 MG tablet Take 1 tablet (20 mg total) by mouth daily with breakfast. 07/12/17   Raylene Everts, MD  tiZANidine (ZANAFLEX) 4 MG tablet  01/12/17   [provider]    Family History Family History  Problem Relation Age of Onset  . Hypertension Mother   . Arthritis Mother   . COPD Mother   . Diabetes Mother   . Heart disease Mother   . Hypertension Father   . Parkinson's disease Father   . Colon cancer Neg Hx     Social History Social History   Tobacco Use  . Smoking status: Current Some Day Smoker    Packs/day: 0.25    Years: 20.00    Pack years: 5.00    Types: Cigarettes  . Smokeless tobacco: Never Used  . Tobacco comment: smokes 1 cig daily  Substance Use Topics  . Alcohol use: Yes    Comment: occasional  . Drug use: No     Allergies   Lisinopril; Penicillins; and Tramadol   Review of Systems Review of Systems  Constitutional: Negative for fever.  Respiratory: Negative for shortness of breath.   Cardiovascular: Negative for chest pain.  Musculoskeletal: Positive for arthralgias. Negative for joint swelling and myalgias.  Neurological: Negative for weakness and numbness.     Physical Exam Updated Vital Signs BP (!) 152/105 (BP Location: Left Arm)   Pulse 65   Temp 98.1 F (36.7 C) (Oral)   Resp 16   Ht 5\' 2"  (1.575 m)   Wt 61.2 kg (135 lb)   SpO2 100%   BMI 24.69 kg/m   Physical Exam  Constitutional: She appears well-developed and well-nourished.  HENT:  Head: Atraumatic.  Neck: Normal range of motion.  Cardiovascular:  Pulses equal bilaterally  Musculoskeletal: She exhibits tenderness. She exhibits no edema or deformity.       Left hip: She exhibits tenderness. She exhibits no swelling, no crepitus and no deformity.        Left knee: She exhibits no swelling, no effusion, no ecchymosis, no deformity, normal alignment, no LCL laxity, normal patellar mobility and no MCL laxity. Tenderness found. Medial joint line and lateral joint line tenderness noted.  Neurological: She is alert. She has normal strength. She displays normal reflexes. No sensory deficit.  Skin: Skin is warm and dry.  Psychiatric: She has a normal mood and affect.     ED Treatments / Results  Labs (all labs ordered are listed, but only abnormal results are displayed) Labs Reviewed  POC URINE PREG, ED    EKG  EKG Interpretation  None       Radiology Dg Knee Complete 4 Views Left  Result Date: 08/14/2017 CLINICAL DATA:  Recent fall with diffuse knee pain EXAM: LEFT KNEE - COMPLETE 4+ VIEW COMPARISON:  Left knee films 02/28/2017 FINDINGS: The left knee joint spaces are well preserved. No acute fracture is seen. No definite joint effusion is seen. The patella is normally positioned. IMPRESSION: Negative. Electronically Signed   By: Ivar Drape M.D.   On: 08/14/2017 08:21   Dg Hip Unilat W Or Wo Pelvis 2-3 Views Left  Result Date: 08/14/2017 CLINICAL DATA:  Status post fall yesterday. Left hip pain. Initial encounter. EXAM: DG HIP (WITH OR WITHOUT PELVIS) 2-3V LEFT COMPARISON:  Plain films left hip 02/28/2017. FINDINGS: There is no evidence of hip fracture or dislocation. There is no evidence of arthropathy or other focal bone abnormality about the left hip. 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 IMPRESSION: Negative left hip. Electronically Signed   By: Inge Rise M.D.   On: 08/14/2017 09:10    Procedures Procedures (including critical care time)  Medications Ordered in ED Medications  HYDROcodone-acetaminophen (NORCO/VICODIN) 5-325 MG per tablet 1 tablet (1 tablet Oral Given 08/14/17 0914)     Initial Impression / Assessment and Plan / ED Course  I have reviewed the triage vital signs and the nursing  notes.  Pertinent labs & imaging results that were available during my care of the patient were reviewed by me and considered in my medical decision making (see chart for details).     Imaging reviewed and discussed with patient.  Advised rest ice compression elevation.  She was prescribed hydrocodone in addition to continuing her ibuprofen.  Plan PRN follow-up with Dr. Luna Glasgow if symptoms are not improving over the next week.  [place] was consulted prior to prescribing hydrocodone.  Final Clinical Impressions(s) / ED Diagnoses   Final diagnoses:  Acute pain of left knee    ED Discharge Orders        Ordered    HYDROcodone-acetaminophen (NORCO/VICODIN) 5-325 MG tablet  Every 4 hours PRN     08/14/17 0959       Evalee Jefferson, PA-C 08/14/17 [place], [place], DO 08/16/17 2205

## 2017-08-14 NOTE — ED Triage Notes (Signed)
Pt reports she slipped and fell at [employer] yesterday and hurt her left knee.

## 2017-08-20 ENCOUNTER — Encounter: Payer: Self-pay | Admitting: Family Medicine

## 2017-09-10 ENCOUNTER — Other Ambulatory Visit: Payer: Self-pay | Admitting: Adult Health

## 2017-10-08 ENCOUNTER — Other Ambulatory Visit: Payer: Self-pay | Admitting: Adult Health

## 2017-10-31 ENCOUNTER — Ambulatory Visit: Payer: Self-pay | Admitting: Orthopaedic Surgery

## 2017-11-01 ENCOUNTER — Ambulatory Visit: Payer: BLUE CROSS/BLUE SHIELD | Admitting: Orthopaedic Surgery

## 2017-11-01 ENCOUNTER — Encounter: Payer: Self-pay | Admitting: Orthopaedic Surgery

## 2017-11-03 ENCOUNTER — Other Ambulatory Visit: Payer: Self-pay

## 2017-11-03 ENCOUNTER — Emergency Department (HOSPITAL_COMMUNITY)
Admission: EM | Admit: 2017-11-03 | Discharge: 2017-11-03 | Disposition: A | Discharge status: Home / Self Care | Payer: BLUE CROSS/BLUE SHIELD | Attending: Emergency Medicine | Admitting: Emergency Medicine

## 2017-11-03 ENCOUNTER — Encounter (HOSPITAL_COMMUNITY): Payer: Self-pay

## 2017-11-03 DIAGNOSIS — M25562 Pain in left knee: Secondary | ICD-10-CM

## 2017-11-03 DIAGNOSIS — I10 Essential (primary) hypertension: Secondary | ICD-10-CM | POA: Diagnosis not present

## 2017-11-03 DIAGNOSIS — J4520 Mild intermittent asthma, uncomplicated: Secondary | ICD-10-CM | POA: Diagnosis not present

## 2017-11-03 DIAGNOSIS — F17210 Nicotine dependence, cigarettes, uncomplicated: Secondary | ICD-10-CM | POA: Diagnosis not present

## 2017-11-03 DIAGNOSIS — Z79899 Other long term (current) drug therapy: Secondary | ICD-10-CM | POA: Insufficient documentation

## 2017-11-03 MED ORDER — PREDNISONE 10 MG PO TABS: 20.0000 mg | ORAL_TABLET | Freq: Two times a day (BID) | ORAL | 0 refills | Status: DC

## 2017-11-03 MED ORDER — HYDROCODONE-ACETAMINOPHEN 5-325 MG PO TABS: 1.0000 | ORAL_TABLET | Freq: Four times a day (QID) | ORAL | 0 refills | Status: DC | PRN

## 2017-11-03 NOTE — ED Provider Notes (Signed)
[facility] EMERGENCY DEPARTMENT Provider Note   CSN: [id number] Arrival date & time: 11/03/17  0540     History   Chief Complaint Chief Complaint  Patient presents with  . Knee Pain    x 1 month ago    HPI Judy Cross is a 52 y.o. female.  Patient is a 52 year old female with a one-month history of left knee pain.  She reports slipping and falling in a grocery store.  She has been seen by urgent care and orthopedics.  She has had steroid injections, medications, however her knee is not improving.  She denies any fevers or chills.  She denies any new injury or trauma.  The history is provided by the patient.  Knee Pain   This is a new problem. Episode onset: 1 month ago. The problem occurs constantly. The problem has not changed since onset.The pain is moderate. Pertinent negatives include no numbness.    Past Medical History:  Diagnosis Date  . Allergy   . Arthritis   . Asthma in adult, mild intermittent, uncomplicated 4/74/2595  . Breast nodule 10/12/2015  . 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 Endometrial polyp 04/28/2013  . Fibroids 04/28/2013  . Hot flashes 03/26/2013  . Hypertension   . Irregular bleeding 03/26/2013  . Neuromuscular disorder (HCC)    sciatica  . Seasonal allergies   . Seizures ([place])    last seizure was 2 years ago; unknown etiology. On Keppra.    Patient Active Problem List   Diagnosis Date Noted  . Lumbar degenerative disc disease 01/12/2017  . Essential hypertension 12/07/2016  . Asthma in adult, mild intermittent, uncomplicated 63/87/5643  . Special screening for malignant neoplasms, colon 01/07/2016  . Breast nodule 10/12/2015  . Low back pain 12/08/2013  . Fibroids 04/28/2013  . Hot flashes 03/26/2013  . Enlarged uterus 03/26/2013    Past Surgical History:  Procedure Laterality Date  . ABLATION     with polyp removal.  . CESAREAN SECTION     x 3  . COLONOSCOPY WITH PROPOFOL N/A 01/11/2016   Procedure: COLONOSCOPY  WITH PROPOFOL;  Surgeon: Danie Binder, MD;  Location: [facility];  Service: Endoscopy;  Laterality: N/A;  800  . EXAMINATION UNDER ANESTHESIA  02/28/2012   Procedure: EXAM UNDER ANESTHESIA;  Surgeon: Donato Heinz, MD;  Location: [facility] ORS;  Service: General;  Laterality: N/A;  . GANGLION CYST EXCISION Left   . HYSTEROSCOPY W/D&C N/A 05/23/2013   Procedure: DILATATION AND CURETTAGE /HYSTEROSCOPY;  Surgeon: Jonnie Kind, MD;  Location: [facility] ORS;  Service: Gynecology;  Laterality: N/A;  . POLYPECTOMY N/A 05/23/2013   Procedure: ENDOMETRIAL POLYP REMOVAL;  Surgeon: Jonnie Kind, MD;  Location: [facility] ORS;  Service: Gynecology;  Laterality: N/A;  . SPHINCTEROTOMY  02/28/2012   Procedure: SPHINCTEROTOMY;  Surgeon: Donato Heinz, MD;  Location: [facility] ORS;  Service: General;  Laterality: N/A;  Lateral Internal Sphincterotomy  . TRIGGER FINGER RELEASE Left    ring finger  . TUBAL LIGATION       OB History    Gravida  4   Para  3   Term      Preterm      AB  1   Living  3     SAB  1   TAB      Ectopic      Multiple      Live Births  3  Home Medications    Prior to Admission medications   Medication Sig Start Date End Date Taking? Authorizing Provider  albuterol (PROVENTIL HFA;VENTOLIN HFA) 108 (90 BASE) MCG/ACT inhaler Inhale 1-2 puffs into the lungs every 6 (six) hours as needed for wheezing or shortness of breath.     [provider]  butalbital-acetaminophen-caffeine Emelda Brothers, ESGIC) [phone] MG tablet Take 1-2 tablets by mouth every 6 (six) hours as needed for headache. 03/09/17 03/09/18  Raylene Everts, MD  cyclobenzaprine (FLEXERIL) 10 MG tablet Take 1 tablet (10 mg total) by mouth at bedtime. 06/21/17   Raylene Everts, MD  hydrochlorothiazide (HYDRODIURIL) 25 MG tablet Take 1 tablet (25 mg total) by mouth daily. 12/07/16   Raylene Everts, MD  HYDROcodone-acetaminophen (NORCO/VICODIN) 5-325 MG tablet Take 1 tablet by mouth every 4 (four)  hours as needed. 08/14/17   Evalee Jefferson, PA-C  naproxen (NAPROSYN) 500 MG tablet Take 1 tablet (500 mg total) by mouth 2 (two) times daily with a meal. 07/03/17   [name], MD  predniSONE (DELTASONE) 20 MG tablet Take 1 tablet (20 mg total) by mouth daily with breakfast. 07/12/17   Raylene Everts, MD  tiZANidine (ZANAFLEX) 4 MG tablet  01/12/17   [provider]    Family History Family History  Problem Relation Age of Onset  . Hypertension Mother   . Arthritis Mother   . COPD Mother   . Diabetes Mother   . Heart disease Mother   . Hypertension Father   . Parkinson's disease Father   . Colon cancer Neg Hx     Social History Social History   Tobacco Use  . Smoking status: Current Some Day Smoker    Packs/day: 0.25    Years: 20.00    Pack years: 5.00    Types: Cigarettes  . Smokeless tobacco: Never Used  . Tobacco comment: smokes 1 cig daily  Substance Use Topics  . Alcohol use: Yes    Comment: occasional  . Drug use: No     Allergies   Lisinopril; Penicillins; and Tramadol   Review of Systems Review of Systems  Neurological: Negative for numbness.  All other systems reviewed and are negative.    Physical Exam Updated Vital Signs BP (!) 148/101 (BP Location: Left Arm)   Pulse 63   Temp 98.2 F (36.8 C) (Oral)   Resp 20   Ht 5\' 1"  (1.549 m)   Wt 64.9 kg (143 lb)   SpO2 100%   BMI 27.02 kg/m   Physical Exam  Constitutional: She is oriented to person, place, and time. She appears well-developed and well-nourished. No distress.  HENT:  Head: Normocephalic and atraumatic.  Neck: Normal range of motion. Neck supple.  Pulmonary/Chest: Effort normal.  Musculoskeletal: Normal range of motion.  The left knee appears grossly normal.  I appreciate no effusion.  She has good range of motion with no crepitus.  Anterior and posterior drawer tests are negative.  There is no laxity with varus or valgus stress.  Neurological: She is alert and oriented to  person, place, and time.  Skin: She is not diaphoretic.  Nursing note and vitals reviewed.    ED Treatments / Results  Labs (all labs ordered are listed, but only abnormal results are displayed) Labs Reviewed - No data to display  EKG None  Radiology No results found.  Procedures Procedures (including critical care time)  Medications Ordered in ED Medications - No data to display   Initial Impression /  Assessment and Plan / ED Course  I have reviewed the triage vital signs and the nursing notes.  Pertinent labs & imaging results that were available during my care of the patient were reviewed by me and considered in my medical decision making (see chart for details).  I am uncertain as to why this patient's knee is not improving despite the therapy that has been recommended.  Her exam is basically unremarkable.  There is no effusion and no evidence for ligamentous instability.  I see no reason to perform additional x-rays.  She will be treated with a course of steroids, pain medicine, and is to follow-up with orthopedics if she is not getting better.  Final Clinical Impressions(s) / ED Diagnoses   Final diagnoses:  None    ED Discharge Orders    None       Veryl Speak, MD 11/03/17 [phone]

## 2017-11-03 NOTE — Discharge Instructions (Addendum)
Prednisone as prescribed.  Hydrocodone as prescribed as needed for pain.  Follow-up with orthopedics if your symptoms are not improving in the next week.

## 2017-11-03 NOTE — ED Triage Notes (Signed)
Pt states she slipped on wet floors at work a month ago, has had x-rays and doing therapy.  Pt reports pain has flared up over the past several days, taking alleve without relief.

## 2017-11-15 ENCOUNTER — Encounter: Payer: Self-pay | Admitting: Orthopaedic Surgery

## 2017-11-15 ENCOUNTER — Ambulatory Visit (INDEPENDENT_AMBULATORY_CARE_PROVIDER_SITE_OTHER): Payer: BLUE CROSS/BLUE SHIELD | Admitting: Orthopaedic Surgery

## 2017-11-15 VITALS — BP 121/85 | HR 85 | Ht 61.0 in | Wt 128.0 lb

## 2017-11-15 DIAGNOSIS — G8929 Other chronic pain: Secondary | ICD-10-CM | POA: Diagnosis not present

## 2017-11-15 DIAGNOSIS — M25562 Pain in left knee: Secondary | ICD-10-CM

## 2017-11-15 MED ORDER — NAPROXEN 500 MG PO TABS: 500.0000 mg | ORAL_TABLET | Freq: Two times a day (BID) | ORAL | 5 refills | Status: DC

## 2017-11-15 MED ORDER — HYDROCODONE-ACETAMINOPHEN 5-325 MG PO TABS: ORAL_TABLET | ORAL | 0 refills | Status: DC

## 2017-11-15 NOTE — Progress Notes (Signed)
Patient Judy Cross, female DOB:03/03/1966, 52 y.o. FGH:[id number]  Chief Complaint  Patient presents with  . Follow-up    Left Knee    HPI  Judy Cross is a 52 y.o. female who has continued pain of the left knee.  She went to the ER in March and again last month because of giving way of the left knee and knee pain.  She has swelling and popping.  She went to Urgent Care here in town and had a MRI done in [place] in April.  I will call their office and get a copy of the MRI report.  She says she is tired of her knee hurting and giving way.  She wants something done for it.  MRI was negative for meniscus or ligamentous injury.  She has some slight thinning of articular cartilage laterally and slight quad tendinitis.  I have explained the findings to her.  She does not need surgery.  HPI  Body mass index is 24.19 kg/m.  ROS  Review of Systems  HENT: Negative for congestion.   Respiratory: Positive for shortness of breath. Negative for cough.   Cardiovascular: Negative for chest pain and leg swelling.  Endocrine: Negative for cold intolerance.  Musculoskeletal: Positive for arthralgias and gait problem.  Allergic/Immunologic: Negative for environmental allergies.  All other systems reviewed and are negative.   Past Medical History:  Diagnosis Date  . Allergy   . Arthritis   . Asthma in adult, mild intermittent, uncomplicated 6/78/9381  . Breast nodule 10/12/2015  . 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 Endometrial polyp 04/28/2013  . Fibroids 04/28/2013  . Hot flashes 03/26/2013  . Hypertension   . Irregular bleeding 03/26/2013  . Neuromuscular disorder (HCC)    sciatica  . Seasonal allergies   . Seizures ([place])    last seizure was 2 years ago; unknown etiology. On Keppra.    Past Surgical History:  Procedure Laterality Date  . ABLATION     with polyp removal.  . CESAREAN SECTION     x 3  . COLONOSCOPY WITH PROPOFOL N/A 01/11/2016   Procedure:  COLONOSCOPY WITH PROPOFOL;  Surgeon: Danie Binder, MD;  [facility];  Service: Endoscopy;  Laterality: N/A;  800  . EXAMINATION UNDER ANESTHESIA  02/28/2012   Procedure: EXAM UNDER ANESTHESIA;  Surgeon: Donato Heinz, MD;  Location: [facility] ORS;  Service: General;  Laterality: N/A;  . GANGLION CYST EXCISION Left   . HYSTEROSCOPY W/D&C N/A 05/23/2013   Procedure: DILATATION AND CURETTAGE /HYSTEROSCOPY;  Surgeon: Jonnie Kind, MD;  Location: [facility] ORS;  Service: Gynecology;  Laterality: N/A;  . POLYPECTOMY N/A 05/23/2013   Procedure: ENDOMETRIAL POLYP REMOVAL;  Surgeon: Jonnie Kind, MD;  Location: [facility];  Service: Gynecology;  Laterality: N/A;  . SPHINCTEROTOMY  02/28/2012   Procedure: SPHINCTEROTOMY;  Surgeon: Donato Heinz, MD;  Location: [facility] ORS;  Service: General;  Laterality: N/A;  Lateral Internal Sphincterotomy  . TRIGGER FINGER RELEASE Left    ring finger  . TUBAL LIGATION      Family History  Problem Relation Age of Onset  . Hypertension Mother   . Arthritis Mother   . COPD Mother   . Diabetes Mother   . Heart disease Mother   . Hypertension Father   . Parkinson's disease Father   . Colon cancer Neg Hx     Social History Social History   Tobacco Use  . Smoking status: Current Some Day Smoker  Packs/day: 0.25    Years: 20.00    Pack years: 5.00    Types: Cigarettes  . Smokeless tobacco: Never Used  . Tobacco comment: smokes 1 cig daily  Substance Use Topics  . Alcohol use: Yes    Comment: occasional  . Drug use: No    Allergies  Allergen Reactions  . Lisinopril Swelling    swelling to entire mouth.  . Penicillins Rash    Has patient had a PCN reaction causing immediate rash, facial/tongue/throat swelling, SOB or lightheadedness with hypotension: no - next day Has patient had a PCN reaction causing severe rash involving mucus membranes or skin necrosis: No Has patient had a PCN reaction that required hospitalization No Has patient had a PCN  reaction occurring within the last 10 years: Yes If all of the above answers are "NO", then may proceed with Cephalosporin use.   . Tramadol Other (See Comments)    dizziness    Current Outpatient Medications  Medication Sig Dispense Refill  . albuterol (PROVENTIL HFA;VENTOLIN HFA) 108 (90 BASE) MCG/ACT inhaler Inhale 1-2 puffs into the lungs every 6 (six) hours as needed for wheezing or shortness of breath.     . butalbital-acetaminophen-caffeine (FIORICET, ESGIC) 50-325-40 MG tablet Take 1-2 tablets by mouth every 6 (six) hours as needed for headache. 20 tablet 0  . cyclobenzaprine (FLEXERIL) 10 MG tablet Take 1 tablet (10 mg total) by mouth at bedtime. 30 tablet 0  . hydrochlorothiazide (HYDRODIURIL) 25 MG tablet Take 1 tablet (25 mg total) by mouth daily. 90 tablet 3  . HYDROcodone-acetaminophen (NORCO) 5-325 MG tablet Take 1-2 tablets by mouth every 6 (six) hours as needed. 15 tablet 0  . naproxen (NAPROSYN) 500 MG tablet Take 1 tablet (500 mg total) by mouth 2 (two) times daily with a meal. 60 tablet 5  . predniSONE (DELTASONE) 10 MG tablet Take 2 tablets (20 mg total) by mouth 2 (two) times daily. 20 tablet 0  . tiZANidine (ZANAFLEX) 4 MG tablet   0   No current facility-administered medications for this visit.      Physical Exam  Blood pressure 121/85, pulse 85, height 5\' 1"  (1.549 m), weight 128 lb (58.1 kg).  Constitutional: overall normal hygiene, normal nutrition, well developed, normal grooming, normal body habitus. Assistive device:none  Musculoskeletal: gait and station Limp left, muscle tone and strength are normal, no tremors or atrophy is present.  .  Neurological: coordination overall normal.  Deep tendon reflex/nerve stretch intact.  Sensation normal.  Cranial nerves II-XII intact.   Skin:   Normal overall no scars, lesions, ulcers or rashes. No psoriasis.  Psychiatric: Alert and oriented x 3.  Recent memory intact, remote memory unclear.  Normal mood and  affect. Well groomed.  Good eye contact.  Cardiovascular: overall no swelling, no varicosities, no edema bilaterally, normal temperatures of the legs and arms, no clubbing, cyanosis and good capillary refill.  Lymphatic: palpation is normal.  Left knee has effusion 1+, ROM 0 to 105 with medial pain, crepitus, limp to the left, positive medial [place], muscle tone and strength are normal.  All other systems reviewed and are negative   The patient has been educated about the nature of the problem(s) and counseled on treatment options.  The patient appeared to understand what I have discussed and is in agreement with it.  No diagnosis found.  PLAN Call if any problems.  Precautions discussed.  Continue current medications.   Return to clinic 3 weeks   I have  reviewed the [place] Controlled Substance Reporting System web site prior to prescribing narcotic medicine for this patient.  Electronically Signed [name], MD 6/6/20192:40 PM

## 2017-12-06 ENCOUNTER — Ambulatory Visit (INDEPENDENT_AMBULATORY_CARE_PROVIDER_SITE_OTHER): Payer: BLUE CROSS/BLUE SHIELD | Admitting: Orthopaedic Surgery

## 2017-12-06 ENCOUNTER — Encounter: Payer: Self-pay | Admitting: Orthopaedic Surgery

## 2017-12-06 VITALS — BP 142/96 | HR 73 | Temp 97.5°F | Ht 61.0 in | Wt 132.0 lb

## 2017-12-06 DIAGNOSIS — G8929 Other chronic pain: Secondary | ICD-10-CM

## 2017-12-06 DIAGNOSIS — F17210 Nicotine dependence, cigarettes, uncomplicated: Secondary | ICD-10-CM | POA: Diagnosis not present

## 2017-12-06 DIAGNOSIS — M25562 Pain in left knee: Secondary | ICD-10-CM

## 2017-12-06 MED ORDER — HYDROCODONE-ACETAMINOPHEN 5-325 MG PO TABS: ORAL_TABLET | ORAL | 0 refills | Status: DC

## 2017-12-06 NOTE — Progress Notes (Signed)
Patient Judy Cross, female DOB:[birth year], 52 y.o. NLZ:[id number]  Chief Complaint  Patient presents with  . Knee Pain    left    HPI  Judy Cross is a 52 y.o. female who has pain of the left knee.  She has good and bad days.  She has popping and swelling.  She has no giving way. She is taking her medicine.  She has cut way back on her smoking. HPI  Body mass index is 24.94 kg/m.  ROS  Review of Systems  HENT: Negative for congestion.   Respiratory: Positive for shortness of breath. Negative for cough.   Cardiovascular: Negative for chest pain and leg swelling.  Endocrine: Negative for cold intolerance.  Musculoskeletal: Positive for arthralgias and gait problem.  Allergic/Immunologic: Negative for environmental allergies.  All other systems reviewed and are negative.   Past Medical History:  Diagnosis Date  . Allergy   . Arthritis   . Asthma in adult, mild intermittent, uncomplicated 02/12/4096  . Breast nodule 10/12/2015  . 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 Endometrial polyp 04/28/2013  . Fibroids 04/28/2013  . Hot flashes 03/26/2013  . Hypertension   . Irregular bleeding 03/26/2013  . Neuromuscular disorder (HCC)    sciatica  . Seasonal allergies   . Seizures ([place])    last seizure was 2 years ago; unknown etiology. On Keppra.    Past Surgical History:  Procedure Laterality Date  . ABLATION     with polyp removal.  . CESAREAN SECTION     x 3  . COLONOSCOPY WITH PROPOFOL N/A 01/11/2016   Procedure: COLONOSCOPY WITH PROPOFOL;  Surgeon: Danie Binder, MD;  [facility];  Service: Endoscopy;  Laterality: N/A;  800  . EXAMINATION UNDER ANESTHESIA  02/28/2012   Procedure: EXAM UNDER ANESTHESIA;  Surgeon: Donato Heinz, MD;  Location: [facility];  Service: General;  Laterality: N/A;  . GANGLION CYST EXCISION Left   . HYSTEROSCOPY W/D&C N/A 05/23/2013   Procedure: DILATATION AND CURETTAGE /HYSTEROSCOPY;  Surgeon: Jonnie Kind, MD;   Location: [facility];  Service: Gynecology;  Laterality: N/A;  . POLYPECTOMY N/A 05/23/2013   Procedure: ENDOMETRIAL POLYP REMOVAL;  Surgeon: Jonnie Kind, MD;  Location: [facility];  Service: Gynecology;  Laterality: N/A;  . SPHINCTEROTOMY  02/28/2012   Procedure: SPHINCTEROTOMY;  Surgeon: Donato Heinz, MD;  Location: [facility];  Service: General;  Laterality: N/A;  Lateral Internal Sphincterotomy  . TRIGGER FINGER RELEASE Left    ring finger  . TUBAL LIGATION      Family History  Problem Relation Age of Onset  . Hypertension Mother   . Arthritis Mother   . COPD Mother   . Diabetes Mother   . Heart disease Mother   . Hypertension Father   . Parkinson's disease Father   . Colon cancer Neg Hx     Social History Social History   Tobacco Use  . Smoking status: Current Some Day Smoker    Packs/day: 0.25    Years: 20.00    Pack years: 5.00    Types: Cigarettes  . Smokeless tobacco: Never Used  . Tobacco comment: smokes 1 cig daily  Substance Use Topics  . Alcohol use: Yes    Comment: occasional  . Drug use: No    Allergies  Allergen Reactions  . Lisinopril Swelling    swelling to entire mouth.  . Penicillins Rash    Has patient had a PCN reaction causing immediate  rash, facial/tongue/throat swelling, SOB or lightheadedness with hypotension: no - next day Has patient had a PCN reaction causing severe rash involving mucus membranes or skin necrosis: No Has patient had a PCN reaction that required hospitalization No Has patient had a PCN reaction occurring within the last 10 years: Yes If all of the above answers are "NO", then may proceed with Cephalosporin use.   . Tramadol Other (See Comments)    dizziness    Current Outpatient Medications  Medication Sig Dispense Refill  . albuterol (PROVENTIL HFA;VENTOLIN HFA) 108 (90 BASE) MCG/ACT inhaler Inhale 1-2 puffs into the lungs every 6 (six) hours as needed for wheezing or shortness of breath.     .  butalbital-acetaminophen-caffeine (FIORICET, ESGIC) 50-325-40 MG tablet Take 1-2 tablets by mouth every 6 (six) hours as needed for headache. 20 tablet 0  . cyclobenzaprine (FLEXERIL) 10 MG tablet Take 1 tablet (10 mg total) by mouth at bedtime. 30 tablet 0  . hydrochlorothiazide (HYDRODIURIL) 25 MG tablet Take 1 tablet (25 mg total) by mouth daily. 90 tablet 3  . HYDROcodone-acetaminophen (NORCO/VICODIN) 5-325 MG tablet One tablet every six hours for pain.  Limit 7 days. 28 tablet 0  . naproxen (NAPROSYN) 500 MG tablet Take 1 tablet (500 mg total) by mouth 2 (two) times daily with a meal. 60 tablet 5  . predniSONE (DELTASONE) 10 MG tablet Take 2 tablets (20 mg total) by mouth 2 (two) times daily. 20 tablet 0  . tiZANidine (ZANAFLEX) 4 MG tablet   0   No current facility-administered medications for this visit.      Physical Exam  Blood pressure (!) 142/96, pulse 73, temperature (!) 97.5 F (36.4 C), height 5\' 1"  (1.549 m), weight 132 lb (59.9 kg).  Constitutional: overall normal hygiene, normal nutrition, well developed, normal grooming, normal body habitus. Assistive device:none  Musculoskeletal: gait and station Limp left, muscle tone and strength are normal, no tremors or atrophy is present.  .  Neurological: coordination overall normal.  Deep tendon reflex/nerve stretch intact.  Sensation normal.  Cranial nerves II-XII intact.   Skin:   Normal overall no scars, lesions, ulcers or rashes. No psoriasis.  Psychiatric: Alert and oriented x 3.  Recent memory intact, remote memory unclear.  Normal mood and affect. Well groomed.  Good eye contact.  Cardiovascular: overall no swelling, no varicosities, no edema bilaterally, normal temperatures of the legs and arms, no clubbing, cyanosis and good capillary refill.  Lymphatic: palpation is normal.  Left knee has slight effusion, ROM 0 to 110, stable knee, crepitus present, NV intact.  Slight limp to the left.  Muscle strength and tone are  normal.  All other systems reviewed and are negative   The patient has been educated about the nature of the problem(s) and counseled on treatment options.  The patient appeared to understand what I have discussed and is in agreement with it.  Encounter Diagnoses  Name Primary?  . Chronic pain of left knee Yes  . Cigarette nicotine dependence without complication     PLAN Call if any problems.  Precautions discussed.  Continue current medications.   Return to clinic 3 months  She has signed pain agreement.  I have reviewed the [place] web site prior to prescribing narcotic medicine for this patient.  Electronically Signed [name], MD 6/27/20191:42 PM

## 2017-12-06 NOTE — Patient Instructions (Signed)
Steps to Quit Smoking Smoking tobacco can be bad for your health. It can also affect almost every organ in your body. Smoking puts you and people around you at risk for many serious long-lasting (chronic) diseases. Quitting smoking is hard, but it is one of the best things that you can do for your health. It is never too late to quit. What are the benefits of quitting smoking? When you quit smoking, you lower your risk for getting serious diseases and conditions. They can include:  Lung cancer or lung disease.  Heart disease.  Stroke.  Heart attack.  Not being able to have children (infertility).  Weak bones (osteoporosis) and broken bones (fractures).  If you have coughing, wheezing, and shortness of breath, those symptoms may get better when you quit. You may also get sick less often. If you are pregnant, quitting smoking can help to lower your chances of having a baby of low birth weight. What can I do to help me quit smoking? Talk with your doctor about what can help you quit smoking. Some things you can do (strategies) include:  Quitting smoking totally, instead of slowly cutting back how much you smoke over a period of time.  Going to in-person counseling. You are more likely to quit if you go to many counseling sessions.  Using resources and support systems, such as: ? Online chats with a counselor. ? Phone quitlines. ? Printed self-help materials. ? Support groups or group counseling. ? Text messaging programs. ? Mobile phone apps or applications.  Taking medicines. Some of these medicines may have nicotine in them. If you are pregnant or breastfeeding, do not take any medicines to quit smoking unless your doctor says it is okay. Talk with your doctor about counseling or other things that can help you.  Talk with your doctor about using more than one strategy at the same time, such as taking medicines while you are also going to in-person counseling. This can help make  quitting easier. What things can I do to make it easier to quit? Quitting smoking might feel very hard at first, but there is a lot that you can do to make it easier. Take these steps:  Talk to your family and friends. Ask them to support and encourage you.  Call phone quitlines, reach out to support groups, or work with a counselor.  Ask people who smoke to not smoke around you.  Avoid places that make you want (trigger) to smoke, such as: ? Bars. ? Parties. ? Smoke-break areas at work.  Spend time with people who do not smoke.  Lower the stress in your life. Stress can make you want to smoke. Try these things to help your stress: ? Getting regular exercise. ? Deep-breathing exercises. ? Yoga. ? Meditating. ? Doing a body scan. To do this, close your eyes, focus on one area of your body at a time from head to toe, and notice which parts of your body are tense. Try to relax the muscles in those areas.  Download or buy apps on your mobile phone or tablet that can help you stick to your quit plan. There are many free apps, such as QuitGuide from the CDC (Centers for Disease Control and Prevention). You can find more support from smokefree.gov and other websites.  This information is not intended to replace advice given to you by your health care provider. Make sure you discuss any questions you have with your health care provider. Document Released: 03/25/2009 Document   Revised: 01/25/2016 Document Reviewed: 10/13/2014 Elsevier Interactive Patient Education  2018 Elsevier Inc.  

## 2017-12-31 ENCOUNTER — Telehealth: Payer: Self-pay | Admitting: Orthopaedic Surgery

## 2017-12-31 NOTE — Telephone Encounter (Signed)
Hydrocodone-Acetaminophen 5/325 mg   Qty 28 tablets  PATIENT USES [facility] 

## 2018-01-02 MED ORDER — HYDROCODONE-ACETAMINOPHEN 5-325 MG PO TABS: ORAL_TABLET | ORAL | 0 refills | Status: DC

## 2018-01-11 ENCOUNTER — Telehealth: Payer: Self-pay | Admitting: Orthopaedic Surgery

## 2018-01-11 NOTE — Telephone Encounter (Signed)
Hydrocodone-Acetaminophen  5/325 MG  Qty  28 Tablets  PATIENT USES [facility]

## 2018-01-14 MED ORDER — HYDROCODONE-ACETAMINOPHEN 5-325 MG PO TABS: ORAL_TABLET | ORAL | 0 refills | Status: DC

## 2018-01-22 ENCOUNTER — Telehealth: Payer: Self-pay | Admitting: Orthopaedic Surgery

## 2018-01-22 MED ORDER — HYDROCODONE-ACETAMINOPHEN 5-325 MG PO TABS: ORAL_TABLET | ORAL | 0 refills | Status: DC

## 2018-01-22 NOTE — Telephone Encounter (Signed)
Hydrocodone-Acetaminophen 5/325mg  Qty 28 Tablets  PATIENT USES [facility] 

## 2018-02-01 ENCOUNTER — Telehealth: Payer: Self-pay | Admitting: Orthopaedic Surgery

## 2018-02-01 NOTE — Telephone Encounter (Signed)
Hydrocodone-Acetaminophen  5/325 mg  Qty 25 Tablets  PATIENT USES [facility]

## 2018-02-04 MED ORDER — HYDROCODONE-ACETAMINOPHEN 5-325 MG PO TABS: ORAL_TABLET | ORAL | 0 refills | Status: DC

## 2018-02-12 ENCOUNTER — Telehealth: Payer: Self-pay | Admitting: Orthopaedic Surgery

## 2018-02-12 NOTE — Telephone Encounter (Signed)
Hydrocodone-Acetaminophen 5/325 mg  Qty 20 Tablets  PATIENT USES [facility]

## 2018-02-13 MED ORDER — HYDROCODONE-ACETAMINOPHEN 5-325 MG PO TABS: ORAL_TABLET | ORAL | 0 refills | Status: DC

## 2018-02-19 ENCOUNTER — Telehealth: Payer: Self-pay | Admitting: Orthopaedic Surgery

## 2018-02-19 NOTE — Telephone Encounter (Signed)
No more narcotics.  Take Advil, Aleve or Tylenol. 

## 2018-02-19 NOTE — Telephone Encounter (Signed)
Patient requests refill on Hydrocodone/Acetaminophen 5-325 mgs.  Qty  15       Sig: One tablet every six hours for pain.    [facility] [place]

## 2018-02-20 ENCOUNTER — Ambulatory Visit (INDEPENDENT_AMBULATORY_CARE_PROVIDER_SITE_OTHER): Payer: BLUE CROSS/BLUE SHIELD | Admitting: Orthopaedic Surgery

## 2018-02-20 ENCOUNTER — Encounter: Payer: Self-pay | Admitting: Orthopaedic Surgery

## 2018-02-20 VITALS — BP 156/108 | HR 56 | Ht 61.0 in | Wt 128.0 lb

## 2018-02-20 DIAGNOSIS — M25562 Pain in left knee: Secondary | ICD-10-CM | POA: Diagnosis not present

## 2018-02-20 DIAGNOSIS — G8929 Other chronic pain: Secondary | ICD-10-CM

## 2018-02-20 DIAGNOSIS — F17210 Nicotine dependence, cigarettes, uncomplicated: Secondary | ICD-10-CM | POA: Diagnosis not present

## 2018-02-20 NOTE — Progress Notes (Signed)
Patient Judy Cross, female DOB:05-07-1966, 52 y.o. TIR:[id number]  Chief Complaint  Patient presents with  . Knee Pain    Back of left knee with tingling down leg    HPI  Judy Cross is a 52 y.o. female who has pain of the left knee.  She has some posterior swelling.  She has no new trauma.  She has no redness or giving way. She is taking Naprosyn.  I told her I will not give more narcotics at this time.   Body mass index is 24.19 kg/m.  ROS  Review of Systems  HENT: Negative for congestion.   Respiratory: Positive for shortness of breath. Negative for cough.   Cardiovascular: Negative for chest pain and leg swelling.  Endocrine: Negative for cold intolerance.  Musculoskeletal: Positive for arthralgias and gait problem.  Allergic/Immunologic: Negative for environmental allergies.  All other systems reviewed and are negative.   All other systems reviewed and are negative.  The following is a summary of the past history medically, past history surgically, known current medicines, social history and family history.  This information is gathered electronically by the computer from prior information and documentation.  I review this each visit and have found including this information at this point in the chart is beneficial and informative.    Past Medical History:  Diagnosis Date  . Allergy   . Arthritis   . Asthma in adult, mild intermittent, uncomplicated 6/76/1950  . Breast nodule 10/12/2015  . 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 Endometrial polyp 04/28/2013  . Fibroids 04/28/2013  . Hot flashes 03/26/2013  . Hypertension   . Irregular bleeding 03/26/2013  . Neuromuscular disorder (HCC)    sciatica  . Seasonal allergies   . Seizures ([place])    last seizure was 2 years ago; unknown etiology. On Keppra.    Past Surgical History:  Procedure Laterality Date  . ABLATION     with polyp removal.  . CESAREAN SECTION     x 3  . COLONOSCOPY WITH PROPOFOL  N/A 01/11/2016   Procedure: COLONOSCOPY WITH PROPOFOL;  Surgeon: Danie Binder, MD;  [facility];  Service: Endoscopy;  Laterality: N/A;  800  . EXAMINATION UNDER ANESTHESIA  02/28/2012   Procedure: EXAM UNDER ANESTHESIA;  Surgeon: Donato Heinz, MD;  Location: [facility];  Service: General;  Laterality: N/A;  . GANGLION CYST EXCISION Left   . HYSTEROSCOPY W/D&C N/A 05/23/2013   Procedure: DILATATION AND CURETTAGE /HYSTEROSCOPY;  Surgeon: Jonnie Kind, MD;  Location: [facility];  Service: Gynecology;  Laterality: N/A;  . POLYPECTOMY N/A 05/23/2013   Procedure: ENDOMETRIAL POLYP REMOVAL;  Surgeon: Jonnie Kind, MD;  Location: [facility];  Service: Gynecology;  Laterality: N/A;  . SPHINCTEROTOMY  02/28/2012   Procedure: SPHINCTEROTOMY;  Surgeon: Donato Heinz, MD;  Location: [facility];  Service: General;  Laterality: N/A;  Lateral Internal Sphincterotomy  . TRIGGER FINGER RELEASE Left    ring finger  . TUBAL LIGATION      Family History  Problem Relation Age of Onset  . Hypertension Mother   . Arthritis Mother   . COPD Mother   . Diabetes Mother   . Heart disease Mother   . Hypertension Father   . Parkinson's disease Father   . Colon cancer Neg Hx     Social History Social History   Tobacco Use  . Smoking status: Current Some Day Smoker    Packs/day: 0.25  Years: 20.00    Pack years: 5.00    Types: Cigarettes  . Smokeless tobacco: Never Used  . Tobacco comment: smokes 1 cig daily  Substance Use Topics  . Alcohol use: Yes    Comment: occasional  . Drug use: No    Allergies  Allergen Reactions  . Lisinopril Swelling    swelling to entire mouth.  . Penicillins Rash    Has patient had a PCN reaction causing immediate rash, facial/tongue/throat swelling, SOB or lightheadedness with hypotension: no - next day Has patient had a PCN reaction causing severe rash involving mucus membranes or skin necrosis: No Has patient had a PCN reaction that required hospitalization  No Has patient had a PCN reaction occurring within the last 10 years: Yes If all of the above answers are "NO", then may proceed with Cephalosporin use.   . Tramadol Other (See Comments)    dizziness    Current Outpatient Medications  Medication Sig Dispense Refill  . albuterol (PROVENTIL HFA;VENTOLIN HFA) 108 (90 BASE) MCG/ACT inhaler Inhale 1-2 puffs into the lungs every 6 (six) hours as needed for wheezing or shortness of breath.     . butalbital-acetaminophen-caffeine (FIORICET, ESGIC) 50-325-40 MG tablet Take 1-2 tablets by mouth every 6 (six) hours as needed for headache. 20 tablet 0  . cyclobenzaprine (FLEXERIL) 10 MG tablet Take 1 tablet (10 mg total) by mouth at bedtime. 30 tablet 0  . hydrochlorothiazide (HYDRODIURIL) 25 MG tablet Take 1 tablet (25 mg total) by mouth daily. 90 tablet 3  . HYDROcodone-acetaminophen (NORCO/VICODIN) 5-325 MG tablet One tablet every six hours for pain. 15 tablet 0  . naproxen (NAPROSYN) 500 MG tablet Take 1 tablet (500 mg total) by mouth 2 (two) times daily with a meal. 60 tablet 5  . predniSONE (DELTASONE) 10 MG tablet Take 2 tablets (20 mg total) by mouth 2 (two) times daily. 20 tablet 0  . tiZANidine (ZANAFLEX) 4 MG tablet   0   No current facility-administered medications for this visit.      Physical Exam  Blood pressure (!) 156/108, pulse (!) 56, height 5\' 1"  (1.549 m), weight 128 lb (58.1 kg).  Constitutional: overall normal hygiene, normal nutrition, well developed, normal grooming, normal body habitus. Assistive device:none  Musculoskeletal: gait and station Limp left, muscle tone and strength are normal, no tremors or atrophy is present.  .  Neurological: coordination overall normal.  Deep tendon reflex/nerve stretch intact.  Sensation normal.  Cranial nerves II-XII intact.   Skin:   Normal overall no scars, lesions, ulcers or rashes. No psoriasis.  Psychiatric: Alert and oriented x 3.  Recent memory intact, remote memory unclear.   Normal mood and affect. Well groomed.  Good eye contact.  Cardiovascular: overall no swelling, no varicosities, no edema bilaterally, normal temperatures of the legs and arms, no clubbing, cyanosis and good capillary refill.  Lymphatic: palpation is normal.  Left knee has some slight posterior swelling, ROM is 0 to 110, she has a limp to the left, NV intact.  All other systems reviewed and are negative   The patient has been educated about the nature of the problem(s) and counseled on treatment options.  The patient appeared to understand what I have discussed and is in agreement with it.  Encounter Diagnoses  Name Primary?  . Chronic pain of left knee Yes  . Cigarette nicotine dependence without complication     PLAN Call if any problems.  Precautions discussed.  Continue current medications.   Return  to clinic 2 months   Electronically Signed [name], MD 9/11/20193:26 PM

## 2018-02-26 ENCOUNTER — Telehealth: Payer: Self-pay | Admitting: Orthopaedic Surgery

## 2018-02-26 NOTE — Telephone Encounter (Signed)
Patient called to relay she is not much better since 02/20/18 visit/treatment. Please advise.  If any medication, [facility] [place]

## 2018-02-27 NOTE — Telephone Encounter (Signed)
Called back to patient per Dr Brooke Bonito response. Scheduled appointment.

## 2018-02-27 NOTE — Telephone Encounter (Signed)
I can offer injection.  No more narcotics.

## 2018-03-06 ENCOUNTER — Ambulatory Visit: Payer: BLUE CROSS/BLUE SHIELD | Admitting: Orthopaedic Surgery

## 2018-03-07 ENCOUNTER — Ambulatory Visit (INDEPENDENT_AMBULATORY_CARE_PROVIDER_SITE_OTHER): Payer: BLUE CROSS/BLUE SHIELD | Admitting: Orthopaedic Surgery

## 2018-03-07 ENCOUNTER — Encounter: Payer: Self-pay | Admitting: Orthopaedic Surgery

## 2018-03-07 ENCOUNTER — Ambulatory Visit: Payer: BLUE CROSS/BLUE SHIELD | Admitting: Orthopaedic Surgery

## 2018-03-07 VITALS — BP 127/89 | HR 63 | Ht 61.0 in | Wt 127.0 lb

## 2018-03-07 DIAGNOSIS — F17210 Nicotine dependence, cigarettes, uncomplicated: Secondary | ICD-10-CM

## 2018-03-07 DIAGNOSIS — G8929 Other chronic pain: Secondary | ICD-10-CM

## 2018-03-07 DIAGNOSIS — M25562 Pain in left knee: Secondary | ICD-10-CM

## 2018-03-07 MED ORDER — HYDROCODONE-ACETAMINOPHEN 5-325 MG PO TABS: ORAL_TABLET | ORAL | 0 refills | Status: DC

## 2018-03-07 NOTE — Progress Notes (Signed)
Patient [id number] Judy Cross, female DOB:10-03-1965, 52 y.o. PYP:[id number]  Chief Complaint  Patient presents with  . Knee Pain    left     HPI  Judy Cross is a 52 y.o. female who has chronic pain of the left knee.  She has swelling and popping but no giving way.  Her grandmother was buried yesterday.  Two hours after the service her mother died.  The patient is very emotional today.  Her knee is bothering her more.  She has no new trauma.   Body mass index is 24 kg/m.  ROS  Review of Systems  HENT: Negative for congestion.   Respiratory: Positive for shortness of breath. Negative for cough.   Cardiovascular: Negative for chest pain and leg swelling.  Endocrine: Negative for cold intolerance.  Musculoskeletal: Positive for arthralgias and gait problem.  Allergic/Immunologic: Negative for environmental allergies.  All other systems reviewed and are negative.   All other systems reviewed and are negative.  The following is a summary of the past history medically, past history surgically, known current medicines, social history and family history.  This information is gathered electronically by the computer from prior information and documentation.  I review this each visit and have found including this information at this point in the chart is beneficial and informative.    Past Medical History:  Diagnosis Date  . Allergy   . Arthritis   . Asthma in adult, mild intermittent, uncomplicated 2/45/8099  . Breast nodule 10/12/2015  . 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 Endometrial polyp 04/28/2013  . Fibroids 04/28/2013  . Hot flashes 03/26/2013  . Hypertension   . Irregular bleeding 03/26/2013  . Neuromuscular disorder (HCC)    sciatica  . Seasonal allergies   . Seizures ([place])    last seizure was 2 years ago; unknown etiology. On Keppra.    Past Surgical History:  Procedure Laterality Date  . ABLATION     with polyp removal.  . CESAREAN SECTION     x 3   . COLONOSCOPY WITH PROPOFOL N/A 01/11/2016   Procedure: COLONOSCOPY WITH PROPOFOL;  Surgeon: Danie Binder, MD;  [facility];  Service: Endoscopy;  Laterality: N/A;  800  . EXAMINATION UNDER ANESTHESIA  02/28/2012   Procedure: EXAM UNDER ANESTHESIA;  Surgeon: Donato Heinz, MD;  Location[facility];  Service: General;  Laterality: N/A;  . GANGLION CYST EXCISION Left   . HYSTEROSCOPY W/D&C N/A 05/23/2013   Procedure: DILATATION AND CURETTAGE /HYSTEROSCOPY;  Surgeon: Jonnie Kind, MD;  Location[facility];  Service: Gynecology;  Laterality: N/A;  . POLYPECTOMY N/A 05/23/2013   Procedure: ENDOMETRIAL POLYP REMOVAL;  Surgeon: Jonnie Kind, MD;  Location[facility];  Service: Gynecology;  Laterality: N/A;  . SPHINCTEROTOMY  02/28/2012   Procedure: SPHINCTEROTOMY;  Surgeon: Donato Heinz, MD;  Location: [facility];  Service: General;  Laterality: N/A;  Lateral Internal Sphincterotomy  . TRIGGER FINGER RELEASE Left    ring finger  . TUBAL LIGATION      Family History  Problem Relation Age of Onset  . Hypertension Mother   . Arthritis Mother   . COPD Mother   . Diabetes Mother   . Heart disease Mother   . Hypertension Father   . Parkinson's disease Father   . Colon cancer Neg Hx     Social History Social History   Tobacco Use  . Smoking status: Current Some Day Smoker    Packs/day: 0.25  Years: 20.00    Pack years: 5.00    Types: Cigarettes  . Smokeless tobacco: Never Used  . Tobacco comment: smokes 1 cig daily  Substance Use Topics  . Alcohol use: Yes    Comment: occasional  . Drug use: No    Allergies  Allergen Reactions  . Lisinopril Swelling    swelling to entire mouth.  . Penicillins Rash    Has patient had a PCN reaction causing immediate rash, facial/tongue/throat swelling, SOB or lightheadedness with hypotension: no - next day Has patient had a PCN reaction causing severe rash involving mucus membranes or skin necrosis: No Has patient had a PCN reaction  that required hospitalization No Has patient had a PCN reaction occurring within the last 10 years: Yes If all of the above answers are "NO", then may proceed with Cephalosporin use.   . Tramadol Other (See Comments)    dizziness    Current Outpatient Medications  Medication Sig Dispense Refill  . albuterol (PROVENTIL HFA;VENTOLIN HFA) 108 (90 BASE) MCG/ACT inhaler Inhale 1-2 puffs into the lungs every 6 (six) hours as needed for wheezing or shortness of breath.     . butalbital-acetaminophen-caffeine (FIORICET, ESGIC) 50-325-40 MG tablet Take 1-2 tablets by mouth every 6 (six) hours as needed for headache. 20 tablet 0  . cyclobenzaprine (FLEXERIL) 10 MG tablet Take 1 tablet (10 mg total) by mouth at bedtime. 30 tablet 0  . hydrochlorothiazide (HYDRODIURIL) 25 MG tablet Take 1 tablet (25 mg total) by mouth daily. 90 tablet 3  . HYDROcodone-acetaminophen (NORCO/VICODIN) 5-325 MG tablet One tablet every six hours for pain. 45 tablet 0  . naproxen (NAPROSYN) 500 MG tablet Take 1 tablet (500 mg total) by mouth 2 (two) times daily with a meal. 60 tablet 5  . tiZANidine (ZANAFLEX) 4 MG tablet   0  . predniSONE (DELTASONE) 10 MG tablet Take 2 tablets (20 mg total) by mouth 2 (two) times daily. (Patient not taking: Reported on 03/07/2018) 20 tablet 0   No current facility-administered medications for this visit.      Physical Exam  Blood pressure 127/89, pulse 63, height 5\' 1"  (1.549 m), weight 127 lb (57.6 kg).  Constitutional: overall normal hygiene, normal nutrition, well developed, normal grooming, normal body habitus. Assistive device:none  Musculoskeletal: gait and station Limp left, muscle tone and strength are normal, no tremors or atrophy is present.  .  Neurological: coordination overall normal.  Deep tendon reflex/nerve stretch intact.  Sensation normal.  Cranial nerves II-XII intact.   Skin:   Normal overall no scars, lesions, ulcers or rashes. No psoriasis.  Psychiatric: Alert  and oriented x 3.  Recent memory intact, remote memory unclear.  Normal mood and affect. Well groomed.  Good eye contact.  Cardiovascular: overall no swelling, no varicosities, no edema bilaterally, normal temperatures of the legs and arms, no clubbing, cyanosis and good capillary refill.  Lymphatic: palpation is normal.  The left lower extremity is examined:  Inspection:  Thigh:  Non-tender and no defects  Knee has swelling 1+ effusion.                        Joint tenderness is present                        Patient is tender over the medial joint line  Lower Leg:  Has normal appearance and no tenderness or defects  Ankle:  Non-tender and no defects  Foot:  Non-tender and no defects Range of Motion:  Knee:  Range of motion is: 0-110                        Crepitus is  present  Ankle:  Range of motion is normal. Strength and Tone:  The left lower extremity has normal strength and tone. Stability:  Knee:  The knee is stable.  Ankle:  The ankle is stable.   All other systems reviewed and are negative   The patient has been educated about the nature of the problem(s) and counseled on treatment options.  The patient appeared to understand what I have discussed and is in agreement with it.  Encounter Diagnoses  Name Primary?  . Chronic pain of left knee Yes  . Cigarette nicotine dependence without complication     PLAN Call if any problems.  Precautions discussed.  Continue current medications.   Return to clinic 6 weeks   I have reviewed the [place] web site prior to prescribing narcotic medicine for this patient.    Electronically Signed [name], MD 9/26/20199:02 AM

## 2018-03-19 ENCOUNTER — Telehealth: Payer: Self-pay | Admitting: Orthopaedic Surgery

## 2018-03-19 MED ORDER — HYDROCODONE-ACETAMINOPHEN 5-325 MG PO TABS: ORAL_TABLET | ORAL | 0 refills | Status: DC

## 2018-03-19 NOTE — Telephone Encounter (Signed)
Hydrocodone-Acetaminophen 5/325 mg  Qty 45 Tablets  PATIENT USES [facility]

## 2018-04-11 ENCOUNTER — Encounter (HOSPITAL_COMMUNITY): Payer: Self-pay

## 2018-04-11 ENCOUNTER — Emergency Department (HOSPITAL_COMMUNITY)
Admission: EM | Admit: 2018-04-11 | Discharge: 2018-04-11 | Disposition: A | Discharge status: Home / Self Care | Payer: BLUE CROSS/BLUE SHIELD | Attending: Emergency Medicine | Admitting: Emergency Medicine

## 2018-04-11 ENCOUNTER — Emergency Department (HOSPITAL_COMMUNITY): Payer: BLUE CROSS/BLUE SHIELD

## 2018-04-11 ENCOUNTER — Other Ambulatory Visit: Payer: Self-pay

## 2018-04-11 DIAGNOSIS — Z79899 Other long term (current) drug therapy: Secondary | ICD-10-CM | POA: Insufficient documentation

## 2018-04-11 DIAGNOSIS — R05 Cough: Secondary | ICD-10-CM | POA: Insufficient documentation

## 2018-04-11 DIAGNOSIS — R0789 Other chest pain: Secondary | ICD-10-CM | POA: Diagnosis not present

## 2018-04-11 DIAGNOSIS — I10 Essential (primary) hypertension: Secondary | ICD-10-CM | POA: Diagnosis not present

## 2018-04-11 DIAGNOSIS — F17210 Nicotine dependence, cigarettes, uncomplicated: Secondary | ICD-10-CM | POA: Insufficient documentation

## 2018-04-11 DIAGNOSIS — J4520 Mild intermittent asthma, uncomplicated: Secondary | ICD-10-CM | POA: Insufficient documentation

## 2018-04-11 DIAGNOSIS — F419 Anxiety disorder, unspecified: Secondary | ICD-10-CM

## 2018-04-11 LAB — CBC
HCT: 43.1 % (ref 36.0–46.0)
Hemoglobin: 14.2 g/dL (ref 12.0–15.0)
MCH: 30.8 pg (ref 26.0–34.0)
MCHC: 32.9 g/dL (ref 30.0–36.0)
MCV: 93.5 fL (ref 80.0–100.0)
Platelets: 287 10*3/uL (ref 150–400)
RBC: 4.61 MIL/uL (ref 3.87–5.11)
RDW: 12.6 % (ref 11.5–15.5)
WBC: 4.7 10*3/uL (ref 4.0–10.5)
nRBC: 0 % (ref 0.0–0.2)

## 2018-04-11 LAB — BASIC METABOLIC PANEL
Anion gap: 9 (ref 5–15)
BUN: 10 mg/dL (ref 6–20)
CO2: 23 mmol/L (ref 22–32)
Calcium: 9.4 mg/dL (ref 8.9–10.3)
Chloride: 107 mmol/L (ref 98–111)
Creatinine, Ser: 0.76 mg/dL (ref 0.44–1.00)
GFR calc Af Amer: >60 mL/min (ref >60)
GFR calc non Af Amer: >60 mL/min (ref >60)
Glucose, Bld: 111 mg/dL — ABNORMAL HIGH (ref 70–99)
Potassium: 3.4 mmol/L — ABNORMAL LOW (ref 3.5–5.1)
Sodium: 139 mmol/L (ref 135–145)

## 2018-04-11 LAB — D-DIMER, QUANTITATIVE (NOT AT ARMC): D-Dimer, Quant: 0.31 ug/mL-FEU (ref 0.00–0.50)

## 2018-04-11 LAB — TROPONIN I
Troponin I: <0.03 ng/mL (ref <0.03)
Troponin I: <0.03 ng/mL (ref <0.03)

## 2018-04-11 MED ORDER — HYDROXYZINE HCL 25 MG PO TABS
25.0000 mg | ORAL_TABLET | Freq: Once | ORAL | Status: AC
  Administered 2018-04-11: 25 mg via ORAL
  Filled 2018-04-11: qty 1

## 2018-04-11 MED ORDER — KETOROLAC TROMETHAMINE 60 MG/2ML IM SOLN
60.0000 mg | Freq: Once | INTRAMUSCULAR | Status: AC
  Administered 2018-04-11: 60 mg via INTRAMUSCULAR
  Filled 2018-04-11: qty 2

## 2018-04-11 MED ORDER — IBUPROFEN 600 MG PO TABS: 600.0000 mg | ORAL_TABLET | Freq: Three times a day (TID) | ORAL | 0 refills | Status: DC | PRN

## 2018-04-11 MED ORDER — HYDROXYZINE HCL 25 MG PO TABS: 25.0000 mg | ORAL_TABLET | Freq: Four times a day (QID) | ORAL | 0 refills | Status: DC | PRN

## 2018-04-11 NOTE — ED Provider Notes (Signed)
[facility] EMERGENCY DEPARTMENT Provider Note   CSN: [id number] Arrival date & time: 04/11/18  1448     History   Chief Complaint Chief Complaint  Patient presents with  . Chest Pain    Judy [place] is a 52 y.o. female.  Judy Patient work with sharp left-sided chest pain this morning around 5 AM.  States the pain is worse with movement and deep breathing.  She had mild nonproductive cough.  Denies any fever or chills.  No shortness of breath.  No radiation of the pain.  Denies any new lower extremity swelling.  No recent immobilization or extended travel.  Patient does admit to being under increased stress as of late. Past Medical History:  Diagnosis Date  . Allergy   . Arthritis   . Asthma in adult, mild intermittent, uncomplicated 1/85/6314  . Breast nodule 10/12/2015  . 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 Endometrial polyp 04/28/2013  . Fibroids 04/28/2013  . Hot flashes 03/26/2013  . Hypertension   . Irregular bleeding 03/26/2013  . Neuromuscular disorder (HCC)    sciatica  . Seasonal allergies   . Seizures ([place])    last seizure was 2 years ago; unknown etiology. On Keppra.    Patient Active Problem List   Diagnosis Date Noted  . Lumbar degenerative disc disease 01/12/2017  . Essential hypertension 12/07/2016  . Asthma in adult, mild intermittent, uncomplicated 97/07/6376  . Special screening for malignant neoplasms, colon 01/07/2016  . Breast nodule 10/12/2015  . Low back pain 12/08/2013  . Fibroids 04/28/2013  . Hot flashes 03/26/2013  . Enlarged uterus 03/26/2013    Past Surgical History:  Procedure Laterality Date  . ABLATION     with polyp removal.  . CESAREAN SECTION     x 3  . COLONOSCOPY WITH PROPOFOL N/A 01/11/2016   Procedure: COLONOSCOPY WITH PROPOFOL;  Surgeon: Danie Binder, MD;  [facility];  Service: Endoscopy;  Laterality: N/A;  800  . EXAMINATION UNDER ANESTHESIA  02/28/2012   Procedure: EXAM UNDER ANESTHESIA;   Surgeon: Donato Heinz, MD;  Location: [facility] ORS;  Service: General;  Laterality: N/A;  . GANGLION CYST EXCISION Left   . HYSTEROSCOPY W/D&C N/A 05/23/2013   Procedure: DILATATION AND CURETTAGE /HYSTEROSCOPY;  Surgeon: Jonnie Kind, MD;  Location: [facility] OR[facility];  Service: Gynecology;  Laterality: N/A;  . POLYPECTOMY N/A 05/23/2013   Procedure: ENDOMETRIAL POLYP REMOVAL;  Surgeon: Jonnie Kind, MD;  Location: [facility];  Service: Gynecology;  Laterality: N/A;  . SPHINCTEROTOMY  02/28/2012   Procedure: SPHINCTEROTOMY;  Surgeon: Donato Heinz, MD;  Location: [facility] ORS;  Service: General;  Laterality: N/A;  Lateral Internal Sphincterotomy  . TRIGGER FINGER RELEASE Left    ring finger  . TUBAL LIGATION       OB History    Gravida  4   Para  3   Term      Preterm      AB  1   Living  3     SAB  1   TAB      Ectopic      Multiple      Live Births  3            Home Medications    Prior to Admission medications   Medication Sig Start Date End Date Taking? Authorizing Provider  albuterol (PROVENTIL HFA;VENTOLIN HFA) 108 (90 BASE) MCG/ACT inhaler Inhale 1-2 puffs into the lungs every 6 (  six) hours as needed for wheezing or shortness of breath.    Yes [provider]  cyclobenzaprine (FLEXERIL) 10 MG tablet Take 1 tablet (10 mg total) by mouth at bedtime. 06/21/17  Yes Raylene Everts, MD  hydrochlorothiazide (HYDRODIURIL) 25 MG tablet Take 1 tablet (25 mg total) by mouth daily. 12/07/16  Yes Raylene Everts, MD  HYDROcodone-acetaminophen (NORCO/VICODIN) 5-325 MG tablet One tablet every six hours for pain. Must last 30 days. 03/19/18  Yes [name], MD  naproxen (NAPROSYN) 500 MG tablet Take 1 tablet (500 mg total) by mouth 2 (two) times daily with a meal. 11/15/17  Yes [name], MD  hydrOXYzine (ATARAX/VISTARIL) 25 MG tablet Take 1 tablet (25 mg total) by mouth every 6 (six) hours as needed for anxiety. 04/11/18   Julianne Rice, MD  ibuprofen (ADVIL,MOTRIN)  600 MG tablet Take 1 tablet (600 mg total) by mouth 3 (three) times daily with meals as needed for moderate pain. 04/11/18   Julianne Rice, MD    Family History Family History  Problem Relation Age of Onset  . Hypertension Mother   . Arthritis Mother   . COPD Mother   . Diabetes Mother   . Heart disease Mother   . Hypertension Father   . Parkinson's disease Father   . Colon cancer Neg Hx     Social History Social History   Tobacco Use  . Smoking status: Current Some Day Smoker    Packs/day: 0.25    Years: 20.00    Pack years: 5.00    Types: Cigarettes  . Smokeless tobacco: Never Used  . Tobacco comment: smokes 1 cig daily  Substance Use Topics  . Alcohol use: Yes    Comment: occasional  . Drug use: No     Allergies   Lisinopril; Penicillins; and Tramadol   Review of Systems Review of Systems  Constitutional: Negative for chills and fever.  HENT: Negative for sore throat and trouble swallowing.   Respiratory: Positive for cough. Negative for shortness of breath.   Cardiovascular: Positive for chest pain. Negative for palpitations and leg swelling.  Gastrointestinal: Negative for abdominal pain, constipation, diarrhea, nausea and vomiting.  Genitourinary: Negative for dysuria, flank pain and frequency.  Musculoskeletal: Negative for back pain, neck pain and neck stiffness.  Skin: Negative for rash and wound.  Neurological: Negative for dizziness, weakness, light-headedness, numbness and headaches.  All other systems reviewed and are negative.    Physical Exam Updated Vital Signs BP (!) 122/92 (BP Location: Left Arm)   Pulse 63   Temp 98.3 F (36.8 C) (Oral)   Resp 18   Ht 5\' 1"  (1.549 m)   Wt 62.6 kg   SpO2 100%   BMI 26.07 kg/m   Physical Exam  Constitutional: She is oriented to person, place, and time. She appears well-developed and well-nourished. No distress.  HENT:  Head: Normocephalic and atraumatic.  Mouth/Throat: Oropharynx is clear and  moist. No oropharyngeal exudate.  Eyes: Pupils are equal, round, and reactive to light. EOM are normal.  Neck: Normal range of motion. Neck supple. No JVD present.  Cardiovascular: Normal rate, regular rhythm and normal heart sounds. Exam reveals no gallop and no friction rub.  No murmur heard. Pulmonary/Chest: Effort normal and breath sounds normal. No stridor. No respiratory distress. She has no wheezes. She has no rales. She exhibits tenderness.  Chest pain is completely reproduced with palpation of the left chest at the parasternal border.  No crepitance or deformity.  Abdominal:  Soft. Bowel sounds are normal. There is no tenderness. There is no rebound and no guarding.  Musculoskeletal: Normal range of motion. She exhibits no edema or tenderness.  No calf tenderness.  No midline thoracic or lumbar tenderness.  Distal pulses are 2+.  Neurological: She is alert and oriented to person, place, and time.  Moves all extremities without focal deficit.  Sensation intact.  Skin: Skin is warm and dry. Capillary refill takes less than 2 seconds. No rash noted. She is not diaphoretic. No erythema.  Psychiatric: Her behavior is normal.  Patient is very anxious appearing  Nursing note and vitals reviewed.    ED Treatments / Results  Labs (all labs ordered are listed, but only abnormal results are displayed) Labs Reviewed  BASIC METABOLIC PANEL - Abnormal; Notable for the following components:      Result Value   Potassium 3.4 (*)    Glucose, Bld 111 (*)    All other components within normal limits  CBC  TROPONIN I  D-DIMER, QUANTITATIVE (NOT AT [facility])  TROPONIN I    EKG EKG Interpretation  Date/Time:  Thursday April 11 2018 08:11:06 EDT Ventricular Rate:  56 PR Interval:    QRS Duration: 88 QT Interval:  430 QTC Calculation: 415 R Axis:   63 Text Interpretation:  Sinus rhythm Probable anteroseptal infarct, old Confirmed by Julianne Rice [phone]) on 04/11/2018 8:21:23  AM   Radiology Dg Chest 2 View  Result Date: 04/11/2018 CLINICAL DATA:  Chest pain today with shortness of breath. EXAM: CHEST - 2 VIEW COMPARISON:  07/10/2014 FINDINGS: The heart size and mediastinal contours are within normal limits. Both lungs are clear. No pleural effusion or pneumothorax. The visualized skeletal structures are unremarkable. IMPRESSION: No active cardiopulmonary disease. Electronically Signed   By: Lajean Manes M.D.   On: 04/11/2018 09:18    Procedures Procedures (including critical care time)  Medications Ordered in ED Medications  ketorolac (TORADOL) injection 60 mg (60 mg Intramuscular Given 04/11/18 0855)  hydrOXYzine (ATARAX/VISTARIL) tablet 25 mg (25 mg Oral Given 04/11/18 0855)     Initial Impression / Assessment and Plan / ED Course  I have reviewed the triage vital signs and the nursing notes.  Pertinent labs & imaging results that were available during my care of the patient were reviewed by me and considered in my medical decision making (see chart for details).    Patient with atypical chest pain likely exacerbated by stress and anxiety.  Troponin x2 is normal.  EKG without ischemic findings.  Normal dimer.  Patient states he is feeling much better after medications.  Will discharge home with strict return precautions.   Final Clinical Impressions(s) / ED Diagnoses   Final diagnoses:  Chest wall pain  Anxiety    ED Discharge Orders         Ordered    ibuprofen (ADVIL,MOTRIN) 600 MG tablet  3 times daily with meals PRN     04/11/18 1417    hydrOXYzine (ATARAX/VISTARIL) 25 MG tablet  Every 6 hours PRN     04/11/18 1417           Julianne Rice, MD 04/11/18 1419

## 2018-04-11 NOTE — ED Triage Notes (Signed)
Pt woke up with pain under left breast . Approx 0515 this am. Pain is constant and increase with breathing and movement. Denies N/V or sob

## 2018-04-18 ENCOUNTER — Ambulatory Visit (INDEPENDENT_AMBULATORY_CARE_PROVIDER_SITE_OTHER): Payer: Medicaid Other | Admitting: Orthopaedic Surgery

## 2018-04-18 ENCOUNTER — Encounter: Payer: Self-pay | Admitting: Orthopaedic Surgery

## 2018-04-18 VITALS — BP 150/90 | HR 69 | Ht 61.0 in | Wt 128.0 lb

## 2018-04-18 DIAGNOSIS — G8929 Other chronic pain: Secondary | ICD-10-CM

## 2018-04-18 DIAGNOSIS — M25562 Pain in left knee: Secondary | ICD-10-CM

## 2018-04-18 DIAGNOSIS — F17210 Nicotine dependence, cigarettes, uncomplicated: Secondary | ICD-10-CM

## 2018-04-18 MED ORDER — HYDROCODONE-ACETAMINOPHEN 5-325 MG PO TABS: ORAL_TABLET | ORAL | 0 refills | Status: DC

## 2018-04-18 NOTE — Progress Notes (Signed)
Patient Judy Cross, female DOB:04/23/1966, 52 y.o. VOZ:[id number]  Chief Complaint  Patient presents with  . Knee Pain    Left knee    HPI  Judy Cross is a 52 y.o. female who has pain of the left knee.  She has swelling and popping.  She has had problems with her landlord and is in the process of moving.  She has no new trauma.  She has no giving way.  She is taking her medicine.   Body mass index is 24.19 kg/m.  ROS  Review of Systems  HENT: Negative for congestion.   Respiratory: Positive for shortness of breath. Negative for cough.   Cardiovascular: Negative for chest pain and leg swelling.  Endocrine: Negative for cold intolerance.  Musculoskeletal: Positive for arthralgias and gait problem.  Allergic/Immunologic: Negative for environmental allergies.  All other systems reviewed and are negative.   All other systems reviewed and are negative.  The following is a summary of the past history medically, past history surgically, known current medicines, social history and family history.  This information is gathered electronically by the computer from prior information and documentation.  I review this each visit and have found including this information at this point in the chart is beneficial and informative.    Past Medical History:  Diagnosis Date  . Allergy   . Arthritis   . Asthma in adult, mild intermittent, uncomplicated 10/04/9561  . Breast nodule 10/12/2015  . 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 Endometrial polyp 04/28/2013  . Fibroids 04/28/2013  . Hot flashes 03/26/2013  . Hypertension   . Irregular bleeding 03/26/2013  . Neuromuscular disorder (HCC)    sciatica  . Seasonal allergies   . Seizures ([place])    last seizure was 2 years ago; unknown etiology. On Keppra.    Past Surgical History:  Procedure Laterality Date  . ABLATION     with polyp removal.  . CESAREAN SECTION     x 3  . COLONOSCOPY WITH PROPOFOL N/A 01/11/2016   Procedure: COLONOSCOPY WITH PROPOFOL;  Surgeon: Danie Binder, MD;  Location: [facility];  Service: Endoscopy;  Laterality: N/A;  800  . EXAMINATION UNDER ANESTHESIA  02/28/2012   Procedure: EXAM UNDER ANESTHESIA;  Surgeon: Donato Heinz, MD;  Location: [facility] ORS;  Service: General;  Laterality: N/A;  . GANGLION CYST EXCISION Left   . HYSTEROSCOPY W/D&C N/A 05/23/2013   Procedure: DILATATION AND CURETTAGE /HYSTEROSCOPY;  Surgeon: Jonnie Kind, MD;  Location: [facility] OR[facility];  Service: Gynecology;  Laterality: N/A;  . POLYPECTOMY N/A 05/23/2013   Procedure: ENDOMETRIAL POLYP REMOVAL;  Surgeon: Jonnie Kind, MD;  Location: [facility];  Service: Gynecology;  Laterality: N/A;  . SPHINCTEROTOMY  02/28/2012   Procedure: SPHINCTEROTOMY;  Surgeon: Donato Heinz, MD;  Location: [facility] OR[facility];  Service: General;  Laterality: N/A;  Lateral Internal Sphincterotomy  . TRIGGER FINGER RELEASE Left    ring finger  . TUBAL LIGATION      Family History  Problem Relation Age of Onset  . Hypertension Mother   . Arthritis Mother   . COPD Mother   . Diabetes Mother   . Heart disease Mother   . Hypertension Father   . Parkinson's disease Father   . Colon cancer Neg Hx     Social History Social History   Tobacco Use  . Smoking status: Current Some Day Smoker    Packs/day: 0.25    Years: 20.00  Pack years: 5.00    Types: Cigarettes  . Smokeless tobacco: Never Used  . Tobacco comment: smokes 1 cig daily  Substance Use Topics  . Alcohol use: Yes    Comment: occasional  . Drug use: No    Allergies  Allergen Reactions  . Lisinopril Swelling    swelling to entire mouth.  . Penicillins Rash    Has patient had a PCN reaction causing immediate rash, facial/tongue/throat swelling, SOB or lightheadedness with hypotension: no - next day Has patient had a PCN reaction causing severe rash involving mucus membranes or skin necrosis: No Has patient had a PCN reaction that required hospitalization No Has patient  had a PCN reaction occurring within the last 10 years: Yes If all of the above answers are "NO", then may proceed with Cephalosporin use.   . Tramadol Other (See Comments)    dizziness    Current Outpatient Medications  Medication Sig Dispense Refill  . albuterol (PROVENTIL HFA;VENTOLIN HFA) 108 (90 BASE) MCG/ACT inhaler Inhale 1-2 puffs into the lungs every 6 (six) hours as needed for wheezing or shortness of breath.     . cyclobenzaprine (FLEXERIL) 10 MG tablet Take 1 tablet (10 mg total) by mouth at bedtime. 30 tablet 0  . hydrochlorothiazide (HYDRODIURIL) 25 MG tablet Take 1 tablet (25 mg total) by mouth daily. 90 tablet 3  . HYDROcodone-acetaminophen (NORCO/VICODIN) 5-325 MG tablet One tablet every six hours for pain. Must last 30 days. 45 tablet 0  . hydrOXYzine (ATARAX/VISTARIL) 25 MG tablet Take 1 tablet (25 mg total) by mouth every 6 (six) hours as needed for anxiety. 12 tablet 0  . ibuprofen (ADVIL,MOTRIN) 600 MG tablet Take 1 tablet (600 mg total) by mouth 3 (three) times daily with meals as needed for moderate pain. 30 tablet 0  . naproxen (NAPROSYN) 500 MG tablet Take 1 tablet (500 mg total) by mouth 2 (two) times daily with a meal. 60 tablet 5   No current facility-administered medications for this visit.      Physical Exam  Blood pressure (!) 150/90, pulse 69, height 5\' 1"  (1.549 m), weight 128 lb (58.1 kg).  Constitutional: overall normal hygiene, normal nutrition, well developed, normal grooming, normal body habitus. Assistive device:none  Musculoskeletal: gait and station Limp left, muscle tone and strength are normal, no tremors or atrophy is present.  .  Neurological: coordination overall normal.  Deep tendon reflex/nerve stretch intact.  Sensation normal.  Cranial nerves II-XII intact.   Skin:   Normal overall no scars, lesions, ulcers or rashes. No psoriasis.  Psychiatric: Alert and oriented x 3.  Recent memory intact, remote memory unclear.  Normal mood and  affect. Well groomed.  Good eye contact.  Cardiovascular: overall no swelling, no varicosities, no edema bilaterally, normal temperatures of the legs and arms, no clubbing, cyanosis and good capillary refill.  Lymphatic: palpation is normal.  The left lower extremity is examined:  Inspection:  Thigh:  Non-tender and no defects  Knee has swelling 1+ effusion.                        Joint tenderness is present                        Patient is tender over the medial joint line  Lower Leg:  Has normal appearance and no tenderness or defects  Ankle:  Non-tender and no defects  Foot:  Non-tender and no defects Range  of Motion:  Knee:  Range of motion is: 0-100                        Crepitus is  present  Ankle:  Range of motion is normal. Strength and Tone:  The left lower extremity has normal strength and tone. Stability:  Knee:  The knee is stable.  Ankle:  The ankle is stable.   All other systems reviewed and are negative   The patient has been educated about the nature of the problem(s) and counseled on treatment options.  The patient appeared to understand what I have discussed and is in agreement with it.  Encounter Diagnoses  Name Primary?  . Chronic pain of left knee Yes  . Cigarette nicotine dependence without complication     PLAN Call if any problems.  Precautions discussed.  Continue current medications.   Return to clinic 6 weeks   I have reviewed the [place] web site prior to prescribing narcotic medicine for this patient.    Electronically Signed [name], MD 11/7/20199:20 AM

## 2018-04-23 ENCOUNTER — Ambulatory Visit: Payer: BLUE CROSS/BLUE SHIELD | Admitting: Orthopaedic Surgery

## 2018-05-22 ENCOUNTER — Emergency Department (HOSPITAL_COMMUNITY)
Admission: EM | Admit: 2018-05-22 | Discharge: 2018-05-22 | Disposition: A | Discharge status: Home / Self Care | Payer: Medicaid Other | Attending: Emergency Medicine | Admitting: Emergency Medicine

## 2018-05-22 ENCOUNTER — Emergency Department (HOSPITAL_COMMUNITY): Payer: Medicaid Other

## 2018-05-22 ENCOUNTER — Telehealth: Payer: Self-pay | Admitting: Radiology

## 2018-05-22 ENCOUNTER — Other Ambulatory Visit: Payer: Self-pay

## 2018-05-22 ENCOUNTER — Encounter (HOSPITAL_COMMUNITY): Payer: Self-pay

## 2018-05-22 DIAGNOSIS — M542 Cervicalgia: Secondary | ICD-10-CM | POA: Diagnosis not present

## 2018-05-22 DIAGNOSIS — Z760 Encounter for issue of repeat prescription: Secondary | ICD-10-CM | POA: Insufficient documentation

## 2018-05-22 DIAGNOSIS — S0990XA Unspecified injury of head, initial encounter: Secondary | ICD-10-CM

## 2018-05-22 DIAGNOSIS — W102XXA Fall (on)(from) incline, initial encounter: Secondary | ICD-10-CM | POA: Diagnosis not present

## 2018-05-22 DIAGNOSIS — Y998 Other external cause status: Secondary | ICD-10-CM | POA: Diagnosis not present

## 2018-05-22 DIAGNOSIS — F17210 Nicotine dependence, cigarettes, uncomplicated: Secondary | ICD-10-CM | POA: Diagnosis not present

## 2018-05-22 DIAGNOSIS — W19XXXA Unspecified fall, initial encounter: Secondary | ICD-10-CM

## 2018-05-22 DIAGNOSIS — Y929 Unspecified place or not applicable: Secondary | ICD-10-CM | POA: Insufficient documentation

## 2018-05-22 DIAGNOSIS — S20211A Contusion of right front wall of thorax, initial encounter: Secondary | ICD-10-CM | POA: Insufficient documentation

## 2018-05-22 DIAGNOSIS — Y9389 Activity, other specified: Secondary | ICD-10-CM | POA: Diagnosis not present

## 2018-05-22 DIAGNOSIS — Z79899 Other long term (current) drug therapy: Secondary | ICD-10-CM | POA: Diagnosis not present

## 2018-05-22 DIAGNOSIS — J45909 Unspecified asthma, uncomplicated: Secondary | ICD-10-CM | POA: Diagnosis not present

## 2018-05-22 MED ORDER — NAPROXEN 500 MG PO TABS: 500.0000 mg | ORAL_TABLET | Freq: Two times a day (BID) | ORAL | 0 refills | Status: DC

## 2018-05-22 MED ORDER — HYDROCHLOROTHIAZIDE 25 MG PO TABS: 25.0000 mg | ORAL_TABLET | Freq: Every day | ORAL | 0 refills | Status: DC

## 2018-05-22 MED ORDER — HYDROCODONE-ACETAMINOPHEN 5-325 MG PO TABS
1.0000 | ORAL_TABLET | Freq: Once | ORAL | Status: AC
  Administered 2018-05-22: 1 via ORAL
  Filled 2018-05-22: qty 1

## 2018-05-22 MED ORDER — HYDROCODONE-ACETAMINOPHEN 5-325 MG PO TABS: ORAL_TABLET | ORAL | 0 refills | Status: DC

## 2018-05-22 NOTE — Telephone Encounter (Signed)
HYDROcodone-acetaminophen (NORCO/VICODIN) 5-325 MG tablet  0 ordered        Summary: One tablet every six hours for pain. Must last 30 days., Normal  Start: 04/18/2018 Ord/Sold: 04/18/2018 (O) Report Taking:  Long-term:  Pharmacy: [id number] - [place], [place] Dose History ChangeDiscontinue     Patient Sig: One tablet every six hours for pain. Must last 30 days.     Ordered on: 04/18/2018     Authorized by: [name]     Dispense: 45 tablet     Admin Instructions: One tablet every six hours for pain. Must last 30 days.

## 2018-05-22 NOTE — ED Provider Notes (Signed)
[facility] EMERGENCY DEPARTMENT Provider Note   CSN: [id number] Arrival date & time: 05/22/18  0744     History   Chief Complaint Chief Complaint  Patient presents with  . Fall    HPI Judy Cross is a 52 y.o. female.  HPI   Judy Cross is a 52 y.o. female who presents to the Emergency Department complaining of posterior headache, neck pain, and right rib pain.  Symptoms began after a mechanical fall that occurred 2 days ago.  She states this was a injury that occurred at her place of employment.  She states that she was walking down a ramp at the surface was wet causing her to slip and fall.  She states that she fell on the back of her head striking her head on the ramp.  Since then, she complains of worsening headache and neck pain.  She describes the pain is constant and throbbing.  She also complains of pain to her right rib associated with cough deep breathing and movement.  She denies LOC, vomiting, visual changes, numbness or weakness of her extremities and shortness of breath.  She has not tried any therapies prior to arrival.   Past Medical History:  Diagnosis Date  . Allergy   . Arthritis   . Asthma in adult, mild intermittent, uncomplicated 0/24/0973  . Breast nodule 10/12/2015  . 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 Endometrial polyp 04/28/2013  . Fibroids 04/28/2013  . Hot flashes 03/26/2013  . Hypertension   . Irregular bleeding 03/26/2013  . Neuromuscular disorder (HCC)    sciatica  . Seasonal allergies   . Seizures ([place])    last seizure was 2 years ago; unknown etiology. On Keppra.    Patient Active Problem List   Diagnosis Date Noted  . Lumbar degenerative disc disease 01/12/2017  . Essential hypertension 12/07/2016  . Asthma in adult, mild intermittent, uncomplicated 53/29/9242  . Special screening for malignant neoplasms, colon 01/07/2016  . Breast nodule 10/12/2015  . Low back pain 12/08/2013  . Fibroids 04/28/2013  . Hot flashes  03/26/2013  . Enlarged uterus 03/26/2013    Past Surgical History:  Procedure Laterality Date  . ABLATION     with polyp removal.  . CESAREAN SECTION     x 3  . COLONOSCOPY WITH PROPOFOL N/A 01/11/2016   Procedure: COLONOSCOPY WITH PROPOFOL;  Surgeon: Danie Binder, MD;  Location: [facility];  Service: Endoscopy;  Laterality: N/A;  800  . EXAMINATION UNDER ANESTHESIA  02/28/2012   Procedure: EXAM UNDER ANESTHESIA;  Surgeon: Donato Heinz, MD;  Location: [facility] ORS;  Service: General;  Laterality: N/A;  . GANGLION CYST EXCISION Left   . HYSTEROSCOPY W/D&C N/A 05/23/2013   Procedure: DILATATION AND CURETTAGE /HYSTEROSCOPY;  Surgeon: Jonnie Kind, MD;  Location: [facility] ORS;  Service: Gynecology;  Laterality: N/A;  . POLYPECTOMY N/A 05/23/2013   Procedure: ENDOMETRIAL POLYP REMOVAL;  Surgeon: Jonnie Kind, MD;  Location: [facility] ORS;  Service: Gynecology;  Laterality: N/A;  . SPHINCTEROTOMY  02/28/2012   Procedure: SPHINCTEROTOMY;  Surgeon: Donato Heinz, MD;  Location: [facility] ORS;  Service: General;  Laterality: N/A;  Lateral Internal Sphincterotomy  . TRIGGER FINGER RELEASE Left    ring finger  . TUBAL LIGATION       OB History    Gravida  4   Para  3   Term      Preterm      AB  1  Living  3     SAB  1   TAB      Ectopic      Multiple      Live Births  3            Home Medications    Prior to Admission medications   Medication Sig Start Date End Date Taking? Authorizing Provider  albuterol (PROVENTIL HFA;VENTOLIN HFA) 108 (90 BASE) MCG/ACT inhaler Inhale 1-2 puffs into the lungs every 6 (six) hours as needed for wheezing or shortness of breath.     [provider]  cyclobenzaprine (FLEXERIL) 10 MG tablet Take 1 tablet (10 mg total) by mouth at bedtime. 06/21/17   Raylene Everts, MD  hydrochlorothiazide (HYDRODIURIL) 25 MG tablet Take 1 tablet (25 mg total) by mouth daily. 12/07/16   Raylene Everts, MD  HYDROcodone-acetaminophen (NORCO/VICODIN)  5-325 MG tablet One tablet every six hours for pain. Must last 30 days. 04/18/18   [name], MD  hydrOXYzine (ATARAX/VISTARIL) 25 MG tablet Take 1 tablet (25 mg total) by mouth every 6 (six) hours as needed for anxiety. 04/11/18   Julianne Rice, MD  ibuprofen (ADVIL,MOTRIN) 600 MG tablet Take 1 tablet (600 mg total) by mouth 3 (three) times daily with meals as needed for moderate pain. 04/11/18   Julianne Rice, MD  naproxen (NAPROSYN) 500 MG tablet Take 1 tablet (500 mg total) by mouth 2 (two) times daily with a meal. 11/15/17   [name], MD    Family History Family History  Problem Relation Age of Onset  . Hypertension Mother   . Arthritis Mother   . COPD Mother   . Diabetes Mother   . Heart disease Mother   . Hypertension Father   . Parkinson's disease Father   . Colon cancer Neg Hx     Social History Social History   Tobacco Use  . Smoking status: Current Some Day Smoker    Packs/day: 0.25    Years: 20.00    Pack years: 5.00    Types: Cigarettes  . Smokeless tobacco: Never Used  . Tobacco comment: smokes 1 cig daily  Substance Use Topics  . Alcohol use: Yes    Comment: occasional  . Drug use: No     Allergies   Lisinopril; Penicillins; and Tramadol   Review of Systems Review of Systems  Constitutional: Negative for chills and fever.  Eyes: Negative for visual disturbance.  Respiratory: Negative for shortness of breath.   Cardiovascular: Positive for chest pain (Right rib pain).  Gastrointestinal: Negative for abdominal pain, nausea and vomiting.  Genitourinary: Negative for difficulty urinating, dysuria and flank pain.  Musculoskeletal: Positive for neck pain. Negative for joint swelling.  Skin: Negative for color change and wound.  Neurological: Positive for headaches. Negative for dizziness, syncope and numbness.  Psychiatric/Behavioral: Negative for confusion.     Physical Exam Updated Vital Signs BP (!) 160/105 (BP Location: Left Arm)    Pulse 66   Temp 98.1 F (36.7 C) (Oral)   Resp 18   Ht 5\' 1"  (1.549 m)   Wt 59 kg   SpO2 99%   BMI 24.56 kg/m   Physical Exam  Constitutional: She appears well-nourished. No distress.  HENT:  Mouth/Throat: Oropharynx is clear and moist.  Diffuse tenderness of the posterior scalp.  No abrasions or hematomas on exam.  Eyes: Pupils are equal, round, and reactive to light. Conjunctivae and EOM are normal.  Neck: Phonation normal. Spinous process tenderness and muscular tenderness  present. No edema and normal range of motion present.  Cardiovascular: Normal rate, regular rhythm and intact distal pulses.  Pulmonary/Chest: Effort normal and breath sounds normal. No respiratory distress. She exhibits tenderness (Focal tenderness to palpation right lateral chest wall.  No abrasions, ecchymosis, or crepitus.).  Abdominal: Soft. She exhibits no distension and no mass. There is no tenderness. There is no guarding.  Musculoskeletal: She exhibits tenderness. She exhibits no edema.  Neurological: She is alert. She has normal strength. No sensory deficit. Gait normal. GCS eye subscore is 4. GCS verbal subscore is 5. GCS motor subscore is 6.  CN II-XII grossly intact  Skin: Skin is warm. Capillary refill takes less than 2 seconds.  Psychiatric: She has a normal mood and affect.  Nursing note and vitals reviewed.    ED Treatments / Results  Labs (all labs ordered are listed, but only abnormal results are displayed) Labs Reviewed - No data to display  EKG None  Radiology Dg Ribs Unilateral W/chest Right  Result Date: 05/22/2018 CLINICAL DATA:  Fall on Monday with right rib pain. Initial encounter. EXAM: RIGHT RIBS AND CHEST - 3+ VIEW COMPARISON:  None. FINDINGS: No fracture or other bone lesions are seen involving the ribs. There is no evidence of pneumothorax or pleural effusion. Both lungs are clear. Heart size and mediastinal contours are within normal limits. IMPRESSION: Negative.  Electronically Signed   By: Monte Fantasia M.D.   On: 05/22/2018 09:06   Ct Head Wo Contrast  Result Date: 05/22/2018 CLINICAL DATA:  Pain following fall EXAM: CT HEAD WITHOUT CONTRAST CT CERVICAL SPINE WITHOUT CONTRAST TECHNIQUE: Multidetector CT imaging of the head and cervical spine was performed following the standard protocol without intravenous contrast. Multiplanar CT image reconstructions of the cervical spine were also generated. COMPARISON:  CT cervical spine September 12, 2010; head CT February 13, 2014; brain MRI of October 05, 2016 FINDINGS: CT HEAD FINDINGS Brain: The ventricles are normal in size and configuration. There is no intracranial mass, hemorrhage, extra-axial fluid collection, or midline shift. An asymmetric deep sulcus in the medial right temporal lobe is a stable anatomic variant. No acute infarct is evident. No focal lesions involving brain parenchyma evident. Vascular: No hyperdense vessel. There are foci calcification in the carotid siphon regions. Skull: The bony calvarium appears intact. Sinuses/Orbits: There is mucosal thickening in several ethmoid air cells. Other visualized paranasal sinuses are clear. Visualized orbits appear symmetric bilaterally. Other: Mastoid air cells are clear. CT CERVICAL SPINE FINDINGS Alignment: There is no spondylolisthesis. Skull base and vertebrae: Skull base and craniocervical junction regions appear normal. No evident fracture. No blastic or lytic bone lesions. Soft tissues and spinal canal: Prevertebral soft tissues and predental space regions are normal. There is no cord or canal hematoma evident. No paraspinous lesions appreciable. Disc levels: Disc spaces appear unremarkable. There is no appreciable nerve root edema or effacement. No disc extrusion or stenosis. Upper chest: Visualized upper lung regions are clear. There is slight bullous disease in the medial left apex. Other: None IMPRESSION: CT head: Brain parenchyma appears unremarkable. No  mass or hemorrhage. There are foci of arterial vascular calcification. There is mucosal thickening in several ethmoid air cells. CT cervical spine: No fracture or spondylolisthesis. No nerve root edema or effacement. No disc extrusion or stenosis. Electronically Signed   By: Lowella Grip III M.D.   On: 05/22/2018 09:11   Ct Cervical Spine Wo Contrast  Result Date: 05/22/2018 CLINICAL DATA:  Pain following fall EXAM: CT HEAD  WITHOUT CONTRAST CT CERVICAL SPINE WITHOUT CONTRAST TECHNIQUE: Multidetector CT imaging of the head and cervical spine was performed following the standard protocol without intravenous contrast. Multiplanar CT image reconstructions of the cervical spine were also generated. COMPARISON:  CT cervical spine September 12, 2010; head CT February 13, 2014; brain MRI of October 05, 2016 FINDINGS: CT HEAD FINDINGS Brain: The ventricles are normal in size and configuration. There is no intracranial mass, hemorrhage, extra-axial fluid collection, or midline shift. An asymmetric deep sulcus in the medial right temporal lobe is a stable anatomic variant. No acute infarct is evident. No focal lesions involving brain parenchyma evident. Vascular: No hyperdense vessel. There are foci calcification in the carotid siphon regions. Skull: The bony calvarium appears intact. Sinuses/Orbits: There is mucosal thickening in several ethmoid air cells. Other visualized paranasal sinuses are clear. Visualized orbits appear symmetric bilaterally. Other: Mastoid air cells are clear. CT CERVICAL SPINE FINDINGS Alignment: There is no spondylolisthesis. Skull base and vertebrae: Skull base and craniocervical junction regions appear normal. No evident fracture. No blastic or lytic bone lesions. Soft tissues and spinal canal: Prevertebral soft tissues and predental space regions are normal. There is no cord or canal hematoma evident. No paraspinous lesions appreciable. Disc levels: Disc spaces appear unremarkable. There is no  appreciable nerve root edema or effacement. No disc extrusion or stenosis. Upper chest: Visualized upper lung regions are clear. There is slight bullous disease in the medial left apex. Other: None IMPRESSION: CT head: Brain parenchyma appears unremarkable. No mass or hemorrhage. There are foci of arterial vascular calcification. There is mucosal thickening in several ethmoid air cells. CT cervical spine: No fracture or spondylolisthesis. No nerve root edema or effacement. No disc extrusion or stenosis. Electronically Signed   By: Lowella Grip III M.D.   On: 05/22/2018 09:11    Procedures Procedures (including critical care time)  Medications Ordered in ED Medications  HYDROcodone-acetaminophen (NORCO/VICODIN) 5-325 MG per tablet 1 tablet (has no administration in time range)     Initial Impression / Assessment and Plan / ED Course  I have reviewed the triage vital signs and the nursing notes.  Pertinent labs & imaging results that were available during my care of the patient were reviewed by me and considered in my medical decision making (see chart for details).    Patient with history of mechanical fall 2 days prior to arrival.  X-rays right ribs and CT of head and C-spine are negative for acute injury.  She remains neurovascularly intact.  She is ambulatory with a slow but steady gait.  No focal neuro deficits on exam.  She is appropriate for discharge home, of note she remains hypertensive.  Takes hydrochlorothiazide 25 mg but admits that she does not take it daily.  I have counseled her on the importance of taking her medications daily as directed.  She agrees to take her medication when she gets home she also is requesting a refill which I will provide.  Referral information given for the local clinic to establish primary care.  Return precautions were discussed.   Final Clinical Impressions(s) / ED Diagnoses   Final diagnoses:  Fall, initial encounter  Minor head injury, initial  encounter  Contusion of rib on right side, initial encounter  Medication refill    ED Discharge Orders    None       Kem Parkinson, PA-C 05/22/18 1018    Nat Christen, MD 05/23/18 1248

## 2018-05-22 NOTE — Discharge Instructions (Addendum)
Alternate ice to your chest and neck.  Your blood pressure today was elevated, be sure to take your blood pressure medication daily as directed.  I have listed the local primary care clinic for you to contact to arrange primary care if needed.  Return to the ER for any worsening symptoms.

## 2018-05-22 NOTE — ED Triage Notes (Signed)
Pt reports slipped on a wet ramp at work Monday.  C/O pain in back of head, neck, and r ribs.

## 2018-05-22 NOTE — ED Notes (Signed)
Patient transported to X-ray 

## 2018-05-28 ENCOUNTER — Emergency Department (HOSPITAL_COMMUNITY)
Admission: EM | Admit: 2018-05-28 | Discharge: 2018-05-28 | Disposition: A | Discharge status: Home / Self Care | Payer: Medicaid Other | Attending: Emergency Medicine | Admitting: Emergency Medicine

## 2018-05-28 ENCOUNTER — Emergency Department (HOSPITAL_COMMUNITY): Payer: Medicaid Other

## 2018-05-28 ENCOUNTER — Encounter (HOSPITAL_COMMUNITY): Payer: Self-pay

## 2018-05-28 ENCOUNTER — Other Ambulatory Visit: Payer: Self-pay

## 2018-05-28 DIAGNOSIS — R569 Unspecified convulsions: Secondary | ICD-10-CM | POA: Diagnosis not present

## 2018-05-28 DIAGNOSIS — I10 Essential (primary) hypertension: Secondary | ICD-10-CM | POA: Insufficient documentation

## 2018-05-28 DIAGNOSIS — J4520 Mild intermittent asthma, uncomplicated: Secondary | ICD-10-CM | POA: Diagnosis not present

## 2018-05-28 DIAGNOSIS — F17210 Nicotine dependence, cigarettes, uncomplicated: Secondary | ICD-10-CM | POA: Insufficient documentation

## 2018-05-28 DIAGNOSIS — Z79899 Other long term (current) drug therapy: Secondary | ICD-10-CM | POA: Insufficient documentation

## 2018-05-28 DIAGNOSIS — R0789 Other chest pain: Secondary | ICD-10-CM | POA: Diagnosis not present

## 2018-05-28 DIAGNOSIS — R079 Chest pain, unspecified: Secondary | ICD-10-CM | POA: Diagnosis present

## 2018-05-28 MED ORDER — HYDROCODONE-ACETAMINOPHEN 5-325 MG PO TABS: 1.0000 | ORAL_TABLET | ORAL | 0 refills | Status: DC | PRN

## 2018-05-28 MED ORDER — HYDROCODONE-ACETAMINOPHEN 5-325 MG PO TABS
2.0000 | ORAL_TABLET | Freq: Once | ORAL | Status: AC
  Administered 2018-05-28: 2 via ORAL
  Filled 2018-05-28: qty 2

## 2018-05-28 MED ORDER — BENZONATATE 100 MG PO CAPS: 100.0000 mg | ORAL_CAPSULE | Freq: Three times a day (TID) | ORAL | 0 refills | Status: DC | PRN

## 2018-05-28 NOTE — ED Provider Notes (Signed)
[facility] EMERGENCY DEPARTMENT Provider Note   CSN: [id number] Arrival date & time: 05/28/18  6606     History   Chief Complaint Chief Complaint  Patient presents with  . Fall    HPI Judy Cross is a 52 y.o. female.  HPI  52 year old female presents with right-sided chest pain and cough.  She was seen here on 12/11 after a fall.  She states that since the next day she has been having severe right-sided chest pain, 10/10.  Has been taking Naprosyn without relief.  Pain is present at all times but does get worse with coughing or inspiration.  Over the last 3 days she has had cough but does have productive sputum but she is not sure what color.  No blood.  No fevers or vomiting but does have some dyspnea. No leg swelling.  Past Medical History:  Diagnosis Date  . Allergy   . Arthritis   . Asthma in adult, mild intermittent, uncomplicated 08/10/6008  . Breast nodule 10/12/2015  . 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 Endometrial polyp 04/28/2013  . Fibroids 04/28/2013  . Hot flashes 03/26/2013  . Hypertension   . Irregular bleeding 03/26/2013  . Neuromuscular disorder (HCC)    sciatica  . Seasonal allergies   . Seizures ([place])    last seizure was 2 years ago; unknown etiology. On Keppra.    Patient Active Problem List   Diagnosis Date Noted  . Lumbar degenerative disc disease 01/12/2017  . Essential hypertension 12/07/2016  . Asthma in adult, mild intermittent, uncomplicated 93/23/5573  . Special screening for malignant neoplasms, colon 01/07/2016  . Breast nodule 10/12/2015  . Low back pain 12/08/2013  . Fibroids 04/28/2013  . Hot flashes 03/26/2013  . Enlarged uterus 03/26/2013    Past Surgical History:  Procedure Laterality Date  . ABLATION     with polyp removal.  . CESAREAN SECTION     x 3  . COLONOSCOPY WITH PROPOFOL N/A 01/11/2016   Procedure: COLONOSCOPY WITH PROPOFOL;  Surgeon: Danie Binder, MD;  [facility]: [facility];  Service: Endoscopy;   Laterality: N/A;  800  . EXAMINATION UNDER ANESTHESIA  02/28/2012   Procedure: EXAM UNDER ANESTHESIA;  Surgeon: Donato Heinz, MD;  Location: [facility] ORS;  Service: General;  Laterality: N/A;  . GANGLION CYST EXCISION Left   . HYSTEROSCOPY W/D&C N/A 05/23/2013   Procedure: DILATATION AND CURETTAGE /HYSTEROSCOPY;  Surgeon: Jonnie Kind, MD;  Location: [facility] ORS;  Service: Gynecology;  Laterality: N/A;  . POLYPECTOMY N/A 05/23/2013   Procedure: ENDOMETRIAL POLYP REMOVAL;  Surgeon: Jonnie Kind, MD;  Location: [facility];  Service: Gynecology;  Laterality: N/A;  . SPHINCTEROTOMY  02/28/2012   Procedure: SPHINCTEROTOMY;  Surgeon: Donato Heinz, MD;  Location: [facility];  Service: General;  Laterality: N/A;  Lateral Internal Sphincterotomy  . TRIGGER FINGER RELEASE Left    ring finger  . TUBAL LIGATION       OB History    Gravida  4   Para  3   Term      Preterm      AB  1   Living  3     SAB  1   TAB      Ectopic      Multiple      Live Births  3            Home Medications    Prior to Admission medications   Medication Sig  Start Date End Date Taking? Authorizing Provider  albuterol (PROVENTIL HFA;VENTOLIN HFA) 108 (90 BASE) MCG/ACT inhaler Inhale 1-2 puffs into the lungs every 6 (six) hours as needed for wheezing or shortness of breath.     [provider]  benzonatate (TESSALON) 100 MG capsule Take 1 capsule (100 mg total) by mouth 3 (three) times daily as needed for cough. 05/28/18   Sherwood Gambler, MD  cyclobenzaprine (FLEXERIL) 10 MG tablet Take 1 tablet (10 mg total) by mouth at bedtime. 06/21/17   Raylene Everts, MD  hydrochlorothiazide (HYDRODIURIL) 25 MG tablet Take 1 tablet (25 mg total) by mouth daily. 05/22/18   Triplett, Tammy, PA-C  HYDROcodone-acetaminophen (NORCO) 5-325 MG tablet Take 1 tablet by mouth every 4 (four) hours as needed for severe pain. 05/28/18   Sherwood Gambler, MD  hydrOXYzine (ATARAX/VISTARIL) 25 MG tablet Take 1 tablet (25 mg  total) by mouth every 6 (six) hours as needed for anxiety. 04/11/18   Julianne Rice, MD  ibuprofen (ADVIL,MOTRIN) 600 MG tablet Take 1 tablet (600 mg total) by mouth 3 (three) times daily with meals as needed for moderate pain. 04/11/18   Julianne Rice, MD  naproxen (NAPROSYN) 500 MG tablet Take 1 tablet (500 mg total) by mouth 2 (two) times daily with a meal. 05/22/18   Triplett, Tammy, PA-C    Family History Family History  Problem Relation Age of Onset  . Hypertension Mother   . Arthritis Mother   . COPD Mother   . Diabetes Mother   . Heart disease Mother   . Hypertension Father   . Parkinson's disease Father   . Colon cancer Neg Hx     Social History Social History   Tobacco Use  . Smoking status: Current Some Day Smoker    Packs/day: 0.25    Years: 20.00    Pack years: 5.00    Types: Cigarettes  . Smokeless tobacco: Never Used  . Tobacco comment: smokes 1 cig daily  Substance Use Topics  . Alcohol use: Yes    Comment: occasional  . Drug use: No     Allergies   Lisinopril; Penicillins; and Tramadol   Review of Systems Review of Systems  Constitutional: Negative for fever.  Respiratory: Positive for cough and shortness of breath.   Cardiovascular: Positive for chest pain.  All other systems reviewed and are negative.    Physical Exam Updated Vital Signs BP 137/90 (BP Location: Left Arm)   Pulse 88   Temp 98.2 F (36.8 C) (Oral)   Resp 14   Ht 5\' 1"  (1.549 m)   Wt 58.9 kg   SpO2 100%   BMI 24.54 kg/m   Physical Exam Vitals signs and nursing note reviewed.  Constitutional:      Appearance: Normal appearance. She is well-developed.  HENT:     Head: Normocephalic and atraumatic.     Right Ear: External ear normal.     Left Ear: External ear normal.     Nose: Nose normal.  Eyes:     General:        Right eye: No discharge.        Left eye: No discharge.  Cardiovascular:     Rate and Rhythm: Normal rate and regular rhythm.     Heart  sounds: Normal heart sounds.  Pulmonary:     Effort: Pulmonary effort is normal.     Breath sounds: Normal breath sounds.  Chest:     Chest wall: Tenderness present.  Comments: No bruising to the chest wall. Abdominal:     Palpations: Abdomen is soft.     Tenderness: There is no abdominal tenderness.  Skin:    General: Skin is warm and dry.  Neurological:     Mental Status: She is alert.  Psychiatric:        Mood and Affect: Mood is not anxious.      ED Treatments / Results  Labs (all labs ordered are listed, but only abnormal results are displayed) Labs Reviewed - No data to display  EKG EKG Interpretation  Date/Time:  Tuesday May 28 2018 08:09:11 EST Ventricular Rate:  59 PR Interval:    QRS Duration: 88 QT Interval:  424 QTC Calculation: 420 R Axis:   66 Text Interpretation:  Sinus rhythm Low voltage, precordial leads Probable anteroseptal infarct, old no significant change since Oct 2019 Confirmed by Sherwood Gambler [phone]) on 05/28/2018 8:14:01 AM   Radiology Dg Chest 2 View  Result Date: 05/28/2018 CLINICAL DATA:  Pain following fall EXAM: CHEST - 2 VIEW COMPARISON:  May 22, 2018 FINDINGS: Lungs are clear. Heart size and pulmonary vascularity are normal. No adenopathy. No pneumothorax. No bone lesions. IMPRESSION: No edema or consolidation. Electronically Signed   By: Lowella Grip III M.D.   On: 05/28/2018 08:38    Procedures Procedures (including critical care time)  Medications Ordered in ED Medications  HYDROcodone-acetaminophen (NORCO/VICODIN) 5-325 MG per tablet 2 tablet (2 tablets Oral Given 05/28/18 1937)     Initial Impression / Assessment and Plan / ED Course  I have reviewed the triage vital signs and the nursing notes.  Pertinent labs & imaging results that were available during my care of the patient were reviewed by me and considered in my medical decision making (see chart for details).     Patient's chest wall  tenderness is easily reproducible and I think this is probably chest wall/rib in etiology.  I have a low suspicion for PE or other intrathoracic emergency.  Highly doubt dissection or ACS.  She is feeling better with hydrocodone and I think a brief course of this in addition to NSAIDs is warranted given her degree of pain.  Could have nondisplaced fracture not seen on x-ray or contusion.  However with no obvious pneumothorax or other instability I do not think a CT is warranted.  Discussed treatments at home as well as follow-up and return precautions.  Final Clinical Impressions(s) / ED Diagnoses   Final diagnoses:  Chest wall pain    ED Discharge Orders         Ordered    HYDROcodone-acetaminophen (NORCO) 5-325 MG tablet  Every 4 hours PRN     05/28/18 1002    benzonatate (TESSALON) 100 MG capsule  3 times daily PRN     05/28/18 1002           Sherwood Gambler, MD 05/28/18 1046

## 2018-05-28 NOTE — ED Triage Notes (Signed)
Pt reports she fell last week at work and c/o pain to r ribs.  Reports was evaluated here after the fall.   Pt says pain is no better.

## 2018-05-28 NOTE — ED Provider Notes (Addendum)
Requested do a chart review by charge RN, Magda Paganini due to patient representing the emergency department stating that she left her narcotic prescription here.  Appears patient was prescribed Norco No. 10 today.  Charge RN states that she personally gave the patient the prescription this AM.  [place] controlled substance database was reviewed, it appears patient was given 30 days prescription for Norco (#45) on 05-22-2018 by outside provider.  All questions answered, no further needs at this time. No further Rx given.    Albesa Seen, PA-C 05/28/18 1655    [address] PA-C 05/28/18 1656    Milton Ferguson, MD 05/28/18 2243

## 2018-05-28 NOTE — Discharge Instructions (Signed)
If you develop new or worsening chest pain, shortness of breath, vomiting, weakness or numbness in your arms or legs, back pain, or any other new/concerning symptoms and return to the ER for evaluation.  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

## 2018-05-30 ENCOUNTER — Ambulatory Visit: Payer: BLUE CROSS/BLUE SHIELD | Admitting: Orthopaedic Surgery

## 2018-06-14 IMAGING — DX DG HIP (WITH OR WITHOUT PELVIS) 2-3V*L*
3 series · 3 of 3 positions shown · non-contrast
Comparison: Plain films left hip 02/28/2017.

CLINICAL DATA: Status post fall yesterday. Left hip pain. Initial
encounter.

EXAM:
DG HIP (WITH OR WITHOUT PELVIS) 2-3V LEFT

[pelvis ap]
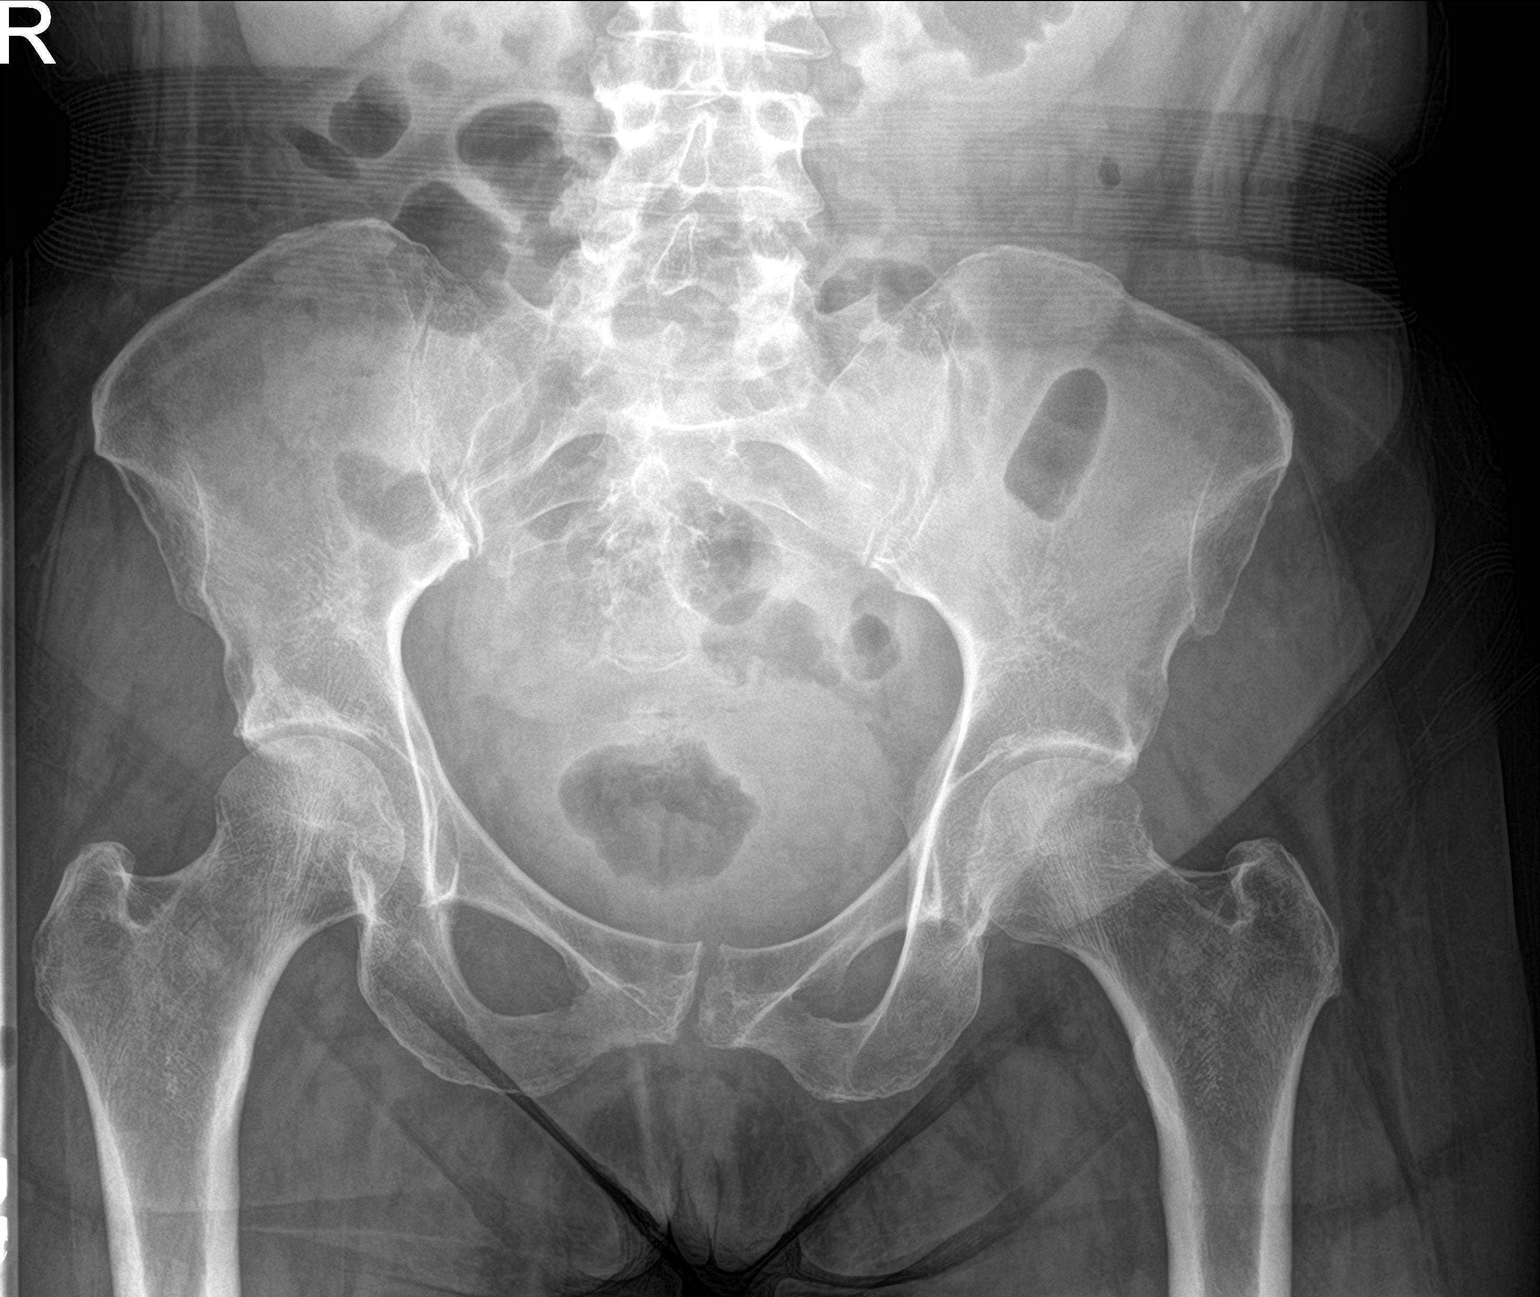

[hip ap]
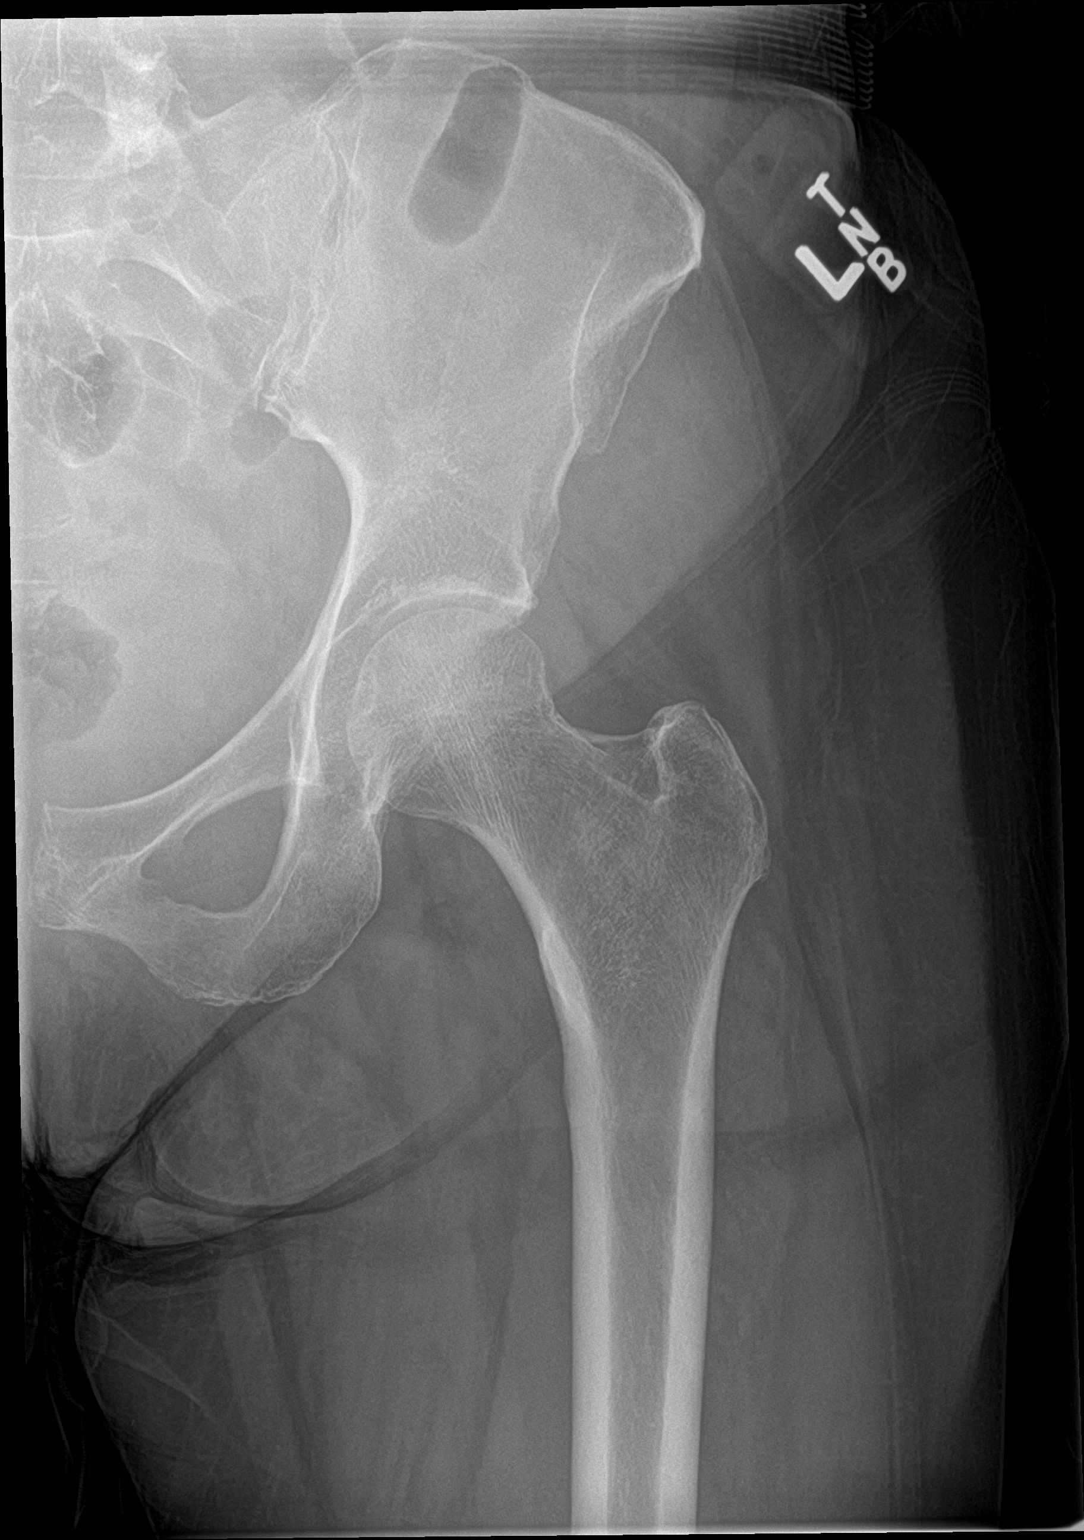

[hip lat]
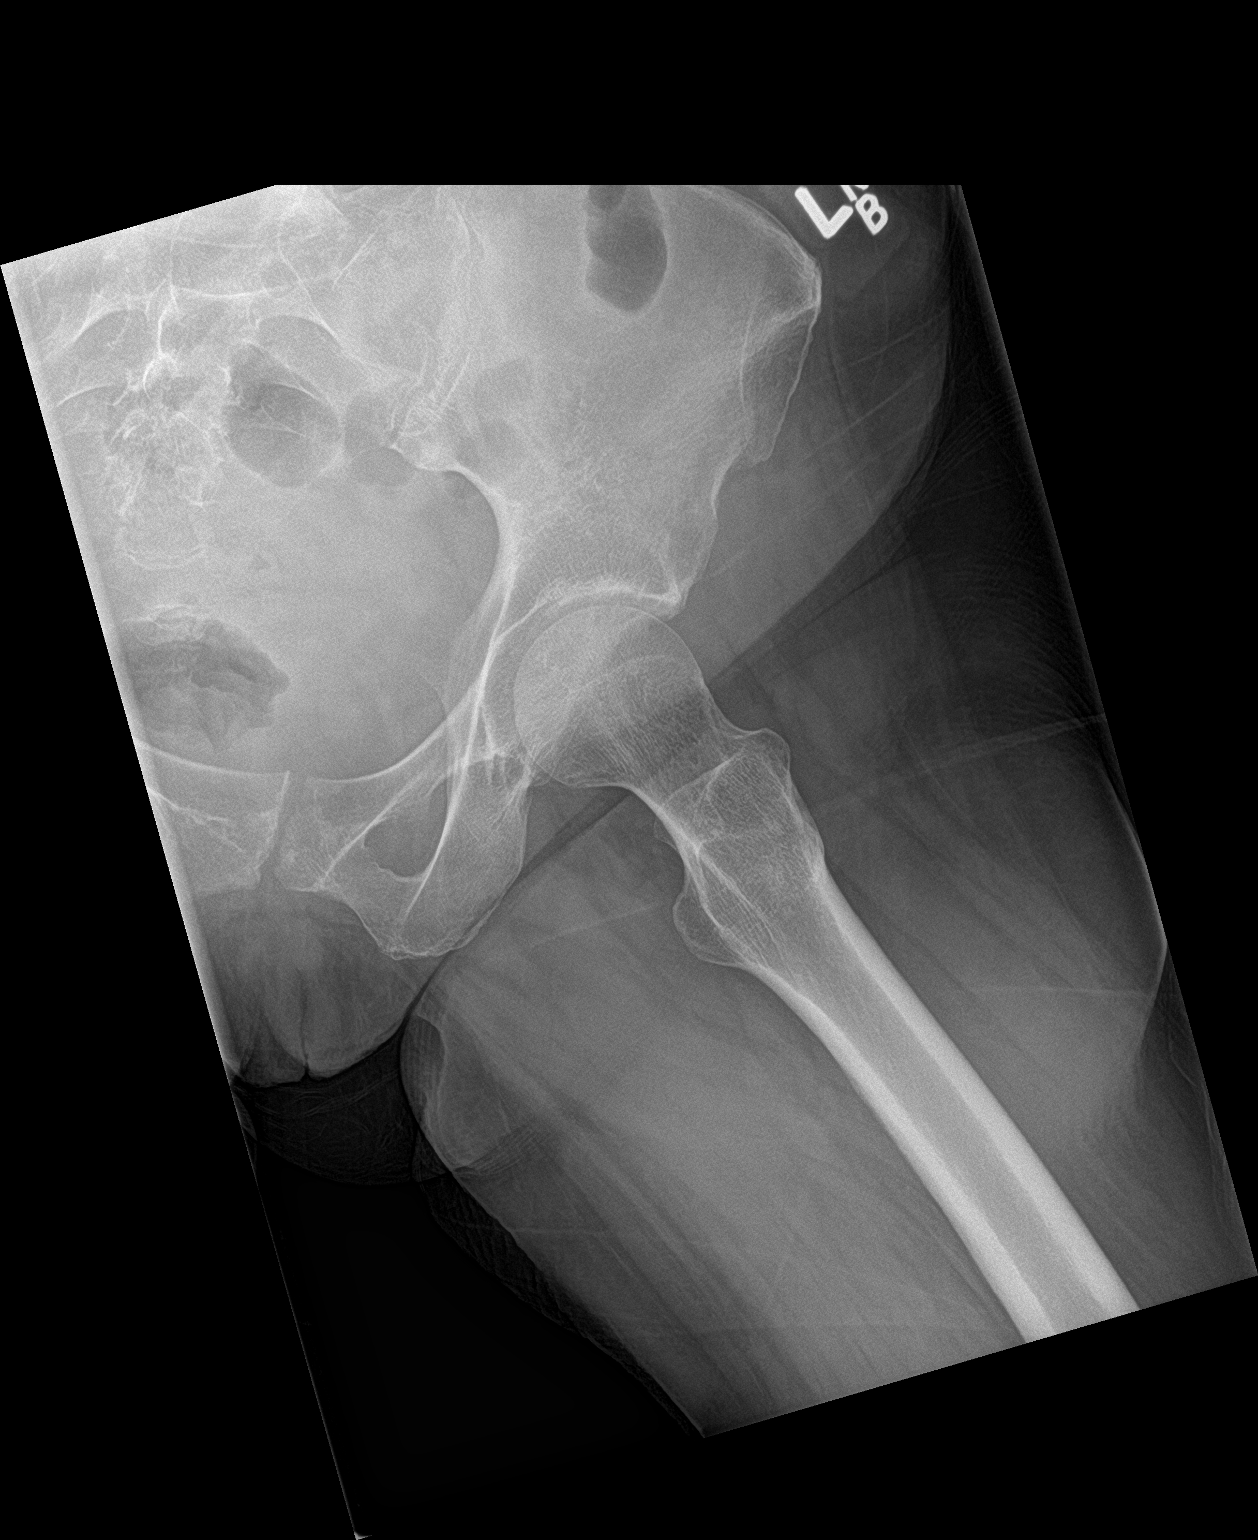

[3 of 3 positions shown; findings below may reference images not displayed]

FINDINGS: There is no evidence of hip fracture or dislocation. There is no
evidence of arthropathy or other focal bone abnormality about the
left hip. Mild to moderate right hip osteoarthritis is noted.
IMPRESSION: Negative left hip.

## 2018-06-20 ENCOUNTER — Other Ambulatory Visit: Payer: Medicaid Other | Admitting: Adult Health

## 2018-08-07 ENCOUNTER — Other Ambulatory Visit (HOSPITAL_COMMUNITY): Payer: Self-pay | Admitting: Internal Medicine

## 2018-08-07 DIAGNOSIS — Z1231 Encounter for screening mammogram for malignant neoplasm of breast: Secondary | ICD-10-CM

## 2018-08-14 ENCOUNTER — Inpatient Hospital Stay (HOSPITAL_COMMUNITY): Admission: RE | Admit: 2018-08-14 | Payer: Medicaid Other | Source: Ambulatory Visit

## 2018-08-21 ENCOUNTER — Other Ambulatory Visit: Payer: Self-pay

## 2018-08-21 ENCOUNTER — Ambulatory Visit (HOSPITAL_COMMUNITY)
Admission: RE | Admit: 2018-08-21 | Discharge: 2018-08-21 | Disposition: A | Discharge status: Home / Self Care | Payer: Medicaid Other | Source: Ambulatory Visit | Attending: Internal Medicine | Admitting: Internal Medicine

## 2018-08-21 DIAGNOSIS — Z1231 Encounter for screening mammogram for malignant neoplasm of breast: Secondary | ICD-10-CM | POA: Diagnosis not present

## 2018-10-28 DIAGNOSIS — M5416 Radiculopathy, lumbar region: Secondary | ICD-10-CM | POA: Diagnosis not present

## 2018-10-28 DIAGNOSIS — S8002XA Contusion of left knee, initial encounter: Secondary | ICD-10-CM | POA: Diagnosis not present

## 2018-11-06 DIAGNOSIS — M25562 Pain in left knee: Secondary | ICD-10-CM | POA: Diagnosis not present

## 2018-11-06 DIAGNOSIS — M25362 Other instability, left knee: Secondary | ICD-10-CM | POA: Diagnosis not present

## 2018-11-06 DIAGNOSIS — M549 Dorsalgia, unspecified: Secondary | ICD-10-CM | POA: Diagnosis not present

## 2018-11-18 DIAGNOSIS — M5416 Radiculopathy, lumbar region: Secondary | ICD-10-CM | POA: Diagnosis not present

## 2018-11-18 DIAGNOSIS — M47816 Spondylosis without myelopathy or radiculopathy, lumbar region: Secondary | ICD-10-CM | POA: Diagnosis not present

## 2018-11-18 DIAGNOSIS — M948X6 Other specified disorders of cartilage, lower leg: Secondary | ICD-10-CM | POA: Diagnosis not present

## 2018-11-18 DIAGNOSIS — M47817 Spondylosis without myelopathy or radiculopathy, lumbosacral region: Secondary | ICD-10-CM | POA: Diagnosis not present

## 2018-11-18 DIAGNOSIS — R936 Abnormal findings on diagnostic imaging of limbs: Secondary | ICD-10-CM | POA: Diagnosis not present

## 2018-11-18 DIAGNOSIS — Z7689 Persons encountering health services in other specified circumstances: Secondary | ICD-10-CM | POA: Diagnosis not present

## 2018-11-18 DIAGNOSIS — M76892 Other specified enthesopathies of left lower limb, excluding foot: Secondary | ICD-10-CM | POA: Diagnosis not present

## 2018-11-18 DIAGNOSIS — M5127 Other intervertebral disc displacement, lumbosacral region: Secondary | ICD-10-CM | POA: Diagnosis not present

## 2018-11-18 DIAGNOSIS — M5126 Other intervertebral disc displacement, lumbar region: Secondary | ICD-10-CM | POA: Diagnosis not present

## 2018-11-18 DIAGNOSIS — M25462 Effusion, left knee: Secondary | ICD-10-CM | POA: Diagnosis not present

## 2018-11-21 DIAGNOSIS — M5416 Radiculopathy, lumbar region: Secondary | ICD-10-CM | POA: Diagnosis not present

## 2018-11-26 ENCOUNTER — Encounter: Payer: Self-pay | Admitting: Orthopaedic Surgery

## 2018-11-26 ENCOUNTER — Ambulatory Visit: Payer: Medicaid Other | Admitting: Orthopaedic Surgery

## 2018-11-26 ENCOUNTER — Other Ambulatory Visit: Payer: Self-pay

## 2018-11-26 VITALS — BP 182/117 | HR 79 | Temp 97.9°F | Ht 61.0 in | Wt 129.0 lb

## 2018-11-26 DIAGNOSIS — F17210 Nicotine dependence, cigarettes, uncomplicated: Secondary | ICD-10-CM

## 2018-11-26 DIAGNOSIS — M545 Low back pain, unspecified: Secondary | ICD-10-CM

## 2018-11-26 DIAGNOSIS — M25562 Pain in left knee: Secondary | ICD-10-CM | POA: Diagnosis not present

## 2018-11-26 DIAGNOSIS — G8929 Other chronic pain: Secondary | ICD-10-CM

## 2018-11-26 MED ORDER — HYDROCODONE-ACETAMINOPHEN 5-325 MG PO TABS: 1.0000 | ORAL_TABLET | ORAL | 0 refills | Status: AC | PRN

## 2018-11-26 NOTE — Progress Notes (Signed)
Patient Judy Cross:JKDT Rodena Medin, female DOB:07-06-1965, 52 y.o. OIZ:[id number]  Chief Complaint  Patient presents with  . Knee Pain  . Back Pain    HPI  Judy Cross is a 53 y.o. female who has had pain in the lower back and left knee.  She has been seen at [facility].  They got MRI of the left knee showing a possible medial meniscus tear.  They could not be definitive.  She has been using a knee sleeve and it still hurts, swells and gives way.  I will have her see Dr. Aline Brochure for this.  She had MRI of the lumbar spine also which showed DJD, mild bilateral foraminal stenosis at L4-L5, facet arthropathy at L3-L4 through L5-S1.  She is no better.  I will have her seen by PT for this.  She may need epidurals.  She has no trauma, no weakness.  She would like some pain medicine.   Body mass index is 24.37 kg/m.  ROS  Review of Systems  HENT: Negative for congestion.   Respiratory: Positive for shortness of breath. Negative for cough.   Cardiovascular: Negative for chest pain and leg swelling.  Endocrine: Negative for cold intolerance.  Musculoskeletal: Positive for arthralgias and gait problem.  Allergic/Immunologic: Negative for environmental allergies.  All other systems reviewed and are negative.   All other systems reviewed and are negative.  The following is a summary of the past history medically, past history surgically, known current medicines, social history and family history.  This information is gathered electronically by the computer from prior information and documentation.  I review this each visit and have found including this information at this point in the chart is beneficial and informative.    Past Medical History:  Diagnosis Date  . Allergy   . Arthritis   . Asthma in adult, mild intermittent, uncomplicated 3/38/2505  . Breast nodule 10/12/2015  . 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 Endometrial polyp 04/28/2013  . Fibroids 04/28/2013  . Hot  flashes 03/26/2013  . Hypertension   . Irregular bleeding 03/26/2013  . Neuromuscular disorder (HCC)    sciatica  . Seasonal allergies   . Seizures ([place])    last seizure was 2 years ago; unknown etiology. On Keppra.    Past Surgical History:  Procedure Laterality Date  . ABLATION     with polyp removal.  . CESAREAN SECTION     x 3  . COLONOSCOPY WITH PROPOFOL N/A 01/11/2016   Procedure: COLONOSCOPY WITH PROPOFOL;  Surgeon: Danie Binder, MD;  [facility];  Service: Endoscopy;  Laterality: N/A;  800  . EXAMINATION UNDER ANESTHESIA  02/28/2012   Procedure: EXAM UNDER ANESTHESIA;  Surgeon: Donato Heinz, MD;  Location: [facility];  Service: General;  Laterality: N/A;  . GANGLION CYST EXCISION Left   . HYSTEROSCOPY W/D&C N/A 05/23/2013   Procedure: DILATATION AND CURETTAGE /HYSTEROSCOPY;  Surgeon: Jonnie Kind, MD;  Location: [facility];  Service: Gynecology;  Laterality: N/A;  . POLYPECTOMY N/A 05/23/2013   Procedure: ENDOMETRIAL POLYP REMOVAL;  Surgeon: Jonnie Kind, MD;  Location: [facility];  Service: Gynecology;  Laterality: N/A;  . SPHINCTEROTOMY  02/28/2012   Procedure: SPHINCTEROTOMY;  Surgeon: Donato Heinz, MD;  Location: [facility];  Service: General;  Laterality: N/A;  Lateral Internal Sphincterotomy  . TRIGGER FINGER RELEASE Left    ring finger  . TUBAL LIGATION      Family History  Problem Relation Age  of Onset  . Hypertension Mother   . Arthritis Mother   . COPD Mother   . Diabetes Mother   . Heart disease Mother   . Hypertension Father   . Parkinson's disease Father   . Colon cancer Neg Hx     Social History Social History   Tobacco Use  . Smoking status: Current Some Day Smoker    Packs/day: 0.25    Years: 20.00    Pack years: 5.00    Types: Cigarettes  . Smokeless tobacco: Never Used  . Tobacco comment: smokes 1 cig daily  Substance Use Topics  . Alcohol use: Yes    Comment: occasional  . Drug use: No    Allergies  Allergen Reactions  .  Lisinopril Swelling    swelling to entire mouth.  . Penicillins Rash    Has patient had a PCN reaction causing immediate rash, facial/tongue/throat swelling, SOB or lightheadedness with hypotension: no - next day Has patient had a PCN reaction causing severe rash involving mucus membranes or skin necrosis: No Has patient had a PCN reaction that required hospitalization No Has patient had a PCN reaction occurring within the last 10 years: Yes If all of the above answers are "NO", then may proceed with Cephalosporin use.   . Tramadol Other (See Comments)    dizziness    Current Outpatient Medications  Medication Sig Dispense Refill  . albuterol (PROVENTIL HFA;VENTOLIN HFA) 108 (90 BASE) MCG/ACT inhaler Inhale 1-2 puffs into the lungs every 6 (six) hours as needed for wheezing or shortness of breath.     . benzonatate (TESSALON) 100 MG capsule Take 1 capsule (100 mg total) by mouth 3 (three) times daily as needed for cough. 21 capsule 0  . cyclobenzaprine (FLEXERIL) 10 MG tablet Take 1 tablet (10 mg total) by mouth at bedtime. 30 tablet 0  . hydrochlorothiazide (HYDRODIURIL) 25 MG tablet Take 1 tablet (25 mg total) by mouth daily. 30 tablet 0  . HYDROcodone-acetaminophen (NORCO/VICODIN) 5-325 MG tablet Take 1 tablet by mouth every 4 (four) hours as needed for up to 5 days for moderate pain. 30 tablet 0  . hydrOXYzine (ATARAX/VISTARIL) 25 MG tablet Take 1 tablet (25 mg total) by mouth every 6 (six) hours as needed for anxiety. 12 tablet 0  . ibuprofen (ADVIL,MOTRIN) 600 MG tablet Take 1 tablet (600 mg total) by mouth 3 (three) times daily with meals as needed for moderate pain. 30 tablet 0  . naproxen (NAPROSYN) 500 MG tablet Take 1 tablet (500 mg total) by mouth 2 (two) times daily with a meal. 20 tablet 0   No current facility-administered medications for this visit.      Physical Exam  Blood pressure (!) 182/117, pulse 79, temperature 97.9 F (36.6 C), height 5\' 1"  (1.549 m), weight 129  lb (58.5 kg).  Constitutional: overall normal hygiene, normal nutrition, well developed, normal grooming, normal body habitus. Assistive device:neoprene sleeve brace  Musculoskeletal: gait and station Limp left, muscle tone and strength are normal, no tremors or atrophy is present.  .  Neurological: coordination overall normal.  Deep tendon reflex/nerve stretch intact.  Sensation normal.  Cranial nerves II-XII intact.   Skin:   Normal overall no scars, lesions, ulcers or rashes. No psoriasis.  Psychiatric: Alert and oriented x 3.  Recent memory intact, remote memory unclear.  Normal mood and affect. Well groomed.  Good eye contact.  Cardiovascular: overall no swelling, no varicosities, no edema bilaterally, normal temperatures of the legs and arms,  no clubbing, cyanosis and good capillary refill.  Lymphatic: palpation is normal.  Left knee with medial pain, swelling, crepitus, ROM 0 to 105, limp left, positive medial McMurray.  NV intact.  Spine/Pelvis examination:  Inspection:  Overall, sacoiliac joint benign and hips nontender; without crepitus or defects.   Thoracic spine inspection: Alignment normal without kyphosis present   Lumbar spine inspection:  Alignment  with normal lumbar lordosis, without scoliosis apparent.   Thoracic spine palpation:  without tenderness of spinal processes   Lumbar spine palpation: without tenderness of lumbar area; without tightness of lumbar muscles    Range of Motion:   Lumbar flexion, forward flexion is normal without pain or tenderness    Lumbar extension is full without pain or tenderness   Left lateral bend is normal without pain or tenderness   Right lateral bend is normal without pain or tenderness   Straight leg raising is normal  Strength & tone: normal   Stability overall normal stability  All other systems reviewed and are negative   The patient has been educated about the nature of the problem(s) and counseled on treatment  options.  The patient appeared to understand what I have discussed and is in agreement with it.  Encounter Diagnoses  Name Primary?  . Chronic midline low back pain without sciatica Yes  . Chronic pain of left knee   . Cigarette nicotine dependence without complication     PLAN Call if any problems.  Precautions discussed.  Continue current medications.   Return to clinic to see Dr. Aline Brochure for arthroscopy possibily and PT for lumbar spine.  I will see her in three weeks.   I have reviewed the [facility] web site prior to prescribing narcotic medicine for this patient.   Electronically Signed [name], MD 6/16/202010:05 AM

## 2018-11-29 ENCOUNTER — Telehealth: Payer: Self-pay | Admitting: Orthopaedic Surgery

## 2018-11-29 NOTE — Telephone Encounter (Signed)
Hydrocodone-Acetaminophen  5/325 mg  Qty 30 Tablets  PATIENT USES [facility]   Patient aware its not due until 21st.

## 2018-12-03 ENCOUNTER — Other Ambulatory Visit: Payer: Self-pay | Admitting: Radiology

## 2018-12-03 MED ORDER — HYDROCODONE-ACETAMINOPHEN 5-325 MG PO TABS: 1.0000 | ORAL_TABLET | ORAL | 0 refills | Status: DC | PRN

## 2018-12-03 NOTE — Telephone Encounter (Signed)
Refill request Hydrocodone, last filled 11/26/18.   I added Rx order per last Rx sig, medication was not listed in her current meds.   [facility] [place].

## 2018-12-04 ENCOUNTER — Ambulatory Visit: Payer: Medicaid Other | Admitting: Orthopedic Surgery

## 2018-12-04 ENCOUNTER — Other Ambulatory Visit: Payer: Self-pay

## 2018-12-04 ENCOUNTER — Encounter: Payer: Self-pay | Admitting: Orthopedic Surgery

## 2018-12-04 VITALS — BP 153/102 | HR 87 | Temp 97.7°F | Ht 61.0 in | Wt 123.0 lb

## 2018-12-04 DIAGNOSIS — M25562 Pain in left knee: Secondary | ICD-10-CM | POA: Diagnosis not present

## 2018-12-04 NOTE — Progress Notes (Signed)
An office referral from Dr. Raliegh Ip  Chief Complaint  Patient presents with  . Knee Pain    surgical consult left knee    53 year old female being treated for back pain and left knee pain currently to undergo physical therapy for her lower back referred for possible knee surgery but her MRI does not show a tear she has patellofemoral chondromalacia and some intra-articular areas of chondral discomfort and degeneration  Complains of left knee pain posterior to anterior with intermittent symptoms occasional giving way.  Pain present for over a year after a fall at a grocery store.  She is on naproxen sometimes ibuprofen.  She has not improved no injections to date   Review of Systems  Constitutional: Negative for fever.  Respiratory: Negative for shortness of breath.   Cardiovascular: Negative for chest pain.  Musculoskeletal: Positive for back pain.  Skin: Negative.      Past Medical History:  Diagnosis Date  . Allergy   . Arthritis   . Asthma in adult, mild intermittent, uncomplicated 10/27/6158  . Breast nodule 10/12/2015  . 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 Endometrial polyp 04/28/2013  . Fibroids 04/28/2013  . Hot flashes 03/26/2013  . Hypertension   . Irregular bleeding 03/26/2013  . Neuromuscular disorder (HCC)    sciatica  . Seasonal allergies   . Seizures ([place])    last seizure was 2 years ago; unknown etiology. On Keppra.    Past Surgical History:  Procedure Laterality Date  . ABLATION     with polyp removal.  . CESAREAN SECTION     x 3  . COLONOSCOPY WITH PROPOFOL N/A 01/11/2016   Procedure: COLONOSCOPY WITH PROPOFOL;  Surgeon: Danie Binder, MD;  [facility];  Service: Endoscopy;  Laterality: N/A;  800  . EXAMINATION UNDER ANESTHESIA  02/28/2012   Procedure: EXAM UNDER ANESTHESIA;  Surgeon: Donato Heinz, MD;  Location: [facility];  Service: General;  Laterality: N/A;  . GANGLION CYST EXCISION Left   . HYSTEROSCOPY W/D&C N/A 05/23/2013    Procedure: DILATATION AND CURETTAGE /HYSTEROSCOPY;  Surgeon: Jonnie Kind, MD;  Location: [facility];  Service: Gynecology;  Laterality: N/A;  . POLYPECTOMY N/A 05/23/2013   Procedure: ENDOMETRIAL POLYP REMOVAL;  Surgeon: Jonnie Kind, MD;  Location: [facility];  Service: Gynecology;  Laterality: N/A;  . SPHINCTEROTOMY  02/28/2012   Procedure: SPHINCTEROTOMY;  Surgeon: Donato Heinz, MD;  Location: [facility];  Service: General;  Laterality: N/A;  Lateral Internal Sphincterotomy  . TRIGGER FINGER RELEASE Left    ring finger  . TUBAL LIGATION      Family History  Problem Relation Age of Onset  . Hypertension Mother   . Arthritis Mother   . COPD Mother   . Diabetes Mother   . Heart disease Mother   . Hypertension Father   . Parkinson's disease Father   . Colon cancer Neg Hx    Social History   Tobacco Use  . Smoking status: Current Some Day Smoker    Packs/day: 0.25    Years: 20.00    Pack years: 5.00    Types: Cigarettes  . Smokeless tobacco: Never Used  . Tobacco comment: smokes 1 cig daily  Substance Use Topics  . Alcohol use: Yes    Comment: occasional  . Drug use: No    Allergies  Allergen Reactions  . Lisinopril Swelling    swelling to entire mouth.  . Penicillins Rash    Has patient  had a PCN reaction causing immediate rash, facial/tongue/throat swelling, SOB or lightheadedness with hypotension: no - next day Has patient had a PCN reaction causing severe rash involving mucus membranes or skin necrosis: No Has patient had a PCN reaction that required hospitalization No Has patient had a PCN reaction occurring within the last 10 years: Yes If all of the above answers are "NO", then may proceed with Cephalosporin use.   . Tramadol Other (See Comments)    dizziness    Current Meds  Medication Sig  . albuterol (PROVENTIL HFA;VENTOLIN HFA) 108 (90 BASE) MCG/ACT inhaler Inhale 1-2 puffs into the lungs every 6 (six) hours as needed for wheezing or shortness of breath.    . cyclobenzaprine (FLEXERIL) 10 MG tablet Take 1 tablet (10 mg total) by mouth at bedtime.  . hydrochlorothiazide (HYDRODIURIL) 25 MG tablet Take 1 tablet (25 mg total) by mouth daily.  Marland Kitchen HYDROcodone-acetaminophen (NORCO/VICODIN) 5-325 MG tablet Take 1 tablet by mouth every 4 (four) hours as needed for moderate pain.  . hydrOXYzine (ATARAX/VISTARIL) 25 MG tablet Take 1 tablet (25 mg total) by mouth every 6 (six) hours as needed for anxiety.  Marland Kitchen ibuprofen (ADVIL,MOTRIN) 600 MG tablet Take 1 tablet (600 mg total) by mouth 3 (three) times daily with meals as needed for moderate pain.  . naproxen (NAPROSYN) 500 MG tablet Take 1 tablet (500 mg total) by mouth 2 (two) times daily with a meal.    BP (!) 153/102   Pulse 87   Temp 97.7 F (36.5 C)   Ht 5\' 1"  (1.549 m)   Wt 123 lb (55.8 kg)   BMI 23.24 kg/m   Physical Exam Vitals signs and nursing note reviewed.  Constitutional:      Appearance: Normal appearance.  Neurological:     Mental Status: She is alert and oriented to person, place, and time.  Psychiatric:        Mood and Affect: Mood normal.     Right Knee Exam   Muscle Strength  The patient has normal right knee strength.  Tenderness  The patient is experiencing no tenderness.   Range of Motion  Extension: normal  Flexion: normal   Tests  McMurray:  Medial - negative Lateral - negative Varus: negative Valgus: negative Drawer:  Anterior - negative    Posterior - negative  Other  Erythema: absent Scars: absent Sensation: normal Pulse: present Swelling: none   Left Knee Exam   Muscle Strength  The patient has normal left knee strength.  Tenderness  Left knee tenderness location: Nonspecific peripatellar tenderness.  Range of Motion  Extension: normal  Flexion: normal   Tests  McMurray:  Medial - negative Lateral - negative Varus: negative Valgus: negative Drawer:  Anterior - negative     Posterior - negative  Other  Erythema: absent Scars:  absent Sensation: normal Pulse: present Swelling: none  Comments:  Crepitance and positive pain with patellofemoral compression and resistance testing        MEDICAL DECISION SECTION  Xrays were done at 4 view knee was done on March 5 The x-ray shows that the joint spaces are well-preserved no effusion  MRI report is in the medical record and there is no definable meniscal tear  Encounter Diagnosis  Name Primary?  . Knee pain, left anterior Yes    PLAN: (Rx., injectx, surgery, frx, mri/ct) Inject left knee  Procedure note left knee injection   verbal consent was obtained to inject left knee joint  Timeout was completed  to confirm the site of injection  The medications used were 40 mg of Depo-Medrol and 1% lidocaine 3 cc  Anesthesia was provided by ethyl chloride and the skin was prepped with alcohol.  After cleaning the skin with alcohol a 20-gauge needle was used to inject the left knee joint. There were no complications. A sterile bandage was applied.   Patient will complete her 6-week course of physical therapy  See Dr. Luna Glasgow in 6 weeks  No surgery needed at this time  No orders of the defined types were placed in this encounter.   Arther Abbott, MD  12/04/2018 1:58 PM

## 2018-12-09 ENCOUNTER — Encounter (HOSPITAL_COMMUNITY): Payer: Self-pay

## 2018-12-09 ENCOUNTER — Telehealth: Payer: Self-pay

## 2018-12-09 ENCOUNTER — Ambulatory Visit (HOSPITAL_COMMUNITY): Payer: Medicaid Other | Attending: Orthopaedic Surgery

## 2018-12-09 ENCOUNTER — Other Ambulatory Visit: Payer: Self-pay

## 2018-12-09 DIAGNOSIS — M6281 Muscle weakness (generalized): Secondary | ICD-10-CM | POA: Diagnosis present

## 2018-12-09 DIAGNOSIS — M545 Low back pain, unspecified: Secondary | ICD-10-CM

## 2018-12-09 DIAGNOSIS — G8929 Other chronic pain: Secondary | ICD-10-CM | POA: Insufficient documentation

## 2018-12-09 DIAGNOSIS — R29898 Other symptoms and signs involving the musculoskeletal system: Secondary | ICD-10-CM | POA: Insufficient documentation

## 2018-12-09 MED ORDER — HYDROCODONE-ACETAMINOPHEN 5-325 MG PO TABS: 1.0000 | ORAL_TABLET | ORAL | 0 refills | Status: DC | PRN

## 2018-12-09 NOTE — Telephone Encounter (Signed)
Hydrocodone-Acetaminophen 5/325mg  Qty 28 Tablets  PATIENT USES [facility] 

## 2018-12-09 NOTE — Therapy (Signed)
[place], [place], [address] Phone: [phone]   Fax:  [phone]  Physical Therapy Evaluation  Patient Details  Name: Judy Cross MRN: [id number] Date of Birth: 31-Dec-1965 Referring Provider (PT): [name], MD   Encounter Date: 12/09/2018  PT End of Session - 12/09/18 1145    Visit Number  1    Number of Visits  4    Date for PT Re-Evaluation  01/27/19   Reassess on 12/30/18\   Authorization Type  Medicaid - initial auth submitted 12/09/18 for 1x/week for 3 weeks (made cert good to cover first 3 weeks of visits +4 additional weeks once able to requrest more visits from [facility])    Authorization Time Period  12/09/18 to 01/27/19    Authorization - Visit Number  0    Authorization - Number of Visits  3    PT Start Time  1004    PT Stop Time  1039    PT Time Calculation (min)  35 min    Activity Tolerance  Patient tolerated treatment well    Behavior During Therapy  [facility] for tasks assessed/performed       Past Medical History:  Diagnosis Date  . Allergy   . Arthritis   . Asthma in adult, mild intermittent, uncomplicated 9/92/4268  . Breast nodule 10/12/2015  . 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 Endometrial polyp 04/28/2013  . Fibroids 04/28/2013  . Hot flashes 03/26/2013  . Hypertension   . Irregular bleeding 03/26/2013  . Neuromuscular disorder (HCC)    sciatica  . Seasonal allergies   . Seizures ([place])    last seizure was 2 years ago; unknown etiology. On Keppra.    Past Surgical History:  Procedure Laterality Date  . ABLATION     with polyp removal.  . CESAREAN SECTION     x 3  . COLONOSCOPY WITH PROPOFOL N/A 01/11/2016   Procedure: COLONOSCOPY WITH PROPOFOL;  Surgeon: Danie Binder, MD;  Location: [facility];  Service: Endoscopy;  Laterality: N/A;  800  . EXAMINATION UNDER ANESTHESIA  02/28/2012   Procedure: EXAM UNDER ANESTHESIA;  Surgeon: Donato Heinz, MD;  Location: [facility]  ORS;  Service: General;  Laterality: N/A;  . GANGLION CYST EXCISION Left   . HYSTEROSCOPY W/D&C N/A 05/23/2013   Procedure: DILATATION AND CURETTAGE /HYSTEROSCOPY;  Surgeon: Jonnie Kind, MD;  Location: [facility] ORS;  Service: Gynecology;  Laterality: N/A;  . POLYPECTOMY N/A 05/23/2013   Procedure: ENDOMETRIAL POLYP REMOVAL;  Surgeon: Jonnie Kind, MD;  Location: [facility] ORS;  Service: Gynecology;  Laterality: N/A;  . SPHINCTEROTOMY  02/28/2012   Procedure: SPHINCTEROTOMY;  Surgeon: Donato Heinz, MD;  Location: [facility] ORS;  Service: General;  Laterality: N/A;  Lateral Internal Sphincterotomy  . TRIGGER FINGER RELEASE Left    ring finger  . TUBAL LIGATION      There were no vitals filed for this visit.   Subjective Assessment - 12/09/18 1007    Subjective  Pt reports having 3 herniated discs and states that one is on the nerve. She states that she also thought she had some torn cartilage in her knee but Dr. Aline Brochure told her that nothing was torn and she did not need surgery. She states that she had LBP for years of insidious onset. She reports that her pain is only on the L side of her back and runs down her entire L leg. She states that her  toes are tingling all the time and she also reports numbness if she sits for a long time. She states that sitting aggravates her pain and gettin gup and moving helps her pain. She states that her leg pain tends to start after her back pain worsens. 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 Sitting, standing, and lifting are the most difficult things for her to do due to her back pain.    Limitations  Standing;Sitting;Lifting    How long can you sit comfortably?  10-15 mins    How long can you stand comfortably?  10-69mins    How long can you walk comfortably?  relieving factor    Patient Stated Goals  get rid of this pain    Currently in Pain?  Yes    Pain Score  7     Pain Location  Back    Pain Orientation   Lower;Left    Pain Descriptors / Indicators  Aching;Dull;Spasm    Pain Type  Chronic pain    Pain Radiating Towards  down to L buttock    Pain Onset  More than a month ago    Pain Frequency  Intermittent    Aggravating Factors   sitting, standing, lifting    Pain Relieving Factors  walking    Effect of Pain on Daily Activities  increase         [facility] PT Assessment - 12/09/18 0001      Assessment   Medical Diagnosis  Chronic midline LBP    Referring Provider (PT)  [name], MD    Onset Date/Surgical Date  --   years ago   Next MD Visit  01/16/2019    Prior Therapy  yes for same issue about 2 years ago      Balance Screen   Has the patient fallen in the past 6 months  Yes    How many times?  1   when her client went to fall and pulled her down with her   Has the patient had a decrease in activity level because of a fear of falling?   No    Is the patient reluctant to leave their home because of a fear of falling?   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       Observation/Other Assessments   Focus on Therapeutic Outcomes (FOTO)   n/a      Sensation   Light Touch  Impaired Detail   more sensitive on R throughout all     Functional Tests   Functional tests  Sit to Stand      Sit to Stand   Comments  30sec STS: 12 reps, no UE, from chair      Posture/Postural Control   Posture/Postural Control  Postural limitations    Postural Limitations  Rounded Shoulders;Forward head      Deep Tendon Reflexes   DTR Assessment Site  Patella;Achilles    Patella DTR  2+    Achilles DTR  2+      ROM / Strength   AROM / PROM / Strength  AROM;Strength      AROM   AROM Assessment Site  Lumbar    Lumbar Flexion  to floor    Lumbar Extension  75% limited    Lumbar - Right Side Bend   to knee jt - pulling on L    Lumbar - Left Side Bend  to knee jt -  tightness on L    Lumbar - Right Rotation  WFL    Lumbar - Left Rotation  WFL - tighter on L      Strength   Strength Assessment Site  Hip;Knee;Ankle    Right Hip Flexion  4+/5    Right Hip Extension  4/5    Right Hip ABduction  4/5    Left Hip Flexion  4+/5    Left Hip Extension  4-/5    Left Hip ABduction  4/5    Right Knee Flexion  4+/5    Right Knee Extension  5/5    Left Knee Flexion  4+/5    Left Knee Extension  4+/5    Right Ankle Dorsiflexion  4+/5    Left Ankle Dorsiflexion  4+/5      Flexibility   Soft Tissue Assessment /Muscle Length  yes    Hamstrings  WFL, L slightly tighter than R    Quadriceps  +E      Palpation   Spinal mobility  lumbar spine relatively WNL with CPAs, with recreation of with CPAs to L3-4 which centralized with prolonged bout; thoracic spine generally stiff and hypomobile throughout    Palpation comment  mod-max restrictions throughout lumbar paraspinals and other surrouding mm on L side as well as L glute med/max, and piriformis -- all of which recreated LLE pain to knee and it centralized to point of palpation with prolonged MFR      Special Tests    Special Tests  Lumbar    Lumbar Tests  Slump Test;Straight Leg Raise      Slump test   Findings  Negative    Comment  bil      Straight Leg Raise   Findings  Negative    Comment  bil      Ambulation/Gait   Ambulation Distance (Feet)  --      Balance   Balance Assessed  Yes      Static Standing Balance   Static Standing - Balance Support  No upper extremity supported    Static Standing Balance -  Activities   Single Leg Stance - Right Leg;Single Leg Stance - Left Leg    Static Standing - Comment/# of Minutes  R:17.3 sec, L:16 sec          Objective measurements completed on examination: See above findings.        PT Education - 12/09/18 1145    Education Details  exam findings, HEP, POC    Person(s)  Educated  Patient    Methods  Explanation;Demonstration;Handout    Comprehension  Verbalized understanding       PT Short Term Goals - 12/09/18 1154      PT SHORT TERM GOAL #1   Title  Pt will be independent with HEP and perform consistently in order to promote reduced pain.    Time  3    Period  Weeks    Status  New    Target Date  12/30/18      PT SHORT TERM GOAL #2   Title  Pt will be able to perform bil SLS for 30 sec in order to demo improved core and functional hip strength in order to maximize her standing tolerance and  work duties.    Time  3    Period  Weeks    Status  New      PT SHORT TERM GOAL #3   Title  Pt will be able to perform 15STS during 30sec chair rise test without pain to demo improved functional hip strength and improved tolerance to functional mobility.    Time  3    Period  Weeks    Status  New        PT Long Term Goals - 12/09/18 1157      PT LONG TERM GOAL #1   Title  Pt will have improved MMT by 1/2 grade throughout all mm groups tested in order to reduce her overall LBP and maximize her tolerance to functional tasks.    Time  7    Period  Weeks    Status  New    Target Date  01/27/19      PT LONG TERM GOAL #2   Title  Pt will have reduced soft tissue restrictions to moderate throughout lumbar and gluteal mm and without referred pain down LLE to demo improved overall mm restrictions and reduce her overall LBP.    Time  7    Period  Weeks    Status  New      PT LONG TERM GOAL #3   Title  Pt will report being able to sit for 30 mins or > with 3/10 LBP or < to demo improved posture and improved tolerance to functional tasks.    Time  7    Period  Weeks    Status  New      PT LONG TERM GOAL #4   Title  Pt will report being able to stand for 19mins or > with 3/10 LBP or < to demo improved tolerance to functional tasks and maximize her ability to perform work and [facility] duties with less pain.    Time  7    Period  Weeks    Status  New              Plan - 12/09/18 1147    Clinical Impression Statement  Pt is pleasant 53YO F who presents to OPPT with c/o chronic LBP with LLE pain and n/t. Pt currently presents with deficits in BLE, functional, and core strength as well as deficits in overall flexibility, mm restrictions/spasms, with mild deficits in hip mobility as well as thoracic spine mobility. Pt reporting that slump test recreated her pain, however, when she performed cervical extension, it actually increased her pain versus decreasing it. Her SLR was negative as well as it did not recreate her LLE pain or LBP. Pt reported that palpation to L lumbar paraspinals/thoracolumbar fascia/common insertion recreated her same pain down LLE which centralized with prolonged MFR. She also reported that palpation to L glute med, max, and piriformis recreated her same pain, which again centralized to point of palpation with prolonged MFR. PT feels pt's pain is more likely MSK in nature vs neuromuscular/radicular pain from lower back. PT needs skilled PT intervention to address these impairments in order to reduce her pain and promote return to PLOF.    Personal Factors and Comorbidities  Age;Comorbidity 3+;Past/Current Experience    Comorbidities  see above    Examination-Activity Limitations  Lift;Stand;Sit    Examination-Participation Restrictions  Cleaning;Other   work duties   Merchant [employer] officer  Stable/Uncomplicated    Designer, jewellery  Low    Rehab Potential  Good    PT Frequency  1x / week    PT Duration  3 weeks    PT Treatment/Interventions  ADLs/Self Care Home Management;Aquatic Therapy;Cryotherapy;Electrical Stimulation;Moist Heat;Traction;Gait training;Stair training;Functional mobility training;Therapeutic activities;Therapeutic exercise;Balance training;Neuromuscular re-education;Cognitive remediation;Patient/family education;Manual techniques;Passive range of motion;Dry needling;Energy  conservation;Taping;Spinal Manipulations;Joint Manipulations    PT Next Visit Plan  review goals and HEP; stretching for lower back and hip mm, hip, core, functional strengthening, STM and joint mobs for pain control    PT Home Exercise Plan  eval: SKTC, supine piriformis stretch, supine HS stretch/sciatic nerve glides    Consulted and Agree with Plan of Care  Patient       Patient will benefit from skilled therapeutic intervention in order to improve the following deficits and impairments:  Decreased activity tolerance, Decreased balance, Decreased strength, Hypomobility, Increased fascial restricitons, Increased muscle spasms, Impaired flexibility, Improper body mechanics, Postural dysfunction, Pain, Decreased range of motion  Visit Diagnosis: 1. Chronic left-sided low back pain without sciatica   2. Muscle weakness (generalized)   3. Other symptoms and signs involving the musculoskeletal system        Problem List Patient Active Problem List   Diagnosis Date Noted  . Lumbar degenerative disc disease 01/12/2017  . Essential hypertension 12/07/2016  . Asthma in adult, mild intermittent, uncomplicated 94/76/5465  . Special screening for malignant neoplasms, colon 01/07/2016  . Breast nodule 10/12/2015  . Low back pain 12/08/2013  . Fibroids 04/28/2013  . Hot flashes 03/26/2013  . Enlarged uterus 03/26/2013         Geraldine Solar PT, DPT    [place] [address] [place], [place], [address] Phone: [phone]   Fax:  [phone]  Name: PATTI SHORB MRN: [id number] Date of Birth: June 25, 1965

## 2018-12-16 ENCOUNTER — Telehealth: Payer: Self-pay | Admitting: Orthopaedic Surgery

## 2018-12-16 NOTE — Telephone Encounter (Signed)
Patient requests refill on Hydrocodone/Acetaminophen 5-325 mgs.  Qty  28  Sig: Take 1 tablet by mouth every 4 (four) hours as needed for moderate pain.  Patient states she uses [facility]

## 2018-12-17 ENCOUNTER — Encounter (HOSPITAL_COMMUNITY): Payer: Self-pay

## 2018-12-17 ENCOUNTER — Other Ambulatory Visit: Payer: Self-pay

## 2018-12-17 ENCOUNTER — Ambulatory Visit (HOSPITAL_COMMUNITY): Payer: Medicaid Other | Attending: Orthopaedic Surgery

## 2018-12-17 DIAGNOSIS — G8929 Other chronic pain: Secondary | ICD-10-CM | POA: Insufficient documentation

## 2018-12-17 DIAGNOSIS — M545 Low back pain, unspecified: Secondary | ICD-10-CM

## 2018-12-17 DIAGNOSIS — R29898 Other symptoms and signs involving the musculoskeletal system: Secondary | ICD-10-CM

## 2018-12-17 DIAGNOSIS — M6281 Muscle weakness (generalized): Secondary | ICD-10-CM | POA: Diagnosis not present

## 2018-12-17 MED ORDER — HYDROCODONE-ACETAMINOPHEN 5-325 MG PO TABS: 1.0000 | ORAL_TABLET | ORAL | 0 refills | Status: DC | PRN

## 2018-12-17 NOTE — Patient Instructions (Signed)
Lower Trunk Rotation Stretch    Keeping back flat and feet together, rotate knees to left side. Hold 10 seconds. Repeat 5 times per set. Do 2 sets per day.  http://orth.exer.us/123   Copyright  VHI. All rights reserved.   Bridge    Lie back, legs bent. Inhale, pressing hips up. Keeping ribs in, lengthen lower back. Exhale, rolling down along spine from top. Repeat 10 times. Do 2 sessions per day.  http://pm.exer.us/55   Copyright  VHI. All rights reserved.   Piriformis Stretch, Supine    Lie supine, one ankle crossed onto opposite knee. Holding bottom leg behind knee, gently pull legs toward chest until stretch is felt in buttock of top leg. Hold 30 seconds. For deeper stretch gently push top knee away from body.  Repeat 3 times per session. Do 2 sessions per day.  Copyright  VHI. All rights reserved.
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
## 2018-12-23 ENCOUNTER — Telehealth: Payer: Self-pay

## 2018-12-23 NOTE — Telephone Encounter (Signed)
Hydrocodone-Acetaminophen  5/325 mg  Qty  26 Tablets  PATIENT USES [facility]

## 2018-12-24 ENCOUNTER — Encounter (HOSPITAL_COMMUNITY): Payer: Self-pay

## 2018-12-24 ENCOUNTER — Other Ambulatory Visit: Payer: Self-pay

## 2018-12-24 ENCOUNTER — Ambulatory Visit (HOSPITAL_COMMUNITY): Payer: Medicaid Other

## 2018-12-24 ENCOUNTER — Telehealth: Payer: Self-pay | Admitting: Orthopaedic Surgery

## 2018-12-24 DIAGNOSIS — R29898 Other symptoms and signs involving the musculoskeletal system: Secondary | ICD-10-CM

## 2018-12-24 DIAGNOSIS — G8929 Other chronic pain: Secondary | ICD-10-CM

## 2018-12-24 DIAGNOSIS — M6281 Muscle weakness (generalized): Secondary | ICD-10-CM

## 2018-12-24 DIAGNOSIS — M545 Low back pain, unspecified: Secondary | ICD-10-CM

## 2018-12-24 MED ORDER — HYDROCODONE-ACETAMINOPHEN 5-325 MG PO TABS: ORAL_TABLET | ORAL | 0 refills | Status: DC

## 2018-12-24 NOTE — Telephone Encounter (Signed)
Mileidy's last refill on Hydrocodone/Acetaminophen was sent in as 56 pills.  Her insurance is Medicaid and will only pay for 1 week or 28 tablets at a time.  [facility] [place]

## 2018-12-24 NOTE — Therapy (Signed)
[place], [place], [address] Phone: [phone]   Fax:  [phone]  Physical Therapy Treatment  Patient Details  Name: Judy Cross MRN: [id number] Date of Birth: [birth year] Referring Provider (PT): [name], MD   Encounter Date: 12/24/2018  PT End of Session - 12/24/18 0814    Visit Number  3    Number of Visits  4    Date for PT Re-Evaluation  01/27/19   Minireassess on 12/30/18   Authorization Type  Medicaid - approved authorization 6/30-->12/30/18 1x/week for 3 weeks (made cert good to cover first 3 weeks of visits +4 additional weeks once able to requrest more visits from [facility])    Authorization Time Period  12/09/18 to 01/27/19    Authorization - Visit Number  2    Authorization - Number of Visits  3    PT Start Time  0815    PT Stop Time  0845    PT Time Calculation (min)  30 min    Activity Tolerance  Patient tolerated treatment well;Patient limited by pain    Behavior During Therapy  [facility] for tasks assessed/performed       Past Medical History:  Diagnosis Date  . Allergy   . Arthritis   . Asthma in adult, mild intermittent, uncomplicated 2/45/8099  . Breast nodule 10/12/2015  . 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 Endometrial polyp 04/28/2013  . Fibroids 04/28/2013  . Hot flashes 03/26/2013  . Hypertension   . Irregular bleeding 03/26/2013  . Neuromuscular disorder (HCC)    sciatica  . Seasonal allergies   . Seizures ([place])    last seizure was 2 years ago; unknown etiology. On Keppra.    Past Surgical History:  Procedure Laterality Date  . ABLATION     with polyp removal.  . CESAREAN SECTION     x 3  . COLONOSCOPY WITH PROPOFOL N/A 01/11/2016   Procedure: COLONOSCOPY WITH PROPOFOL;  Surgeon: Danie Binder, MD;  [facility];  Service: Endoscopy;  Laterality: N/A;  800  . EXAMINATION UNDER ANESTHESIA  02/28/2012   Procedure: EXAM UNDER ANESTHESIA;  Surgeon: Donato Heinz, MD;  Location: [facility] ORS;  Service: General;  Laterality: N/A;  . GANGLION CYST EXCISION Left   . HYSTEROSCOPY W/D&C N/A 05/23/2013   Procedure: DILATATION AND CURETTAGE /HYSTEROSCOPY;  Surgeon: Jonnie Kind, MD;  Location: [facility];  Service: Gynecology;  Laterality: N/A;  . POLYPECTOMY N/A 05/23/2013   Procedure: ENDOMETRIAL POLYP REMOVAL;  Surgeon: Jonnie Kind, MD;  Location: [facility] OR[facility];  Service: Gynecology;  Laterality: N/A;  . SPHINCTEROTOMY  02/28/2012   Procedure: SPHINCTEROTOMY;  Surgeon: Donato Heinz, MD;  Location: [facility] ORS;  Service: General;  Laterality: N/A;  Lateral Internal Sphincterotomy  . TRIGGER FINGER RELEASE Left    ring finger  . TUBAL LIGATION      There were no vitals filed for this visit.  Subjective Assessment - 12/24/18 0814    Subjective  Pt reports exercises have been going well. Pt reports pain slowly came on throughout the day yesterday. Pt reports she had to leave work early yesterday to go home and lay down it hurt so bad. Pt reports she is contacting her doctor to ask about steroid injection.    Limitations  Standing;Sitting;Lifting    How long can you sit comfortably?  10-15 mins    How long can you stand comfortably?  10-67mins  How long can you walk comfortably?  relieving factor    Patient Stated Goals  get rid of this pain    Currently in Pain?  Yes    Pain Score  10-Worst pain ever    Pain Location  Back    Pain Orientation  Lower;Left    Pain Descriptors / Indicators  Sharp    Pain Type  Chronic pain    Pain Radiating Towards  down to L foot, toes tingling    Pain Onset  More than a month ago    Aggravating Factors   sitting, standing, lifting    Pain Relieving Factors  walking    Effect of Pain on Daily Activities  increase          [facility] Adult PT Treatment/Exercise - 12/24/18 0001      Lumbar Exercises: Stretches   Active Hamstring Stretch  Left;5 reps;30 seconds    Active Hamstring Stretch Limitations  supine    Single  Knee to Chest Stretch  5 reps;30 seconds    Single Knee to Chest Stretch Limitations  Left    Figure 4 Stretch  3 reps;30 seconds    Figure 4 Stretch Limitations  BLE      Lumbar Exercises: Supine   Bridge  15 reps      Lumbar Exercises: Sidelying   Clam  Left;10 reps    Clam Limitations  Reverse clams, x10 reps      Manual Therapy   Manual Therapy  Soft tissue mobilization    Manual therapy comments  Manual complete separate than rest of tx    Soft tissue mobilization  instrument assisted STM using green ball to L glute and piriformis musculature        PT Education - 12/24/18 0829    Education Details  Exercise technique, updated HEP    Person(s) Educated  Patient    Methods  Explanation;Demonstration;Handout    Comprehension  Verbalized understanding;Returned demonstration       PT Short Term Goals - 12/09/18 1154      PT SHORT TERM GOAL #1   Title  Pt will be independent with HEP and perform consistently in order to promote reduced pain.    Time  3    Period  Weeks    Status  New    Target Date  12/30/18      PT SHORT TERM GOAL #2   Title  Pt will be able to perform bil SLS for 30 sec in order to demo improved core and functional hip strength in order to maximize her standing tolerance and work duties.    Time  3    Period  Weeks    Status  New      PT SHORT TERM GOAL #3   Title  Pt will be able to perform 15STS during 30sec chair rise test without pain to demo improved functional hip strength and improved tolerance to functional mobility.    Time  3    Period  Weeks    Status  New        PT Long Term Goals - 12/09/18 1157      PT LONG TERM GOAL #1   Title  Pt will have improved MMT by 1/2 grade throughout all mm groups tested in order to reduce her overall LBP and maximize her tolerance to functional tasks.    Time  7    Period  Weeks    Status  New  Target Date  01/27/19      PT LONG TERM GOAL #2   Title  Pt will have reduced soft tissue  restrictions to moderate throughout lumbar and gluteal mm and without referred pain down LLE to demo improved overall mm restrictions and reduce her overall LBP.    Time  7    Period  Weeks    Status  New      PT LONG TERM GOAL #3   Title  Pt will report being able to sit for 30 mins or > with 3/10 LBP or < to demo improved posture and improved tolerance to functional tasks.    Time  7    Period  Weeks    Status  New      PT LONG TERM GOAL #4   Title  Pt will report being able to stand for 70mins or > with 3/10 LBP or < to demo improved tolerance to functional tasks and maximize her ability to perform work and [employer] duties with less pain.    Time  7    Period  Weeks    Status  New            Plan - 12/24/18 0815    Clinical Impression Statement  Continued with established POC with focus on strengthening and stretching to reduce overall pain with functional mobility. Focused beginning of session on instrument assisted STM to L gluteal musculature with palpable tightness throughout glute and piriformis. Pt with slight improvement in pain with STM reporting pain centralized from foot to L knee. Followed up with strengthening of hip musculature with clams, reverse clams and bridging to provide muscle activation in non-weight-bearing position to relieve pain as much as possible. Pt's pain level continues to be high, so ended session early due to flare up. Updated HEP this date for pt to continue strengthening at home when pain is low. Pt would continue to benefit from skilled PT to further improve strength, ROM, reduce pain, improve gait mechanics and balance and improve overall QoL.    Personal Factors and Comorbidities  Age;Comorbidity 3+;Past/Current Experience    Comorbidities  see above    Examination-Activity Limitations  Lift;Stand;Sit    Examination-Participation Restrictions  Cleaning;Other   work duties   [employer]  Stable/Uncomplicated    Rehab Potential   Good    PT Frequency  1x / week    PT Duration  3 weeks    PT Treatment/Interventions  ADLs/Self Care Home Management;Aquatic Therapy;Cryotherapy;Electrical Stimulation;Moist Heat;Traction;Gait training;Stair training;Functional mobility training;Therapeutic activities;Therapeutic exercise;Balance training;Neuromuscular re-education;Cognitive remediation;Patient/family education;Manual techniques;Passive range of motion;Dry needling;Energy conservation;Taping;Spinal Manipulations;Joint Manipulations    PT Next Visit Plan  Stretching for lower back and hip mm, hip, core, functional strengthening. Challenge balance and progress standing exercises as able. STM and joint mobs for pain control PRN.    PT Home Exercise Plan  eval: SKTC, supine piriformis stretch, supine HS stretch/sciatic nerve glides; 12/17/18: LTR, piriformis figure 4 and bridges; 7/14: clams, reverse clams    Consulted and Agree with Plan of Care  Patient       Patient will benefit from skilled therapeutic intervention in order to improve the following deficits and impairments:  Decreased activity tolerance, Decreased balance, Decreased strength, Hypomobility, Increased fascial restricitons, Increased muscle spasms, Impaired flexibility, Improper body mechanics, Postural dysfunction, Pain, Decreased range of motion  Visit Diagnosis: 1. Chronic left-sided low back pain without sciatica   2. Muscle weakness (generalized)   3. Other symptoms and signs involving the  musculoskeletal system        Problem List Patient Active Problem List   Diagnosis Date Noted  . Lumbar degenerative disc disease 01/12/2017  . Essential hypertension 12/07/2016  . Asthma in adult, mild intermittent, uncomplicated 67/56/1254  . Special screening for malignant neoplasms, colon 01/07/2016  . Breast nodule 10/12/2015  . Low back pain 12/08/2013  . Fibroids 04/28/2013  . Hot flashes 03/26/2013  . Enlarged uterus 03/26/2013       Talbot Grumbling  PT, DPT  [place], [place], [address] Phone: [phone]   Fax:  [phone]  Name: MARVELOUS WOOLFORD MRN: [id number] Date of Birth: 1965/07/05

## 2018-12-24 NOTE — Telephone Encounter (Signed)
I cannot keep giving pain medicine every week.  She needs to get medicine from her primary care then.

## 2018-12-25 NOTE — Telephone Encounter (Signed)
Patient was notified.

## 2018-12-26 ENCOUNTER — Ambulatory Visit (HOSPITAL_COMMUNITY): Payer: Medicaid Other

## 2018-12-26 ENCOUNTER — Encounter (HOSPITAL_COMMUNITY): Payer: Self-pay

## 2018-12-26 ENCOUNTER — Other Ambulatory Visit: Payer: Self-pay

## 2018-12-26 DIAGNOSIS — M6281 Muscle weakness (generalized): Secondary | ICD-10-CM

## 2018-12-26 DIAGNOSIS — R29898 Other symptoms and signs involving the musculoskeletal system: Secondary | ICD-10-CM | POA: Diagnosis not present

## 2018-12-26 DIAGNOSIS — M545 Low back pain, unspecified: Secondary | ICD-10-CM

## 2018-12-26 DIAGNOSIS — G8929 Other chronic pain: Secondary | ICD-10-CM

## 2018-12-26 NOTE — Therapy (Signed)
[place], [place], [address] Phone: [phone]   Fax:  [phone]   Progress Note Reporting Period 12/09/18 to 12/26/18  See note below for Objective Data and Assessment of Progress/Goals.       Physical Therapy Treatment  Patient Details  Name: Judy Cross MRN: [id number] Date of Birth: 05/17/1966 Referring Provider (PT): [name], MD   Encounter Date: 12/26/2018  PT End of Session - 12/26/18 1105    Visit Number  4    Number of Visits  8    Date for PT Re-Evaluation  01/27/19   Minireassess on 12/30/18   Authorization Type  Medicaid - approved authorization 6/30-->12/30/18 1x/week for 3 weeks (made cert good to cover first 3 weeks of visits +4 additional weeks once able to requrest more visits from [facility])    Authorization Time Period  12/09/18 to 01/27/19    Authorization - Visit Number  3    Authorization - Number of Visits  8    PT Start Time  1109    PT Stop Time  1135    PT Time Calculation (min)  26 min    Activity Tolerance  Patient tolerated treatment well;Patient limited by pain    Behavior During Therapy  [facility] for tasks assessed/performed       Past Medical History:  Diagnosis Date  . Allergy   . Arthritis   . Asthma in adult, mild intermittent, uncomplicated 2/83/6629  . Breast nodule 10/12/2015  . 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 Endometrial polyp 04/28/2013  . Fibroids 04/28/2013  . Hot flashes 03/26/2013  . Hypertension   . Irregular bleeding 03/26/2013  . Neuromuscular disorder (HCC)    sciatica  . Seasonal allergies   . Seizures ([place])    last seizure was 2 years ago; unknown etiology. On Keppra.    Past Surgical History:  Procedure Laterality Date  . ABLATION     with polyp removal.  . CESAREAN SECTION     x 3  . COLONOSCOPY WITH PROPOFOL N/A 01/11/2016   Procedure: COLONOSCOPY WITH PROPOFOL;  Surgeon: Danie Binder, MD;  [facility];   Service: Endoscopy;  Laterality: N/A;  800  . EXAMINATION UNDER ANESTHESIA  02/28/2012   Procedure: EXAM UNDER ANESTHESIA;  Surgeon: Donato Heinz, MD;  Location: [facility];  Service: General;  Laterality: N/A;  . GANGLION CYST EXCISION Left   . HYSTEROSCOPY W/D&C N/A 05/23/2013   Procedure: DILATATION AND CURETTAGE /HYSTEROSCOPY;  Surgeon: Jonnie Kind, MD;  Location: [facility];  Service: Gynecology;  Laterality: N/A;  . POLYPECTOMY N/A 05/23/2013   Procedure: ENDOMETRIAL POLYP REMOVAL;  Surgeon: Jonnie Kind, MD;  Location: [facility];  Service: Gynecology;  Laterality: N/A;  . SPHINCTEROTOMY  02/28/2012   Procedure: SPHINCTEROTOMY;  Surgeon: Donato Heinz, MD;  Location: [facility];  Service: General;  Laterality: N/A;  Lateral Internal Sphincterotomy  . TRIGGER FINGER RELEASE Left    ring finger  . TUBAL LIGATION      There were no vitals filed for this visit.  Subjective Assessment - 12/26/18 1104    Subjective  Pt reports she is still having pain in back that goes across her L low back to her hip and down into her L foot. Pt reports she was able to do the new exercises on her HEP without difficulty.    Limitations  Standing;Sitting;Lifting    How long can you sit  comfortably?  10-15 mins    How long can you stand comfortably?  10-30mns    How long can you walk comfortably?  relieving factor    Patient Stated Goals  get rid of this pain    Pain Score  7     Pain Location  Back    Pain Orientation  Left;Lower    Pain Descriptors / Indicators  Sharp    Pain Type  Chronic pain    Pain Radiating Towards  across back to L hip    Pain Onset  More than a month ago    Pain Frequency  Intermittent    Aggravating Factors   sitting, standing, lifting    Pain Relieving Factors  walking    Effect of Pain on Daily Activities  increase         [facility] PT Assessment - 12/26/18 0001      Assessment   Medical Diagnosis  Chronic midline LBP    Referring Provider (PT)  WSanjuana Kava MD     Onset Date/Surgical Date  --   years ago   Next MD Visit  01/16/2019    Prior Therapy  yes for same issue about 2 years ago      Balance Screen   Has the patient fallen in the past 6 months  Yes    How many times?  none since starting therapy    Has the patient had a decrease in activity level because of a fear of falling?   No    Is the patient reluctant to leave their home because of a fear of falling?   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       Functional Tests   Functional tests  Sit to Stand      Sit to Stand   Comments  30 sec STS; 12 reps, no UE, from chair      Posture/Postural Control   Posture/Postural Control  Postural limitations    Postural Limitations  Rounded Shoulders;Forward head      ROM / Strength   AROM / PROM / Strength  AROM;Strength      AROM   AROM Assessment Site  Lumbar    Lumbar Flexion  to floor    Lumbar Extension  50% limited    Lumbar - Right Side Bend  mid calf - pulling on L    Lumbar - Left Side Bend  mid calf - no pain    Lumbar - Right Rotation  WFL    [facility]um[facility] - Left Rotation  [facility]     Strength   Strength Assessment Site  Hip;Knee;Ankle    Right Hip Flexion  4/5    Right Hip Extension  4+/5    Right Hip ABduction  4/5    Left Hip Flexion  4/5   painful   Left Hip Extension  4-/5    Left Hip ABduction  4/5    Right Knee Flexion  4+/5    Right Knee Extension  5/5    Left Knee Flexion  4+/5    Left Knee Extension  4+/5    Right Ankle Dorsiflexion  5/5    Left Ankle Dorsiflexion  5/5      Palpation  Spinal mobility  tenderness and recreation of pain with CPAs from T10-L5; difficult to assess mobility due to pain    Palpation comment  mod-max restrictions throughout L lumbar paraspinals and L glute med/max and piriformis -- all of  which recreated LLE pain to knee and it centralized with prolonged MFR to L glute med/piriformis      Slump test   Findings  Negative    Comment  bil      Balance   Balance Assessed  Yes      Static Standing Balance   Static Standing - Balance Support  No upper extremity supported    Static Standing Balance -  Activities   Single Leg Stance - Right Leg;Single Leg Stance - Left Leg    Static Standing - Comment/# of Minutes  R: 50 sec, L: 37 sec              PT Education - 12/26/18 1105    Education Details  Reassessment findings, continued POC, exercise technique, continue HEP    Person(s) Educated  Patient    Methods  Explanation    Comprehension  Verbalized understanding       PT Short Term Goals - 12/26/18 1107      PT SHORT TERM GOAL #1   Title  Pt will be independent with HEP and perform consistently in order to promote reduced pain.    Baseline  7/16: pt reports performing every other day    Time  3    Period  Weeks    Status  On-going    Target Date  12/30/18      PT SHORT TERM GOAL #2   Title  Pt will be able to perform bil SLS for 30 sec in order to demo improved core and functional hip strength in order to maximize her standing tolerance and work duties.    Baseline  7/16: R: 50 sec, L: 37 sec    Time  3    Period  Weeks    Status  Achieved      PT SHORT TERM GOAL #3   Title  Pt will be able to perform 15STS during 30sec chair rise test without pain to demo improved functional hip strength and improved tolerance to functional mobility.    Baseline  7/16: 12 reps    Time  3    Period  Weeks    Status  On-going        PT Long Term Goals - 12/26/18 1125      PT LONG TERM GOAL #1   Title  Pt will have improved MMT by 1/2 grade throughout all mm groups tested in order to reduce her overall LBP and maximize her tolerance to functional tasks.    Baseline  7/16: see MMT    Time  7    Period  Weeks    Status  On-going      PT LONG TERM GOAL #2    Title  Pt will have reduced soft tissue restrictions to moderate throughout lumbar and gluteal mm and without referred pain down LLE to demo improved overall mm restrictions and reduce her overall LBP.    Baseline  7/16: continues to have mod-mac restrictions with referred pain    Time  7    Period  Weeks    Status  On-going      PT LONG TERM GOAL #3   Title  Pt will report being able to sit for 30 mins  or > with 3/10 LBP or < to demo improved posture and improved tolerance to functional tasks.    Baseline  7/16: 10-15 pain with 8/10 pain    Time  7    Period  Weeks    Status  On-going      PT LONG TERM GOAL #4   Title  Pt will report being able to stand for 48mns or > with 3/10 LBP or < to demo improved tolerance to functional tasks and maximize her ability to perform work and [facility]uties with less pain.    Baseline  7/16: 10-15 pain with 8/10 pain    Time  7    Period  Weeks    Status  On-going            Plan - 12/26/18 1142    Clinical Impression Statement  Pt due for reassessment today of goals and other objective measures. Pt continues to have subjective complaints of high pain with prolonged sitting and standing and no improvement in tolerance to those activities since beginning therapy. Pt continues to have deficits in strength AEB MMT scores. Pt with some improvement in balance demonstrated by increased SLS scores and meeting that goal this date. Pt still limited with STS transfers, only able to perform 12 reps in 30 seconds due to increasing pain and fatigue with repeated reps. Pt with continued palpable muscle restrictions throughout lumbar paraspinals and L hip musculature including glute med, max and min and piriformis. Pt reports centralization of radiating pain complaints down LLE with prolonged holds to L glute med/piriformis. Pt with some improvement in tolerance to exercises and reports good HEP compliance at home. Pt would continue to benefit from skilled PT  interventions to further improve strength, AROM, gait mechanics, balance, reduce pain and improve overall ability to perform work related tasks and QoL.    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    Rehab Potential  Good    PT Frequency  2x / week    PT Duration  4 weeks    PT Treatment/Interventions  ADLs/Self Care Home Management;Aquatic Therapy;Cryotherapy;Electrical Stimulation;Moist Heat;Traction;Gait training;Stair training;Functional mobility training;Therapeutic activities;Therapeutic exercise;Balance training;Neuromuscular re-education;Cognitive remediation;Patient/family education;Manual techniques;Passive range of motion;Dry needling;Energy conservation;Taping;Spinal Manipulations;Joint Manipulations    PT Next Visit Plan  Stretching for lower back and hip mm; strengthening for BLE hips and core. Functional strengthening with work related tasks. Challenge balance and progress standing exercises as able. STM and joint mobs for pain control PRN.    PT Home Exercise Plan  eval: SKTC, supine piriformis stretch, supine HS stretch/sciatic nerve glides; 12/17/18: LTR, piriformis figure 4 and bridges; 7/14: clams, reverse clams    Consulted and Agree with Plan of Care  Patient       Patient will benefit from skilled therapeutic intervention in order to improve the following deficits and impairments:  Decreased activity tolerance, Decreased balance, Decreased strength, Hypomobility, Increased fascial restricitons, Increased muscle spasms, Impaired flexibility, Improper body mechanics, Postural dysfunction, Pain, Decreased range of motion  Visit Diagnosis: 1. Chronic left-sided low back pain without sciatica   2. Muscle weakness (generalized)   3.  Other symptoms and signs involving the musculoskeletal system        Problem List Patient Active Problem List   Diagnosis Date Noted  . Lumbar degenerative disc  disease 01/12/2017  . Essential hypertension 12/07/2016  . Asthma in adult, mild intermittent, uncomplicated 19/50/9326  . Special screening for malignant neoplasms, colon 01/07/2016  . Breast nodule 10/12/2015  . Low back pain 12/08/2013  . Fibroids 04/28/2013  . Hot flashes 03/26/2013  . Enlarged uterus 03/26/2013       Talbot Grumbling PT, DPT  [place], [place], [address] Phone: [phone]   Fax:  [phone]  Name: Judy Cross MRN: [id number] Date of Birth: 14-Mar-1966

## 2019-01-02 ENCOUNTER — Encounter (HOSPITAL_COMMUNITY): Payer: Self-pay

## 2019-01-02 ENCOUNTER — Other Ambulatory Visit: Payer: Self-pay

## 2019-01-02 ENCOUNTER — Ambulatory Visit (HOSPITAL_COMMUNITY): Payer: Medicaid Other

## 2019-01-02 DIAGNOSIS — M6281 Muscle weakness (generalized): Secondary | ICD-10-CM

## 2019-01-02 DIAGNOSIS — M545 Low back pain, unspecified: Secondary | ICD-10-CM

## 2019-01-02 DIAGNOSIS — R29898 Other symptoms and signs involving the musculoskeletal system: Secondary | ICD-10-CM | POA: Diagnosis not present

## 2019-01-02 DIAGNOSIS — G8929 Other chronic pain: Secondary | ICD-10-CM | POA: Diagnosis not present

## 2019-01-02 NOTE — Therapy (Signed)
[place], [place], [address] Phone: [phone]   Fax:  [phone]  Physical Therapy Treatment  Patient Details  Name: Judy Cross MRN: [id number] Date of Birth: August 13, 1965 Referring Provider (PT): [name], MD   Encounter Date: 01/02/2019  PT End of Session - 01/02/19 1045    Visit Number  5    Number of Visits  8    Date for PT Re-Evaluation  01/27/19   Minireassess on 12/30/18   Authorization Type  Medicaid - approved authorization 6/30-->12/30/18 1x/week for 3 weeks (made cert good to cover first 3 weeks of visits +4 additional weeks once able to requrest more visits from [facility])    Authorization Time Period  12/09/18 to 01/27/19    Authorization - Visit Number  4    Authorization - Number of Visits  8    PT Start Time  1030    PT Stop Time  1110    PT Time Calculation (min)  40 min    Activity Tolerance  Patient tolerated treatment well;Patient limited by pain    Behavior During Therapy  [facility] for tasks assessed/performed       Past Medical History:  Diagnosis Date  . Allergy   . Arthritis   . Asthma in adult, mild intermittent, uncomplicated 10/13/5463  . Breast nodule 10/12/2015  . 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 Endometrial polyp 04/28/2013  . Fibroids 04/28/2013  . Hot flashes 03/26/2013  . Hypertension   . Irregular bleeding 03/26/2013  . Neuromuscular disorder (HCC)    sciatica  . Seasonal allergies   . Seizures ([place])    last seizure was 2 years ago; unknown etiology. On Keppra.    Past Surgical History:  Procedure Laterality Date  . ABLATION     with polyp removal.  . CESAREAN SECTION     x 3  . COLONOSCOPY WITH PROPOFOL N/A 01/11/2016   Procedure: COLONOSCOPY WITH PROPOFOL;  Surgeon: Danie Binder, MD;  Location: [facility] ENDO [facility];  Service: Endoscopy;  Laterality: N/A;  800  . EXAMINATION UNDER ANESTHESIA  02/28/2012   Procedure: EXAM UNDER ANESTHESIA;  Surgeon: Donato Heinz, MD;  Location: [facility] ORS;  Service: General;  Laterality: N/A;  . GANGLION CYST EXCISION Left   . HYSTEROSCOPY W/D&C N/A 05/23/2013   Procedure: DILATATION AND CURETTAGE /HYSTEROSCOPY;  Surgeon: Jonnie Kind, MD;  Location: [facility] ORS;  Service: Gynecology;  Laterality: N/A;  . POLYPECTOMY N/A 05/23/2013   Procedure: ENDOMETRIAL POLYP REMOVAL;  Surgeon: Jonnie Kind, MD;  Location: [facility] ORS;  Service: Gynecology;  Laterality: N/A;  . SPHINCTEROTOMY  02/28/2012   Procedure: SPHINCTEROTOMY;  Surgeon: Donato Heinz, MD;  Location: [facility] ORS;  Service: General;  Laterality: N/A;  Lateral Internal Sphincterotomy  . TRIGGER FINGER RELEASE Left    ring finger  . TUBAL LIGATION      There were no vitals filed for this visit.  Subjective Assessment - 01/02/19 1032    Subjective  Pt reports back is 8/10 today and the pain has been constant for 3 days. Pt reports no new activities or accidents and reports she has still been able to work, just has pain. Pt reports she goes back to doctor in August.    Limitations  Standing;Sitting;Lifting    How long can you sit comfortably?  10-15 mins    How long can you stand comfortably?  10-29mns    How long can you  walk comfortably?  relieving factor    Patient Stated Goals  get rid of this pain    Currently in Pain?  Yes    Pain Score  8     Pain Location  Hip    Pain Orientation  Left    Pain Descriptors / Indicators  Nagging    Pain Type  Chronic pain    Pain Onset  More than a month ago    Pain Frequency  Intermittent    Aggravating Factors   sitting, standing, lifting    Pain Relieving Factors  walking    Effect of Pain on Daily Activities  increase          [facility] Adult PT Treatment/Exercise - 01/02/19 0001      Lumbar Exercises: Stretches   Single Knee to Chest Stretch  3 reps;30 seconds    Single Knee to Chest Stretch Limitations  BLE    Figure 4 Stretch  3 reps;30 seconds    Figure 4 Stretch Limitations  BLE    Other Lumbar Stretch  Exercise  supine HS stretch/sciatic nerve glides, x5 reps    Other Lumbar Stretch Exercise  Ql stretch in doorway, x30 sec both sides      Lumbar Exercises: Standing   Other Standing Lumbar Exercises  Fwd step ups, 4" box, x15 reps BLE; Lateral step ups, 4" box, x15 reps BLE;  Step downs, 4" box, x15 reps BLE    Other Standing Lumbar Exercises  Heel taps from 4" box, x15 reps BLE      Lumbar Exercises: Seated   Sit to Stand  10 reps      Lumbar Exercises: Supine   Bridge  15 reps    Bridge Limitations  2 sets      Lumbar Exercises: Sidelying   Clam  Both;10 reps    Clam Limitations  2 sets, RTB      Manual Therapy   Manual Therapy  Soft tissue mobilization    Manual therapy comments  Manual complete separate than rest of tx    Soft tissue mobilization  instrument assisted STM using green ball to L glute and piriformis musculature to relieve pain             PT Education - 01/02/19 1044    Education Details  Exercise technique, updated HEP, self instrument assisted STM using tennis ball at home for pain relief and reduce muscle spasm    Person(s) Educated  Patient    Methods  Explanation;Demonstration;Handout    Comprehension  Verbalized understanding;Returned demonstration       PT Short Term Goals - 12/26/18 1107      PT SHORT TERM GOAL #1   Title  Pt will be independent with HEP and perform consistently in order to promote reduced pain.    Baseline  7/16: pt reports performing every other day    Time  3    Period  Weeks    Status  On-going    Target Date  12/30/18      PT SHORT TERM GOAL #2   Title  Pt will be able to perform bil SLS for 30 sec in order to demo improved core and functional hip strength in order to maximize her standing tolerance and work duties.    Baseline  7/16: R: 50 sec, L: 37 sec    Time  3    Period  Weeks    Status  Achieved      PT SHORT  TERM GOAL #3   Title  Pt will be able to perform 15STS during 30sec chair rise test without pain  to demo improved functional hip strength and improved tolerance to functional mobility.    Baseline  7/16: 12 reps    Time  3    Period  Weeks    Status  On-going        PT Long Term Goals - 12/26/18 1125      PT LONG TERM GOAL #1   Title  Pt will have improved MMT by 1/2 grade throughout all mm groups tested in order to reduce her overall LBP and maximize her tolerance to functional tasks.    Baseline  7/16: see MMT    Time  7    Period  Weeks    Status  On-going      PT LONG TERM GOAL #2   Title  Pt will have reduced soft tissue restrictions to moderate throughout lumbar and gluteal mm and without referred pain down LLE to demo improved overall mm restrictions and reduce her overall LBP.    Baseline  7/16: continues to have mod-mac restrictions with referred pain    Time  7    Period  Weeks    Status  On-going      PT LONG TERM GOAL #3   Title  Pt will report being able to sit for 30 mins or > with 3/10 LBP or < to demo improved posture and improved tolerance to functional tasks.    Baseline  7/16: 10-15 pain with 8/10 pain    Time  7    Period  Weeks    Status  On-going      PT LONG TERM GOAL #4   Title  Pt will report being able to stand for 25mns or > with 3/10 LBP or < to demo improved tolerance to functional tasks and maximize her ability to perform work and [facility]uties with less pain.    Baseline  7/16: 10-15 pain with 8/10 pain    Time  7    Period  Weeks    Status  On-going            Plan - 01/02/19 1045    Clinical Impression Statement  Continued with pt's established POC this date with focus on pain relief and strengthening BLE. Initiated treatment session with instrument assisted STM to L gluteal and piriformis musculature for pain relief. Pt reports 2/10 pain after STM and able to tolerate stretches and exercises. Pt continues with stretching throughout BLE to maintain tissue extensibility and reduce overall pain. Added QL stretch in doorway this date to  involve side body musculature and reduce muscle spasms and pain throughout that region. Progressed with standing exercises this date with pt performing step ups forward and lateral and step downs from 4" box to engage quads and glutes. Added heel taps from 4" box for added quad specific contraction to improve L LE stability and strength throughout gait cycle. Pt reports 0/10 pain at EOS. Continue to progress as able.    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    Rehab Potential  Good    PT Frequency  2x / week    PT Duration  4 weeks    PT Treatment/Interventions  ADLs/Self  Care Home Management;Aquatic Therapy;Cryotherapy;Electrical Stimulation;Moist Heat;Traction;Gait training;Stair training;Functional mobility training;Therapeutic activities;Therapeutic exercise;Balance training;Neuromuscular re-education;Cognitive remediation;Patient/family education;Manual techniques;Passive range of motion;Dry needling;Energy conservation;Taping;Spinal Manipulations;Joint Manipulations    PT Next Visit Plan  Continue stretching throughout BLE hips and low back. Progress strengthening for BLE, posture, and core. Add functional strengthening with work related tasks and begin to challenge balance when able. STM and joint mobs for pain control PRN.    PT Home Exercise Plan  eval: SKTC, supine piriformis stretch, supine HS stretch/sciatic nerve glides; 12/17/18: LTR, piriformis figure 4 and bridges; 7/14: clams, reverse clams; 7/23: STS, QL doorway stretch, self instrument assisted STM using tennis ball at home    Consulted and Agree with Plan of Care  Patient       Patient will benefit from skilled therapeutic intervention in order to improve the following deficits and  impairments:  Decreased activity tolerance, Decreased balance, Decreased strength, Hypomobility, Increased fascial restricitons, Increased muscle spasms, Impaired flexibility, Improper body mechanics, Postural dysfunction, Pain, Decreased range of motion  Visit Diagnosis: 1. Chronic left-sided low back pain without sciatica   2. Muscle weakness (generalized)   3. Other symptoms and signs involving the musculoskeletal system        Problem List Patient Active Problem List   Diagnosis Date Noted  . Lumbar degenerative disc disease 01/12/2017  . Essential hypertension 12/07/2016  . Asthma in adult, mild intermittent, uncomplicated 25/63/8937  . Special screening for malignant neoplasms, colon 01/07/2016  . Breast nodule 10/12/2015  . Low back pain 12/08/2013  . Fibroids 04/28/2013  . Hot flashes 03/26/2013  . Enlarged uterus 03/26/2013      Talbot Grumbling PT, DPT  [place], [place], [address] Phone: [phone]   Fax:  [phone]  Name: VELVA MOLINARI MRN: [id number] Date of Birth: [birth year]

## 2019-01-06 ENCOUNTER — Telehealth (HOSPITAL_COMMUNITY): Payer: Self-pay

## 2019-01-06 ENCOUNTER — Ambulatory Visit (HOSPITAL_COMMUNITY): Payer: Medicaid Other

## 2019-01-06 NOTE — Telephone Encounter (Signed)
She missed her apptment this moring she over slept due to talking pain meds R/s for tomorro

## 2019-01-06 NOTE — Telephone Encounter (Signed)
No Show #1. This therapist called pt, pt reports she over slept due to taking medication for her knee pain and that her knee is swollen this morning. Educated pt on future appointment Friday (7/31) at 9:30 and pt requested to come in later today if available.Pt very apologetic for missing appointment, reassured pt it is ok and we will see her Friday or sooner if available. Transferred pt to front office staff to get rescheduled.   Talbot Grumbling PT, DPT

## 2019-01-07 ENCOUNTER — Telehealth (HOSPITAL_COMMUNITY): Payer: Self-pay | Admitting: Physical Therapy

## 2019-01-07 ENCOUNTER — Ambulatory Visit (HOSPITAL_COMMUNITY): Payer: Medicaid Other | Admitting: Physical Therapy

## 2019-01-07 NOTE — Telephone Encounter (Signed)
No-show #2. Called and left message regarding this no-show as well as time/date of next appointment. Also educated regarding no-show policy and that due to having 2 consecutive no-shows she can only schedule one appointment at a time at this point.   Deniece Ree PT, DPT, CBIS  Supplemental Physical Therapist [facility]    Pager [phone] Acute Rehab Office [phone]

## 2019-01-10 ENCOUNTER — Encounter (HOSPITAL_COMMUNITY): Payer: Self-pay | Admitting: Physical Therapy

## 2019-01-10 ENCOUNTER — Ambulatory Visit (HOSPITAL_COMMUNITY): Payer: Medicaid Other | Admitting: Physical Therapy

## 2019-01-10 ENCOUNTER — Other Ambulatory Visit: Payer: Self-pay

## 2019-01-10 DIAGNOSIS — M545 Low back pain, unspecified: Secondary | ICD-10-CM

## 2019-01-10 DIAGNOSIS — M6281 Muscle weakness (generalized): Secondary | ICD-10-CM

## 2019-01-10 DIAGNOSIS — R29898 Other symptoms and signs involving the musculoskeletal system: Secondary | ICD-10-CM | POA: Diagnosis not present

## 2019-01-10 DIAGNOSIS — G8929 Other chronic pain: Secondary | ICD-10-CM

## 2019-01-10 NOTE — Therapy (Signed)
[place], [place], [address] Phone: [phone]   Fax:  [phone]  Physical Therapy Treatment  Patient Details  Name: Judy Cross MRN: [id number] Date of Birth: [birth year] Referring Provider (PT): [name], MD   Encounter Date: 01/10/2019  PT End of Session - 01/10/19 1310    Visit Number  6    Number of Visits  8    Date for PT Re-Evaluation  01/27/19   Minireassess on 12/30/18   Authorization Type  Medicaid - approved authorization 6/30-->12/30/18 1x/week for 3 weeks (made cert good to cover first 3 weeks of visits +4 additional weeks once able to requrest more visits from [facility])    Authorization Time Period  12/09/18 to 01/27/19    Authorization - Visit Number  5    Authorization - Number of Visits  8    PT Start Time  0930    PT Stop Time  1020    PT Time Calculation (min)  50 min    Activity Tolerance  Patient tolerated treatment well;Patient limited by pain    Behavior During Therapy  [facility] for tasks assessed/performed       Past Medical History:  Diagnosis Date  . Allergy   . Arthritis   . Asthma in adult, mild intermittent, uncomplicated 3/87/5643  . Breast nodule 10/12/2015  . 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 Endometrial polyp 04/28/2013  . Fibroids 04/28/2013  . Hot flashes 03/26/2013  . Hypertension   . Irregular bleeding 03/26/2013  . Neuromuscular disorder (HCC)    sciatica  . Seasonal allergies   . Seizures ([place])    last seizure was 2 years ago; unknown etiology. On Keppra.    Past Surgical History:  Procedure Laterality Date  . ABLATION     with polyp removal.  . CESAREAN SECTION     x 3  . COLONOSCOPY WITH PROPOFOL N/A 01/11/2016   Procedure: COLONOSCOPY WITH PROPOFOL;  Surgeon: Danie Binder, MD;  Location: [facility];  Service: Endoscopy;  Laterality: N/A;  800  . EXAMINATION UNDER ANESTHESIA  02/28/2012   Procedure: EXAM UNDER ANESTHESIA;  Surgeon: Donato Heinz, MD;  Location: [facility] ORS;  Service: General;  Laterality: N/A;  . GANGLION CYST EXCISION Left   . HYSTEROSCOPY W/D&C N/A 05/23/2013   Procedure: DILATATION AND CURETTAGE /HYSTEROSCOPY;  Surgeon: Jonnie Kind, MD;  Location: [facility] ORS;  Service: Gynecology;  Laterality: N/A;  . POLYPECTOMY N/A 05/23/2013   Procedure: ENDOMETRIAL POLYP REMOVAL;  Surgeon: Jonnie Kind, MD;  Location: [facility] ORS;  Service: Gynecology;  Laterality: N/A;  . SPHINCTEROTOMY  02/28/2012   Procedure: SPHINCTEROTOMY;  Surgeon: Donato Heinz, MD;  Location: [facility] ORS;  Service: General;  Laterality: N/A;  Lateral Internal Sphincterotomy  . TRIGGER FINGER RELEASE Left    ring finger  . TUBAL LIGATION      There were no vitals filed for this visit.  Subjective Assessment - 01/10/19 0928    Subjective  Patient reported that her left side is bothering her today and said it is aggravating. Reported that her blood pressure was elevated yesterday, but it was normal when she checked it this morning.    Limitations  Standing;Sitting;Lifting    How long can you sit comfortably?  10-15 mins    How long can you stand comfortably?  10-48mns    How long can you walk comfortably?  relieving factor    Patient Stated  Goals  get rid of this pain    Currently in Pain?  Yes    Pain Score  10-Worst pain ever    Pain Location  Other (Comment)   Left entire leg   Pain Orientation  Left    Pain Onset  More than a month ago                       [facility] Adult PT Treatment/Exercise - 01/10/19 0001      Lumbar Exercises: Stretches   Figure 4 Stretch  3 reps;30 seconds    Figure 4 Stretch Limitations  BLE    Other Lumbar Stretch Exercise  supine HS stretch/sciatic nerve glides, x5 reps    Other Lumbar Stretch Exercise  QL stretch in doorway, x30 sec both sides      Lumbar Exercises: Standing   Other Standing Lumbar Exercises  Fwd step ups, 4" box, x15 reps BLE; Lateral step ups, 4" box, x15 reps BLE;  Step downs, 4"  box, x15 reps BLE    Other Standing Lumbar Exercises  Tandem on foam 2x30'' each LE forward. Heel taps from 4" box, x15 reps BLE. Mirror for visual cues.       Lumbar Exercises: Supine   Bridge  15 reps    Bridge Limitations  2 sets      Lumbar Exercises: Sidelying   Clam  Both;10 reps    Clam Limitations  2 sets, RTB      Lumbar Exercises: Prone   Other Prone Lumbar Exercises  Prone on elbows 20 second holds x 10      Manual Therapy   Manual Therapy  Soft tissue mobilization    Manual therapy comments  All manual therapy completed separately than other skilled interventions    Soft tissue mobilization  Patient prone with soft bolster under ankles for comfort. Soft tissue mobilization to patient's left erector spinae from thoracic region through lumbar with noted muscular restrictions. Instrument assisted STM using red ball to left glute and piriformis musculature to relieve pain.             PT Education - 01/10/19 1309    Education Details  Discussed the impact of sitting and standing on patient's spine related to her pain. Explained why extension exercises may improve her pain since sitting increases her pain and walking decreases her pain.    Person(s) Educated  Patient    Methods  Explanation;Other (comment)   Spine model   Comprehension  Verbalized understanding       PT Short Term Goals - 12/26/18 1107      PT SHORT TERM GOAL #1   Title  Pt will be independent with HEP and perform consistently in order to promote reduced pain.    Baseline  7/16: pt reports performing every other day    Time  3    Period  Weeks    Status  On-going    Target Date  12/30/18      PT SHORT TERM GOAL #2   Title  Pt will be able to perform bil SLS for 30 sec in order to demo improved core and functional hip strength in order to maximize her standing tolerance and work duties.    Baseline  7/16: R: 50 sec, L: 37 sec    Time  3    Period  Weeks    Status  Achieved      PT SHORT TERM  GOAL #3  Title  Pt will be able to perform 15STS during 30sec chair rise test without pain to demo improved functional hip strength and improved tolerance to functional mobility.    Baseline  7/16: 12 reps    Time  3    Period  Weeks    Status  On-going        PT Long Term Goals - 12/26/18 1125      PT LONG TERM GOAL #1   Title  Pt will have improved MMT by 1/2 grade throughout all mm groups tested in order to reduce her overall LBP and maximize her tolerance to functional tasks.    Baseline  7/16: see MMT    Time  7    Period  Weeks    Status  On-going      PT LONG TERM GOAL #2   Title  Pt will have reduced soft tissue restrictions to moderate throughout lumbar and gluteal mm and without referred pain down LLE to demo improved overall mm restrictions and reduce her overall LBP.    Baseline  7/16: continues to have mod-mac restrictions with referred pain    Time  7    Period  Weeks    Status  On-going      PT LONG TERM GOAL #3   Title  Pt will report being able to sit for 30 mins or > with 3/10 LBP or < to demo improved posture and improved tolerance to functional tasks.    Baseline  7/16: 10-15 pain with 8/10 pain    Time  7    Period  Weeks    Status  On-going      PT LONG TERM GOAL #4   Title  Pt will report being able to stand for 57mns or > with 3/10 LBP or < to demo improved tolerance to functional tasks and maximize her ability to perform work and [facility] with less pain.    Baseline  7/16: 10-15 pain with 8/10 pain    Time  7    Period  Weeks    Status  On-going            Plan - 01/10/19 1324    Clinical Impression Statement  Began with stretching, followed by educating patient about the effect of standing or sitting on the spine using the spine model for demonstration. Educated patient on prone on elbows stretch for pain relief. Continued with strengthening exercises and used the mirror as a visual for the patient to perform heel taps. Ended session with  soft tissue mobilization for pain relief and to decrease muscular restrictions. This session focused on the left side of patient's mid to lower back and left hip. Also, progressed to tandem stance on foam to challenge balance. Performed soft tissue mobilization to the erector spinae muscles on the left side noting muscular restrictions throughout this area. Also included soft tissue mobilization to the glutes and piriformis using a weighted ball. Patient reported that she had less pain following the soft tissue mobilization.    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    Rehab Potential  Good    PT Frequency  2x / week    PT Duration  4 weeks    PT Treatment/Interventions  ADLs/Self Care Home Management;Aquatic Therapy;Cryotherapy;Electrical Stimulation;Moist Heat;Traction;Gait  training;Stair training;Functional mobility training;Therapeutic activities;Therapeutic exercise;Balance training;Neuromuscular re-education;Cognitive remediation;Patient/family education;Manual techniques;Passive range of motion;Dry needling;Energy conservation;Taping;Spinal Manipulations;Joint Manipulations    PT Next Visit Plan  Continue stretching throughout BLE hips and low back. Progress strengthening for BLE, posture, and core. Add functional strengthening with work related tasks and continue to challenge balance when able. STM and joint mobs for pain control PRN.    PT Home Exercise Plan  eval: SKTC, supine piriformis stretch, supine HS stretch/sciatic nerve glides; 12/17/18: LTR, piriformis figure 4 and bridges; 7/14: clams, reverse clams; 7/23: STS, QL doorway stretch, self instrument assisted STM using tennis ball at home    Consulted and Agree with Plan of Care  Patient        Patient will benefit from skilled therapeutic intervention in order to improve the following deficits and impairments:  Decreased activity tolerance, Decreased balance, Decreased strength, Hypomobility, Increased fascial restricitons, Increased muscle spasms, Impaired flexibility, Improper body mechanics, Postural dysfunction, Pain, Decreased range of motion  Visit Diagnosis: 1. Chronic left-sided low back pain without sciatica   2. Muscle weakness (generalized)   3. Other symptoms and signs involving the musculoskeletal system        Problem List Patient Active Problem List   Diagnosis Date Noted  . Lumbar degenerative disc disease 01/12/2017  . Essential hypertension 12/07/2016  . Asthma in adult, mild intermittent, uncomplicated 88/28/0034  . Special screening for malignant neoplasms, colon 01/07/2016  . Breast nodule 10/12/2015  . Low back pain 12/08/2013  . Fibroids 04/28/2013  . Hot flashes 03/26/2013  . Enlarged uterus 03/26/2013   Clarene Critchley PT, DPT 1:30 PM, 01/10/19 [place], [place], [address] Phone: [phone]   Fax:  [phone]  Name: Judy Cross MRN: [id number] Date of Birth: 11/07/1965

## 2019-01-13 ENCOUNTER — Ambulatory Visit (HOSPITAL_COMMUNITY): Payer: Medicaid Other | Admitting: Physical Therapy

## 2019-01-13 ENCOUNTER — Telehealth (HOSPITAL_COMMUNITY): Payer: Self-pay | Admitting: General Practice

## 2019-01-13 NOTE — Telephone Encounter (Signed)
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

## 2019-01-13 NOTE — Telephone Encounter (Signed)
01/13/19  Pt left a message to cx today's appt and wanted a call back to let her know we got her message.  I did call back to let her know that she still could schedule 2 more appts when she was ready to do that

## 2019-01-16 ENCOUNTER — Other Ambulatory Visit: Payer: Self-pay

## 2019-01-16 ENCOUNTER — Ambulatory Visit: Payer: Medicaid Other | Admitting: Orthopaedic Surgery

## 2019-01-16 ENCOUNTER — Encounter: Payer: Self-pay | Admitting: Orthopaedic Surgery

## 2019-01-16 VITALS — BP 159/107 | HR 78 | Temp 97.6°F | Ht 61.0 in | Wt 123.2 lb

## 2019-01-16 DIAGNOSIS — F17210 Nicotine dependence, cigarettes, uncomplicated: Secondary | ICD-10-CM

## 2019-01-16 DIAGNOSIS — M25562 Pain in left knee: Secondary | ICD-10-CM

## 2019-01-16 NOTE — Progress Notes (Signed)
CC:  I have pain of my left knee. I would like an injection.  The patient has chronic pain of the left knee.  There is no recent trauma.  There is no redness.  Injections in the past have helped.  The knee has no redness, has an effusion and crepitus present.  ROM of the left knee is 0-110.  Impression:  Chronic knee pain left  Return: 1 month  PROCEDURE NOTE:  The patient requests injections of the left knee, verbal consent was obtained.  The left knee was prepped appropriately after time out was performed.   Sterile technique was observed and injection of 1 cc of Depo-Medrol 40 mg with several cc's of plain xylocaine. Anesthesia was provided by ethyl chloride and a 20-gauge needle was used to inject the knee area. The injection was tolerated well.  A band aid dressing was applied.  The patient was advised to apply ice later today and tomorrow to the injection sight as needed.  Return in six weeks.  Electronically Signed [name], MD 8/6/20208:26 AM

## 2019-01-16 NOTE — Patient Instructions (Signed)

## 2019-01-21 ENCOUNTER — Telehealth: Payer: Self-pay | Admitting: Orthopaedic Surgery

## 2019-01-21 MED ORDER — HYDROCODONE-ACETAMINOPHEN 5-325 MG PO TABS: ORAL_TABLET | ORAL | 0 refills | Status: DC

## 2019-01-21 NOTE — Telephone Encounter (Signed)
Patient requests refill on Hydrocodone/Acetaminophen 5-325  Mgs.  Qty  12  Sig: One tablet every six hours as needed for pain. Must last 28 days.  Patient uses [facility]

## 2019-02-13 ENCOUNTER — Other Ambulatory Visit: Payer: Self-pay

## 2019-02-13 ENCOUNTER — Encounter: Payer: Self-pay | Admitting: Orthopaedic Surgery

## 2019-02-13 ENCOUNTER — Ambulatory Visit (INDEPENDENT_AMBULATORY_CARE_PROVIDER_SITE_OTHER): Payer: Medicaid Other | Admitting: Orthopaedic Surgery

## 2019-02-13 DIAGNOSIS — G8929 Other chronic pain: Secondary | ICD-10-CM | POA: Diagnosis not present

## 2019-02-13 DIAGNOSIS — F17210 Nicotine dependence, cigarettes, uncomplicated: Secondary | ICD-10-CM

## 2019-02-13 DIAGNOSIS — M25562 Pain in left knee: Secondary | ICD-10-CM | POA: Diagnosis not present

## 2019-02-13 MED ORDER — HYDROCODONE-ACETAMINOPHEN 5-325 MG PO TABS: ORAL_TABLET | ORAL | 0 refills | Status: DC

## 2019-02-13 NOTE — Progress Notes (Signed)
PROCEDURE NOTE:  The patient requests injections of the left knee , verbal consent was obtained.  The left knee was prepped appropriately after time out was performed.   Sterile technique was observed and injection of 1 cc of Depo-Medrol 40 mg with several cc's of plain xylocaine. Anesthesia was provided by ethyl chloride and a 20-gauge needle was used to inject the knee area. The injection was tolerated well.  A band aid dressing was applied.  The patient was advised to apply ice later today and tomorrow to the injection sight as needed.  Return in six weeks.  I have reviewed the [place] web site prior to prescribing narcotic medicine for this patient.   Electronically Signed [name], MD 9/3/20208:14 AM

## 2019-03-11 ENCOUNTER — Telehealth: Payer: Self-pay | Admitting: Orthopaedic Surgery

## 2019-03-11 NOTE — Telephone Encounter (Signed)
Patient called to relay that her left knee is hurting, said having increased pain down her leg, to her foot. Asked if an immediate appointment is available. Relayed that Dr Luna Glasgow is out of clinic this week, and that Dr Aline Brochure is in surgery; therefore, no providers in office. Patient asked about when her pain medication is due; relayed.  Confirmed patient's next scheduled appointment with Dr Luna Glasgow. I asked status of patient finding a primary care provider, in event  PCP can possibly see her; states still has no primary care. Discussed importance of establishing care with primary care office, as Medicaid/social services has brought to her attention also. States may go on to urgent care.

## 2019-03-13 ENCOUNTER — Telehealth: Payer: Self-pay | Admitting: Orthopaedic Surgery

## 2019-03-13 ENCOUNTER — Other Ambulatory Visit: Payer: Self-pay | Admitting: Orthopedic Surgery

## 2019-03-13 MED ORDER — HYDROCODONE-ACETAMINOPHEN 5-325 MG PO TABS: ORAL_TABLET | ORAL | 0 refills | Status: DC

## 2019-03-13 NOTE — Telephone Encounter (Signed)
Patient of Dr. Brooke Bonito requests refill on Hydrocodone/Acetaminophen 5-325 mgs.  Qty  69  Sig: One tablet every six hours as needed for pain. Must last 28 days.  Patient uses [facility] in [place]

## 2019-03-14 ENCOUNTER — Telehealth: Payer: Self-pay | Admitting: Obstetrics and Gynecology

## 2019-03-14 NOTE — Telephone Encounter (Signed)

## 2019-03-17 ENCOUNTER — Other Ambulatory Visit: Payer: Medicaid Other | Admitting: Obstetrics and Gynecology

## 2019-03-27 ENCOUNTER — Encounter: Payer: Self-pay | Admitting: Orthopaedic Surgery

## 2019-03-27 ENCOUNTER — Other Ambulatory Visit: Payer: Self-pay

## 2019-03-27 ENCOUNTER — Ambulatory Visit (INDEPENDENT_AMBULATORY_CARE_PROVIDER_SITE_OTHER): Payer: Medicaid Other | Admitting: Orthopaedic Surgery

## 2019-03-27 DIAGNOSIS — F17210 Nicotine dependence, cigarettes, uncomplicated: Secondary | ICD-10-CM

## 2019-03-27 DIAGNOSIS — G8929 Other chronic pain: Secondary | ICD-10-CM

## 2019-03-27 DIAGNOSIS — M25562 Pain in left knee: Secondary | ICD-10-CM | POA: Diagnosis not present

## 2019-03-27 NOTE — Patient Instructions (Signed)
Steps to Quit Smoking Smoking tobacco is the leading cause of preventable death. It can affect almost every organ in the body. Smoking puts you and people around you at risk for many serious, long-lasting (chronic) diseases. Quitting smoking can be hard, but it is one of the best things that you can do for your health. It is never too late to quit. How do I get ready to quit? When you decide to quit smoking, make a plan to help you succeed. Before you quit:  Pick a date to quit. Set a date within the next 2 weeks to give you time to prepare.  Write down the reasons why you are quitting. Keep this list in places where you will see it often.  Tell your family, friends, and co-workers that you are quitting. Their support is important.  Talk with your doctor about the choices that may help you quit.  Find out if your health insurance will pay for these treatments.  Know the people, places, things, and activities that make you want to smoke (triggers). Avoid them. What first steps can I take to quit smoking?  Throw away all cigarettes at home, at work, and in your car.  Throw away the things that you use when you smoke, such as ashtrays and lighters.  Clean your car. Make sure to empty the ashtray.  Clean your home, including curtains and carpets. What can I do to help me quit smoking? Talk with your doctor about taking medicines and seeing a counselor at the same time. You are more likely to succeed when you do both.  If you are pregnant or breastfeeding, talk with your doctor about counseling or other ways to quit smoking. Do not take medicine to help you quit smoking unless your doctor tells you to do so. To quit smoking: Quit right away  Quit smoking totally, instead of slowly cutting back on how much you smoke over a period of time.  Go to counseling. You are more likely to quit if you go to counseling sessions regularly. Take medicine You may take medicines to help you quit. Some  medicines need a prescription, and some you can buy over-the-counter. Some medicines may contain a drug called nicotine to replace the nicotine in cigarettes. Medicines may:  Help you to stop having the desire to smoke (cravings).  Help to stop the problems that come when you stop smoking (withdrawal symptoms). Your doctor may ask you to use:  Nicotine patches, gum, or lozenges.  Nicotine inhalers or sprays.  Non-nicotine medicine that is taken by mouth. Find resources Find resources and other ways to help you quit smoking and remain smoke-free after you quit. These resources are most helpful when you use them often. They include:  Online chats with a counselor.  Phone quitlines.  Printed self-help materials.  Support groups or group counseling.  Text messaging programs.  Mobile phone apps. Use apps on your mobile phone or tablet that can help you stick to your quit plan. There are many free apps for mobile phones and tablets as well as websites. Examples include Quit Guide from the CDC and smokefree.gov  What things can I do to make it easier to quit?   Talk to your family and friends. Ask them to support and encourage you.  Call a phone quitline (1-800-QUIT-NOW), reach out to support groups, or work with a counselor.  Ask people who smoke to not smoke around you.  Avoid places that make you want to smoke,   such as: ? Bars. ? Parties. ? Smoke-break areas at work.  Spend time with people who do not smoke.  Lower the stress in your life. Stress can make you want to smoke. Try these things to help your stress: ? Getting regular exercise. ? Doing deep-breathing exercises. ? Doing yoga. ? Meditating. ? Doing a body scan. To do this, close your eyes, focus on one area of your body at a time from head to toe. Notice which parts of your body are tense. Try to relax the muscles in those areas. How will I feel when I quit smoking? Day 1 to 3 weeks Within the first 24 hours,  you may start to have some problems that come from quitting tobacco. These problems are very bad 2-3 days after you quit, but they do not often last for more than 2-3 weeks. You may get these symptoms:  Mood swings.  Feeling restless, nervous, angry, or annoyed.  Trouble concentrating.  Dizziness.  Strong desire for high-sugar foods and nicotine.  Weight gain.  Trouble pooping (constipation).  Feeling like you may vomit (nausea).  Coughing or a sore throat.  Changes in how the medicines that you take for other issues work in your body.  Depression.  Trouble sleeping (insomnia). Week 3 and afterward After the first 2-3 weeks of quitting, you may start to notice more positive results, such as:  Better sense of smell and taste.  Less coughing and sore throat.  Slower heart rate.  Lower blood pressure.  Clearer skin.  Better breathing.  Fewer sick days. Quitting smoking can be hard. Do not give up if you fail the first time. Some people need to try a few times before they succeed. Do your best to stick to your quit plan, and talk with your doctor if you have any questions or concerns. Summary  Smoking tobacco is the leading cause of preventable death. Quitting smoking can be hard, but it is one of the best things that you can do for your health.  When you decide to quit smoking, make a plan to help you succeed.  Quit smoking right away, not slowly over a period of time.  When you start quitting, seek help from your doctor, family, or friends. This information is not intended to replace advice given to you by your health care provider. Make sure you discuss any questions you have with your health care provider. Document Released: 03/25/2009 Document Revised: 08/16/2018 Document Reviewed: 08/17/2018 Elsevier Patient Education  2020 [employer] Inc.  

## 2019-03-27 NOTE — Progress Notes (Signed)
CC:  I have pain of my left knee. I would like an injection.  The patient has chronic pain of the left knee.  There is no recent trauma.  There is no redness.  Injections in the past have helped.  The knee has no redness, has an effusion and crepitus present.  ROM of the left knee is 0-105.  Impression:  Chronic knee pain left  Return: 6 weeks  PROCEDURE NOTE:  The patient requests injections of the left knee, verbal consent was obtained.  The left knee was prepped appropriately after time out was performed.   Sterile technique was observed and injection of 1 cc of Depo-Medrol 40 mg with several cc's of plain xylocaine. Anesthesia was provided by ethyl chloride and a 20-gauge needle was used to inject the knee area. The injection was tolerated well.  A band aid dressing was applied.  The patient was advised to apply ice later today and tomorrow to the injection sight as needed.  Electronically Signed [name], MD 10/15/20208:10 AM

## 2019-04-16 ENCOUNTER — Telehealth: Payer: Self-pay | Admitting: Orthopaedic Surgery

## 2019-04-16 MED ORDER — HYDROCODONE-ACETAMINOPHEN 5-325 MG PO TABS: ORAL_TABLET | ORAL | 0 refills | Status: DC

## 2019-04-16 NOTE — Telephone Encounter (Signed)
Patient requests refill on Hydrocodone/Acetaminophen 5-325  Mgs.  Qty  56  Sig: One tablet every six hours as needed for pain. Must last 28 days.  Patient states she uses [facility]

## 2019-05-06 ENCOUNTER — Other Ambulatory Visit: Payer: Self-pay

## 2019-05-06 ENCOUNTER — Ambulatory Visit: Payer: Medicaid Other | Admitting: Orthopaedic Surgery

## 2019-05-06 ENCOUNTER — Encounter: Payer: Self-pay | Admitting: Orthopaedic Surgery

## 2019-05-06 DIAGNOSIS — M25562 Pain in left knee: Secondary | ICD-10-CM | POA: Diagnosis not present

## 2019-05-06 DIAGNOSIS — G8929 Other chronic pain: Secondary | ICD-10-CM

## 2019-05-06 DIAGNOSIS — F17210 Nicotine dependence, cigarettes, uncomplicated: Secondary | ICD-10-CM

## 2019-05-06 NOTE — Progress Notes (Signed)
PROCEDURE NOTE:  The patient requests injections of the left knee , verbal consent was obtained.  The left knee was prepped appropriately after time out was performed.   Sterile technique was observed and injection of 1 cc of Depo-Medrol 40 mg with several cc's of plain xylocaine. Anesthesia was provided by ethyl chloride and a 20-gauge needle was used to inject the knee area. The injection was tolerated well.  A band aid dressing was applied.  The patient was advised to apply ice later today and tomorrow to the injection sight as needed.  Electronically Signed [name], MD 11/24/20208:09 AM

## 2019-05-12 DIAGNOSIS — Z7689 Persons encountering health services in other specified circumstances: Secondary | ICD-10-CM | POA: Diagnosis not present

## 2019-05-14 ENCOUNTER — Telehealth: Payer: Self-pay

## 2019-05-14 MED ORDER — HYDROCODONE-ACETAMINOPHEN 5-325 MG PO TABS: ORAL_TABLET | ORAL | 0 refills | Status: DC

## 2019-05-14 NOTE — Telephone Encounter (Signed)
Hydrocodone-Acetaminophen 5/325mg   Qty  56 Tablets  PAT[facility] US[facility] [place] [facility]

## 2019-05-19 ENCOUNTER — Telehealth: Payer: Self-pay | Admitting: Women's Health

## 2019-05-19 NOTE — Telephone Encounter (Signed)

## 2019-05-20 ENCOUNTER — Other Ambulatory Visit (HOSPITAL_COMMUNITY)
Admission: RE | Admit: 2019-05-20 | Discharge: 2019-05-20 | Disposition: A | Discharge status: Home / Self Care | Payer: Medicaid Other | Source: Ambulatory Visit | Attending: Obstetrics and Gynecology | Admitting: Obstetrics and Gynecology

## 2019-05-20 ENCOUNTER — Encounter: Payer: Self-pay | Admitting: Women's Health

## 2019-05-20 ENCOUNTER — Other Ambulatory Visit: Payer: Self-pay

## 2019-05-20 ENCOUNTER — Ambulatory Visit (INDEPENDENT_AMBULATORY_CARE_PROVIDER_SITE_OTHER): Payer: Medicaid Other | Admitting: Women's Health

## 2019-05-20 VITALS — BP 156/103 | HR 66 | Ht 61.5 in | Wt 115.4 lb

## 2019-05-20 DIAGNOSIS — R87612 Low grade squamous intraepithelial lesion on cytologic smear of cervix (LGSIL): Secondary | ICD-10-CM | POA: Insufficient documentation

## 2019-05-20 DIAGNOSIS — Z86018 Personal history of other benign neoplasm: Secondary | ICD-10-CM

## 2019-05-20 DIAGNOSIS — Z113 Encounter for screening for infections with a predominantly sexual mode of transmission: Secondary | ICD-10-CM

## 2019-05-20 DIAGNOSIS — Z0000 Encounter for general adult medical examination without abnormal findings: Secondary | ICD-10-CM

## 2019-05-20 DIAGNOSIS — I10 Essential (primary) hypertension: Secondary | ICD-10-CM

## 2019-05-20 DIAGNOSIS — R102 Pelvic and perineal pain: Secondary | ICD-10-CM

## 2019-05-20 DIAGNOSIS — Z8742 Personal history of other diseases of the female genital tract: Secondary | ICD-10-CM

## 2019-05-20 DIAGNOSIS — Z01419 Encounter for gynecological examination (general) (routine) without abnormal findings: Secondary | ICD-10-CM

## 2019-05-20 MED ORDER — HYDROCHLOROTHIAZIDE 25 MG PO TABS: 25.0000 mg | ORAL_TABLET | Freq: Every day | ORAL | 2 refills | Status: DC

## 2019-05-20 NOTE — Patient Instructions (Signed)
Call [facility] [phone] to schedule an appointment to see a family doctor, I have given you 2 refills on your blood pressure medicine in case they don't have any appointments right now

## 2019-05-20 NOTE — Progress Notes (Signed)
WELL-WOMAN EXAMINATION Patient name: Judy Cross MRN [id number]  Date of birth: 06-23-1965 Chief Complaint:   Gynecologic Exam  History of Present Illness:   Judy Cross is a 53 y.o. [phone] African American female being seen today for a routine well-woman exam.  Current complaints: feels like something is blocking her partner from insertion during sex, gets past it and things are fine. No periods since endometrial ablation she reports was in her 66s. Last pelvic u/s 04/28/13, multiple fibroids, bilateral ovarian cysts, endometrial polyp-which was removed in OR 05/23/13.  Has CHTN, hasn't been on meds in over a year-was on HCTZ, her PCP left the office and she hasn't been back. Has been having headaches.  Smokes 1 pack in 2 weeks, has been cutting back Is happy she has gained weight, was 97lb, now 115lb, has been eating more  PCP: Previously was Dr. Meda Coffee who left, didn't know there were other providers in the office      does desire labs, including STD screen No LMP recorded. Patient has had an ablation. The current method of family planning is tubal ligation.  Last pap 10/12/15. Results were: normal. H/O abnormal pap: No Last mammogram: 08/21/18. Results were: normal. Family h/o breast cancer: Yes, maternal aunt Last colonoscopy: 01/11/16. Results were: few divertucla, internal hemorrhoids, repeat in 69yrs Family h/o colorectal cancer: No Review of Systems:   Pertinent items are noted in HPI Denies any headaches, blurred vision, fatigue, shortness of breath, chest pain, abdominal pain, abnormal vaginal discharge/itching/odor/irritation, problems with periods, bowel movements, urination, or intercourse unless otherwise stated above. Pertinent History Reviewed:  Reviewed past medical,surgical, social and family history.  Reviewed problem list, medications and allergies. Physical Assessment:   Vitals:   05/20/19 0826 05/20/19 0833  BP: (!) 161/104 (!) 156/103  Pulse: 84 66  Weight:  115 lb 6.4 oz (52.3 kg)   Height: 5' 1.5" (1.562 m)   Body mass index is 21.45 kg/m.        Physical Examination:   General appearance - well appearing, and in no distress  Mental status - alert, oriented to person, place, and time  Psych:  She has a normal mood and affect  Skin - warm and dry, normal color, no suspicious lesions noted  Chest - effort normal, all lung fields clear to auscultation bilaterally  Heart - normal rate and regular rhythm  Neck:  midline trachea, no thyromegaly or nodules  Breasts - breasts appear normal, no suspicious masses, no  nipple changes or axillary nodes. Has petechia under bilateral breasts, R>L, possible from underwire/scratching, will check CBC  Abdomen - soft, nontender, nondistended, no masses or organomegaly  Pelvic - VULVA: normal appearing vulva with no masses, tenderness or lesions  VAGINA: able to insert speculum w/o resistance, mild cystocele, normal appearing vagina with normal color and discharge, no lesions  CERVIX: normal appearing cervix without discharge or lesions, no CMT  Thin prep pap is done w/ HR HPV cotesting  UTERUS: uterus is felt to be slightly enlarged, nontender   ADNEXA: No adnexal masses noted, Rt adnexal tenderness.  Extremities:  No swelling or varicosities noted  Chaperone: Peggy Dones    No results found for this or any previous visit (from the past 24 hour(s)).  Assessment & Plan:  1) Well-Woman Exam  2) CHTN>uncontrolled, not currently on meds, rx hctz 25mg  w/ 2RF until able to get in PCP, f/u in 2wks for bp check  3) Rt adnexal tenderness> h/o bilateral ovarian cysts and  multiple fibroids, last u/s 2014, will get another and f/u w/ MD  4) STD screen  5) Smoker> advised cessation  Labs/procedures today: as below  Mammogram in March, or sooner if problems Colonoscopy in 2027 per GI, or sooner if problems  Orders Placed This Encounter  Procedures  . US Pelvis Complete  . US Transvaginal Non-OB  . CBC  .  Comprehensive metabolic panel  . TSH  . Lipid Profile  . Hemoglobin A1c  . HIV Antibody ( Reflex)  . RPR    Meds:  Meds ordered this encounter  Medications  . hydrochlorothiazide (HYDRODIURIL) 25 MG tablet    Sig: Take 1 tablet (25 mg total) by mouth daily.    Dispense:  30 tablet    Refill:  2    Order Specific Question:   Supervising Provider    Answer:   Florian Buff [[id number]]    Follow-up: Return in about 2 weeks (around 06/03/2019) for US:GYN and bp check w/ f/u w/ MD after.  [place], [facility] 05/20/2019 9:33 AM

## 2019-05-21 LAB — LIPID PANEL
Chol/HDL Ratio: 2.1 ratio (ref 0.0–4.4)
Cholesterol, Total: 199 mg/dL (ref 100–199)
HDL: 95 mg/dL (ref >39)
LDL Chol Calc (NIH): 86 mg/dL (ref 0–99)
Triglycerides: 105 mg/dL (ref 0–149)
VLDL Cholesterol Cal: 18 mg/dL (ref 5–40)

## 2019-05-21 LAB — COMPREHENSIVE METABOLIC PANEL
ALT: 23 IU/L (ref 0–32)
AST: 27 IU/L (ref 0–40)
Albumin/Globulin Ratio: 2 (ref 1.2–2.2)
Albumin: 4.7 g/dL (ref 3.8–4.9)
Alkaline Phosphatase: 64 IU/L (ref 39–117)
BUN/Creatinine Ratio: 14 (ref 9–23)
BUN: 11 mg/dL (ref 6–24)
Bilirubin Total: 1.2 mg/dL (ref 0.0–1.2)
CO2: 25 mmol/L (ref 20–29)
Calcium: 9.7 mg/dL (ref 8.7–10.2)
Chloride: 104 mmol/L (ref 96–106)
Creatinine, Ser: 0.81 mg/dL (ref 0.57–1.00)
GFR calc Af Amer: 95 mL/min/{1.73_m2} (ref >59)
GFR calc non Af Amer: 83 mL/min/{1.73_m2} (ref >59)
Globulin, Total: 2.4 g/dL (ref 1.5–4.5)
Glucose: 99 mg/dL (ref 65–99)
Potassium: 3.6 mmol/L (ref 3.5–5.2)
Sodium: 142 mmol/L (ref 134–144)
Total Protein: 7.1 g/dL (ref 6.0–8.5)

## 2019-05-21 LAB — CBC
Hematocrit: 43.5 % (ref 34.0–46.6)
Hemoglobin: 14.5 g/dL (ref 11.1–15.9)
MCH: 32.9 pg (ref 26.6–33.0)
MCHC: 33.3 g/dL (ref 31.5–35.7)
MCV: 99 fL — ABNORMAL HIGH (ref 79–97)
Platelets: 290 10*3/uL (ref 150–450)
RBC: 4.41 x10E6/uL (ref 3.77–5.28)
RDW: 13.4 % (ref 11.7–15.4)
WBC: 6.9 10*3/uL (ref 3.4–10.8)

## 2019-05-21 LAB — HEMOGLOBIN A1C
Est. average glucose Bld gHb Est-mCnc: 108 mg/dL
Hgb A1c MFr Bld: 5.4 % (ref 4.8–5.6)

## 2019-05-21 LAB — HIV ANTIBODY (ROUTINE TESTING W REFLEX): HIV Screen 4th Generation wRfx: NONREACTIVE

## 2019-05-21 LAB — RPR: RPR Ser Ql: NONREACTIVE

## 2019-05-21 LAB — TSH: TSH: 0.673 u[IU]/mL (ref 0.450–4.500)

## 2019-05-26 LAB — CYTOLOGY - PAP
Chlamydia: NEGATIVE
Comment: NEGATIVE
Comment: NEGATIVE
Comment: NEGATIVE
Comment: NORMAL
HPV 16: NEGATIVE
HPV 18 / 45: NEGATIVE
High risk HPV: POSITIVE — AB
Neisseria Gonorrhea: NEGATIVE

## 2019-05-27 ENCOUNTER — Encounter: Payer: Self-pay | Admitting: Women's Health

## 2019-05-27 DIAGNOSIS — R87619 Unspecified abnormal cytological findings in specimens from cervix uteri: Secondary | ICD-10-CM | POA: Insufficient documentation

## 2019-06-02 ENCOUNTER — Telehealth: Payer: Self-pay | Admitting: *Deleted

## 2019-06-02 ENCOUNTER — Ambulatory Visit: Payer: Medicaid Other | Admitting: Family Medicine

## 2019-06-02 NOTE — Telephone Encounter (Signed)
Patient informed pap abnormal w/ +HRHPV, per current guidelines, repeat testing in 23yr (instead of 3). Verbalized understanding with no further questions.

## 2019-06-04 ENCOUNTER — Telehealth: Payer: Self-pay | Admitting: Obstetrics & Gynecology

## 2019-06-04 NOTE — Telephone Encounter (Signed)
Called patient regarding appointment and the following message was left:   We have you scheduled for an upcoming appointment at our office. At this time, we are still not allowing visitors or children during the appointment, however, a support person, over age 54, may accompany you to your appointment if assistance is needed for safety or care concerns. Otherwise, support persons should remain outside until the visit is complete.   We ask if you have had any exposure to anyone suspected or confirmed of having COVID-19, are awaiting test results for COVID-19 or if you are experiencing any of the following, to call and reschedule your appointment: fever, cough, shortness of breath, muscle pain, diarrhea, rash, vomiting, abdominal pain, red eye, weakness, bruising, bleeding, joint pain, or a severe headache.   Please know we will ask you these questions or similar questions when you arrive for your appointment and again it's how we are keeping everyone safe.    Also,to keep you safe, please use the provided hand sanitizer when you enter the office. We are asking everyone in the office to wear a mask to help prevent the spread of germs. If you have a mask of your own, please wear it to your appointment, if not, we are happy to provide one for you.  Thank you for understanding and your cooperation.    [facility] Staff      

## 2019-06-09 ENCOUNTER — Other Ambulatory Visit: Payer: Self-pay

## 2019-06-09 ENCOUNTER — Encounter: Payer: Self-pay | Admitting: Obstetrics & Gynecology

## 2019-06-09 ENCOUNTER — Ambulatory Visit (INDEPENDENT_AMBULATORY_CARE_PROVIDER_SITE_OTHER): Payer: Medicaid Other

## 2019-06-09 ENCOUNTER — Ambulatory Visit (INDEPENDENT_AMBULATORY_CARE_PROVIDER_SITE_OTHER): Payer: Medicaid Other | Admitting: Obstetrics & Gynecology

## 2019-06-09 VITALS — BP 127/86 | HR 76 | Ht 61.5 in | Wt 112.0 lb

## 2019-06-09 DIAGNOSIS — R102 Pelvic and perineal pain: Secondary | ICD-10-CM

## 2019-06-09 DIAGNOSIS — Z86018 Personal history of other benign neoplasm: Secondary | ICD-10-CM

## 2019-06-09 DIAGNOSIS — Z8742 Personal history of other diseases of the female genital tract: Secondary | ICD-10-CM | POA: Diagnosis not present

## 2019-06-09 NOTE — Progress Notes (Addendum)
PELVIC US TA/TV:heterogeneous retroverted uterus with mult.fibroids,(#1) anterior subserosal fibroid 2.3 x 1.6 x 2.2 cm,(#2) posterior intramural fibroid 1.3 x 1.1 x 1.7 cm,heterogeneous thickened endometrium 7.2 mm,normal left ovary,simple right ovarian cyst 1.7 x 1 x 1.2 cm,no free fluid,no pain during ultrasound,ovaries appear mobile   Chaperone:Daisy

## 2019-06-09 NOTE — Progress Notes (Signed)
Follow up appointment for results  Chief Complaint  Patient presents with  . discuss [place] results    Blood pressure 127/86, pulse 76, height 5' 1.5" (1.562 m), weight 112 lb (50.8 kg).  US Transvaginal Non-OB  Result Date: 06/09/2019 GYNECOLOGIC SONOGRAM [place] is a 53 y.o. [id number] postmenopausal No LMP recorded. Patient has had an ablation. she is here for a pelvic sonogram for right pelvic pain with a history of fibroids and ovarian cysts. Uterus                      5.5 x 3.6 x 4.1 cm, Total uterine volume 42 cc,heterogeneous retroverted uterus with mult.fibroids,(#1) anterior subserosal fibroid 2.3 x 1.6 x 2.2 cm,(#2) posterior intramural fibroid 1.3 x 1.1 x 1.7 cm Endometrium          7.2 mm, symmetrical, heterogeneous thickened endometrium Right ovary             2.2 x 1.6 x 1.2 cm, simple right ovarian cyst 1.7 x 1 x 1.2 cm Left ovary                1.6 x .7 x 1.5 cm, wnl No free fluid Technician Comments: PELVIC US TA/TV:heterogeneous retroverted uterus with mult.fibroids,(#1) anterior subserosal fibroid 2.3 x 1.6 x 2.2 cm,(#2) posterior intramural fibroid 1.3 x 1.1 x 1.7 cm,heterogeneous thickened endometrium 7.2 mm,normal left ovary,simple right ovarian cyst 1.7 x 1 x 1.2 cm,no free fluid,no pain during ultrasound,ovaries appear mobile [employer] 06/09/2019 4:24 PM Clinical Impression and recommendations: I have reviewed the sonogram results above, combined with the patient's current clinical course, below are my impressions and any appropriate recommendations for management based on the sonographic findings. Uterus is small with some small fibroids Endometrium is normal Small ovarian cyst, benign Left ovary is normal Florian Buff 06/09/2019 4:34 PM   US Pelvis Complete  Result Date: 06/09/2019 GYNECOLOGIC SONOGRAM [place] is a 53 y.o. [id number] postmenopausal No LMP recorded. Patient has had an ablation. she is here for a pelvic sonogram for right pelvic pain with a  history of fibroids and ovarian cysts. Uterus                      5.5 x 3.6 x 4.1 cm, Total uterine volume 42 cc,heterogeneous retroverted uterus with mult.fibroids,(#1) anterior subserosal fibroid 2.3 x 1.6 x 2.2 cm,(#2) posterior intramural fibroid 1.3 x 1.1 x 1.7 cm Endometrium          7.2 mm, symmetrical, heterogeneous thickened endometrium Right ovary             2.2 x 1.6 x 1.2 cm, simple right ovarian cyst 1.7 x 1 x 1.2 cm Left ovary                1.6 x .7 x 1.5 cm, wnl No free fluid Technician Comments: PELVIC US TA/TV:heterogeneous retroverted uterus with mult.fibroids,(#1) anterior subserosal fibroid 2.3 x 1.6 x 2.2 cm,(#2) posterior intramural fibroid 1.3 x 1.1 x 1.7 cm,heterogeneous thickened endometrium 7.2 mm,normal left ovary,simple right ovarian cyst 1.7 x 1 x 1.2 cm,no free fluid,no pain during ultrasound,ovaries appear mobile [employer] 06/09/2019 4:24 PM Clinical Impression and recommendations: I have reviewed the sonogram results above, combined with the patient's current clinical course, below are my impressions and any appropriate recommendations for management based on the sonographic findings. Uterus is small with some small fibroids Endometrium is normal Small ovarian cyst, benign Left  ovary is normal Florian Buff 06/09/2019 4:34 PM      MEDS ordered this encounter: No orders of the defined types were placed in this encounter.   Orders for this encounter: No orders of the defined types were placed in this encounter.   Impression: LLQ pain, normal sonogram  Plan: Normal sonogram small fibroids clinically insignifciant  Follow Up: Return if symptoms worsen or fail to improve.     All questions were answered.  Past Medical History:  Diagnosis Date  . Allergy   . Arthritis   . Asthma in adult, mild intermittent, uncomplicated [id number]  . Breast nodule 10/12/2015  . 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 Endometrial polyp 04/28/2013  . Fibroids  04/28/2013  . Hot flashes 03/26/2013  . Hypertension   . Irregular bleeding 03/26/2013  . Neuromuscular disorder (HCC)    sciatica  . Seasonal allergies   . Seizures ([place])    last seizure was 2 years ago; unknown etiology. On Keppra.    Past Surgical History:  Procedure Laterality Date  . ABLATION     with polyp removal.  . CESAREAN SECTION     x 3  . COLONOSCOPY WITH PROPOFOL N/A 01/11/2016   Procedure: COLONOSCOPY WITH PROPOFOL;  Surgeon: Danie Binder, MD;  Location: [facility];  Service: Endoscopy;  Laterality: N/A;  800  . EXAMINATION UNDER ANESTHESIA  02/28/2012   Procedure: EXAM UNDER ANESTHESIA;  Surgeon: Donato Heinz, MD;  Location: [facility];  Service: General;  Laterality: N/A;  . GANGLION CYST EXCISION Left   . HYSTEROSCOPY WITH D & C N/A 05/23/2013   Procedure: DILATATION AND CURETTAGE /HYSTEROSCOPY;  Surgeon: Jonnie Kind, MD;  Location: [facility];  Service: Gynecology;  Laterality: N/A;  . POLYPECTOMY N/A 05/23/2013   Procedure: ENDOMETRIAL POLYP REMOVAL;  Surgeon: Jonnie Kind, MD;  Location: [facility];  Service: Gynecology;  Laterality: N/A;  . SPHINCTEROTOMY  02/28/2012   Procedure: SPHINCTEROTOMY;  Surgeon: Donato Heinz, MD;  Location: [facility];  Service: General;  Laterality: N/A;  Lateral Internal Sphincterotomy  . TRIGGER FINGER RELEASE Left    ring finger  . TUBAL LIGATION      OB History    Gravida  4   Para  3   Term      Preterm      AB  1   Living  3     SAB  1   TAB      Ectopic      Multiple      Live Births  3           Allergies  Allergen Reactions  . Lisinopril Swelling    swelling to entire mouth.  . Penicillins Rash    Has patient had a PCN reaction causing immediate rash, facial/tongue/throat swelling, SOB or lightheadedness with hypotension: no - next day Has patient had a PCN reaction causing severe rash involving mucus membranes or skin necrosis: No Has patient had a PCN reaction that required hospitalization  No Has patient had a PCN reaction occurring within the last 10 years: Yes If all of the above answers are "NO", then may proceed with Cephalosporin use.   . Tramadol Other (See Comments)    dizziness    Social History   Socioeconomic History  . Marital status: Single    Spouse name: Not on file  . Number of children: 3  . Years of education: 61  . Highest education  level: Not on file  Occupational History  . Occupation: home health aid  Tobacco Use  . Smoking status: Current Some Day Smoker    Packs/day: 0.25    Years: 20.00    Pack years: 5.00    Types: Cigarettes  . Smokeless tobacco: Never Used  . Tobacco comment: smokes 1 cig daily  Substance and Sexual Activity  . Alcohol use: Yes    Comment: occasional  . Drug use: No  . Sexual activity: Yes    Birth control/protection: Surgical    Comment: tubal and ablation  Other Topics Concern  . Not on file  Social History Narrative   Degree in child care   Currently is a home health aid   Lives at home with youngest daughter   Two older children grown   Social Determinants of Health   Financial Resource Strain:   . Difficulty of Paying Living Expenses: Not on file  Food Insecurity:   . Worried About Charity fundraiser in the Last Year: Not on file  . Ran Out of Food in the Last Year: Not on file  Transportation Needs:   . Lack of Transportation (Medical): Not on file  . Lack of Transportation (Non-Medical): Not on file  Physical Activity:   . Days of Exercise per Week: Not on file  . Minutes of Exercise per Session: Not on file  Stress:   . Feeling of Stress : Not on file  Social Connections:   . Frequency of Communication with Friends and Family: Not on file  . Frequency of Social Gatherings with Friends and Family: Not on file  . Attends Religious Services: Not on file  . Active Member of Clubs or Organizations: Not on file  . Attends Archivist Meetings: Not on file  . Marital Status: Not on file     Family History  Problem Relation Age of Onset  . Hypertension Mother   . Arthritis Mother   . COPD Mother   . Diabetes Mother   . Heart disease Mother   . Hypertension Father   . Parkinson's disease Father   . Colon cancer Neg Hx

## 2019-06-11 ENCOUNTER — Ambulatory Visit: Payer: Medicaid Other | Admitting: Family Medicine

## 2019-06-12 ENCOUNTER — Other Ambulatory Visit: Payer: Self-pay

## 2019-06-12 ENCOUNTER — Telehealth: Payer: Self-pay | Admitting: Orthopaedic Surgery

## 2019-06-12 ENCOUNTER — Encounter: Payer: Self-pay | Admitting: Family Medicine

## 2019-06-12 ENCOUNTER — Ambulatory Visit: Payer: Medicaid Other | Admitting: Family Medicine

## 2019-06-12 VITALS — BP 106/56 | HR 86 | Temp 98.9°F | Resp 15 | Ht 61.5 in | Wt 116.1 lb

## 2019-06-12 DIAGNOSIS — G40909 Epilepsy, unspecified, not intractable, without status epilepticus: Secondary | ICD-10-CM | POA: Diagnosis not present

## 2019-06-12 DIAGNOSIS — Z23 Encounter for immunization: Secondary | ICD-10-CM | POA: Diagnosis not present

## 2019-06-12 DIAGNOSIS — I10 Essential (primary) hypertension: Secondary | ICD-10-CM

## 2019-06-12 DIAGNOSIS — M5442 Lumbago with sciatica, left side: Secondary | ICD-10-CM

## 2019-06-12 DIAGNOSIS — J3089 Other allergic rhinitis: Secondary | ICD-10-CM | POA: Insufficient documentation

## 2019-06-12 DIAGNOSIS — J4520 Mild intermittent asthma, uncomplicated: Secondary | ICD-10-CM | POA: Diagnosis not present

## 2019-06-12 DIAGNOSIS — F419 Anxiety disorder, unspecified: Secondary | ICD-10-CM | POA: Diagnosis not present

## 2019-06-12 DIAGNOSIS — G8929 Other chronic pain: Secondary | ICD-10-CM

## 2019-06-12 DIAGNOSIS — M25569 Pain in unspecified knee: Secondary | ICD-10-CM

## 2019-06-12 HISTORY — DX: Anxiety disorder, unspecified: F41.9

## 2019-06-12 MED ORDER — ALBUTEROL SULFATE HFA 108 (90 BASE) MCG/ACT IN AERS: 1.0000 | INHALATION_SPRAY | Freq: Four times a day (QID) | RESPIRATORY_TRACT | 0 refills | Status: DC | PRN

## 2019-06-12 MED ORDER — HYDROXYZINE HCL 25 MG PO TABS: 25.0000 mg | ORAL_TABLET | Freq: Four times a day (QID) | ORAL | 0 refills | Status: DC | PRN

## 2019-06-12 NOTE — Assessment & Plan Note (Signed)
Well-controlled albuterol refill provided today.

## 2019-06-12 NOTE — Telephone Encounter (Signed)
Patient requests refill on Hydrocodone/Acetaminophen 5-325  Mgs.  Qty  16  Sig: One tablet every six hours as needed for pain. Must last 28 days.  Patient states she uses [facility]

## 2019-06-12 NOTE — Assessment & Plan Note (Addendum)
She developed anxiety related to her mother's death last year.  And reports that using the Atarax as needed is been very helpful for her.  Needs refill of her Atarax.  Uses it as needed is not a daily use of medication.

## 2019-06-12 NOTE — Assessment & Plan Note (Addendum)
Reports that she takes Benadryl daily for allergy control.  Reports this works best for her and also keeps her asthma in control.  Denies having any sedation effects of the Benadryl.

## 2019-06-12 NOTE — Assessment & Plan Note (Signed)

## 2019-06-12 NOTE — Progress Notes (Addendum)
Subjective:  Patient ID: Judy Cross, female    DOB: 05-Mar-1966  Age: 53 y.o. MRN: [id number]  CC:  Chief Complaint  Patient presents with  . New Patient (Initial Visit)      HPI  HPI   Ms Judy Cross is a 53 year old female patient who presents today to reestablish care in this practice of whom she used to see Dr. Meda Cross.  She was previously seen by Dr. Legrand Cross who is actually the provider who also took her off her [place] for seizures that she had in the past.  Reports has not had seizures in 2 years.  Has a history of high blood pressure but this is well controlled is currently on hydrochlorothiazide blood pressure is slightly low today possible need for adjustment of this.  Has adult asthma with infrequent use of her albuterol.  Would like a refill today.  Smokes 1 to 2 cigarettes every day at times depending on her anxiety sometimes she does not smoke at all. She has had an ablation, has no menses, has occasional hot flashes.  Is seen by the GYN office here in [place].  Has regular visits and is up-to-date on her Pap smear.  Additionally is up-to-date on her mammograms.  Had her last colonoscopy at age 42.  Vaccines are up-to-date  Is seen by Dr. Luna Cross for her back pain and left sciatica.  As well as her knee pain. Has allergies and she takes Benadryl daily for this it does not provide her with the sedating effect. Works as Neurosurgeon and back hurts occasionally.   No alcohol or drug use.  Today patient denies signs and symptoms of COVID 19 infection including fever, chills, cough, shortness of breath, and headache. Past Medical, Surgical, Social History, Allergies, and Medications have been Reviewed.   Past Medical History:  Diagnosis Date  . Allergy   . Anxiety 06/12/2019  . Arthritis   . Asthma in adult, mild intermittent, uncomplicated Q000111Q  . Breast nodule 10/12/2015  . Breast nodule 10/12/2015  . 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 Endometrial  polyp 04/28/2013  . Enlarged uterus 03/26/2013  . Fibroids 04/28/2013  . Hot flashes 03/26/2013  . Hot flashes 03/26/2013  . Hypertension   . Irregular bleeding 03/26/2013  . Neuromuscular disorder (HCC)    sciatica  . Seasonal allergies   . Seizures ([place])    last seizure was 2 years ago; unknown etiology. On Keppra.    Current Meds  Medication Sig  . albuterol (VENTOLIN HFA) 108 (90 Base) MCG/ACT inhaler Inhale 1-2 puffs into the lungs every 6 (six) hours as needed for wheezing or shortness of breath.  . cyclobenzaprine (FLEXERIL) 10 MG tablet Take 1 tablet (10 mg total) by mouth at bedtime.  . hydrochlorothiazide (HYDRODIURIL) 25 MG tablet Take 1 tablet (25 mg total) by mouth daily.  Marland Kitchen HYDROcodone-acetaminophen (NORCO/VICODIN) 5-325 MG tablet One tablet every six hours as needed for pain.  Must last 28 days.  . hydrOXYzine (ATARAX/VISTARIL) 25 MG tablet Take 1 tablet (25 mg total) by mouth every 6 (six) hours as needed for anxiety.  . naproxen (NAPROSYN) 500 MG tablet Take 1 tablet (500 mg total) by mouth 2 (two) times daily with a meal.  . [DISCONTINUED] albuterol (PROVENTIL HFA;VENTOLIN HFA) 108 (90 BASE) MCG/ACT inhaler Inhale 1-2 puffs into the lungs every 6 (six) hours as needed for wheezing or shortness of breath.   . [DISCONTINUED] hydrOXYzine (ATARAX/VISTARIL) 25 MG tablet Take 1  tablet (25 mg total) by mouth every 6 (six) hours as needed for anxiety.    ROS:  Review of Systems  Constitutional: Negative.   HENT: Negative.   Eyes: Negative.   Respiratory: Negative.   Cardiovascular: Negative.   Gastrointestinal: Negative.   Genitourinary: Negative.   Musculoskeletal: Positive for back pain.  Skin: Negative.   Neurological: Negative.   Endo/Heme/Allergies: Negative.   Psychiatric/Behavioral: The patient is nervous/anxious.      Objective:   Today's Vitals: BP (!) 106/56   Pulse 86   Temp 98.9 F (37.2 C) (Oral)   Resp 15   Ht 5' 1.5" (1.562 m)   Wt 116 lb  1.9 oz (52.7 kg)   SpO2 100%   BMI 21.59 kg/m  Vitals with BMI 06/12/2019 06/09/2019 05/20/2019  Height 5' 1.5" 5' 1.5" -  Weight 116 lbs 2 oz 112 lbs -  BMI [id number] 0000000 -  Systolic [id number]  Diastolic 56 86 [id number]  Pulse 86 76 66     Physical Exam Vitals and nursing note reviewed.  Constitutional:      Appearance: Normal appearance. She is well-developed, well-groomed and normal weight.  HENT:     Head: Normocephalic and atraumatic.     Right Ear: External ear normal.     Left Ear: External ear normal.     Nose: Nose normal.     Mouth/Throat:     Mouth: Mucous membranes are moist.     Pharynx: Oropharynx is clear.  Eyes:     General:        Right eye: No discharge.        Left eye: No discharge.     Conjunctiva/sclera: Conjunctivae normal.  Cardiovascular:     Rate and Rhythm: Normal rate and regular rhythm.     Pulses: Normal pulses.     Heart sounds: Normal heart sounds.  Pulmonary:     Effort: Pulmonary effort is normal.     Breath sounds: Normal breath sounds.  Musculoskeletal:        General: Normal range of motion.     Cervical back: Normal range of motion and neck supple.  Skin:    General: Skin is warm.  Neurological:     General: No focal deficit present.     Mental Status: She is alert and oriented to person, place, and time.  Psychiatric:        Attention and Perception: Attention normal.        Mood and Affect: Mood normal.        Speech: Speech normal.        Behavior: Behavior normal. Behavior is cooperative.        Thought Content: Thought content normal.        Cognition and Memory: Cognition normal.        Judgment: Judgment normal.          Assessment   1. Chronic midline low back pain with left-sided sciatica   2. Seizure disorder ([place])   3. Anxiety   4. Mild intermittent asthma without complication   5. Environmental and seasonal allergies   6. Essential hypertension   7. Chronic knee pain, unspecified laterality   8. Need for  immunization against influenza     Tests ordered Orders Placed This Encounter  Procedures  . Flu Vaccine QUAD 36+ mos IM     Plan: Please see assessment and plan per problem list above.   Meds ordered this encounter  Medications  .  hydrOXYzine (ATARAX/VISTARIL) 25 MG tablet    Sig: Take 1 tablet (25 mg total) by mouth every 6 (six) hours as needed for anxiety.    Dispense:  12 tablet    Refill:  0    Order Specific Question:   Supervising Provider    Answer:   SIMPSON, MARGARET E [id number]  . albuterol (VENTOLIN HFA) 108 (90 Base) MCG/ACT inhaler    Sig: Inhale 1-2 puffs into the lungs every 6 (six) hours as needed for wheezing or shortness of breath.    Dispense:  18 g    Refill:  0    Order Specific Question:   Supervising Provider    Answer:   Fayrene Helper [id number]    Patient to follow-up in 3 months   Perlie Mayo, NP

## 2019-06-12 NOTE — Assessment & Plan Note (Signed)
Is followed by Dr. Luna Glasgow for this.  Denies having any signs of symptoms at this time.  Reports she gets injection and pain medicine from him.

## 2019-06-12 NOTE — Patient Instructions (Addendum)
Happy New Year  I appreciate the opportunity to provide you with care for your health and wellness. Today we discussed: establish care back clinic  Follow up: 3 months   No labs or referrals today  Continue all current medications  Tylenol if arm is sore from Flu Vaccine.  Please continue to practice social distancing to keep you, your family, and our community safe.  If you must go out, please wear a mask and practice good handwashing.  It was a pleasure to see you and I look forward to continuing to work together on your health and well-being. Please do not hesitate to call the office if you need care or have questions about your care.  Have a wonderful day and week. With Gratitude, Cherly Beach, DNP, AGNP-BC

## 2019-06-12 NOTE — Assessment & Plan Note (Signed)
Is followed by Dr. Luna Glasgow for this.  Reports doing well today in the office.  Has full range of motion at this time.

## 2019-06-12 NOTE — Assessment & Plan Note (Signed)
Well-controlled possible reduction in hydrochlorothiazide if next visit still has demonstrated control.  Encouraged to maintain a DASH diet, and exercise 30 minutes at least 5 days of the week.

## 2019-06-12 NOTE — Assessment & Plan Note (Signed)
Reports being taken off of her Keppra has not been seen for this in many years.  Has not had any seizures within the last 2 years.  Per her.

## 2019-06-14 ENCOUNTER — Other Ambulatory Visit: Payer: Self-pay | Admitting: Women's Health

## 2019-06-15 MED ORDER — HYDROCODONE-ACETAMINOPHEN 5-325 MG PO TABS: ORAL_TABLET | ORAL | 0 refills | Status: DC

## 2019-07-08 ENCOUNTER — Ambulatory Visit (INDEPENDENT_AMBULATORY_CARE_PROVIDER_SITE_OTHER): Payer: Medicaid Other | Admitting: Orthopaedic Surgery

## 2019-07-08 ENCOUNTER — Encounter: Payer: Self-pay | Admitting: Orthopaedic Surgery

## 2019-07-08 ENCOUNTER — Other Ambulatory Visit: Payer: Self-pay

## 2019-07-08 DIAGNOSIS — M25562 Pain in left knee: Secondary | ICD-10-CM

## 2019-07-08 DIAGNOSIS — G8929 Other chronic pain: Secondary | ICD-10-CM

## 2019-07-08 DIAGNOSIS — F17210 Nicotine dependence, cigarettes, uncomplicated: Secondary | ICD-10-CM

## 2019-07-08 NOTE — Progress Notes (Signed)
PROCEDURE NOTE:  The patient requests injections of the left knee , verbal consent was obtained.  The left knee was prepped appropriately after time out was performed.   Sterile technique was observed and injection of 1 cc of Depo-Medrol 40 mg with several cc's of plain xylocaine. Anesthesia was provided by ethyl chloride and a 20-gauge needle was used to inject the knee area. The injection was tolerated well.  A band aid dressing was applied.  The patient was advised to apply ice later today and tomorrow to the injection sight as needed.  Encounter Diagnoses  Name Primary?  . Chronic pain of left knee Yes  . Cigarette nicotine dependence without complication    Return as needed.  Electronically Signed [name], MD 1/26/20218:21 AM

## 2019-07-08 NOTE — Patient Instructions (Signed)
Steps to Quit Smoking Smoking tobacco is the leading cause of preventable death. It can affect almost every organ in the body. Smoking puts you and people around you at risk for many serious, long-lasting (chronic) diseases. Quitting smoking can be hard, but it is one of the best things that you can do for your health. It is never too late to quit. How do I get ready to quit? When you decide to quit smoking, make a plan to help you succeed. Before you quit:  Pick a date to quit. Set a date within the next 2 weeks to give you time to prepare.  Write down the reasons why you are quitting. Keep this list in places where you will see it often.  Tell your family, friends, and co-workers that you are quitting. Their support is important.  Talk with your doctor about the choices that may help you quit.  Find out if your health insurance will pay for these treatments.  Know the people, places, things, and activities that make you want to smoke (triggers). Avoid them. What first steps can I take to quit smoking?  Throw away all cigarettes at home, at work, and in your car.  Throw away the things that you use when you smoke, such as ashtrays and lighters.  Clean your car. Make sure to empty the ashtray.  Clean your home, including curtains and carpets. What can I do to help me quit smoking? Talk with your doctor about taking medicines and seeing a counselor at the same time. You are more likely to succeed when you do both.  If you are pregnant or breastfeeding, talk with your doctor about counseling or other ways to quit smoking. Do not take medicine to help you quit smoking unless your doctor tells you to do so. To quit smoking: Quit right away  Quit smoking totally, instead of slowly cutting back on how much you smoke over a period of time.  Go to counseling. You are more likely to quit if you go to counseling sessions regularly. Take medicine You may take medicines to help you quit. Some  medicines need a prescription, and some you can buy over-the-counter. Some medicines may contain a drug called nicotine to replace the nicotine in cigarettes. Medicines may:  Help you to stop having the desire to smoke (cravings).  Help to stop the problems that come when you stop smoking (withdrawal symptoms). Your doctor may ask you to use:  Nicotine patches, gum, or lozenges.  Nicotine inhalers or sprays.  Non-nicotine medicine that is taken by mouth. Find resources Find resources and other ways to help you quit smoking and remain smoke-free after you quit. These resources are most helpful when you use them often. They include:  Online chats with a counselor.  Phone quitlines.  Printed self-help materials.  Support groups or group counseling.  Text messaging programs.  Mobile phone apps. Use apps on your mobile phone or tablet that can help you stick to your quit plan. There are many free apps for mobile phones and tablets as well as websites. Examples include Quit Guide from the CDC and smokefree.gov  What things can I do to make it easier to quit?   Talk to your family and friends. Ask them to support and encourage you.  Call a phone quitline (1-800-QUIT-NOW), reach out to support groups, or work with a counselor.  Ask people who smoke to not smoke around you.  Avoid places that make you want to smoke,   such as: ? Bars. ? Parties. ? Smoke-break areas at work.  Spend time with people who do not smoke.  Lower the stress in your life. Stress can make you want to smoke. Try these things to help your stress: ? Getting regular exercise. ? Doing deep-breathing exercises. ? Doing yoga. ? Meditating. ? Doing a body scan. To do this, close your eyes, focus on one area of your body at a time from head to toe. Notice which parts of your body are tense. Try to relax the muscles in those areas. How will I feel when I quit smoking? Day 1 to 3 weeks Within the first 24 hours,  you may start to have some problems that come from quitting tobacco. These problems are very bad 2-3 days after you quit, but they do not often last for more than 2-3 weeks. You may get these symptoms:  Mood swings.  Feeling restless, nervous, angry, or annoyed.  Trouble concentrating.  Dizziness.  Strong desire for high-sugar foods and nicotine.  Weight gain.  Trouble pooping (constipation).  Feeling like you may vomit (nausea).  Coughing or a sore throat.  Changes in how the medicines that you take for other issues work in your body.  Depression.  Trouble sleeping (insomnia). Week 3 and afterward After the first 2-3 weeks of quitting, you may start to notice more positive results, such as:  Better sense of smell and taste.  Less coughing and sore throat.  Slower heart rate.  Lower blood pressure.  Clearer skin.  Better breathing.  Fewer sick days. Quitting smoking can be hard. Do not give up if you fail the first time. Some people need to try a few times before they succeed. Do your best to stick to your quit plan, and talk with your doctor if you have any questions or concerns. Summary  Smoking tobacco is the leading cause of preventable death. Quitting smoking can be hard, but it is one of the best things that you can do for your health.  When you decide to quit smoking, make a plan to help you succeed.  Quit smoking right away, not slowly over a period of time.  When you start quitting, seek help from your doctor, family, or friends. This information is not intended to replace advice given to you by your health care provider. Make sure you discuss any questions you have with your health care provider. Document Revised: 02/21/2019 Document Reviewed: 08/17/2018 Elsevier Patient Education  2020 [employer].  

## 2019-07-14 ENCOUNTER — Telehealth: Payer: Self-pay | Admitting: Orthopaedic Surgery

## 2019-07-14 MED ORDER — HYDROCODONE-ACETAMINOPHEN 5-325 MG PO TABS: ORAL_TABLET | ORAL | 0 refills | Status: DC

## 2019-07-14 NOTE — Telephone Encounter (Signed)
Patient requests refill on Hydrocodone/Acetaminophen 5-325 mgs.  Qty  22   Sig: One tablet every six hours as needed for pain. Must last 28 days  Patient states she uses [facility]

## 2019-08-11 ENCOUNTER — Telehealth: Payer: Self-pay

## 2019-08-11 NOTE — Telephone Encounter (Signed)
Hydrocodone-Acetaminophen 5/325 mg  Qty 56 Tablets  PATIENT USES [place] [facility]

## 2019-08-12 ENCOUNTER — Telehealth: Payer: Self-pay

## 2019-08-12 ENCOUNTER — Other Ambulatory Visit: Payer: Self-pay | Admitting: *Deleted

## 2019-08-12 DIAGNOSIS — J4520 Mild intermittent asthma, uncomplicated: Secondary | ICD-10-CM

## 2019-08-12 MED ORDER — HYDROCODONE-ACETAMINOPHEN 5-325 MG PO TABS: ORAL_TABLET | ORAL | 0 refills | Status: DC

## 2019-08-12 MED ORDER — ALBUTEROL SULFATE HFA 108 (90 BASE) MCG/ACT IN AERS: 1.0000 | INHALATION_SPRAY | Freq: Four times a day (QID) | RESPIRATORY_TRACT | 0 refills | Status: DC | PRN

## 2019-08-12 NOTE — Telephone Encounter (Signed)
[facility] [place]

## 2019-08-12 NOTE — Telephone Encounter (Signed)
Meds refilled pt is aware

## 2019-08-14 DIAGNOSIS — H52223 Regular astigmatism, bilateral: Secondary | ICD-10-CM | POA: Diagnosis not present

## 2019-08-14 DIAGNOSIS — H5213 Myopia, bilateral: Secondary | ICD-10-CM | POA: Diagnosis not present

## 2019-08-14 DIAGNOSIS — H524 Presbyopia: Secondary | ICD-10-CM | POA: Diagnosis not present

## 2019-09-02 DIAGNOSIS — H5213 Myopia, bilateral: Secondary | ICD-10-CM | POA: Diagnosis not present

## 2019-09-09 ENCOUNTER — Other Ambulatory Visit: Payer: Self-pay

## 2019-09-09 ENCOUNTER — Encounter: Payer: Self-pay | Admitting: Orthopaedic Surgery

## 2019-09-09 ENCOUNTER — Ambulatory Visit (INDEPENDENT_AMBULATORY_CARE_PROVIDER_SITE_OTHER): Payer: Medicaid Other | Admitting: Orthopaedic Surgery

## 2019-09-09 VITALS — Ht 61.5 in | Wt 118.0 lb

## 2019-09-09 DIAGNOSIS — G8929 Other chronic pain: Secondary | ICD-10-CM

## 2019-09-09 DIAGNOSIS — M25562 Pain in left knee: Secondary | ICD-10-CM | POA: Diagnosis not present

## 2019-09-09 DIAGNOSIS — F17210 Nicotine dependence, cigarettes, uncomplicated: Secondary | ICD-10-CM

## 2019-09-09 MED ORDER — HYDROCODONE-ACETAMINOPHEN 5-325 MG PO TABS: ORAL_TABLET | ORAL | 0 refills | Status: DC

## 2019-09-09 NOTE — Progress Notes (Signed)
PROCEDURE NOTE:  The patient requests injections of the left knee , verbal consent was obtained.  The left knee was prepped appropriately after time out was performed.   Sterile technique was observed and injection of 1 cc of Depo-Medrol 40 mg with several cc's of plain xylocaine. Anesthesia was provided by ethyl chloride and a 20-gauge needle was used to inject the knee area. The injection was tolerated well.  A band aid dressing was applied.  The patient was advised to apply ice later today and tomorrow to the injection sight as needed.  See as needed. I have reviewed the [place] web site prior to prescribing narcotic medicine for this patient.   Electronically Signed [name], MD 3/30/20219:52 AM

## 2019-09-10 ENCOUNTER — Encounter: Payer: Medicaid Other | Admitting: Family Medicine

## 2019-09-10 ENCOUNTER — Encounter: Payer: Self-pay | Admitting: Family Medicine

## 2019-09-11 NOTE — Progress Notes (Signed)
This encounter was created in error - please disregard.

## 2019-09-23 ENCOUNTER — Telehealth: Payer: Self-pay

## 2019-09-23 NOTE — Telephone Encounter (Signed)
Changed appt from 4-13 to the following week, we need to send a referral to dr Luna Glasgow for the knot on her finger on the left hand ring finger ---please advise --I also told the pt we would call her

## 2019-09-24 NOTE — Telephone Encounter (Signed)
This referral has been entered

## 2019-09-25 ENCOUNTER — Ambulatory Visit: Payer: Medicaid Other | Admitting: Family Medicine

## 2019-09-30 ENCOUNTER — Other Ambulatory Visit: Payer: Self-pay

## 2019-09-30 ENCOUNTER — Encounter: Payer: Self-pay | Admitting: Orthopaedic Surgery

## 2019-09-30 ENCOUNTER — Ambulatory Visit (INDEPENDENT_AMBULATORY_CARE_PROVIDER_SITE_OTHER): Payer: Medicaid Other | Admitting: Orthopaedic Surgery

## 2019-09-30 VITALS — BP 111/85 | HR 73 | Temp 96.6°F | Wt 115.0 lb

## 2019-09-30 DIAGNOSIS — L989 Disorder of the skin and subcutaneous tissue, unspecified: Secondary | ICD-10-CM | POA: Diagnosis not present

## 2019-09-30 NOTE — Progress Notes (Signed)
Patient [id number] Judy Cross, female DOB:01-28-1966, 54 y.o. [id number]  Chief Complaint  Patient presents with  . Hand Pain    left fourth finger, knot at PIP, been there a while and is now hurting  . Knee Pain    knee is about same as always, had inj 09/09/19    HPI  Judy Cross is a 54 y.o. female who has developed lesion on the left ring finger just distal to the PIP joint on the ulnar side of the finger.  It is the size of a large pea.  She has no redness or trauma.  It bothers her now.  She has no numbness, no redness, no drainage.    Body mass index is 21.03 kg/m.  ROS  Review of Systems  HENT: Negative for congestion.   Respiratory: Positive for shortness of breath. Negative for cough.   Cardiovascular: Negative for chest pain and leg swelling.  Endocrine: Negative for cold intolerance.  Musculoskeletal: Positive for arthralgias and gait problem.  Allergic/Immunologic: Negative for environmental allergies.  All other systems reviewed and are negative.   All other systems reviewed and are negative.  The following is a summary of the past history medically, past history surgically, known current medicines, social history and family history.  This information is gathered electronically by the computer from prior information and documentation.  I review this each visit and have found including this information at this point in the chart is beneficial and informative.    Past Medical History:  Diagnosis Date  . Allergy   . Anxiety 06/12/2019  . Arthritis   . Asthma in adult, mild intermittent, uncomplicated Q000111Q  . Breast nodule 10/12/2015  . Breast nodule 10/12/2015  . 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 Endometrial polyp 04/28/2013  . Enlarged uterus 03/26/2013  . Fibroids 04/28/2013  . Hot flashes 03/26/2013  . Hot flashes 03/26/2013  . Hypertension   . Irregular bleeding 03/26/2013  . Neuromuscular disorder (HCC)    sciatica  . Seasonal allergies    . Seizures ([place])    last seizure was 2 years ago; unknown etiology. On Keppra.    Past Surgical History:  Procedure Laterality Date  . ABLATION     with polyp removal.  . CESAREAN SECTION     x 3  . COLONOSCOPY WITH PROPOFOL N/A 01/11/2016   Procedure: COLONOSCOPY WITH PROPOFOL;  Surgeon: Danie Binder, MD;  [facility];  Service: Endoscopy;  Laterality: N/A;  800  . EXAMINATION UNDER ANESTHESIA  02/28/2012   Procedure: EXAM UNDER ANESTHESIA;  Surgeon: Donato Heinz, MD;  Location: [facility];  Service: General;  Laterality: N/A;  . GANGLION CYST EXCISION Left   . HYSTEROSCOPY WITH D & C N/A 05/23/2013   Procedure: DILATATION AND CURETTAGE /HYSTEROSCOPY;  Surgeon: Jonnie Kind, MD;  Location: [facility];  Service: Gynecology;  Laterality: N/A;  . POLYPECTOMY N/A 05/23/2013   Procedure: ENDOMETRIAL POLYP REMOVAL;  Surgeon: Jonnie Kind, MD;  Location: [facility];  Service: Gynecology;  Laterality: N/A;  . SPHINCTEROTOMY  02/28/2012   Procedure: SPHINCTEROTOMY;  Surgeon: Donato Heinz, MD;  Location: [facility];  Service: General;  Laterality: N/A;  Lateral Internal Sphincterotomy  . TRIGGER FINGER RELEASE Left    ring finger  . TUBAL LIGATION      Family History  Problem Relation Age of Onset  . Hypertension Mother   . Arthritis Mother   . COPD Mother   . Diabetes  Mother   . Heart disease Mother   . Hypertension Father   . Parkinson's disease Father   . Colon cancer Neg Hx     Social History Social History   Tobacco Use  . Smoking status: Light Tobacco Smoker    Packs/day: 0.25    Years: 20.00    Pack years: 5.00    Types: Cigarettes  . Smokeless tobacco: Never Used  . Tobacco comment: smokes 1 cig daily  Substance Use Topics  . Alcohol use: Yes    Comment: occasional  . Drug use: No    Allergies  Allergen Reactions  . Lisinopril Swelling    swelling to entire mouth.  . Penicillins Rash    Has patient had a PCN reaction causing immediate rash,  facial/tongue/throat swelling, SOB or lightheadedness with hypotension: no - next day Has patient had a PCN reaction causing severe rash involving mucus membranes or skin necrosis: No Has patient had a PCN reaction that required hospitalization No Has patient had a PCN reaction occurring within the last 10 years: Yes If all of the above answers are "NO", then may proceed with Cephalosporin use.   . Tramadol Other (See Comments)    dizziness    Current Outpatient Medications  Medication Sig Dispense Refill  . albuterol (VENTOLIN HFA) 108 (90 Base) MCG/ACT inhaler Inhale 1-2 puffs into the lungs every 6 (six) hours as needed for wheezing or shortness of breath. 18 g 0  . cyclobenzaprine (FLEXERIL) 10 MG tablet Take 1 tablet (10 mg total) by mouth at bedtime. 30 tablet 0  . hydrochlorothiazide (HYDRODIURIL) 25 MG tablet TAKE 1 TABLET BY MOUTH EVERY DAY 90 tablet 2  . HYDROcodone-acetaminophen (NORCO/VICODIN) 5-325 MG tablet One tablet every six hours as needed for pain.  Must last 28 days. 56 tablet 0  . hydrOXYzine (ATARAX/VISTARIL) 25 MG tablet Take 1 tablet (25 mg total) by mouth every 6 (six) hours as needed for anxiety. 12 tablet 0  . naproxen (NAPROSYN) 500 MG tablet Take 1 tablet (500 mg total) by mouth 2 (two) times daily with a meal. 20 tablet 0   No current facility-administered medications for this visit.     Physical Exam  Blood pressure 111/85, pulse 73, temperature (!) 96.6 F (35.9 C), weight 115 lb (52.2 kg).  Constitutional: overall normal hygiene, normal nutrition, well developed, normal grooming, normal body habitus. Assistive device:none  Musculoskeletal: gait and station Limp left, muscle tone and strength are normal, no tremors or atrophy is present.  .  Neurological: coordination overall normal.  Deep tendon reflex/nerve stretch intact.  Sensation normal.  Cranial nerves II-XII intact.   Skin:   Normal overall no scars, lesions, ulcers or rashes. No  psoriasis.  Psychiatric: Alert and oriented x 3.  Recent memory intact, remote memory unclear.  Normal mood and affect. Well groomed.  Good eye contact.  Cardiovascular: overall no swelling, no varicosities, no edema bilaterally, normal temperatures of the legs and arms, no clubbing, cyanosis and good capillary refill.  Lymphatic: palpation is normal.  The left nondominant hand, the ring finger has a lesion, firm, non-moveable, about size of a large English pea, on the ulnar side of the finger just distal to the PIP joint and not involving the joint. ROM of the finger is full.  NV intact.  All other systems reviewed and are negative   The patient has been educated about the nature of the problem(s) and counseled on treatment options.  The patient appeared to understand what  I have discussed and is in agreement with it.  Encounter Diagnosis  Name Primary?  . Lesion of finger Yes   Procedure note:  After permission from the patient, sterile prep was done of the left ring finger.  I attempted aspiration by sterile technique.  I was unable to aspirate any fluid. Thus, the lesion is solid.  I will have her see Dr. Aline Brochure for surgical removal as outpatient  She is agreeable.  PLAN Call if any problems.  Precautions discussed.  Continue current medications.   Return to clinic to see Dr. Aline Brochure for possible finger surgery.   Electronically Signed [name], MD 4/20/20218:20 AM

## 2019-10-01 ENCOUNTER — Other Ambulatory Visit: Payer: Self-pay | Admitting: Family Medicine

## 2019-10-01 ENCOUNTER — Ambulatory Visit: Payer: Medicaid Other | Admitting: Family Medicine

## 2019-10-01 DIAGNOSIS — H52223 Regular astigmatism, bilateral: Secondary | ICD-10-CM | POA: Diagnosis not present

## 2019-10-01 DIAGNOSIS — Z1231 Encounter for screening mammogram for malignant neoplasm of breast: Secondary | ICD-10-CM

## 2019-10-01 DIAGNOSIS — H524 Presbyopia: Secondary | ICD-10-CM | POA: Diagnosis not present

## 2019-10-02 ENCOUNTER — Ambulatory Visit: Payer: Medicaid Other

## 2019-10-02 ENCOUNTER — Other Ambulatory Visit (HOSPITAL_COMMUNITY): Payer: Self-pay | Admitting: Family Medicine

## 2019-10-02 ENCOUNTER — Other Ambulatory Visit: Payer: Self-pay

## 2019-10-02 ENCOUNTER — Ambulatory Visit (HOSPITAL_COMMUNITY)
Admission: RE | Admit: 2019-10-02 | Discharge: 2019-10-02 | Disposition: A | Discharge status: Home / Self Care | Payer: Medicaid Other | Source: Ambulatory Visit | Attending: Family Medicine | Admitting: Family Medicine

## 2019-10-02 DIAGNOSIS — Z1231 Encounter for screening mammogram for malignant neoplasm of breast: Secondary | ICD-10-CM

## 2019-10-05 NOTE — Progress Notes (Signed)
Judy Cross  Chief Complaint  Patient presents with  . Hand Problem    knot on left ring finger surgical consult     54 year old female followed by Dr. Luna Glasgow for a mass on the left ring finger.  It was aspirated but no fluid came out.  Finger rubs up against the small finger  The patient would like the mass excised     Review of Systems  Constitutional: Negative for chills, fever, malaise/fatigue and weight loss.  Neurological: Negative for tingling.     Past Medical History:  Diagnosis Date  . Allergy   . Anxiety 06/12/2019  . Arthritis   . Asthma in adult, mild intermittent, uncomplicated Q000111Q  . Breast nodule 10/12/2015  . Breast nodule 10/12/2015  . 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 Endometrial polyp 04/28/2013  . Enlarged uterus 03/26/2013  . Fibroids 04/28/2013  . Hot flashes 03/26/2013  . Hot flashes 03/26/2013  . Hypertension   . Irregular bleeding 03/26/2013  . Neuromuscular disorder (HCC)    sciatica  . Seasonal allergies   . Seizures ([place])    last seizure was 2 years ago; unknown etiology. On Keppra.    Past Surgical History:  Procedure Laterality Date  . ABLATION     with polyp removal.  . CESAREAN SECTION     x 3  . COLONOSCOPY WITH PROPOFOL N/A 01/11/2016   Procedure: COLONOSCOPY WITH PROPOFOL;  Surgeon: Danie Binder, MD;  [facility];  Service: Endoscopy;  Laterality: N/A;  800  . EXAMINATION UNDER ANESTHESIA  02/28/2012   Procedure: EXAM UNDER ANESTHESIA;  Surgeon: Donato Heinz, MD;  Location: [facility];  Service: General;  Laterality: N/A;  . GANGLION CYST EXCISION Left   . HYSTEROSCOPY WITH D & C N/A 05/23/2013   Procedure: DILATATION AND CURETTAGE /HYSTEROSCOPY;  Surgeon: Jonnie Kind, MD;  Location: [facility];  Service: Gynecology;  Laterality: N/A;  . POLYPECTOMY N/A 05/23/2013   Procedure: ENDOMETRIAL POLYP REMOVAL;  Surgeon: Jonnie Kind, MD;  Location[facility];  Service: Gynecology;  Laterality: N/A;  .  SPHINCTEROTOMY  02/28/2012   Procedure: SPHINCTEROTOMY;  Surgeon: Donato Heinz, MD;  Location: [facility];  Service: General;  Laterality: N/A;  Lateral Internal Sphincterotomy  . TRIGGER FINGER RELEASE Left    ring finger  . TUBAL LIGATION      Family History  Problem Relation Age of Onset  . Hypertension Mother   . Arthritis Mother   . COPD Mother   . Diabetes Mother   . Heart disease Mother   . Hypertension Father   . Parkinson's disease Father   . Colon cancer Neg Hx    Social History   Tobacco Use  . Smoking status: Light Tobacco Smoker    Packs/day: 0.25    Years: 20.00    Pack years: 5.00    Types: Cigarettes  . Smokeless tobacco: Never Used  . Tobacco comment: smokes 1 cig daily  Substance Use Topics  . Alcohol use: Yes    Comment: occasional  . Drug use: No    Allergies  Allergen Reactions  . Lisinopril Swelling    swelling to entire mouth.  . Penicillins Rash    Has patient had a PCN reaction causing immediate rash, facial/tongue/throat swelling, SOB or lightheadedness with hypotension: no - next day Has patient had a PCN reaction causing severe rash involving mucus membranes or skin necrosis: No Has patient had a PCN reaction that required hospitalization  No Has patient had a PCN reaction occurring within the last 10 years: Yes If all of the above answers are "NO", then may proceed with Cephalosporin use.   . Tramadol Other (See Comments)    dizziness     Current Meds  Medication Sig  . albuterol (VENTOLIN HFA) 108 (90 Base) MCG/ACT inhaler Inhale 1-2 puffs into the lungs every 6 (six) hours as needed for wheezing or shortness of breath.  . cyclobenzaprine (FLEXERIL) 10 MG tablet Take 1 tablet (10 mg total) by mouth at bedtime.  . hydrochlorothiazide (HYDRODIURIL) 25 MG tablet TAKE 1 TABLET BY MOUTH EVERY DAY  . HYDROcodone-acetaminophen (NORCO/VICODIN) 5-325 MG tablet One tablet every six hours as needed for pain.  Must last 28 days.  . hydrOXYzine  (ATARAX/VISTARIL) 25 MG tablet Take 1 tablet (25 mg total) by mouth every 6 (six) hours as needed for anxiety.  . naproxen (NAPROSYN) 500 MG tablet Take 1 tablet (500 mg total) by mouth 2 (two) times daily with a meal.  . [DISCONTINUED] HYDROcodone-acetaminophen (NORCO/VICODIN) 5-325 MG tablet One tablet every six hours as needed for pain.  Must last 28 days.    BP 120/84   Pulse 93   Ht 5' 1.5" (1.562 m)   Wt 113 lb (51.3 kg)   BMI 21.01 kg/m   Physical Exam Vitals and nursing note reviewed.  Constitutional:      Appearance: Normal appearance.  Neurological:     Mental Status: She is alert and oriented to person, place, and time.  Psychiatric:        Mood and Affect: Mood normal.     Ortho Exam  Left ring finger mass abutting against the small finger feels firm 1 x 1 cm appears mobile under the tissue Nontender to palpation No lymphadenopathy in the left side Normal flexion extension and digit tendon function No neurovascular deficits no color changes   MEDICAL DECISION SECTION  xrays ordered? no My independent reading of xrays: no  Encounter Diagnosis  Name Primary?  . Lesion of finger, left ring finger Yes     PLAN:   Preop assessments:   Risk of surgery: Bleeding infection risk of amputation numbness tingling after surgery    Surgical procedure planned: Excision of mass left ring finger  The procedure has been fully reviewed with the patient; The risks and benefits of surgery have been discussed and explained and understood. Alternative treatment has also been reviewed, questions were encouraged and answered. The postoperative plan is also been reviewed.  Nonsurgical treatment as described in the history and physical section was attempted and unsuccessful and the patient has agreed to proceed with surgical intervention to improve their situation.  Addendum: Patient is also being seen by Dr. Luna Glasgow for chronic knee pain and is on Vicodin/Norco, she wanted  her medication refilled and I went ahead and refilled it and send Dr. Luna Glasgow a message regarding that.  Meds ordered this encounter  Medications  . HYDROcodone-acetaminophen (NORCO/VICODIN) 5-325 MG tablet    Sig: One tablet every six hours as needed for pain.  Must last 28 days.    Dispense:  56 tablet    Refill:  0    Arther Abbott, MD 10/07/2019 3:45 PM

## 2019-10-07 ENCOUNTER — Encounter: Payer: Self-pay | Admitting: Orthopedic Surgery

## 2019-10-07 ENCOUNTER — Other Ambulatory Visit: Payer: Self-pay

## 2019-10-07 ENCOUNTER — Ambulatory Visit (INDEPENDENT_AMBULATORY_CARE_PROVIDER_SITE_OTHER): Payer: Medicaid Other | Admitting: Orthopedic Surgery

## 2019-10-07 VITALS — BP 120/84 | HR 93 | Ht 61.5 in | Wt 113.0 lb

## 2019-10-07 DIAGNOSIS — L989 Disorder of the skin and subcutaneous tissue, unspecified: Secondary | ICD-10-CM

## 2019-10-07 DIAGNOSIS — G8929 Other chronic pain: Secondary | ICD-10-CM | POA: Diagnosis not present

## 2019-10-07 DIAGNOSIS — M25562 Pain in left knee: Secondary | ICD-10-CM | POA: Diagnosis not present

## 2019-10-07 MED ORDER — HYDROCODONE-ACETAMINOPHEN 5-325 MG PO TABS: ORAL_TABLET | ORAL | 0 refills | Status: DC

## 2019-10-07 NOTE — Addendum Note (Signed)
Addended byCandice Camp on: 10/07/2019 04:17 PM   Modules accepted: Orders, SmartSet

## 2019-10-07 NOTE — Patient Instructions (Signed)
Surgery scheduled June 3 rd

## 2019-10-21 ENCOUNTER — Ambulatory Visit (INDEPENDENT_AMBULATORY_CARE_PROVIDER_SITE_OTHER): Payer: Medicaid Other | Admitting: Orthopaedic Surgery

## 2019-10-21 ENCOUNTER — Other Ambulatory Visit: Payer: Self-pay

## 2019-10-21 ENCOUNTER — Encounter: Payer: Self-pay | Admitting: Orthopaedic Surgery

## 2019-10-21 ENCOUNTER — Ambulatory Visit: Payer: Medicaid Other

## 2019-10-21 VITALS — BP 134/91 | HR 82 | Ht 61.5 in | Wt 113.0 lb

## 2019-10-21 DIAGNOSIS — F17210 Nicotine dependence, cigarettes, uncomplicated: Secondary | ICD-10-CM | POA: Diagnosis not present

## 2019-10-21 DIAGNOSIS — G8929 Other chronic pain: Secondary | ICD-10-CM | POA: Diagnosis not present

## 2019-10-21 DIAGNOSIS — M25562 Pain in left knee: Secondary | ICD-10-CM

## 2019-10-21 NOTE — Progress Notes (Signed)
Patient [id number] Judy Cross, female DOB:[birth year], 54 y.o. [id number]  Chief Complaint  Patient presents with  . Knee Pain    left/ getting worse     HPI  Kerensa BEEBE COUNCE is a 54 y.o. female who is having increasing pain in the left knee.  She has swelling, popping and giving way.  She says it is getting worse.     Body mass index is 21.01 kg/m.  ROS  Review of Systems  HENT: Negative for congestion.   Respiratory: Positive for shortness of breath. Negative for cough.   Cardiovascular: Negative for chest pain and leg swelling.  Endocrine: Negative for cold intolerance.  Musculoskeletal: Positive for arthralgias and gait problem.  Allergic/Immunologic: Negative for environmental allergies.  All other systems reviewed and are negative.   All other systems reviewed and are negative.  The following is a summary of the past history medically, past history surgically, known current medicines, social history and family history.  This information is gathered electronically by the computer from prior information and documentation.  I review this each visit and have found including this information at this point in the chart is beneficial and informative.    Past Medical History:  Diagnosis Date  . Allergy   . Anxiety 06/12/2019  . Arthritis   . Asthma in adult, mild intermittent, uncomplicated Q000111Q  . Breast nodule 10/12/2015  . Breast nodule 10/12/2015  . 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 Endometrial polyp 04/28/2013  . Enlarged uterus 03/26/2013  . Fibroids 04/28/2013  . Hot flashes 03/26/2013  . Hot flashes 03/26/2013  . Hypertension   . Irregular bleeding 03/26/2013  . Neuromuscular disorder (HCC)    sciatica  . Seasonal allergies   . Seizures ([place])    last seizure was 2 years ago; unknown etiology. On Keppra.    Past Surgical History:  Procedure Laterality Date  . ABLATION     with polyp removal.  . CESAREAN SECTION     x 3  . COLONOSCOPY WITH  PROPOFOL N/A 01/11/2016   Procedure: COLONOSCOPY WITH PROPOFOL;  Surgeon: Danie Binder, MD;  [facility];  Service: Endoscopy;  Laterality: N/A;  800  . EXAMINATION UNDER ANESTHESIA  02/28/2012   Procedure: EXAM UNDER ANESTHESIA;  Surgeon: Donato Heinz, MD;  Location: [facility];  Service: General;  Laterality: N/A;  . GANGLION CYST EXCISION Left   . HYSTEROSCOPY WITH D & C N/A 05/23/2013   Procedure: DILATATION AND CURETTAGE /HYSTEROSCOPY;  Surgeon: Jonnie Kind, MD;  Location: [facility];  Service: Gynecology;  Laterality: N/A;  . POLYPECTOMY N/A 05/23/2013   Procedure: ENDOMETRIAL POLYP REMOVAL;  Surgeon: Jonnie Kind, MD;  Location: [facility];  Service: Gynecology;  Laterality: N/A;  . SPHINCTEROTOMY  02/28/2012   Procedure: SPHINCTEROTOMY;  Surgeon: Donato Heinz, MD;  Location: [facility];  Service: General;  Laterality: N/A;  Lateral Internal Sphincterotomy  . TRIGGER FINGER RELEASE Left    ring finger  . TUBAL LIGATION      Family History  Problem Relation Age of Onset  . Hypertension Mother   . Arthritis Mother   . COPD Mother   . Diabetes Mother   . Heart disease Mother   . Hypertension Father   . Parkinson's disease Father   . Colon cancer Neg Hx     Social History Social History   Tobacco Use  . Smoking status: Light Tobacco Smoker    Packs/day: 0.25    Years:  20.00    Pack years: 5.00    Types: Cigarettes  . Smokeless tobacco: Never Used  . Tobacco comment: smokes 1 cig daily  Substance Use Topics  . Alcohol use: Yes    Comment: occasional  . Drug use: No    Allergies  Allergen Reactions  . Lisinopril Swelling    swelling to entire mouth.  . Penicillins Rash    Has patient had a PCN reaction causing immediate rash, facial/tongue/throat swelling, SOB or lightheadedness with hypotension: no - next day Has patient had a PCN reaction causing severe rash involving mucus membranes or skin necrosis: No Has patient had a PCN reaction that required  hospitalization No Has patient had a PCN reaction occurring within the last 10 years: Yes If all of the above answers are "NO", then may proceed with Cephalosporin use.   . Tramadol Other (See Comments)    dizziness    Current Outpatient Medications  Medication Sig Dispense Refill  . albuterol (VENTOLIN HFA) 108 (90 Base) MCG/ACT inhaler Inhale 1-2 puffs into the lungs every 6 (six) hours as needed for wheezing or shortness of breath. 18 g 0  . cyclobenzaprine (FLEXERIL) 10 MG tablet Take 1 tablet (10 mg total) by mouth at bedtime. 30 tablet 0  . hydrochlorothiazide (HYDRODIURIL) 25 MG tablet TAKE 1 TABLET BY MOUTH EVERY DAY 90 tablet 2  . HYDROcodone-acetaminophen (NORCO/VICODIN) 5-325 MG tablet One tablet every six hours as needed for pain.  Must last 28 days. 56 tablet 0  . hydrOXYzine (ATARAX/VISTARIL) 25 MG tablet Take 1 tablet (25 mg total) by mouth every 6 (six) hours as needed for anxiety. 12 tablet 0  . naproxen (NAPROSYN) 500 MG tablet Take 1 tablet (500 mg total) by mouth 2 (two) times daily with a meal. 20 tablet 0   No current facility-administered medications for this visit.     Physical Exam  Blood pressure (!) 134/91, pulse 82, height 5' 1.5" (1.562 m), weight 113 lb (51.3 kg).  Constitutional: overall normal hygiene, normal nutrition, well developed, normal grooming, normal body habitus. Assistive device:none  Musculoskeletal: gait and station Limp left, muscle tone and strength are normal, no tremors or atrophy is present.  .  Neurological: coordination overall normal.  Deep tendon reflex/nerve stretch intact.  Sensation normal.  Cranial nerves II-XII intact.   Skin:   Normal overall no scars, lesions, ulcers or rashes. No psoriasis.  Psychiatric: Alert and oriented x 3.  Recent memory intact, remote memory unclear.  Normal mood and affect. Well groomed.  Good eye contact.  Cardiovascular: overall no swelling, no varicosities, no edema bilaterally, normal  temperatures of the legs and arms, no clubbing, cyanosis and good capillary refill.  Lymphatic: palpation is normal.  Left knee is tender medially, there is very slight effusion, crepitus, limp left, NV intact.  There is a weakly positive medial [place].  All other systems reviewed and are negative   The patient has been educated about the nature of the problem(s) and counseled on treatment options.  The patient appeared to understand what I have discussed and is in agreement with it.  Encounter Diagnoses  Name Primary?  . Chronic pain of left knee Yes  . Cigarette nicotine dependence without complication     PLAN Call if any problems.  Precautions discussed.  Continue current medications.   Return to clinic 2 weeks   Get MRI of the left knee.  Electronically Signed [name], MD 5/11/20218:34 AM

## 2019-11-04 ENCOUNTER — Ambulatory Visit: Payer: Medicaid Other | Admitting: Orthopaedic Surgery

## 2019-11-04 NOTE — Patient Instructions (Signed)
Judy Cross  11/04/2019     @PREFPERIOPPHARMACY @   Your procedure is scheduled on  11/13/2019 .  Report to [facility] at  [phone]  A.M.  Call this number if you have problems the morning of surgery:  [phone]   Remember:  Do not eat or drink after midnight.                     Take these medicines the morning of surgery with A SIP OF WATER  Hydrocodone (if needed). Use your inhaler before you come.    Do not wear jewelry, make-up or nail polish.  Do not wear lotions, powders, or perfumes, or deodorant. Please brush your teeth.  Do not shave 48 hours prior to surgery.  Men may shave face and neck.  Do not bring valuables to the hospital.  [facility] is not responsible for any belongings or valuables.  Contacts, dentures or bridgework may not be worn into surgery.  Leave your suitcase in the car.  After surgery it may be brought to your room.  For patients admitted to the hospital, discharge time will be determined by your treatment team.  Patients discharged the day of surgery will not be allowed to drive home.   Name and phone number of your driver:   family Special instructions:  DO NOT smoke the morning of your procedure.  Please read over the following fact sheets that you were given. Anesthesia Post-op Instructions and Care and Recovery After Surgery       Incision Care, Adult An incision is a cut that a doctor makes in your skin for surgery (for a procedure). Most times, these cuts are closed after surgery. Your cut from surgery may be closed with stitches (sutures), staples, skin glue, or skin tape (adhesive strips). You may need to return to your doctor to have stitches or staples taken out. This may happen many days or many weeks after your surgery. The cut needs to be well cared for so it does not get infected. How to care for your cut Cut care   Follow instructions from your doctor about how to take care of your cut. Make sure you: ? Wash your  hands with soap and water before you change your bandage (dressing). If you cannot use soap and water, use hand sanitizer. ? Change your bandage as told by your doctor. ? Leave stitches, skin glue, or skin tape in place. They may need to stay in place for 2 weeks or longer. If tape strips get loose and curl up, you may trim the loose edges. Do not remove tape strips completely unless your doctor says it is okay.  Check your cut area every day for signs of infection. Check for: ? More redness, swelling, or pain. ? More fluid or blood. ? Warmth. ? Pus or a bad smell.  Ask your doctor how to clean the cut. This may include: ? Using mild soap and water. ? Using a clean towel to pat the cut dry after you clean it. ? Putting a cream or ointment on the cut. Do this only as told by your doctor. ? Covering the cut with a clean bandage.  Ask your doctor when you can leave the cut uncovered.  Do not take baths, swim, or use a hot tub until your doctor says it is okay. Ask your doctor if you can take showers. You may only be allowed to  take sponge baths for bathing. Medicines  If you were prescribed an antibiotic medicine, cream, or ointment, take the antibiotic or put it on the cut as told by your doctor. Do not stop taking or putting on the antibiotic even if your condition gets better.  Take over-the-counter and prescription medicines only as told by your doctor. General instructions  Limit movement around your cut. This helps healing. ? Avoid straining, lifting, or exercise for the first month, or for as long as told by your doctor. ? Follow instructions from your doctor about going back to your normal activities. ? Ask your doctor what activities are safe.  Protect your cut from the sun when you are outside for the first 6 months, or for as long as told by your doctor. Put on sunscreen around the scar or cover up the scar.  Keep all follow-up visits as told by your doctor. This is  important. Contact a doctor if:  Your have more redness, swelling, or pain around the cut.  You have more fluid or blood coming from the cut.  Your cut feels warm to the touch.  You have pus or a bad smell coming from the cut.  You have a fever or shaking chills.  You feel sick to your stomach (nauseous) or you throw up (vomit).  You are dizzy.  Your stitches or staples come undone. Get help right away if:  You have a red streak coming from your cut.  Your cut bleeds through the bandage and the bleeding does not stop with gentle pressure.  The edges of your cut open up and separate.  You have very bad (severe) pain.  You have a rash.  You are confused.  You pass out (faint).  You have trouble breathing and you have a fast heartbeat. This information is not intended to replace advice given to you by your health care provider. Make sure you discuss any questions you have with your health care provider. Document Revised: 10/16/2016 Document Reviewed: 02/04/2016 Elsevier Patient Education  2020 [place] Anesthesia, Adult, Care After This sheet gives you information about how to care for yourself after your procedure. Your health care provider may also give you more specific instructions. If you have problems or questions, contact your health care provider. What can I expect after the procedure? After the procedure, the following side effects are common:  Pain or discomfort at the IV site.  Nausea.  Vomiting.  Sore throat.  Trouble concentrating.  Feeling cold or chills.  Weak or tired.  Sleepiness and fatigue.  Soreness and body aches. These side effects can affect parts of the body that were not involved in surgery. Follow these instructions at home:  For at least 24 hours after the procedure:  Have a responsible adult stay with you. It is important to have someone help care for you until you are awake and alert.  Rest as needed.  Do  not: ? Participate in activities in which you could fall or become injured. ? Drive. ? Use heavy machinery. ? Drink alcohol. ? Take sleeping pills or medicines that cause drowsiness. ? Make important decisions or sign legal documents. ? Take care of children on your own. Eating and drinking  Follow any instructions from your health care provider about eating or drinking restrictions.  When you feel hungry, start by eating small amounts of foods that are soft and easy to digest (bland), such as toast. Gradually return to your regular diet.  Drink enough fluid to keep your urine pale yellow.  If you vomit, rehydrate by drinking water, juice, or clear broth. General instructions  If you have sleep apnea, surgery and certain medicines can increase your risk for breathing problems. Follow instructions from your health care provider about wearing your sleep device: ? Anytime you are sleeping, including during daytime naps. ? While taking prescription pain medicines, sleeping medicines, or medicines that make you drowsy.  Return to your normal activities as told by your health care provider. Ask your health care provider what activities are safe for you.  Take over-the-counter and prescription medicines only as told by your health care provider.  If you smoke, do not smoke without supervision.  Keep all follow-up visits as told by your health care provider. This is important. Contact a health care provider if:  You have nausea or vomiting that does not get better with medicine.  You cannot eat or drink without vomiting.  You have pain that does not get better with medicine.  You are unable to pass urine.  You develop a skin rash.  You have a fever.  You have redness around your IV site that gets worse. Get help right away if:  You have difficulty breathing.  You have chest pain.  You have blood in your urine or stool, or you vomit blood. Summary  After the procedure, it  is common to have a sore throat or nausea. It is also common to feel tired.  Have a responsible adult stay with you for the first 24 hours after general anesthesia. It is important to have someone help care for you until you are awake and alert.  When you feel hungry, start by eating small amounts of foods that are soft and easy to digest (bland), such as toast. Gradually return to your regular diet.  Drink enough fluid to keep your urine pale yellow.  Return to your normal activities as told by your health care provider. Ask your health care provider what activities are safe for you. This information is not intended to replace advice given to you by your health care provider. Make sure you discuss any questions you have with your health care provider. Document Revised: 06/01/2017 Document Reviewed: 01/12/2017 Elsevier Patient Education  [place]. How to Use Chlorhexidine for Bathing Chlorhexidine gluconate (CHG) is a germ-killing (antiseptic) solution that is used to clean the skin. It can get rid of the bacteria that normally live on the skin and can keep them away for about 24 hours. To clean your skin with CHG, you may be given:  A CHG solution to use in the shower or as part of a sponge bath.  A prepackaged cloth that contains CHG. Cleaning your skin with CHG may help lower the risk for infection:  While you are staying in the intensive care unit of the hospital.  If you have a vascular access, such as a central line, to provide short-term or long-term access to your veins.  If you have a catheter to drain urine from your bladder.  If you are on a ventilator. A ventilator is a machine that helps you breathe by moving air in and out of your lungs.  After surgery. What are the risks? Risks of using CHG include:  A skin reaction.  Hearing loss, if CHG gets in your ears.  Eye injury, if CHG gets in your eyes and is not rinsed out.  The CHG product catching fire. Make  sure that  you avoid smoking and flames after applying CHG to your skin. Do not use CHG:  If you have a chlorhexidine allergy or have previously reacted to chlorhexidine.  On babies younger than 15 months of age. How to use CHG solution  Use CHG only as told by your health care provider, and follow the instructions on the label.  Use the full amount of CHG as directed. Usually, this is one bottle. During a shower Follow these steps when using CHG solution during a shower (unless your health care provider gives you different instructions): 1. Start the shower. 2. Use your normal soap and shampoo to wash your face and hair. 3. Turn off the shower or move out of the shower stream. 4. Pour the CHG onto a clean washcloth. Do not use any type of brush or rough-edged sponge. 5. Starting at your neck, lather your body down to your toes. Make sure you follow these instructions: ? If you will be having surgery, pay special attention to the part of your body where you will be having surgery. Scrub this area for at least 1 minute. ? Do not use CHG on your head or face. If the solution gets into your ears or eyes, rinse them well with water. ? Avoid your genital area. ? Avoid any areas of skin that have broken skin, cuts, or scrapes. ? Scrub your back and under your arms. Make sure to wash skin folds. 6. Let the lather sit on your skin for 1-2 minutes or as long as told by your health care provider. 7. Thoroughly rinse your entire body in the shower. Make sure that all body creases and crevices are rinsed well. 8. Dry off with a clean towel. Do not put any substances on your body afterward-such as powder, lotion, or perfume-unless you are told to do so by your health care provider. Only use lotions that are recommended by the manufacturer. 9. Put on clean clothes or pajamas. 10. If it is the night before your surgery, sleep in clean sheets.  During a sponge bath Follow these steps when using CHG  solution during a sponge bath (unless your health care provider gives you different instructions): 1. Use your normal soap and shampoo to wash your face and hair. 2. Pour the CHG onto a clean washcloth. 3. Starting at your neck, lather your body down to your toes. Make sure you follow these instructions: ? If you will be having surgery, pay special attention to the part of your body where you will be having surgery. Scrub this area for at least 1 minute. ? Do not use CHG on your head or face. If the solution gets into your ears or eyes, rinse them well with water. ? Avoid your genital area. ? Avoid any areas of skin that have broken skin, cuts, or scrapes. ? Scrub your back and under your arms. Make sure to wash skin folds. 4. Let the lather sit on your skin for 1-2 minutes or as long as told by your health care provider. 5. Using a different clean, wet washcloth, thoroughly rinse your entire body. Make sure that all body creases and crevices are rinsed well. 6. Dry off with a clean towel. Do not put any substances on your body afterward-such as powder, lotion, or perfume-unless you are told to do so by your health care provider. Only use lotions that are recommended by the manufacturer. 7. Put on clean clothes or pajamas. 8. If it is the night before your  surgery, sleep in clean sheets. How to use CHG prepackaged cloths  Only use CHG cloths as told by your health care provider, and follow the instructions on the label.  Use the CHG cloth on clean, dry skin.  Do not use the CHG cloth on your head or face unless your health care provider tells you to.  When washing with the CHG cloth: ? Avoid your genital area. ? Avoid any areas of skin that have broken skin, cuts, or scrapes. Before surgery Follow these steps when using a CHG cloth to clean before surgery (unless your health care provider gives you different instructions): 1. Using the CHG cloth, vigorously scrub the part of your body  where you will be having surgery. Scrub using a back-and-forth motion for 3 minutes. The area on your body should be completely wet with CHG when you are done scrubbing. 2. Do not rinse. Discard the cloth and let the area air-dry. Do not put any substances on the area afterward, such as powder, lotion, or perfume. 3. Put on clean clothes or pajamas. 4. If it is the night before your surgery, sleep in clean sheets.  For general bathing Follow these steps when using CHG cloths for general bathing (unless your health care provider gives you different instructions). 1. Use a separate CHG cloth for each area of your body. Make sure you wash between any folds of skin and between your fingers and toes. Wash your body in the following order, switching to a new cloth after each step: ? The front of your neck, shoulders, and chest. ? Both of your arms, under your arms, and your hands. ? Your stomach and groin area, avoiding the genitals. ? Your right leg and foot. ? Your left leg and foot. ? The back of your neck, your back, and your buttocks. 2. Do not rinse. Discard the cloth and let the area air-dry. Do not put any substances on your body afterward-such as powder, lotion, or perfume-unless you are told to do so by your health care provider. Only use lotions that are recommended by the manufacturer. 3. Put on clean clothes or pajamas. Contact a health care provider if:  Your skin gets irritated after scrubbing.  You have questions about using your solution or cloth. Get help right away if:  Your eyes become very red or swollen.  Your eyes itch badly.  Your skin itches badly and is red or swollen.  Your hearing changes.  You have trouble seeing.  You have swelling or tingling in your mouth or throat.  You have trouble breathing.  You swallow any chlorhexidine. Summary  Chlorhexidine gluconate (CHG) is a germ-killing (antiseptic) solution that is used to clean the skin. Cleaning your  skin with CHG may help to lower your risk for infection.  You may be given CHG to use for bathing. It may be in a bottle or in a prepackaged cloth to use on your skin. Carefully follow your health care provider's instructions and the instructions on the product label.  Do not use CHG if you have a chlorhexidine allergy.  Contact your health care provider if your skin gets irritated after scrubbing. This information is not intended to replace advice given to you by your health care provider. Make sure you discuss any questions you have with your health care provider. Document Revised: 08/15/2018 Document Reviewed: 04/26/2017 Elsevier Patient Education  [place].

## 2019-11-07 ENCOUNTER — Encounter (HOSPITAL_COMMUNITY)
Admission: RE | Admit: 2019-11-07 | Discharge: 2019-11-07 | Disposition: A | Discharge status: Home / Self Care | Payer: Medicaid Other | Source: Ambulatory Visit | Attending: Orthopedic Surgery | Admitting: Orthopedic Surgery

## 2019-11-07 ENCOUNTER — Other Ambulatory Visit: Payer: Self-pay

## 2019-11-07 ENCOUNTER — Other Ambulatory Visit: Payer: Self-pay | Admitting: Orthopedic Surgery

## 2019-11-07 ENCOUNTER — Encounter (HOSPITAL_COMMUNITY): Payer: Self-pay

## 2019-11-07 DIAGNOSIS — G8929 Other chronic pain: Secondary | ICD-10-CM

## 2019-11-07 DIAGNOSIS — Z01818 Encounter for other preprocedural examination: Secondary | ICD-10-CM | POA: Diagnosis not present

## 2019-11-07 DIAGNOSIS — M25562 Pain in left knee: Secondary | ICD-10-CM

## 2019-11-07 LAB — CBC WITH DIFFERENTIAL/PLATELET
Abs Immature Granulocytes: 0.01 10*3/uL (ref 0.00–0.07)
Basophils Absolute: 0 10*3/uL (ref 0.0–0.1)
Basophils Relative: 0 %
Eosinophils Absolute: 0 10*3/uL (ref 0.0–0.5)
Eosinophils Relative: 1 %
HCT: 39.4 % (ref 36.0–46.0)
Hemoglobin: 13.2 g/dL (ref 12.0–15.0)
Immature Granulocytes: 0 %
Lymphocytes Relative: 56 %
Lymphs Abs: 2.5 10*3/uL (ref 0.7–4.0)
MCH: 33.1 pg (ref 26.0–34.0)
MCHC: 33.5 g/dL (ref 30.0–36.0)
MCV: 98.7 fL (ref 80.0–100.0)
Monocytes Absolute: 0.4 10*3/uL (ref 0.1–1.0)
Monocytes Relative: 8 %
Neutro Abs: 1.6 10*3/uL — ABNORMAL LOW (ref 1.7–7.7)
Neutrophils Relative %: 35 %
Platelets: 272 10*3/uL (ref 150–400)
RBC: 3.99 MIL/uL (ref 3.87–5.11)
RDW: 12.5 % (ref 11.5–15.5)
WBC: 4.6 10*3/uL (ref 4.0–10.5)
nRBC: 0 % (ref 0.0–0.2)

## 2019-11-07 LAB — BASIC METABOLIC PANEL
Anion gap: 13 (ref 5–15)
BUN: 15 mg/dL (ref 6–20)
CO2: 24 mmol/L (ref 22–32)
Calcium: 9.1 mg/dL (ref 8.9–10.3)
Chloride: 105 mmol/L (ref 98–111)
Creatinine, Ser: 0.9 mg/dL (ref 0.44–1.00)
GFR calc Af Amer: >60 mL/min (ref >60)
GFR calc non Af Amer: >60 mL/min (ref >60)
Glucose, Bld: 107 mg/dL — ABNORMAL HIGH (ref 70–99)
Potassium: 3.7 mmol/L (ref 3.5–5.1)
Sodium: 142 mmol/L (ref 135–145)

## 2019-11-07 LAB — HCG, SERUM, QUALITATIVE: Preg, Serum: NEGATIVE

## 2019-11-07 NOTE — Telephone Encounter (Signed)
Patient called "from pre-op appointment" today, 11/07/19, for refill (reviewed medication request protocol, by Thursdays, noon): HYDROcodone-acetaminophen (NORCO/VICODIN) 5-325 MG tablet 56 tablet  -[facility], [place]

## 2019-11-11 ENCOUNTER — Other Ambulatory Visit (HOSPITAL_COMMUNITY)
Admission: RE | Admit: 2019-11-11 | Discharge: 2019-11-11 | Disposition: A | Discharge status: Home / Self Care | Payer: Medicaid Other | Source: Ambulatory Visit | Attending: Orthopedic Surgery | Admitting: Orthopedic Surgery

## 2019-11-11 ENCOUNTER — Telehealth: Payer: Self-pay | Admitting: Radiology

## 2019-11-11 ENCOUNTER — Other Ambulatory Visit: Payer: Self-pay

## 2019-11-11 DIAGNOSIS — Z20822 Contact with and (suspected) exposure to covid-19: Secondary | ICD-10-CM | POA: Insufficient documentation

## 2019-11-11 DIAGNOSIS — Z01812 Encounter for preprocedural laboratory examination: Secondary | ICD-10-CM | POA: Diagnosis not present

## 2019-11-11 LAB — SARS CORONAVIRUS 2 (TAT 6-24 HRS): SARS Coronavirus 2: NEGATIVE

## 2019-11-11 MED ORDER — HYDROCODONE-ACETAMINOPHEN 5-325 MG PO TABS: ORAL_TABLET | ORAL | 0 refills | Status: DC

## 2019-11-11 NOTE — Telephone Encounter (Signed)
Medicaid will only cover #20 on the hydrocodone without a prior authorization.   I called to change quantity to #20

## 2019-11-11 NOTE — Telephone Encounter (Signed)
She is asking for hydroocodone She is scheduled to have mass removed from ring finger

## 2019-11-11 NOTE — Telephone Encounter (Signed)
Pharmacy let her pay out of pocket for the prescription and she has gotten full quantity.

## 2019-11-13 ENCOUNTER — Encounter (HOSPITAL_COMMUNITY)
Admission: RE | Disposition: A | Discharge status: Home / Self Care | Payer: Self-pay | Source: Home / Self Care | Attending: Orthopedic Surgery

## 2019-11-13 ENCOUNTER — Other Ambulatory Visit: Payer: Self-pay | Admitting: Family Medicine

## 2019-11-13 ENCOUNTER — Encounter (HOSPITAL_COMMUNITY): Payer: Self-pay | Admitting: Orthopedic Surgery

## 2019-11-13 ENCOUNTER — Ambulatory Visit (HOSPITAL_COMMUNITY): Payer: Medicaid Other | Admitting: Anesthesiology

## 2019-11-13 ENCOUNTER — Ambulatory Visit (HOSPITAL_COMMUNITY)
Admission: RE | Admit: 2019-11-13 | Discharge: 2019-11-13 | Disposition: A | Discharge status: Home / Self Care | Payer: Medicaid Other | Attending: Orthopedic Surgery | Admitting: Orthopedic Surgery

## 2019-11-13 DIAGNOSIS — M25569 Pain in unspecified knee: Secondary | ICD-10-CM | POA: Insufficient documentation

## 2019-11-13 DIAGNOSIS — Z880 Allergy status to penicillin: Secondary | ICD-10-CM | POA: Insufficient documentation

## 2019-11-13 DIAGNOSIS — G8929 Other chronic pain: Secondary | ICD-10-CM | POA: Insufficient documentation

## 2019-11-13 DIAGNOSIS — M1990 Unspecified osteoarthritis, unspecified site: Secondary | ICD-10-CM | POA: Insufficient documentation

## 2019-11-13 DIAGNOSIS — R569 Unspecified convulsions: Secondary | ICD-10-CM | POA: Insufficient documentation

## 2019-11-13 DIAGNOSIS — Z888 Allergy status to other drugs, medicaments and biological substances status: Secondary | ICD-10-CM | POA: Insufficient documentation

## 2019-11-13 DIAGNOSIS — Z791 Long term (current) use of non-steroidal anti-inflammatories (NSAID): Secondary | ICD-10-CM | POA: Insufficient documentation

## 2019-11-13 DIAGNOSIS — Z885 Allergy status to narcotic agent status: Secondary | ICD-10-CM | POA: Insufficient documentation

## 2019-11-13 DIAGNOSIS — R2232 Localized swelling, mass and lump, left upper limb: Secondary | ICD-10-CM

## 2019-11-13 DIAGNOSIS — Z79899 Other long term (current) drug therapy: Secondary | ICD-10-CM | POA: Diagnosis not present

## 2019-11-13 DIAGNOSIS — D481 Neoplasm of uncertain behavior of connective and other soft tissue: Secondary | ICD-10-CM | POA: Diagnosis not present

## 2019-11-13 DIAGNOSIS — F17210 Nicotine dependence, cigarettes, uncomplicated: Secondary | ICD-10-CM | POA: Diagnosis not present

## 2019-11-13 DIAGNOSIS — J4520 Mild intermittent asthma, uncomplicated: Secondary | ICD-10-CM

## 2019-11-13 DIAGNOSIS — I10 Essential (primary) hypertension: Secondary | ICD-10-CM | POA: Insufficient documentation

## 2019-11-13 HISTORY — PX: EXCISION MASS UPPER EXTREMETIES: SHX6704

## 2019-11-13 SURGERY — EXCISION MASS UPPER EXTREMITIES
Anesthesia: General | Site: Ring Finger | Laterality: Left

## 2019-11-13 MED ORDER — PROPOFOL 10 MG/ML IV BOLUS
INTRAVENOUS | Status: AC
  Filled 2019-11-13: qty 40

## 2019-11-13 MED ORDER — PROPOFOL 10 MG/ML IV BOLUS
INTRAVENOUS | Status: DC | PRN
  Administered 2019-11-13: 20 mg via INTRAVENOUS

## 2019-11-13 MED ORDER — MEPERIDINE HCL 50 MG/ML IJ SOLN: 6.2500 mg | INTRAMUSCULAR | Status: DC | PRN

## 2019-11-13 MED ORDER — BUPIVACAINE HCL (PF) 0.5 % IJ SOLN
INTRAMUSCULAR | Status: AC
  Filled 2019-11-13: qty 30

## 2019-11-13 MED ORDER — CHLORHEXIDINE GLUCONATE 0.12 % MT SOLN
OROMUCOSAL | Status: AC
  Filled 2019-11-13: qty 15

## 2019-11-13 MED ORDER — FENTANYL CITRATE (PF) 100 MCG/2ML IJ SOLN
INTRAMUSCULAR | Status: DC | PRN
  Administered 2019-11-13: 50 ug via INTRAVENOUS

## 2019-11-13 MED ORDER — MIDAZOLAM HCL 2 MG/2ML IJ SOLN
INTRAMUSCULAR | Status: AC
  Filled 2019-11-13: qty 2

## 2019-11-13 MED ORDER — MIDAZOLAM HCL 5 MG/5ML IJ SOLN
INTRAMUSCULAR | Status: DC | PRN
  Administered 2019-11-13: 2 mg via INTRAVENOUS

## 2019-11-13 MED ORDER — LIDOCAINE HCL (PF) 1 % IJ SOLN
INTRAMUSCULAR | Status: AC
  Filled 2019-11-13: qty 30

## 2019-11-13 MED ORDER — SODIUM CHLORIDE 0.9 % IR SOLN
Status: DC | PRN
  Administered 2019-11-13: 1000 mL

## 2019-11-13 MED ORDER — PROPOFOL 500 MG/50ML IV EMUL
INTRAVENOUS | Status: DC | PRN
  Administered 2019-11-13: 100 ug/kg/min via INTRAVENOUS

## 2019-11-13 MED ORDER — ORAL CARE MOUTH RINSE: 15.0000 mL | Freq: Once | OROMUCOSAL | Status: AC

## 2019-11-13 MED ORDER — CHLORHEXIDINE GLUCONATE 0.12 % MT SOLN
15.0000 mL | Freq: Once | OROMUCOSAL | Status: AC
  Administered 2019-11-13: 15 mL via OROMUCOSAL

## 2019-11-13 MED ORDER — LACTATED RINGERS IV SOLN
INTRAVENOUS | Status: DC | PRN
Start: 2019-11-13 — End: 2019-11-13

## 2019-11-13 MED ORDER — ONDANSETRON HCL 4 MG/2ML IJ SOLN: 4.0000 mg | Freq: Once | INTRAMUSCULAR | Status: DC | PRN

## 2019-11-13 MED ORDER — VANCOMYCIN HCL IN DEXTROSE 1-5 GM/200ML-% IV SOLN
1000.0000 mg | INTRAVENOUS | Status: AC
  Administered 2019-11-13: 1000 mg via INTRAVENOUS
  Filled 2019-11-13: qty 200

## 2019-11-13 MED ORDER — LACTATED RINGERS IV SOLN
Freq: Once | INTRAVENOUS | Status: AC
  Administered 2019-11-13: 1000 mL via INTRAVENOUS

## 2019-11-13 MED ORDER — FENTANYL CITRATE (PF) 100 MCG/2ML IJ SOLN
INTRAMUSCULAR | Status: AC
  Filled 2019-11-13: qty 2

## 2019-11-13 MED ORDER — FENTANYL CITRATE (PF) 100 MCG/2ML IJ SOLN: 25.0000 ug | INTRAMUSCULAR | Status: DC | PRN

## 2019-11-13 MED ORDER — LIDOCAINE HCL (PF) 1 % IJ SOLN
INTRAMUSCULAR | Status: DC | PRN
  Administered 2019-11-13: 10 mL

## 2019-11-13 SURGICAL SUPPLY — 39 items
BNDG COHESIVE 4X5 TAN STRL (GAUZE/BANDAGES/DRESSINGS) ×2 IMPLANT
BNDG CONFORM 2 STRL LF (GAUZE/BANDAGES/DRESSINGS) ×1 IMPLANT
BNDG GAUZE ELAST 4 BULKY (GAUZE/BANDAGES/DRESSINGS) ×2 IMPLANT
CHLORAPREP W/TINT 26 (MISCELLANEOUS) ×2 IMPLANT
CLOTH BEACON ORANGE TIMEOUT ST (SAFETY) ×2 IMPLANT
COVER LIGHT HANDLE STERIS (MISCELLANEOUS) ×4 IMPLANT
COVER WAND RF STERILE (DRAPES) ×2 IMPLANT
CUFF TOURN SGL QUICK 18X4 (TOURNIQUET CUFF) ×2 IMPLANT
DECANTER SPIKE VIAL GLASS SM (MISCELLANEOUS) ×2 IMPLANT
DRSG XEROFORM 1X8 (GAUZE/BANDAGES/DRESSINGS) ×2 IMPLANT
ELECT NDL TIP 2.8 STRL (NEEDLE) ×1 IMPLANT
ELECT NEEDLE TIP 2.8 STRL (NEEDLE) ×2 IMPLANT
ELECT REM PT RETURN 9FT ADLT (ELECTROSURGICAL) ×2
ELECTRODE REM PT RTRN 9FT ADLT (ELECTROSURGICAL) ×1 IMPLANT
GAUZE SPONGE 4X4 12PLY STRL (GAUZE/BANDAGES/DRESSINGS) ×2 IMPLANT
GLOVE BIO SURGEON STRL SZ7 (GLOVE) ×1 IMPLANT
GLOVE BIOGEL PI IND STRL 7.0 (GLOVE) ×2 IMPLANT
GLOVE BIOGEL PI INDICATOR 7.0 (GLOVE) ×2
GLOVE SKINSENSE NS SZ8.0 LF (GLOVE) ×1
GLOVE SKINSENSE STRL SZ8.0 LF (GLOVE) ×1 IMPLANT
GLOVE SS N UNI LF 8.5 STRL (GLOVE) ×2 IMPLANT
GOWN STRL REUS W/TWL LRG LVL3 (GOWN DISPOSABLE) ×2 IMPLANT
GOWN STRL REUS W/TWL XL LVL3 (GOWN DISPOSABLE) ×2 IMPLANT
INST SET MINOR BONE (KITS) ×1 IMPLANT
KIT TURNOVER KIT A (KITS) ×2 IMPLANT
MANIFOLD NEPTUNE II (INSTRUMENTS) ×2 IMPLANT
NDL HYPO 21X1.5 SAFETY (NEEDLE) ×1 IMPLANT
NEEDLE HYPO 21X1.5 SAFETY (NEEDLE) ×2 IMPLANT
NS IRRIG 1000ML POUR BTL (IV SOLUTION) ×2 IMPLANT
PACK BASIC LIMB (CUSTOM PROCEDURE TRAY) ×2 IMPLANT
PAD ARMBOARD 7.5X6 YLW CONV (MISCELLANEOUS) ×2 IMPLANT
SET BASIN LINEN APH (SET/KITS/TRAYS/PACK) ×2 IMPLANT
SPONGE GAUZE 2X2 8PLY STRL LF (GAUZE/BANDAGES/DRESSINGS) ×1 IMPLANT
STRIP CLOSURE SKIN 1/2X4 (GAUZE/BANDAGES/DRESSINGS) ×2 IMPLANT
SUT ETHILON 3 0 FSL (SUTURE) ×2 IMPLANT
SUT MON AB 2-0 SH 27 (SUTURE) ×2
SUT MON AB 2-0 SH27 (SUTURE) ×1 IMPLANT
SUT PROLENE 3 0 PS 2 (SUTURE) IMPLANT
SYR CONTROL 10ML LL (SYRINGE) ×2 IMPLANT

## 2019-11-13 NOTE — Op Note (Signed)
11/13/2019  8:07 AM  PATIENT:  Judy Cross  54 y.o. female  PRE-OPERATIVE DIAGNOSIS:  mass left ring finger  POST-OPERATIVE DIAGNOSIS:  mass left ring finger  PROCEDURE:  Procedure(s): EXCISION MASS UPPER EXTREMETIES ring finger left (Left)  SURGEON:  Surgeon(s) and Role:    Carole Civil, MD - Primary  Findings at surgery 1-1/2 x 1-1/2 cm globular mass with yellow to rust colored areas appear to be coming from underneath the extensor tendon and perhaps from the PIP joint  No assistance  Anesthesia was provided with propofol and 1% lidocaine digital block  Clean case with specimen as described  Specimen to pathology  No blood loss PDRAINS: none   LOCAL MEDICATIONS USED:  LIDOCAINE   COUNTS:  YES  TOURNIQUET:   Total Tourniquet Time Documented: Upper Arm (Left) - 13 minutes Total: Upper Arm (Left) - 13 minutes   DICTATION: .Viviann Spare Dictation  PLAN OF CARE: Discharge to home after PACU  PATIENT DISPOSITION:  PACU - hemodynamically stable.   Delay start of Pharmacological VTE agent (>24hrs) due to surgical blood loss or risk of bleeding: not applicable   Dictation of surgical procedure  The patient was seen in the preop area the surgical site was confirmed and marked the chart review was completed and the consent was signed.  She was taken to the operating room for IV sedation with propofol followed by sterile prep and drape timeout was completed  Surgical site confirmed as left ring finger mass excision as procedure.  The left ring finger was digitally blocked with 1% lidocaine.  Tourniquet was elevated after exsanguination of the limb a longitudinal incision was then made over the mass the mass was dissected sharply and bluntly preserving the extensor tendon and neurovascular structures.  The mass appeared to be coming from underneath the tendon perhaps from the PIP joint.  The mass was removed in 3 sections with the main section being a 1-1/2 x 1-1/2 cm  globular mass which had yellow and rust colored areas in it.  There were no other signs of infection or mass.  The wound was irrigated and closed with interrupted 3-0 nylon suture  Sterile dressing was applied tourniquet was released and the patient was taken to the recovery room in stable condition

## 2019-11-13 NOTE — Brief Op Note (Signed)
11/13/2019  8:07 AM  PATIENT:  Judy Cross  54 y.o. female  PRE-OPERATIVE DIAGNOSIS:  mass left ring finger  POST-OPERATIVE DIAGNOSIS:  mass left ring finger  PROCEDURE:  Procedure(s): EXCISION MASS UPPER EXTREMETIES ring finger left (Left)  SURGEON:  Surgeon(s) and Role:    Carole Civil, MD - Primary  Findings at surgery 1-1/2 x 1-1/2 cm globular mass with yellow to rust colored areas appear to be coming from underneath the extensor tendon and perhaps from the PIP joint  No assistance  Anesthesia was provided with propofol and 1% lidocaine digital block  Clean case with specimen as described  Specimen to pathology  No blood loss PDRAINS: none   LOCAL MEDICATIONS USED:  LIDOCAINE   COUNTS:  YES  TOURNIQUET:   Total Tourniquet Time Documented: Upper Arm (Left) - 13 minutes Total: Upper Arm (Left) - 13 minutes   DICTATION: .Viviann Spare Dictation  PLAN OF CARE: Discharge to home after PACU  PATIENT DISPOSITION:  PACU - hemodynamically stable.   Delay start of Pharmacological VTE agent (>24hrs) due to surgical blood loss or risk of bleeding: not applicable

## 2019-11-13 NOTE — Brief Op Note (Signed)
11/13/2019  8:05 AM  PATIENT:  Judy Cross  54 y.o. female  PRE-OPERATIVE DIAGNOSIS:  mass left ring finger  POST-OPERATIVE DIAGNOSIS:  mass left ring finger  PROCEDURE:  Procedure(s): EXCISION MASS UPPER EXTREMETIES ring finger left (Left)  SURGEON:  Surgeon(s) and Role:    Carole Civil, MD - Primary  Findings at surgery 1-1/2 x 1-1/2 cm globular mass with yellow to rust colored areas appear to be coming from underneath the extensor tendon and perhaps from the PIP joint  No assistance  Anesthesia was provided with propofol and 1% lidocaine digital block  Clean case with specimen as described  Specimen to pathology  No blood loss PDRAINS: none   LOCAL MEDICATIONS USED:  LIDOCAINE   COUNTS:  YES  TOURNIQUET:   Total Tourniquet Time Documented: Upper Arm (Left) - 13 minutes Total: Upper Arm (Left) - 13 minutes   DICTATION: .Viviann Spare Dictation  PLAN OF CARE: Discharge to home after PACU  PATIENT DISPOSITION:  PACU - hemodynamically stable.   Delay start of Pharmacological VTE agent (>24hrs) due to surgical blood loss or risk of bleeding: not applicable

## 2019-11-13 NOTE — Anesthesia Postprocedure Evaluation (Signed)
Anesthesia Post Note  Patient: Judy Cross  Procedure(s) Performed: EXCISION MASS UPPER EXTREMETIES ring finger left (Left Ring Finger)  Patient location during evaluation: PACU Anesthesia Type: General Level of consciousness: awake and alert Pain management: pain level controlled Vital Signs Assessment: post-procedure vital signs reviewed and stable Respiratory status: spontaneous breathing Cardiovascular status: stable Postop Assessment: no apparent nausea or vomiting Anesthetic complications: no     Last Vitals:  Vitals:   11/13/19 0639  BP: (!) 130/97  Pulse: 76  Resp: 16  Temp: 36.7 C  SpO2: 99%    Last Pain:  Vitals:   11/13/19 0639  TempSrc: Oral  PainSc: 2                  Claudy Abdallah

## 2019-11-13 NOTE — H&P (Signed)
Chief Complaint  Patient presents with  . Hand Problem      knot on left ring finger surgical consult       54 year old female followed by Dr. Luna Glasgow for a mass on the left ring finger.  It was aspirated but no fluid came out.  Finger rubs up against the small finger   The patient would like the mass excised         Review of Systems  Constitutional: Negative for chills, fever, malaise/fatigue and weight loss.  Neurological: Negative for tingling.            Past Medical History:  Diagnosis Date  . Allergy    . Anxiety 06/12/2019  . Arthritis    . Asthma in adult, mild intermittent, uncomplicated [id number]  . Breast nodule 10/12/2015  . Breast nodule 10/12/2015  . Bulge of lumbar disc without myelopathy    . Endometrial polyp 04/28/2013  . Enlarged uterus 03/26/2013  . Fibroids 04/28/2013  . Hot flashes 03/26/2013  . Hot flashes 03/26/2013  . Hypertension    . Irregular bleeding 03/26/2013  . Neuromuscular disorder (HCC)      sciatica  . Seasonal allergies    . 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 On Keppra.           Past Surgical History:  Procedure Laterality Date  . ABLATION        with polyp removal.  . CESAREAN SECTION        x 3  . COLONOSCOPY WITH PROPOFOL N/A 01/11/2016    Procedure: COLONOSCOPY WITH PROPOFOL;  Surgeon: Danie Binder, MD;  [facility];  Service: Endoscopy;  Laterality: N/A;  800  . EXAMINATION UNDER ANESTHESIA   02/28/2012    Procedure: EXAM UNDER ANESTHESIA;  Surgeon: Donato Heinz, MD;  Location: [facility];  Service: General;  Laterality: N/A;  . GANGLION CYST EXCISION Left    . HYSTEROSCOPY WITH D & C N/A 05/23/2013    Procedure: DILATATION AND CURETTAGE /HYSTEROSCOPY;  Surgeon: Jonnie Kind, MD;  Location: [facility];  Service: Gynecology;  Laterality: N/A;  . POLYPECTOMY N/A 05/23/2013    Procedure: ENDOMETRIAL POLYP REMOVAL;  Surgeon: Jonnie Kind, MD;  Location: [facility];  Service:  Gynecology;  Laterality: N/A;  . SPHINCTEROTOMY   02/28/2012    Procedure: SPHINCTEROTOMY;  Surgeon: Donato Heinz, MD;  Location: [facility];  Service: General;  Laterality: N/A;  Lateral Internal Sphincterotomy  . TRIGGER FINGER RELEASE Left      ring finger  . TUBAL LIGATION               Family History  Problem Relation Age of Onset  . Hypertension Mother    . Arthritis Mother    . COPD Mother    . Diabetes Mother    . Heart disease Mother    . Hypertension Father    . Parkinson's disease Father    . Colon cancer Neg Hx      Social History         Tobacco Use  . Smoking status: Light Tobacco Smoker      Packs/day: 0.25      Years: 20.00      Pack years: 5.00      Types: Cigarettes  . Smokeless tobacco: Never Used  . Tobacco comment: smokes 1 cig daily  Substance Use Topics  . Alcohol use: Yes  Comment: occasional  . Drug use: No           Allergies  Allergen Reactions  . Lisinopril Swelling      swelling to entire mouth.  . Penicillins Rash      Has patient had a PCN reaction causing immediate rash, facial/tongue/throat swelling, SOB or lightheadedness with hypotension: no - next day Has patient had a PCN reaction causing severe rash involving mucus membranes or skin necrosis: No Has patient had a PCN reaction that required hospitalization No Has patient had a PCN reaction occurring within the last 10 years: Yes If all of the above answers are "NO", then may proceed with Cephalosporin use.    . Tramadol Other (See Comments)      dizziness        Active Medications      Current Meds  Medication Sig  . albuterol (VENTOLIN HFA) 108 (90 Base) MCG/ACT inhaler Inhale 1-2 puffs into the lungs every 6 (six) hours as needed for wheezing or shortness of breath.  . cyclobenzaprine (FLEXERIL) 10 MG tablet Take 1 tablet (10 mg total) by mouth at bedtime.  . hydrochlorothiazide (HYDRODIURIL) 25 MG tablet TAKE 1 TABLET BY MOUTH EVERY DAY  . HYDROcodone-acetaminophen  (NORCO/VICODIN) 5-325 MG tablet One tablet every six hours as needed for pain.  Must last 28 days.  . hydrOXYzine (ATARAX/VISTARIL) 25 MG tablet Take 1 tablet (25 mg total) by mouth every 6 (six) hours as needed for anxiety.  . naproxen (NAPROSYN) 500 MG tablet Take 1 tablet (500 mg total) by mouth 2 (two) times daily with a meal.  . [DISCONTINUED] HYDROcodone-acetaminophen (NORCO/VICODIN) 5-325 MG tablet One tablet every six hours as needed for pain.  Must last 28 days.        BP 120/84   Pulse 93   Ht 5' 1.5" (1.562 m)   Wt 113 lb (51.3 kg)   BMI 21.01 kg/m    Physical Exam Vitals and nursing note reviewed.  Constitutional:      Appearance: Normal appearance.  Neurological:     Mental Status: She is alert and oriented to person, place, and time.  Psychiatric:        Mood and Affect: Mood normal.        Ortho Exam   Left ring finger mass abutting against the small finger feels firm 1 x 1 cm appears mobile under the tissue Nontender to palpation No lymphadenopathy in the left side Normal flexion extension and digit tendon function No neurovascular deficits no color changes     MEDICAL DECISION SECTION  xrays ordered? no My independent reading of xrays: no       Encounter Diagnosis  Name Primary?  . Lesion of finger, left ring finger Yes        PLAN:    Preop assessments:   Risk of surgery: Bleeding infection risk of amputation numbness tingling after surgery     Surgical procedure planned: Excision of mass left ring finger   The procedure has been fully reviewed with the patient; The risks and benefits of surgery have been discussed and explained and understood. Alternative treatment has also been reviewed, questions were encouraged and answered. The postoperative plan is also been reviewed.   Nonsurgical treatment as described in the history and physical section was attempted and unsuccessful and the patient has agreed to proceed with surgical intervention  to improve their situation.   Addendum: Patient is also being seen by Dr. Luna Glasgow for chronic knee pain and  is on Vicodin/Norco, she wanted her medication refilled and I went ahead and refilled it and send Dr. Luna Glasgow a message regarding that.       Meds ordered this encounter  Medications  . HYDROcodone-acetaminophen (NORCO/VICODIN) 5-325 MG tablet      Sig: One tablet every six hours as needed for pain.  Must last 28 days.      Dispense:  56 tablet      Refill:  0      Arther Abbott, MD 10/07/2019 3:45 PM

## 2019-11-13 NOTE — Transfer of Care (Signed)
Immediate Anesthesia Transfer of Care Note  Patient: Judy Cross  Procedure(s) Performed: EXCISION MASS UPPER EXTREMETIES ring finger left (Left Ring Finger)  Patient Location: PACU  Anesthesia Type:General  Level of Consciousness: awake  Airway & Oxygen Therapy: Patient Spontanous Breathing  Post-op Assessment: Report given to RN  Post vital signs: Reviewed  Last Vitals:  Vitals Value Taken Time  BP    Temp    Pulse    Resp    SpO2      Last Pain:  Vitals:   11/13/19 0639  TempSrc: Oral  PainSc: 2       Patients Stated Pain Goal: 5 ([id number])  Complications: No apparent anesthesia complications

## 2019-11-13 NOTE — Discharge Instructions (Signed)
PATIENT INSTRUCTIONS POST-ANESTHESIA  IMMEDIATELY FOLLOWING SURGERY:  Do not drive or operate machinery for the first twenty four hours after surgery.  Do not make any important decisions for twenty four hours after surgery or while taking narcotic pain medications or sedatives.  If you develop intractable nausea and vomiting or a severe headache please notify your doctor immediately.  FOLLOW-UP:  Please make an appointment with your surgeon as instructed. You do not need to follow up with anesthesia unless specifically instructed to do so.  WOUND CARE INSTRUCTIONS (if applicable):  Keep a dry clean dressing on the anesthesia/puncture wound site if there is drainage.  Once the wound has quit draining you may leave it open to air.  Generally you should leave the bandage intact for twenty four hours unless there is drainage.  If the epidural site drains for more than 36-48 hours please call the anesthesia department.  QUESTIONS?:  Please feel free to call your physician or the hospital operator if you have any questions, and they will be happy to assist you.      Incision Care, Adult An incision is a cut that a doctor makes in your skin for surgery (for a procedure). Most times, these cuts are closed after surgery. Your cut from surgery may be closed with stitches (sutures), staples, skin glue, or skin tape (adhesive strips). You may need to return to your doctor to have stitches or staples taken out. This may happen many days or many weeks after your surgery. The cut needs to be well cared for so it does not get infected. How to care for your cut Cut care   Follow instructions from your doctor about how to take care of your cut. Make sure you: ? Wash your hands with soap and water before you change your bandage (dressing). If you cannot use soap and water, use hand sanitizer. ? Change your bandage as told by your doctor. ? Leave stitches, skin glue, or skin tape in place. They may need to stay in  place for 2 weeks or longer. If tape strips get loose and curl up, you may trim the loose edges. Do not remove tape strips completely unless your doctor says it is okay.  Check your cut area every day for signs of infection. Check for: ? More redness, swelling, or pain. ? More fluid or blood. ? Warmth. ? Pus or a bad smell.  Ask your doctor how to clean the cut. This may include: ? Using mild soap and water. ? Using a clean towel to pat the cut dry after you clean it. ? Putting a cream or ointment on the cut. Do this only as told by your doctor. ? Covering the cut with a clean bandage.  Ask your doctor when you can leave the cut uncovered.  Do not take baths, swim, or use a hot tub until your doctor says it is okay. Ask your doctor if you can take showers. You may only be allowed to take sponge baths for bathing. Medicines  If you were prescribed an antibiotic medicine, cream, or ointment, take the antibiotic or put it on the cut as told by your doctor. Do not stop taking or putting on the antibiotic even if your condition gets better.  Take over-the-counter and prescription medicines only as told by your doctor. General instructions  Limit movement around your cut. This helps healing. ? Avoid straining, lifting, or exercise for the first month, or for as long as told by your   doctor. ? Follow instructions from your doctor about going back to your normal activities. ? Ask your doctor what activities are safe.  Protect your cut from the sun when you are outside for the first 6 months, or for as long as told by your doctor. Put on sunscreen around the scar or cover up the scar.  Keep all follow-up visits as told by your doctor. This is important. Contact a doctor if:  Your have more redness, swelling, or pain around the cut.  You have more fluid or blood coming from the cut.  Your cut feels warm to the touch.  You have pus or a bad smell coming from the cut.  You have a fever  or shaking chills.  You feel sick to your stomach (nauseous) or you throw up (vomit).  You are dizzy.  Your stitches or staples come undone. Get help right away if:  You have a red streak coming from your cut.  Your cut bleeds through the bandage and the bleeding does not stop with gentle pressure.  The edges of your cut open up and separate.  You have very bad (severe) pain.  You have a rash.  You are confused.  You pass out (faint).  You have trouble breathing and you have a fast heartbeat. This information is not intended to replace advice given to you by your health care provider. Make sure you discuss any questions you have with your health care provider. Document Revised: 10/16/2016 Document Reviewed: 02/04/2016 Elsevier Patient Education  2020 [employer]     

## 2019-11-13 NOTE — Anesthesia Preprocedure Evaluation (Signed)
Anesthesia Evaluation  Patient identified by MRN, date of birth, ID band Patient awake    Reviewed: Allergy & Precautions, NPO status , Patient's Chart, lab work & pertinent test results  History of Anesthesia Complications Negative for: history of anesthetic complications  Airway Mallampati: II  TM Distance: >3 FB Neck ROM: Full    Dental  (+) Partial Upper, Missing, Dental Advisory Given   Pulmonary asthma , Current Smoker and Patient abstained from smoking.,    Pulmonary exam normal breath sounds clear to auscultation- rhonchi (-) decreased breath sounds(-) wheezing (-) stridorstable (-) rales(-) intubated    Cardiovascular Exercise Tolerance: Good hypertension, Pt. on medications Normal cardiovascular exam Rhythm:Regular Rate:Normal  07-Nov-2019 11:09:07 [facility] ROUTINE RECORD Normal sinus rhythm Septal infarct , age undetermined Abnormal ECG No significant change since last tracing Confirmed by Shelva Majestic [phone]) on 11/07/2019 6:57:33 PM 40mm/s 70mm/mV 100Hz  9.0.4 12SL 243 CID: [id number] Referred by: Ilda Mori   Neuro/Psych Seizures -,  Anxiety  Neuromuscular disease    GI/Hepatic   Endo/Other    Renal/GU      Musculoskeletal  (+) Arthritis  (back pain), Osteoarthritis,    Abdominal   Peds  Hematology   Anesthesia Other Findings   Reproductive/Obstetrics                           Anesthesia Physical Anesthesia Plan  ASA: II  Anesthesia Plan: General   Post-op Pain Management:    Induction: Intravenous  PONV Risk Score and Plan: 3 and Ondansetron and Midazolam  Airway Management Planned: Nasal Cannula, Natural Airway and Simple Face Mask  Additional Equipment:   Intra-op Plan:   Post-operative Plan:   Informed Consent: I have reviewed the patients History and Physical, chart, labs and discussed the procedure including the risks,  benefits and alternatives for the proposed anesthesia with the patient or authorized representative who has indicated his/her understanding and acceptance.     Dental advisory given  Plan Discussed with: CRNA and Surgeon  Anesthesia Plan Comments: (Local block by Dr. Aline Brochure and TIVA. Possible GA with airway was discussed. )       Anesthesia Quick Evaluation

## 2019-11-13 NOTE — Interval H&P Note (Signed)
History and Physical Interval Note:  11/13/2019 7:26 AM  Judy Cross  has presented today for surgery, with the diagnosis of mass left ring finger.  The various methods of treatment have been discussed with the patient and family. After consideration of risks, benefits and other options for treatment, the patient has consented to  Procedure(s): EXCISION MASS UPPER EXTREMETIES ring finger left (Left) as a surgical intervention.  The patient's history has been reviewed, patient examined, no change in status, stable for surgery.  I have reviewed the patient's chart and labs.  Questions were answered to the patient's satisfaction.     Arther Abbott

## 2019-11-14 LAB — SURGICAL PATHOLOGY

## 2019-11-19 ENCOUNTER — Other Ambulatory Visit: Payer: Self-pay

## 2019-11-19 ENCOUNTER — Ambulatory Visit (HOSPITAL_COMMUNITY)
Admission: RE | Admit: 2019-11-19 | Discharge: 2019-11-19 | Disposition: A | Discharge status: Home / Self Care | Payer: Medicaid Other | Source: Ambulatory Visit | Attending: Orthopaedic Surgery | Admitting: Orthopaedic Surgery

## 2019-11-19 DIAGNOSIS — G8929 Other chronic pain: Secondary | ICD-10-CM | POA: Diagnosis present

## 2019-11-19 DIAGNOSIS — M25562 Pain in left knee: Secondary | ICD-10-CM | POA: Insufficient documentation

## 2019-11-19 DIAGNOSIS — R600 Localized edema: Secondary | ICD-10-CM | POA: Diagnosis not present

## 2019-11-20 ENCOUNTER — Encounter: Payer: Self-pay | Admitting: Orthopaedic Surgery

## 2019-11-20 ENCOUNTER — Ambulatory Visit: Payer: Medicaid Other | Admitting: Orthopaedic Surgery

## 2019-11-20 ENCOUNTER — Encounter: Payer: Self-pay | Admitting: Orthopedic Surgery

## 2019-11-20 VITALS — BP 159/112 | HR 67 | Ht 61.0 in | Wt 125.0 lb

## 2019-11-20 DIAGNOSIS — G8929 Other chronic pain: Secondary | ICD-10-CM

## 2019-11-20 DIAGNOSIS — M25562 Pain in left knee: Secondary | ICD-10-CM

## 2019-11-20 DIAGNOSIS — F17210 Nicotine dependence, cigarettes, uncomplicated: Secondary | ICD-10-CM | POA: Diagnosis not present

## 2019-11-20 NOTE — Progress Notes (Signed)
Patient Judy Cross, female DOB:1966/01/10, 54 y.o. IOX:[id number]  Chief Complaint  Patient presents with  . Knee Pain    left review MRI    HPI  Judy Cross is a 54 y.o. female who has pain of the left knee.  She had MRI which showed: IMPRESSION: 1. No acute findings or evidence of internal derangement. The menisci, cruciate and collateral ligaments are intact. 2. No acute osseous findings or significant arthropathic changes. 3. 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 These findings can be seen with mild patellar tendon-lateral femoral condyle friction syndrome.  I have independently reviewed the MRI.  I have explained findings of the MRI.  She does not need surgery. Continue present medicine.   Body mass index is 23.62 kg/m.  ROS  Review of Systems  All other systems reviewed and are negative.  The following is a summary of the past history medically, past history surgically, known current medicines, social history and family history.  This information is gathered electronically by the computer from prior information and documentation.  I review this each visit and have found including this information at this point in the chart is beneficial and informative.    Past Medical History:  Diagnosis Date  . Allergy   . Anxiety 06/12/2019  . Arthritis   . Asthma in adult, mild intermittent, uncomplicated 2/68/3419  . Breast nodule 10/12/2015  . Breast nodule 10/12/2015  . 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 Endometrial polyp 04/28/2013  . Enlarged uterus 03/26/2013  . Fibroids 04/28/2013  . Hot flashes 03/26/2013  . Hot flashes 03/26/2013  . Hypertension   . Irregular bleeding 03/26/2013  . Neuromuscular disorder (HCC)    sciatica  . Seasonal allergies   . Seizures ([place])    last seizure was 2 years ago; unknown etiology. On Keppra.    Past Surgical History:  Procedure Laterality Date  . ABLATION     with polyp removal.   . CESAREAN SECTION     x 3  . COLONOSCOPY WITH PROPOFOL N/A 01/11/2016   Procedure: COLONOSCOPY WITH PROPOFOL;  Surgeon: Danie Binder, MD;  [facility];  Service: Endoscopy;  Laterality: N/A;  800  . EXAMINATION UNDER ANESTHESIA  02/28/2012   Procedure: EXAM UNDER ANESTHESIA;  Surgeon: Donato Heinz, MD;  Location: [facility];  Service: General;  Laterality: N/A;  . EXCISION MASS UPPER EXTREMETIES Left 11/13/2019   Procedure: EXCISION MASS UPPER EXTREMETIES ring finger left;  Surgeon: Carole Civil, MD;  Location: [facility];  Service: Orthopedics;  Laterality: Left;  . GANGLION CYST EXCISION Left   . HYSTEROSCOPY WITH D & C N/A 05/23/2013   Procedure: DILATATION AND CURETTAGE /HYSTEROSCOPY;  Surgeon: Jonnie Kind, MD;  Location: [facility];  Service: Gynecology;  Laterality: N/A;  . POLYPECTOMY N/A 05/23/2013   Procedure: ENDOMETRIAL POLYP REMOVAL;  Surgeon: Jonnie Kind, MD;  Location: [facility];  Service: Gynecology;  Laterality: N/A;  . SPHINCTEROTOMY  02/28/2012   Procedure: SPHINCTEROTOMY;  Surgeon: Donato Heinz, MD;  Location: [facility];  Service: General;  Laterality: N/A;  Lateral Internal Sphincterotomy  . TRIGGER FINGER RELEASE Left    ring finger  . TUBAL LIGATION      Family History  Problem Relation Age of Onset  . Hypertension Mother   . Arthritis Mother   . COPD Mother   . Diabetes Mother   . Heart disease Mother   . Hypertension Father   .  Parkinson's disease Father   . Colon cancer Neg Hx     Social History Social History   Tobacco Use  . Smoking status: Light Tobacco Smoker    Packs/day: 0.25    Years: 20.00    Pack years: 5.00    Types: Cigarettes  . Smokeless tobacco: Never Used  . Tobacco comment: smokes 1 cig daily  Vaping Use  . Vaping Use: Never used  Substance Use Topics  . Alcohol use: Yes    Comment: occasional  . Drug use: No    Allergies  Allergen Reactions  . Lisinopril Swelling    swelling to entire mouth.  . Penicillins  Rash    Has patient had a PCN reaction causing immediate rash, facial/tongue/throat swelling, SOB or lightheadedness with hypotension: no - next day Has patient had a PCN reaction causing severe rash involving mucus membranes or skin necrosis: No Has patient had a PCN reaction that required hospitalization No Has patient had a PCN reaction occurring within the last 10 years: Yes If all of the above answers are "NO", then may proceed with Cephalosporin use.   . Tramadol Other (See Comments)    dizziness    Current Outpatient Medications  Medication Sig Dispense Refill  . albuterol (VENTOLIN HFA) 108 (90 Base) MCG/ACT inhaler INHALE 1-2 PUFFS INTO THE LUNGS EVERY 6 (SIX) HOURS AS NEEDED FOR WHEEZING OR SHORTNESS OF BREATH. 18 g 0  . hydrochlorothiazide (HYDRODIURIL) 25 MG tablet TAKE 1 TABLET BY MOUTH EVERY DAY (Patient taking differently: Take 25 mg by mouth daily. ) 90 tablet 2  . HYDROcodone-acetaminophen (NORCO/VICODIN) 5-325 MG tablet One tablet every six hours as needed for pain.  Must last 28 days. 56 tablet 0   No current facility-administered medications for this visit.     Physical Exam  Blood pressure (!) 159/112, pulse 67, height 5\' 1"  (1.549 m), weight 125 lb (56.7 kg).  Constitutional: overall normal hygiene, normal nutrition, well developed, normal grooming, normal body habitus. Assistive device:none  Musculoskeletal: gait and station Limp none, muscle tone and strength are normal, no tremors or atrophy is present.  .  Neurological: coordination overall normal.  Deep tendon reflex/nerve stretch intact.  Sensation normal.  Cranial nerves II-XII intact.   Skin:   Normal overall no scars, lesions, ulcers or rashes. No psoriasis.  Psychiatric: Alert and oriented x 3.  Recent memory intact, remote memory unclear.  Normal mood and affect. Well groomed.  Good eye contact.  Cardiovascular: overall no swelling, no varicosities, no edema bilaterally, normal temperatures of the  legs and arms, no clubbing, cyanosis and good capillary refill.  Lymphatic: palpation is normal.  All other systems reviewed and are negative   The patient has been educated about the nature of the problem(s) and counseled on treatment options.  The patient appeared to understand what I have discussed and is in agreement with it.  Encounter Diagnoses  Name Primary?  . Chronic pain of left knee Yes  . Cigarette nicotine dependence without complication     PLAN Call if any problems.  Precautions discussed.  Continue current medications.   Return to clinic 1 month   Electronically Signed [name], MD 6/10/202110:31 AM

## 2019-11-20 NOTE — Patient Instructions (Signed)
Steps to Quit Smoking Smoking tobacco is the leading cause of preventable death. It can affect almost every organ in the body. Smoking puts you and people around you at risk for many serious, long-lasting (chronic) diseases. Quitting smoking can be hard, but it is one of the best things that you can do for your health. It is never too late to quit. How do I get ready to quit? When you decide to quit smoking, make a plan to help you succeed. Before you quit:  Pick a date to quit. Set a date within the next 2 weeks to give you time to prepare.  Write down the reasons why you are quitting. Keep this list in places where you will see it often.  Tell your family, friends, and co-workers that you are quitting. Their support is important.  Talk with your doctor about the choices that may help you quit.  Find out if your health insurance will pay for these treatments.  Know the people, places, things, and activities that make you want to smoke (triggers). Avoid them. What first steps can I take to quit smoking?  Throw away all cigarettes at home, at work, and in your car.  Throw away the things that you use when you smoke, such as ashtrays and lighters.  Clean your car. Make sure to empty the ashtray.  Clean your home, including curtains and carpets. What can I do to help me quit smoking? Talk with your doctor about taking medicines and seeing a counselor at the same time. You are more likely to succeed when you do both.  If you are pregnant or breastfeeding, talk with your doctor about counseling or other ways to quit smoking. Do not take medicine to help you quit smoking unless your doctor tells you to do so. To quit smoking: Quit right away  Quit smoking totally, instead of slowly cutting back on how much you smoke over a period of time.  Go to counseling. You are more likely to quit if you go to counseling sessions regularly. Take medicine You may take medicines to help you quit. Some  medicines need a prescription, and some you can buy over-the-counter. Some medicines may contain a drug called nicotine to replace the nicotine in cigarettes. Medicines may:  Help you to stop having the desire to smoke (cravings).  Help to stop the problems that come when you stop smoking (withdrawal symptoms). Your doctor may ask you to use:  Nicotine patches, gum, or lozenges.  Nicotine inhalers or sprays.  Non-nicotine medicine that is taken by mouth. Find resources Find resources and other ways to help you quit smoking and remain smoke-free after you quit. These resources are most helpful when you use them often. They include:  Online chats with a counselor.  Phone quitlines.  Printed self-help materials.  Support groups or group counseling.  Text messaging programs.  Mobile phone apps. Use apps on your mobile phone or tablet that can help you stick to your quit plan. There are many free apps for mobile phones and tablets as well as websites. Examples include Quit Guide from the CDC and smokefree.gov  What things can I do to make it easier to quit?   Talk to your family and friends. Ask them to support and encourage you.  Call a phone quitline (1-800-QUIT-NOW), reach out to support groups, or work with a counselor.  Ask people who smoke to not smoke around you.  Avoid places that make you want to smoke,   such as: ? Bars. ? Parties. ? Smoke-break areas at work.  Spend time with people who do not smoke.  Lower the stress in your life. Stress can make you want to smoke. Try these things to help your stress: ? Getting regular exercise. ? Doing deep-breathing exercises. ? Doing yoga. ? Meditating. ? Doing a body scan. To do this, close your eyes, focus on one area of your body at a time from head to toe. Notice which parts of your body are tense. Try to relax the muscles in those areas. How will I feel when I quit smoking? Day 1 to 3 weeks Within the first 24 hours,  you may start to have some problems that come from quitting tobacco. These problems are very bad 2-3 days after you quit, but they do not often last for more than 2-3 weeks. You may get these symptoms:  Mood swings.  Feeling restless, nervous, angry, or annoyed.  Trouble concentrating.  Dizziness.  Strong desire for high-sugar foods and nicotine.  Weight gain.  Trouble pooping (constipation).  Feeling like you may vomit (nausea).  Coughing or a sore throat.  Changes in how the medicines that you take for other issues work in your body.  Depression.  Trouble sleeping (insomnia). Week 3 and afterward After the first 2-3 weeks of quitting, you may start to notice more positive results, such as:  Better sense of smell and taste.  Less coughing and sore throat.  Slower heart rate.  Lower blood pressure.  Clearer skin.  Better breathing.  Fewer sick days. Quitting smoking can be hard. Do not give up if you fail the first time. Some people need to try a few times before they succeed. Do your best to stick to your quit plan, and talk with your doctor if you have any questions or concerns. Summary  Smoking tobacco is the leading cause of preventable death. Quitting smoking can be hard, but it is one of the best things that you can do for your health.  When you decide to quit smoking, make a plan to help you succeed.  Quit smoking right away, not slowly over a period of time.  When you start quitting, seek help from your doctor, family, or friends. This information is not intended to replace advice given to you by your health care provider. Make sure you discuss any questions you have with your health care provider. Document Revised: 02/21/2019 Document Reviewed: 08/17/2018 Elsevier Patient Education  2020 [employer].  

## 2019-11-28 ENCOUNTER — Encounter: Payer: Self-pay | Admitting: Orthopedic Surgery

## 2019-11-28 ENCOUNTER — Other Ambulatory Visit: Payer: Self-pay

## 2019-11-28 ENCOUNTER — Ambulatory Visit (INDEPENDENT_AMBULATORY_CARE_PROVIDER_SITE_OTHER): Payer: Medicaid Other | Admitting: Orthopedic Surgery

## 2019-11-28 DIAGNOSIS — D481 Neoplasm of uncertain behavior of connective and other soft tissue: Secondary | ICD-10-CM

## 2019-11-28 NOTE — Progress Notes (Signed)
Chief Complaint  Patient presents with  . 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  Wound looks good.  Patient has full range of motion.  She will follow up on an as-needed basis  Encounter Diagnosis  Name Primary?  . 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

## 2019-12-01 ENCOUNTER — Ambulatory Visit: Payer: Medicaid Other | Admitting: Orthopedic Surgery

## 2019-12-05 ENCOUNTER — Other Ambulatory Visit: Payer: Self-pay | Admitting: Radiology

## 2019-12-05 DIAGNOSIS — G8929 Other chronic pain: Secondary | ICD-10-CM

## 2019-12-05 DIAGNOSIS — M25562 Pain in left knee: Secondary | ICD-10-CM

## 2019-12-07 NOTE — Telephone Encounter (Signed)
I ve finished treating her finger   Ask Dr Raliegh Ip if she can still have hydrocodone

## 2019-12-08 ENCOUNTER — Telehealth: Payer: Self-pay | Admitting: Orthopaedic Surgery

## 2019-12-08 DIAGNOSIS — G8929 Other chronic pain: Secondary | ICD-10-CM

## 2019-12-08 MED ORDER — HYDROCODONE-ACETAMINOPHEN 5-325 MG PO TABS: ORAL_TABLET | ORAL | 0 refills | Status: DC

## 2019-12-08 NOTE — Telephone Encounter (Signed)
NO. SHE SHOULD TAKE TYLENOL FOR HER FINGER ITS MORE THAN 2 WEEKS AFTER SURGERY

## 2019-12-08 NOTE — Telephone Encounter (Signed)
Patient requests refill on Hydrocodone/Actaminophen 5-325  Mgs.  Qty  64  Sig: One tablet every six hours as needed for pain. Must last 28 days.  Patient states she uses [facility]

## 2019-12-08 NOTE — Telephone Encounter (Signed)
See all notes, I called patient and advised to use tylenol for finger pain.  She now tells me that the requested hydrocodone was NOT for the finger but was for the knee.  She is asking if you will refill the medication for her knee pain.

## 2019-12-08 NOTE — Telephone Encounter (Signed)
She had said the meds were for her finger, so I sent to you.  Does she need to come in for eval if still bothering her?  Or I can call her and discuss other, just let me know?  Thanks.

## 2019-12-18 ENCOUNTER — Other Ambulatory Visit: Payer: Self-pay

## 2019-12-18 ENCOUNTER — Ambulatory Visit: Payer: Medicaid Other | Admitting: Orthopaedic Surgery

## 2019-12-25 DIAGNOSIS — Z23 Encounter for immunization: Secondary | ICD-10-CM | POA: Diagnosis not present

## 2020-01-06 ENCOUNTER — Telehealth: Payer: Self-pay | Admitting: Orthopaedic Surgery

## 2020-01-06 ENCOUNTER — Other Ambulatory Visit: Payer: Self-pay | Admitting: Family Medicine

## 2020-01-06 DIAGNOSIS — J4520 Mild intermittent asthma, uncomplicated: Secondary | ICD-10-CM

## 2020-01-06 DIAGNOSIS — M25562 Pain in left knee: Secondary | ICD-10-CM

## 2020-01-06 DIAGNOSIS — G8929 Other chronic pain: Secondary | ICD-10-CM

## 2020-01-06 MED ORDER — HYDROCODONE-ACETAMINOPHEN 5-325 MG PO TABS: ORAL_TABLET | ORAL | 0 refills | Status: DC

## 2020-01-06 NOTE — Telephone Encounter (Signed)
Patient requests refill on Hydrocodone/Actaminophen 5-325  Mgs.  Qty  103  Sig: One tablet every six hours as needed for pain. Must last 28 days.  Patient states she uses [facility]

## 2020-01-08 ENCOUNTER — Encounter: Payer: Self-pay | Admitting: Orthopaedic Surgery

## 2020-01-08 ENCOUNTER — Ambulatory Visit (INDEPENDENT_AMBULATORY_CARE_PROVIDER_SITE_OTHER): Payer: Medicaid Other | Admitting: Orthopaedic Surgery

## 2020-01-08 ENCOUNTER — Other Ambulatory Visit: Payer: Self-pay

## 2020-01-08 DIAGNOSIS — F17210 Nicotine dependence, cigarettes, uncomplicated: Secondary | ICD-10-CM

## 2020-01-08 DIAGNOSIS — G8929 Other chronic pain: Secondary | ICD-10-CM

## 2020-01-08 DIAGNOSIS — M25562 Pain in left knee: Secondary | ICD-10-CM

## 2020-01-08 NOTE — Progress Notes (Signed)
PROCEDURE NOTE:  The patient requests injections of the left knee , verbal consent was obtained.  The left knee was prepped appropriately after time out was performed.   Sterile technique was observed and injection of 1 cc of Depo-Medrol 40 mg with several cc's of plain xylocaine. Anesthesia was provided by ethyl chloride and a 20-gauge needle was used to inject the knee area. The injection was tolerated well.  A band aid dressing was applied.  The patient was advised to apply ice later today and tomorrow to the injection sight as needed.  Return in six weeks.  Electronically Signed [name], MD 7/29/20218:46 AM

## 2020-02-06 ENCOUNTER — Telehealth: Payer: Self-pay | Admitting: Orthopaedic Surgery

## 2020-02-06 DIAGNOSIS — G8929 Other chronic pain: Secondary | ICD-10-CM

## 2020-02-06 NOTE — Telephone Encounter (Signed)
Patient called for refill: (relayed protocol for requests to be received by noon on Thursdays) HYDROcodone-acetaminophen (NORCO/VICODIN) 5-325 MG tablet 56 tablet  -[facility], [place]

## 2020-02-09 MED ORDER — HYDROCODONE-ACETAMINOPHEN 5-325 MG PO TABS: ORAL_TABLET | ORAL | 0 refills | Status: DC

## 2020-02-15 ENCOUNTER — Emergency Department (HOSPITAL_COMMUNITY): Payer: Medicaid Other

## 2020-02-15 ENCOUNTER — Encounter (HOSPITAL_COMMUNITY): Payer: Self-pay

## 2020-02-15 ENCOUNTER — Emergency Department (HOSPITAL_COMMUNITY)
Admission: EM | Admit: 2020-02-15 | Discharge: 2020-02-15 | Disposition: A | Discharge status: Home / Self Care | Payer: Medicaid Other | Attending: Emergency Medicine | Admitting: Emergency Medicine

## 2020-02-15 ENCOUNTER — Other Ambulatory Visit: Payer: Self-pay

## 2020-02-15 DIAGNOSIS — Z79899 Other long term (current) drug therapy: Secondary | ICD-10-CM | POA: Diagnosis not present

## 2020-02-15 DIAGNOSIS — Y999 Unspecified external cause status: Secondary | ICD-10-CM | POA: Diagnosis not present

## 2020-02-15 DIAGNOSIS — S20212A Contusion of left front wall of thorax, initial encounter: Secondary | ICD-10-CM | POA: Diagnosis not present

## 2020-02-15 DIAGNOSIS — J45909 Unspecified asthma, uncomplicated: Secondary | ICD-10-CM | POA: Insufficient documentation

## 2020-02-15 DIAGNOSIS — X509XXA Other and unspecified overexertion or strenuous movements or postures, initial encounter: Secondary | ICD-10-CM | POA: Diagnosis not present

## 2020-02-15 DIAGNOSIS — I10 Essential (primary) hypertension: Secondary | ICD-10-CM | POA: Diagnosis not present

## 2020-02-15 DIAGNOSIS — S299XXA Unspecified injury of thorax, initial encounter: Secondary | ICD-10-CM | POA: Diagnosis not present

## 2020-02-15 DIAGNOSIS — Y939 Activity, unspecified: Secondary | ICD-10-CM | POA: Diagnosis not present

## 2020-02-15 DIAGNOSIS — Y929 Unspecified place or not applicable: Secondary | ICD-10-CM | POA: Insufficient documentation

## 2020-02-15 MED ORDER — LIDOCAINE 5 % EX PTCH: 1.0000 | MEDICATED_PATCH | CUTANEOUS | 0 refills | Status: DC
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
## 2020-02-15 NOTE — Discharge Instructions (Addendum)
Your chest x-ray today did not show evidence of a punctured lung or broken rib.  You likely have bruised ribs.  I have prescribed a lidocaine pain patch that you may use as directed for your symptoms.  You may continue taking your hydrocodone as directed.  Follow-up with your primary doctor for recheck.

## 2020-02-17 ENCOUNTER — Encounter: Payer: Self-pay | Admitting: Family Medicine

## 2020-02-17 ENCOUNTER — Other Ambulatory Visit: Payer: Self-pay

## 2020-02-17 ENCOUNTER — Telehealth (INDEPENDENT_AMBULATORY_CARE_PROVIDER_SITE_OTHER): Payer: Medicaid Other | Admitting: Family Medicine

## 2020-02-17 VITALS — BP 112/81 | Ht 61.0 in | Wt 123.0 lb

## 2020-02-17 DIAGNOSIS — Z09 Encounter for follow-up examination after completed treatment for conditions other than malignant neoplasm: Secondary | ICD-10-CM | POA: Diagnosis not present
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
## 2020-02-17 NOTE — Assessment & Plan Note (Signed)
She was not found to have any broken ribs at the hospital. But she reports that she is still feeling poorly and not feeling any better. She denies having any symptom relief with the lidocaine. She is already on some pain medicine from the orthopedic doctor. I have encouraged her to try ibuprofen and heating pad. If she gets no relief that she should return back to the emergency room in case a fracture was missed on x-ray. She reports verbal understanding of this.

## 2020-02-19 ENCOUNTER — Other Ambulatory Visit: Payer: Self-pay

## 2020-02-19 ENCOUNTER — Ambulatory Visit (INDEPENDENT_AMBULATORY_CARE_PROVIDER_SITE_OTHER): Payer: Medicaid Other | Admitting: Orthopaedic Surgery

## 2020-02-19 ENCOUNTER — Encounter: Payer: Self-pay | Admitting: Orthopaedic Surgery

## 2020-02-19 DIAGNOSIS — F17210 Nicotine dependence, cigarettes, uncomplicated: Secondary | ICD-10-CM

## 2020-02-19 DIAGNOSIS — M25562 Pain in left knee: Secondary | ICD-10-CM

## 2020-02-19 DIAGNOSIS — G8929 Other chronic pain: Secondary | ICD-10-CM | POA: Diagnosis not present
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
## 2020-02-19 NOTE — Progress Notes (Signed)
PROCEDURE NOTE:  The patient requests injections of the left knee , verbal consent was obtained.  The left knee was prepped appropriately after time out was performed.   Sterile technique was observed and injection of 1 cc of Depo-Medrol 40 mg with several cc's of plain xylocaine. Anesthesia was provided by ethyl chloride and a 20-gauge needle was used to inject the knee area. The injection was tolerated well.  A band aid dressing was applied.  The patient was advised to apply ice later today and tomorrow to the injection sight as needed.  Return in six weeks.  Electronically Signed [name], MD 9/9/20218:07 AM

## 2020-02-24 ENCOUNTER — Encounter (HOSPITAL_COMMUNITY): Payer: Self-pay | Admitting: Physical Therapy

## 2020-02-24 NOTE — Therapy (Signed)
[place], [place], [address] Phone: [phone]   Fax:  [phone]  Patient Details  Name: Judy Cross MRN: [id number] Date of Birth: June 04, 1966 Referring Provider:  No ref. provider found  Encounter Date: 02/24/2020   PHYSICAL THERAPY DISCHARGE SUMMARY  Visits from Start of Care: 6  Current functional level related to goals / functional outcomes: Unknown as patient did not return for re-assessment    Remaining deficits: Unknown as patient did not return for re-assessment    Education / Equipment: HEP  Plan: Patient agrees to discharge.  Patient goals were partially met. Patient is being discharged due to not returning since the last visit.  ?????     Clarene Critchley PT, DPT 11:20 AM, 02/24/20 [place], [place], [address] Phone: [phone]   Fax:  [phone]

## 2020-03-08 ENCOUNTER — Telehealth: Payer: Self-pay | Admitting: Orthopaedic Surgery

## 2020-03-08 DIAGNOSIS — G8929 Other chronic pain: Secondary | ICD-10-CM

## 2020-03-08 DIAGNOSIS — M25562 Pain in left knee: Secondary | ICD-10-CM

## 2020-03-08 MED ORDER — HYDROCODONE-ACETAMINOPHEN 5-325 MG PO TABS: ORAL_TABLET | ORAL | 0 refills | Status: DC

## 2020-03-08 NOTE — Telephone Encounter (Signed)
Patient called and is requesting a refill on her medicine  HYDROcodone-Acetaminophen (Tab) NORCO/VICODIN 5-325 MG One tablet every six hours as needed for pain. Must last 28 days.

## 2020-03-08 NOTE — Addendum Note (Signed)
Addended by: Willette Pa on: 03/08/2020 12:31 PM   Modules accepted: Orders

## 2020-03-16 ENCOUNTER — Ambulatory Visit: Payer: Medicaid Other | Admitting: Internal Medicine

## 2020-03-23 ENCOUNTER — Encounter: Payer: Self-pay | Admitting: Internal Medicine

## 2020-03-23 ENCOUNTER — Other Ambulatory Visit: Payer: Self-pay

## 2020-03-23 ENCOUNTER — Ambulatory Visit (INDEPENDENT_AMBULATORY_CARE_PROVIDER_SITE_OTHER): Payer: Medicaid Other | Admitting: Internal Medicine

## 2020-03-23 ENCOUNTER — Encounter (HOSPITAL_COMMUNITY): Payer: Self-pay | Admitting: *Deleted

## 2020-03-23 ENCOUNTER — Emergency Department (HOSPITAL_COMMUNITY): Payer: Medicaid Other

## 2020-03-23 ENCOUNTER — Emergency Department (HOSPITAL_COMMUNITY)
Admission: EM | Admit: 2020-03-23 | Discharge: 2020-03-23 | Disposition: A | Discharge status: Home / Self Care | Payer: Medicaid Other | Attending: Emergency Medicine | Admitting: Emergency Medicine

## 2020-03-23 VITALS — BP 110/84 | HR 73 | Temp 98.0°F | Resp 18 | Ht 61.5 in | Wt 113.0 lb

## 2020-03-23 DIAGNOSIS — G40909 Epilepsy, unspecified, not intractable, without status epilepticus: Secondary | ICD-10-CM

## 2020-03-23 DIAGNOSIS — W228XXA Striking against or struck by other objects, initial encounter: Secondary | ICD-10-CM | POA: Diagnosis not present

## 2020-03-23 DIAGNOSIS — W19XXXA Unspecified fall, initial encounter: Secondary | ICD-10-CM | POA: Diagnosis not present

## 2020-03-23 DIAGNOSIS — R569 Unspecified convulsions: Secondary | ICD-10-CM | POA: Diagnosis not present

## 2020-03-23 DIAGNOSIS — S0990XA Unspecified injury of head, initial encounter: Secondary | ICD-10-CM | POA: Diagnosis not present

## 2020-03-23 DIAGNOSIS — Z5321 Procedure and treatment not carried out due to patient leaving prior to being seen by health care provider: Secondary | ICD-10-CM | POA: Diagnosis not present
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
## 2020-03-23 NOTE — Patient Instructions (Signed)
Please go to ER immediately as instructed for evaluation of head injury and possible seizure.

## 2020-03-23 NOTE — Progress Notes (Signed)
Acute Office Visit  Subjective:    Patient ID: Judy Cross, female    DOB: [birth year], 54 y.o.   MRN: [id number]  Chief Complaint  Patient presents with  . Seizures    x 1 episode early sunday morning    HPI Judy Cross is a 54 y.o. female with a past medical history of hypertension and seizure disorder who presents for evaluation after a fall at home 2 days ago.  She was trying to reach the bathroom and suddenly started feeling dizziness upon reaching the bathroom.  She also noticed shaking movements of her upper extremities before falling towards her left side.  She hit her head on an angle of the sink.  She denies any loss of consciousness.  She noticed a bulge on her left side of head, which has improved now.  She fell on her left side of the body, and states that her left arm, left-sided chest wall and the left knee are hurting.  Pain is sharp, constant and is mildly relieved with Tylenol.  She went to ER after fall, but could not be evaluated as the ER was full.  She states that she has not had any seizure episodes in last 10 years and is currently not on any medication for seizure.  Patient takes hydrochlorothiazide for her blood pressure at home.  She currently denies dizziness, lightheadedness, dyspnea or palpitations.  Past Medical History:  Diagnosis Date  . Allergy   . Anxiety 06/12/2019  . Arthritis   . Asthma in adult, mild intermittent, uncomplicated 7/61/6073  . Breast nodule 10/12/2015  . Breast nodule 10/12/2015  . 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 Endometrial polyp 04/28/2013  . Enlarged uterus 03/26/2013  . Fibroids 04/28/2013  . Hot flashes 03/26/2013  . Hot flashes 03/26/2013  . Hypertension   . Irregular bleeding 03/26/2013  . Neuromuscular disorder (HCC)    sciatica  . Seasonal allergies   . Seizures ([place])    last seizure was 2 years ago; unknown etiology. On Keppra.    Past Surgical History:  Procedure Laterality Date  . ABLATION      with polyp removal.  . CESAREAN SECTION     x 3  . COLONOSCOPY WITH PROPOFOL N/A 01/11/2016   Procedure: COLONOSCOPY WITH PROPOFOL;  Surgeon: Danie Binder, MD;  Location[facility];  Service: Endoscopy;  Laterality: N/A;  800  . EXAMINATION UNDER ANESTHESIA  02/28/2012   Procedure: EXAM UNDER ANESTHESIA;  Surgeon: Donato Heinz, MD;  Location: [facility] ORS;  Service: General;  Laterality: N/A;  . EXCISION MASS UPPER EXTREMETIES Left 11/13/2019   Procedure: EXCISION MASS UPPER EXTREMETIES ring finger left;  Surgeon: Carole Civil, MD;  Location: [facility];  Service: Orthopedics;  Laterality: Left;  . GANGLION CYST EXCISION Left   . HYSTEROSCOPY WITH D & C N/A 05/23/2013   Procedure: DILATATION AND CURETTAGE /HYSTEROSCOPY;  Surgeon: Jonnie Kind, MD;  Location: [facility];  Service: Gynecology;  Laterality: N/A;  . POLYPECTOMY N/A 05/23/2013   Procedure: ENDOMETRIAL POLYP REMOVAL;  Surgeon: Jonnie Kind, MD;  Location: [facility];  Service: Gynecology;  Laterality: N/A;  . SPHINCTEROTOMY  02/28/2012   Procedure: SPHINCTEROTOMY;  Surgeon: Donato Heinz, MD;  Location: [facility];  Service: General;  Laterality: N/A;  Lateral Internal Sphincterotomy  . TRIGGER FINGER RELEASE Left    ring finger  . TUBAL LIGATION      Family History  Problem Relation Age of  Onset  . Hypertension Mother   . Arthritis Mother   . COPD Mother   . Diabetes Mother   . Heart disease Mother   . Hypertension Father   . Parkinson's disease Father   . Colon cancer Neg Hx     Social History   Socioeconomic History  . Marital status: Single    Spouse name: Not on file  . Number of children: 3  . Years of education: 23  . Highest education level: Not on file  Occupational History  . Occupation: home health aid  Tobacco Use  . Smoking status: Former Smoker    Packs/day: 0.25    Years: 20.00    Pack years: 5.00    Types: Cigarettes  . Smokeless tobacco: Never Used  . Tobacco comment: smokes 1 cig daily   Vaping Use  . Vaping Use: Never used  Substance and Sexual Activity  . Alcohol use: Yes    Comment: occasional  . Drug use: No  . Sexual activity: Yes    Birth control/protection: Surgical    Comment: tubal and ablation  Other Topics Concern  . Not on file  Social History Narrative   Degree in child care   Currently is a home health aid   Lives at home with youngest daughter Jeanella Craze senior this year    Son 24-Jalon  in college for sociology    Daughter [place]      Enjoys: write, reading, cooking, drawing      Diet: eats all food groups   Caffeine: pepsi weekly    Water: 6-8 cups    Juice some times      Wears seat belt    Smoke detectors    Does not use phone with driving.   Social Determinants of Health   Financial Resource Strain: Low Risk   . Difficulty of Paying Living Expenses: Not very hard  Food Insecurity: Food Insecurity Present  . Worried About Charity fundraiser in the Last Year: Sometimes true  . Ran Out of Food in the Last Year: Sometimes true  Transportation Needs: No Transportation Needs  . Lack of Transportation (Medical): No  . Lack of Transportation (Non-Medical): No  Physical Activity: Inactive  . Days of Exercise per Week: 0 days  . Minutes of Exercise per Session: 0 min  Stress: Stress Concern Present  . Feeling of Stress : Rather much  Social Connections: Moderately Isolated  . Frequency of Communication with Friends and Family: More than three times a week  . Frequency of Social Gatherings with Friends and Family: More than three times a week  . Attends Religious Services: 1 to 4 times per year  . Active Member of Clubs or Organizations: No  . Attends Archivist Meetings: Never  . Marital Status: Never married  Intimate Partner Violence: Not At Risk  . Fear of Current or Ex-Partner: No  . Emotionally Abused: No  . Physically Abused: No  . Sexually Abused: No    Outpatient Medications Prior to  Visit  Medication Sig Dispense Refill  . hydrochlorothiazide (HYDRODIURIL) 25 MG tablet TAKE 1 TABLET BY MOUTH EVERY DAY (Patient taking differently: Take 25 mg by mouth daily. ) 90 tablet 2  . HYDROcodone-acetaminophen (NORCO/VICODIN) 5-325 MG tablet One tablet every six hours as needed for pain.  Must last 28 days. 56 tablet 0  . lidocaine (LIDODERM) 5 % Place 1 patch onto the skin daily. Remove & Discard patch within 12  hours or as directed by MD 30 patch 0  . PROAIR HFA 108 (90 Base) MCG/ACT inhaler INHALE 1-2 PUFFS INTO THE LUNGS EVERY 6 (SIX) HOURS AS NEEDED FOR WHEEZING OR SHORTNESS OF BREATH 18 g 0   No facility-administered medications prior to visit.    Allergies  Allergen Reactions  . Lisinopril Swelling    swelling to entire mouth.  . Penicillins Rash    Has patient had a PCN reaction causing immediate rash, facial/tongue/throat swelling, SOB or lightheadedness with hypotension: no - next day Has patient had a PCN reaction causing severe rash involving mucus membranes or skin necrosis: No Has patient had a PCN reaction that required hospitalization No Has patient had a PCN reaction occurring within the last 10 years: Yes If all of the above answers are "NO", then may proceed with Cephalosporin use.   . Tramadol Other (See Comments)    dizziness    Review of Systems  Constitutional: Negative for chills and fever.  HENT: Negative for congestion, sinus pressure, sinus pain and sore throat.   Eyes: Negative for pain and discharge.  Respiratory: Negative for cough and shortness of breath.   Cardiovascular: Negative for chest pain and palpitations.  Gastrointestinal: Negative for abdominal pain, constipation, diarrhea, nausea and vomiting.  Endocrine: Negative for polydipsia and polyuria.  Genitourinary: Negative for dysuria and hematuria.  Musculoskeletal: Negative for neck pain and neck stiffness.  Skin: Negative for rash.  Neurological: Positive for seizures. Negative for  dizziness and weakness.  Psychiatric/Behavioral: Negative for agitation and behavioral problems.       Objective:    Physical Exam Vitals reviewed.  Constitutional:      General: She is not in acute distress.    Appearance: She is not diaphoretic.  HENT:     Head: Normocephalic.     Comments: Bulging over left temporal area, no erythema or warmth    Nose: Nose normal.     Mouth/Throat:     Mouth: Mucous membranes are moist.  Eyes:     General: No scleral icterus.    Extraocular Movements: Extraocular movements intact.     Pupils: Pupils are equal, round, and reactive to light.  Cardiovascular:     Rate and Rhythm: Normal rate and regular rhythm.     Pulses: Normal pulses.     Heart sounds: No murmur heard.   Pulmonary:     Breath sounds: Normal breath sounds. No wheezing or rales.  Chest:     Chest wall: Tenderness present.  Abdominal:     Palpations: Abdomen is soft.     Tenderness: There is no abdominal tenderness.  Musculoskeletal:     Cervical back: Neck supple. No tenderness.     Right lower leg: No edema.     Left lower leg: No edema.     Comments: Left knee tenderness over lateral side, no swelling, erythema or warmth  Skin:    General: Skin is warm.     Findings: No rash.  Neurological:     General: No focal deficit present.     Mental Status: She is alert and oriented to person, place, and time.  Psychiatric:        Mood and Affect: Mood normal.        Behavior: Behavior normal.     BP 110/84   Pulse 73   Temp 98 F (36.7 C)   Resp 18   Ht 5' 1.5" (1.562 m)   Wt 113 lb (51.3 kg)  SpO2 98%   BMI 21.01 kg/m  Wt Readings from Last 3 Encounters:  03/23/20 113 lb (51.3 kg)  02/17/20 123 lb (55.8 kg)  02/15/20 123 lb (55.8 kg)    Health Maintenance Due  Topic Date Due  . Hepatitis C Screening  Never done  . COVID-19 Vaccine (1) Never done  . INFLUENZA VACCINE  01/11/2020    There are no preventive care reminders to display for this  patient.   Lab Results  Component Value Date   TSH 0.673 05/20/2019   Lab Results  Component Value Date   WBC 4.6 11/07/2019   HGB 13.2 11/07/2019   HCT 39.4 11/07/2019   MCV 98.7 11/07/2019   PLT 272 11/07/2019   Lab Results  Component Value Date   NA 142 11/07/2019   K 3.7 11/07/2019   CO2 24 11/07/2019   GLUCOSE 107 (H) 11/07/2019   BUN 15 11/07/2019   CREATININE 0.90 11/07/2019   BILITOT 1.2 05/20/2019   ALKPHOS 64 05/20/2019   AST 27 05/20/2019   ALT 23 05/20/2019   PROT 7.1 05/20/2019   ALBUMIN 4.7 05/20/2019   CALCIUM 9.1 11/07/2019   ANIONGAP 13 11/07/2019   Lab Results  Component Value Date   CHOL 199 05/20/2019   Lab Results  Component Value Date   HDL 95 05/20/2019   Lab Results  Component Value Date   LDLCALC 86 05/20/2019   Lab Results  Component Value Date   TRIG 105 05/20/2019   Lab Results  Component Value Date   CHOLHDL 2.1 05/20/2019   Lab Results  Component Value Date   HGBA1C 5.4 05/20/2019       Assessment & Plan:   Problem List Items Addressed This Visit    Fall, initial encounter Head injury In the bathroom, mentions shaking movements before fall Reports hitting head, with left temporal bulging, although improving Reports pain over left chest wall and left knee ER referral for CT head and CXR  H/o seizure disorder Was on Keppra in the past Fall could be due to seizure episode vs syncope ER referral for urgent Neurology evaluation    Informed ER attending at the [facility] about the patient.   No orders of the defined types were placed in this encounter.    Lindell Spar, MD

## 2020-03-24 ENCOUNTER — Telehealth: Payer: Self-pay | Admitting: *Deleted

## 2020-03-24 ENCOUNTER — Emergency Department (HOSPITAL_COMMUNITY): Payer: Medicaid Other

## 2020-03-24 ENCOUNTER — Emergency Department (HOSPITAL_COMMUNITY)
Admission: EM | Admit: 2020-03-24 | Discharge: 2020-03-24 | Disposition: A | Discharge status: Home / Self Care | Payer: Medicaid Other | Attending: Emergency Medicine | Admitting: Emergency Medicine

## 2020-03-24 ENCOUNTER — Encounter (HOSPITAL_COMMUNITY): Payer: Self-pay | Admitting: *Deleted

## 2020-03-24 ENCOUNTER — Other Ambulatory Visit: Payer: Self-pay

## 2020-03-24 DIAGNOSIS — W228XXA Striking against or struck by other objects, initial encounter: Secondary | ICD-10-CM | POA: Diagnosis not present

## 2020-03-24 DIAGNOSIS — M25512 Pain in left shoulder: Secondary | ICD-10-CM | POA: Diagnosis not present

## 2020-03-24 DIAGNOSIS — Z79899 Other long term (current) drug therapy: Secondary | ICD-10-CM | POA: Insufficient documentation

## 2020-03-24 DIAGNOSIS — E876 Hypokalemia: Secondary | ICD-10-CM | POA: Diagnosis not present

## 2020-03-24 DIAGNOSIS — W19XXXA Unspecified fall, initial encounter: Secondary | ICD-10-CM

## 2020-03-24 DIAGNOSIS — S0990XA Unspecified injury of head, initial encounter: Secondary | ICD-10-CM | POA: Diagnosis present

## 2020-03-24 DIAGNOSIS — J45909 Unspecified asthma, uncomplicated: Secondary | ICD-10-CM | POA: Diagnosis not present

## 2020-03-24 DIAGNOSIS — R569 Unspecified convulsions: Secondary | ICD-10-CM | POA: Diagnosis not present

## 2020-03-24 DIAGNOSIS — Z87891 Personal history of nicotine dependence: Secondary | ICD-10-CM | POA: Diagnosis not present

## 2020-03-24 DIAGNOSIS — I10 Essential (primary) hypertension: Secondary | ICD-10-CM | POA: Insufficient documentation

## 2020-03-24 DIAGNOSIS — M25552 Pain in left hip: Secondary | ICD-10-CM | POA: Diagnosis not present

## 2020-03-24 DIAGNOSIS — Y92002 Bathroom of unspecified non-institutional (private) residence single-family (private) house as the place of occurrence of the external cause: Secondary | ICD-10-CM | POA: Insufficient documentation

## 2020-03-24 DIAGNOSIS — R519 Headache, unspecified: Secondary | ICD-10-CM | POA: Diagnosis not present

## 2020-03-24 LAB — COMPREHENSIVE METABOLIC PANEL
ALT: 20 U/L (ref 0–44)
AST: 20 U/L (ref 15–41)
Albumin: 3.7 g/dL (ref 3.5–5.0)
Alkaline Phosphatase: 43 U/L (ref 38–126)
Anion gap: 13 (ref 5–15)
BUN: 8 mg/dL (ref 6–20)
CO2: 27 mmol/L (ref 22–32)
Calcium: 9 mg/dL (ref 8.9–10.3)
Chloride: 98 mmol/L (ref 98–111)
Creatinine, Ser: 0.74 mg/dL (ref 0.44–1.00)
GFR, Estimated: >60 mL/min (ref >60)
Glucose, Bld: 111 mg/dL — ABNORMAL HIGH (ref 70–99)
Potassium: 2.7 mmol/L — CL (ref 3.5–5.1)
Sodium: 138 mmol/L (ref 135–145)
Total Bilirubin: 1.1 mg/dL (ref 0.3–1.2)
Total Protein: 6.8 g/dL (ref 6.5–8.1)

## 2020-03-24 LAB — CBC WITH DIFFERENTIAL/PLATELET
Abs Immature Granulocytes: 0.01 10*3/uL (ref 0.00–0.07)
Basophils Absolute: 0 10*3/uL (ref 0.0–0.1)
Basophils Relative: 1 %
Eosinophils Absolute: 0.2 10*3/uL (ref 0.0–0.5)
Eosinophils Relative: 4 %
HCT: 36.5 % (ref 36.0–46.0)
Hemoglobin: 12.5 g/dL (ref 12.0–15.0)
Immature Granulocytes: 0 %
Lymphocytes Relative: 39 %
Lymphs Abs: 2.2 10*3/uL (ref 0.7–4.0)
MCH: 32.9 pg (ref 26.0–34.0)
MCHC: 34.2 g/dL (ref 30.0–36.0)
MCV: 96.1 fL (ref 80.0–100.0)
Monocytes Absolute: 0.4 10*3/uL (ref 0.1–1.0)
Monocytes Relative: 7 %
Neutro Abs: 2.8 10*3/uL (ref 1.7–7.7)
Neutrophils Relative %: 49 %
Platelets: 261 10*3/uL (ref 150–400)
RBC: 3.8 MIL/uL — ABNORMAL LOW (ref 3.87–5.11)
RDW: 12.2 % (ref 11.5–15.5)
WBC: 5.6 10*3/uL (ref 4.0–10.5)
nRBC: 0 % (ref 0.0–0.2)

## 2020-03-24 MED ORDER — POTASSIUM CHLORIDE CRYS ER 20 MEQ PO TBCR: 20.0000 meq | EXTENDED_RELEASE_TABLET | Freq: Two times a day (BID) | ORAL | 0 refills | Status: DC

## 2020-03-24 MED ORDER — POTASSIUM CHLORIDE CRYS ER 20 MEQ PO TBCR
40.0000 meq | EXTENDED_RELEASE_TABLET | Freq: Once | ORAL | Status: AC
  Administered 2020-03-24: 40 meq via ORAL
  Filled 2020-03-24: qty 2

## 2020-03-24 NOTE — ED Triage Notes (Signed)
Pt states she fell x 3 days ago and is having head pain; pt states she feels lightheaded; pt states she had a silent seizure and blacked out and when she woke up she was on the floor

## 2020-03-24 NOTE — Discharge Instructions (Addendum)
Potassium was slightly low.  Otherwise all your x-rays were good.  Follow-up with your primary care doctor.  All these tests are available in the computer to anyone you see in the [facility] system

## 2020-03-24 NOTE — ED Provider Notes (Addendum)
[facility] EMERGENCY DEPARTMENT Provider Note   CSN: [id number] Arrival date & time: 03/24/20  4010     History Chief Complaint  Patient presents with  . Judy Cross is a 54 y.o. female.  Patient fell in the bathroom in the middle the night on Sunday.  She wasn't sure if she had a "seizure" or just accidentally fell.  Doesn't remember the event well.  Struck her left face, left temporal area on the sink.  Also complains of left shoulder and left hip pain.  Remote history of "seizure disorder".  She had been on Keppra years ago but not recently.  She is alert and ambulatory in the emergency department.        Past Medical History:  Diagnosis Date  . Allergy   . Anxiety 06/12/2019  . Arthritis   . Asthma in adult, mild intermittent, uncomplicated 2/72/5366  . Breast nodule 10/12/2015  . Breast nodule 10/12/2015  . 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 Endometrial polyp 04/28/2013  . Enlarged uterus 03/26/2013  . Fibroids 04/28/2013  . Hot flashes 03/26/2013  . Hot flashes 03/26/2013  . Hypertension   . Irregular bleeding 03/26/2013  . Neuromuscular disorder (HCC)    sciatica  . Seasonal allergies   . Seizures ([place])    last seizure was 2 years ago; unknown etiology. On Keppra.    Patient Active Problem List   Diagnosis Date Noted  . Encounter for examination following treatment at hospital 02/17/2020  . Mass of left finger   . Anxiety 06/12/2019  . Seizure disorder ([place]) 06/12/2019  . Environmental and seasonal allergies 06/12/2019  . Chronic knee pain 06/12/2019  . Need for immunization against influenza 06/12/2019  . Abnormal Pap smear of cervix 05/27/2019  . Chronic midline low back pain with left-sided sciatica 01/12/2017  . Essential hypertension 12/07/2016  . Mild intermittent asthma without complication 44/08/4740  . Low back pain 12/08/2013  . Fibroids 04/28/2013    Past Surgical History:  Procedure Laterality Date  . ABLATION      with polyp removal.  . CESAREAN SECTION     x 3  . COLONOSCOPY WITH PROPOFOL N/A 01/11/2016   Procedure: COLONOSCOPY WITH PROPOFOL;  Surgeon: Danie Binder, MD;  [facility];  Service: Endoscopy;  Laterality: N/A;  800  . EXAMINATION UNDER ANESTHESIA  02/28/2012   Procedure: EXAM UNDER ANESTHESIA;  Surgeon: Donato Heinz, MD;  Location: [facility] ORS;  Service: General;  Laterality: N/A;  . EXCISION MASS UPPER EXTREMETIES Left 11/13/2019   Procedure: EXCISION MASS UPPER EXTREMETIES ring finger left;  Surgeon: Carole Civil, MD;  Location: [facility] ORS;  Service: Orthopedics;  Laterality: Left;  . GANGLION CYST EXCISION Left   . HYSTEROSCOPY WITH D & C N/A 05/23/2013   Procedure: DILATATION AND CURETTAGE /HYSTEROSCOPY;  Surgeon: Jonnie Kind, MD;  Location: [facility] OR[facility];  Service: Gynecology;  Laterality: N/A;  . POLYPECTOMY N/A 05/23/2013   Procedure: ENDOMETRIAL POLYP REMOVAL;  Surgeon: Jonnie Kind, MD;  Location: [facility];  Service: Gynecology;  Laterality: N/A;  . SPHINCTEROTOMY  02/28/2012   Procedure: SPHINCTEROTOMY;  Surgeon: Donato Heinz, MD;  Location: [facility] ORS;  Service: General;  Laterality: N/A;  Lateral Internal Sphincterotomy  . TRIGGER FINGER RELEASE Left    ring finger  . TUBAL LIGATION       OB History    Gravida  4   Para  3   Term  Preterm      AB  1   Living  3     SAB  1   TAB      Ectopic      Multiple      Live Births  3           Family History  Problem Relation Age of Onset  . Hypertension Mother   . Arthritis Mother   . COPD Mother   . Diabetes Mother   . Heart disease Mother   . Hypertension Father   . Parkinson's disease Father   . Colon cancer Neg Hx     Social History   Tobacco Use  . Smoking status: Former Smoker    Packs/day: 0.25    Years: 20.00    Pack years: 5.00    Types: Cigarettes  . Smokeless tobacco: Never Used  . Tobacco comment: smokes 1 cig daily  Vaping Use  . Vaping Use: Never used  Substance  Use Topics  . Alcohol use: Yes    Comment: occasional  . Drug use: No    Home Medications Prior to Admission medications   Medication Sig Start Date End Date Taking? Authorizing Provider  hydrochlorothiazide (HYDRODIURIL) 25 MG tablet TAKE 1 TABLET BY MOUTH EVERY DAY Patient taking differently: Take 25 mg by mouth daily.  06/16/19  Yes Derrek Monaco A, NP  HYDROcodone-acetaminophen (NORCO/VICODIN) 5-325 MG tablet One tablet every six hours as needed for pain.  Must last 28 days. 03/08/20  Yes [name], MD  ibuprofen (ADVIL) 200 MG tablet Take 600 mg by mouth every 6 (six) hours as needed.    Yes [provider]  PROAIR HFA 108 (90 Base) MCG/ACT inhaler INHALE 1-2 PUFFS INTO THE LUNGS EVERY 6 (SIX) HOURS AS NEEDED FOR WHEEZING OR SHORTNESS OF BREATH 01/06/20  Yes Fayrene Helper, MD  lidocaine (LIDODERM) 5 % Place 1 patch onto the skin daily. Remove & Discard patch within 12 hours or as directed by MD Patient not taking: Reported on 03/24/2020 02/15/20   Triplett, Tammy, PA-C  potassium chloride SA (KLOR-CON) 20 MEQ tablet Take 1 tablet (20 mEq total) by mouth 2 (two) times daily. 03/24/20   Nat Christen, MD    Allergies    Lisinopril, Penicillins, and Tramadol  Review of Systems   Review of Systems  All other systems reviewed and are negative.   Physical Exam Updated Vital Signs BP (!) 128/92 (BP Location: Right Arm)   Temp 98.6 F (37 C) (Oral)   Resp 16   SpO2 98%   Physical Exam Vitals and nursing note reviewed.  Constitutional:      Appearance: She is well-developed.  HENT:     Head: Normocephalic.     Comments: Tender left parietal temporal area and left TMJ area. Eyes:     Conjunctiva/sclera: Conjunctivae normal.  Cardiovascular:     Rate and Rhythm: Normal rate and regular rhythm.  Pulmonary:     Effort: Pulmonary effort is normal.     Breath sounds: Normal breath sounds.  Abdominal:     General: Bowel sounds are normal.     Palpations:  Abdomen is soft.  Musculoskeletal:        General: Normal range of motion.     Cervical back: Neck supple.     Comments: General minimal tenderness surrounding left shoulder and left hip.  Skin:    General: Skin is warm and dry.  Neurological:     General: No focal deficit  present.     Mental Status: She is alert and oriented to person, place, and time.  Psychiatric:        Behavior: Behavior normal.     ED Results / Procedures / Treatments   Labs (all labs ordered are listed, but only abnormal results are displayed) Labs Reviewed  CBC WITH DIFFERENTIAL/PLATELET - Abnormal; Notable for the following components:      Result Value   RBC 3.80 (*)    All other components within normal limits  COMPREHENSIVE METABOLIC PANEL - Abnormal; Notable for the following components:   Potassium 2.7 (*)    Glucose, Bld 111 (*)    All other components within normal limits    EKG None  Radiology CT Head Wo Contrast  Result Date: 03/24/2020 CLINICAL DATA:  Headache, left facial pain after fall 3 days ago EXAM: CT HEAD WITHOUT CONTRAST CT MAXILLOFACIAL WITHOUT CONTRAST TECHNIQUE: Multidetector CT imaging of the head and maxillofacial structures were performed using the standard protocol without intravenous contrast. Multiplanar CT image reconstructions of the maxillofacial structures were also generated. COMPARISON:  05/22/2018 FINDINGS: CT HEAD FINDINGS Brain: No evidence of acute infarction, hemorrhage, hydrocephalus, extra-axial collection or mass lesion/mass effect. Stable prominent perivascular space in the right temporal lobe. Vascular: Atherosclerotic calcifications involving the large vessels of the skull base. No unexpected hyperdense vessel. Skull: Normal. Negative for fracture or focal lesion. Other: None. CT MAXILLOFACIAL FINDINGS Osseous: No acute maxillofacial bone fracture. Bony orbital walls intact. Nasal bones intact. The mandible is intact without fracture or dislocation. Normal  alignment of the temporomandibular joints without significant arthropathy. Orbits: Negative. No traumatic or inflammatory finding. Sinuses: Clear. Soft tissues: No soft tissue hematoma. IMPRESSION: 1. No acute intracranial findings. 2. No acute maxillofacial bone fracture. Electronically Signed   By: Davina Poke D.O.   On: 03/24/2020 10:45   DG Shoulder Left  Result Date: 03/24/2020 CLINICAL DATA:  Fall.  Pain. EXAM: LEFT SHOULDER - 2+ VIEW COMPARISON:  None. FINDINGS: There is no evidence of fracture or dislocation. There is no evidence of arthropathy or other focal bone abnormality. Soft tissues are unremarkable. The visualized left lung is unremarkable. IMPRESSION: No evidence of acute fracture or malalignment. Electronically Signed   By: Margaretha Sheffield MD   On: 03/24/2020 10:30   DG Hip Unilat W or Wo Pelvis 2-3 Views Left  Result Date: 03/24/2020 CLINICAL DATA:  Fall.  Pain. EXAM: DG HIP (WITH OR WITHOUT PELVIS) 2-3V LEFT COMPARISON:  Hip radiographs 02/28/2017 FINDINGS: There is no evidence of hip fracture or dislocation. Mild bilateral hip and pubic symphysis degenerative changes. Partially imaged lower lumbar degenerative change. IMPRESSION: 1. No evidence of acute fracture or malalignment. 2. Mild bilateral hip degenerative change. Electronically Signed   By: Margaretha Sheffield MD   On: 03/24/2020 10:33   CT Maxillofacial Wo Contrast  Result Date: 03/24/2020 CLINICAL DATA:  Headache, left facial pain after fall 3 days ago EXAM: CT HEAD WITHOUT CONTRAST CT MAXILLOFACIAL WITHOUT CONTRAST TECHNIQUE: Multidetector CT imaging of the head and maxillofacial structures were performed using the standard protocol without intravenous contrast. Multiplanar CT image reconstructions of the maxillofacial structures were also generated. COMPARISON:  05/22/2018 FINDINGS: CT HEAD FINDINGS Brain: No evidence of acute infarction, hemorrhage, hydrocephalus, extra-axial collection or mass lesion/mass  effect. Stable prominent perivascular space in the right temporal lobe. Vascular: Atherosclerotic calcifications involving the large vessels of the skull base. No unexpected hyperdense vessel. Skull: Normal. Negative for fracture or focal lesion. Other: None. CT MAXILLOFACIAL FINDINGS  Osseous: No acute maxillofacial bone fracture. Bony orbital walls intact. Nasal bones intact. The mandible is intact without fracture or dislocation. Normal alignment of the temporomandibular joints without significant arthropathy. Orbits: Negative. No traumatic or inflammatory finding. Sinuses: Clear. Soft tissues: No soft tissue hematoma. IMPRESSION: 1. No acute intracranial findings. 2. No acute maxillofacial bone fracture. Electronically Signed   By: Davina Poke D.O.   On: 03/24/2020 10:45    Procedures Procedures (including critical care time)  Medications Ordered in ED Medications  potassium chloride SA (KLOR-CON) CR tablet 40 mEq (40 mEq Oral Given 03/24/20 1048)    ED Course  I have reviewed the triage vital signs and the nursing notes.  Pertinent labs & imaging results that were available during my care of the patient were reviewed by me and considered in my medical decision making (see chart for details).    MDM Rules/Calculators/A&P                          Accidental trip and fall in the middle the night versus possible seizure.  Hard to tease out from history.  Will CT head and maxillofacial area along with plain films of left shoulder and left hip.  Basic labs.  Probable discharge.  1130: Recheck.  Alert.  No neuro deficits.  Discussed test results.  Potassium slightly low.  Will replace.  Follow-up primary care.  Uncertain if this was a seizure or just a fall. Final Clinical Impression(s) / ED Diagnoses Final diagnoses:  Fall, initial encounter  Acute pain of left shoulder  Left hip pain  Hypokalemia    Rx / DC Orders ED Discharge Orders         Ordered    potassium chloride SA  (KLOR-CON) 20 MEQ tablet  2 times daily        03/24/20 1132           Nat Christen, MD 03/24/20 1016    Nat Christen, MD 03/24/20 1134

## 2020-03-24 NOTE — ED Notes (Signed)
Date and time results received: 03/24/20 1030  Test: K Critical Value: 2.7  Name of Provider Notified: Dr. Lacinda Axon  Orders Received? Or Actions Taken?: Notified

## 2020-03-24 NOTE — Telephone Encounter (Signed)
FYI  Pt called back said she waited 2 hours in ER so she left and said she is still getting light headed and dizzy. I advised pt to go back to the ER as she needed to have a CT done and needed to be evaluated. She stated she was getting dressed and she would go as soon as she got up.

## 2020-04-01 ENCOUNTER — Other Ambulatory Visit: Payer: Self-pay

## 2020-04-01 ENCOUNTER — Encounter: Payer: Self-pay | Admitting: Orthopaedic Surgery

## 2020-04-01 ENCOUNTER — Ambulatory Visit (INDEPENDENT_AMBULATORY_CARE_PROVIDER_SITE_OTHER): Payer: Medicaid Other | Admitting: Orthopaedic Surgery

## 2020-04-01 DIAGNOSIS — M25562 Pain in left knee: Secondary | ICD-10-CM | POA: Diagnosis not present

## 2020-04-01 DIAGNOSIS — F17210 Nicotine dependence, cigarettes, uncomplicated: Secondary | ICD-10-CM

## 2020-04-01 DIAGNOSIS — G8929 Other chronic pain: Secondary | ICD-10-CM | POA: Diagnosis not present

## 2020-04-01 NOTE — Progress Notes (Signed)
PROCEDURE NOTE:  The patient requests injections of the left knee , verbal consent was obtained.  The left knee was prepped appropriately after time out was performed.   Sterile technique was observed and injection of 1 cc of Depo-Medrol 40 mg with several cc's of plain xylocaine. Anesthesia was provided by ethyl chloride and a 20-gauge needle was used to inject the knee area. The injection was tolerated well.  A band aid dressing was applied.  The patient was advised to apply ice later today and tomorrow to the injection sight as needed.  Return in six weeks.  Electronically Signed [name], MD 10/21/20217:58 AM

## 2020-04-02 DIAGNOSIS — Z23 Encounter for immunization: Secondary | ICD-10-CM | POA: Diagnosis not present

## 2020-04-06 ENCOUNTER — Other Ambulatory Visit: Payer: Self-pay | Admitting: Family Medicine

## 2020-04-06 ENCOUNTER — Telehealth: Payer: Self-pay | Admitting: Orthopaedic Surgery

## 2020-04-06 DIAGNOSIS — J4520 Mild intermittent asthma, uncomplicated: Secondary | ICD-10-CM

## 2020-04-06 DIAGNOSIS — G8929 Other chronic pain: Secondary | ICD-10-CM

## 2020-04-06 MED ORDER — HYDROCODONE-ACETAMINOPHEN 5-325 MG PO TABS: ORAL_TABLET | ORAL | 0 refills | Status: DC

## 2020-04-06 NOTE — Telephone Encounter (Signed)
Patient called for refill:  HYDROcodone-acetaminophen (NORCO/VICODIN) 5-325 MG tablet 56 tablet  -[facility], [place]

## 2020-05-10 ENCOUNTER — Telehealth: Payer: Self-pay | Admitting: Orthopaedic Surgery

## 2020-05-10 DIAGNOSIS — G8929 Other chronic pain: Secondary | ICD-10-CM

## 2020-05-10 NOTE — Telephone Encounter (Signed)
Patient request refill on pain medicine   HYDROcodone-Acetaminophen (Tab) NORCO/VICODIN 5-325 MG One tablet every six hours as needed for pain. Must last 28 days.     [facility] [place]

## 2020-05-12 MED ORDER — HYDROCODONE-ACETAMINOPHEN 5-325 MG PO TABS: ORAL_TABLET | ORAL | 0 refills | Status: DC

## 2020-05-13 ENCOUNTER — Other Ambulatory Visit: Payer: Self-pay

## 2020-05-13 ENCOUNTER — Ambulatory Visit (INDEPENDENT_AMBULATORY_CARE_PROVIDER_SITE_OTHER): Payer: Medicaid Other | Admitting: Orthopaedic Surgery

## 2020-05-13 ENCOUNTER — Encounter: Payer: Self-pay | Admitting: Orthopaedic Surgery

## 2020-05-13 DIAGNOSIS — M25562 Pain in left knee: Secondary | ICD-10-CM | POA: Diagnosis not present

## 2020-05-13 DIAGNOSIS — G8929 Other chronic pain: Secondary | ICD-10-CM

## 2020-05-13 DIAGNOSIS — F17210 Nicotine dependence, cigarettes, uncomplicated: Secondary | ICD-10-CM

## 2020-05-13 NOTE — Progress Notes (Signed)
PROCEDURE NOTE:  The patient requests injections of the left knee , verbal consent was obtained.  The left knee was prepped appropriately after time out was performed.   Sterile technique was observed and injection of 1 cc of Depo-Medrol 40 mg with several cc's of plain xylocaine. Anesthesia was provided by ethyl chloride and a 20-gauge needle was used to inject the knee area. The injection was tolerated well.  A band aid dressing was applied.  The patient was advised to apply ice later today and tomorrow to the injection sight as needed.  Return in six weeks.  Electronically Signed [name], MD 12/2/20218:05 AM

## 2020-06-06 ENCOUNTER — Other Ambulatory Visit: Payer: Self-pay

## 2020-06-06 ENCOUNTER — Other Ambulatory Visit: Payer: Self-pay | Admitting: Adult Health

## 2020-06-06 ENCOUNTER — Emergency Department (HOSPITAL_COMMUNITY)
Admission: EM | Admit: 2020-06-06 | Discharge: 2020-06-06 | Disposition: A | Discharge status: Home / Self Care | Payer: Medicaid Other | Attending: Emergency Medicine | Admitting: Emergency Medicine

## 2020-06-06 ENCOUNTER — Encounter (HOSPITAL_COMMUNITY): Payer: Self-pay

## 2020-06-06 ENCOUNTER — Emergency Department (HOSPITAL_COMMUNITY): Payer: Medicaid Other

## 2020-06-06 DIAGNOSIS — J45909 Unspecified asthma, uncomplicated: Secondary | ICD-10-CM | POA: Diagnosis not present

## 2020-06-06 DIAGNOSIS — F17210 Nicotine dependence, cigarettes, uncomplicated: Secondary | ICD-10-CM | POA: Insufficient documentation

## 2020-06-06 DIAGNOSIS — Z79899 Other long term (current) drug therapy: Secondary | ICD-10-CM | POA: Diagnosis not present

## 2020-06-06 DIAGNOSIS — I10 Essential (primary) hypertension: Secondary | ICD-10-CM | POA: Insufficient documentation

## 2020-06-06 DIAGNOSIS — Z87828 Personal history of other (healed) physical injury and trauma: Secondary | ICD-10-CM | POA: Diagnosis not present

## 2020-06-06 DIAGNOSIS — X500XXA Overexertion from strenuous movement or load, initial encounter: Secondary | ICD-10-CM | POA: Insufficient documentation

## 2020-06-06 DIAGNOSIS — R0781 Pleurodynia: Secondary | ICD-10-CM | POA: Diagnosis not present

## 2020-06-06 MED ORDER — CYCLOBENZAPRINE HCL 10 MG PO TABS: 10.0000 mg | ORAL_TABLET | Freq: Two times a day (BID) | ORAL | 0 refills | Status: DC | PRN

## 2020-06-06 NOTE — Discharge Instructions (Signed)
You have been seen here for left rib pain.  Prescribed you a muscle relaxer please take as prescribed.,  Please be aware this medication can make you drowsy do not consume alcohol or drive while taking this medication.  Recommend taking this at nighttime.  I recommend taking over-the-counter pain medications like ibuprofen and/or Tylenol every 6 as needed.  Please follow dosage and on the back of bottle.  I also recommend applying heat to the area and stretching out the muscles as this will help decrease stiffness and pain.     Recommend follow-up with your primary care provider for further evaluation.   Come back to the emergency department if you develop chest pain, shortness of breath, severe abdominal pain, uncontrolled nausea, vomiting, diarrhea.

## 2020-06-06 NOTE — ED Triage Notes (Signed)
Pt states when she was working Friday and lifting something she felt something pop on the left side of her ribs. Pt c/o left rib pain since.

## 2020-06-06 NOTE — ED Provider Notes (Signed)
[facility] EMERGENCY DEPARTMENT Provider Note   CSN: [id number] Arrival date & time: 06/06/20  4481     History Chief Complaint  Patient presents with  . Rib Injury    Judy Cross is a 54 y.o. female.  HPI   Patient with no significant medical history presents to the emergency department with chief complaint of left-sided rib pain.  Patient states pain started on Friday after she lift up a mop bucket.  Since then she has had left rib pain which is worsened with movement and/or deep breaths.  She denies chest pain, shortness of breath, cough or congestion.  Patient states she has been applying heat to the area and taking Tylenol without any relief.  She endorses that this happened to her in the past when someone hugged her and she had the same type of pain.  She endorses that she heard a pop like last time.  She denies any alleviating factors.  Patient denies headaches, fevers, chills, shortness of breath, chest pain, abdominal pain, nausea, vomiting, diarrhea, pedal edema.  Past Medical History:  Diagnosis Date  . Allergy   . Anxiety 06/12/2019  . Arthritis   . Asthma in adult, mild intermittent, uncomplicated 8/56/3149  . Breast nodule 10/12/2015  . Breast nodule 10/12/2015  . 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 Endometrial polyp 04/28/2013  . Enlarged uterus 03/26/2013  . Fibroids 04/28/2013  . Hot flashes 03/26/2013  . Hot flashes 03/26/2013  . Hypertension   . Irregular bleeding 03/26/2013  . Neuromuscular disorder (HCC)    sciatica  . Seasonal allergies   . Seizures ([place])    last seizure was 2 years ago; unknown etiology. On Keppra.    Patient Active Problem List   Diagnosis Date Noted  . Encounter for examination following treatment at hospital 02/17/2020  . Mass of left finger   . Anxiety 06/12/2019  . Seizure disorder ([place]) 06/12/2019  . Environmental and seasonal allergies 06/12/2019  . Chronic knee pain 06/12/2019  . Need for immunization against  influenza 06/12/2019  . Abnormal Pap smear of cervix 05/27/2019  . Chronic midline low back pain with left-sided sciatica 01/12/2017  . Essential hypertension 12/07/2016  . Mild intermittent asthma without complication 70/26/3785  . Low back pain 12/08/2013  . Fibroids 04/28/2013    Past Surgical History:  Procedure Laterality Date  . ABLATION     with polyp removal.  . CESAREAN SECTION     x 3  . COLONOSCOPY WITH PROPOFOL N/A 01/11/2016   Procedure: COLONOSCOPY WITH PROPOFOL;  Surgeon: Danie Binder, MD;  Location: [facility];  Service: Endoscopy;  Laterality: N/A;  800  . EXAMINATION UNDER ANESTHESIA  02/28/2012   Procedure: EXAM UNDER ANESTHESIA;  Surgeon: Donato Heinz, MD;  Location: [facility] ORS;  Service: General;  Laterality: N/A;  . EXCISION MASS UPPER EXTREMETIES Left 11/13/2019   Procedure: EXCISION MASS UPPER EXTREMETIES ring finger left;  Surgeon: Carole Civil, MD;  Location: [facility] ORS;  Service: Orthopedics;  Laterality: Left;  . GANGLION CYST EXCISION Left   . HYSTEROSCOPY WITH D & C N/A 05/23/2013   Procedure: DILATATION AND CURETTAGE /HYSTEROSCOPY;  Surgeon: Jonnie Kind, MD;  Location: [facility];  Service: Gynecology;  Laterality: N/A;  . POLYPECTOMY N/A 05/23/2013   Procedure: ENDOMETRIAL POLYP REMOVAL;  Surgeon: Jonnie Kind, MD;  Location: [facility];  Service: Gynecology;  Laterality: N/A;  . SPHINCTEROTOMY  02/28/2012   Procedure: SPHINCTEROTOMY;  Surgeon: Lollie Sails  Leticia Penna, MD;  Location: [facility];  Service: General;  Laterality: N/A;  Lateral Internal Sphincterotomy  . TRIGGER FINGER RELEASE Left    ring finger  . TUBAL LIGATION       OB History    Gravida  4   Para  3   Term      Preterm      AB  1   Living  3     SAB  1   IAB      Ectopic      Multiple      Live Births  3           Family History  Problem Relation Age of Onset  . Hypertension Mother   . Arthritis Mother   . COPD Mother   . Diabetes Mother   . Heart disease Mother    . Hypertension Father   . Parkinson's disease Father   . Colon cancer Neg Hx     Social History   Tobacco Use  . Smoking status: Current Some Day Smoker    Packs/day: 0.25    Years: 20.00    Pack years: 5.00    Types: Cigarettes  . Smokeless tobacco: Never Used  . Tobacco comment: smokes 1 cig daily  Vaping Use  . Vaping Use: Never used  Substance Use Topics  . Alcohol use: Yes    Comment: occasional  . Drug use: No    Home Medications Prior to Admission medications   Medication Sig Start Date End Date Taking? Authorizing Provider  cyclobenzaprine (FLEXERIL) 10 MG tablet Take 1 tablet (10 mg total) by mouth 2 (two) times daily as needed for muscle spasms. 06/06/20   Carroll Sage, PA-C  hydrochlorothiazide (HYDRODIURIL) 25 MG tablet TAKE 1 TABLET BY MOUTH EVERY DAY Patient taking differently: Take 25 mg by mouth daily.  06/16/19   Adline Potter, NP  HYDROcodone-acetaminophen (NORCO/VICODIN) 5-325 MG tablet One tablet every six hours as needed for pain.  Must last 28 days. 05/12/20   Darreld Mclean, MD  ibuprofen (ADVIL) 200 MG tablet Take 600 mg by mouth every 6 (six) hours as needed.     [provider]  lidocaine (LIDODERM) 5 % Place 1 patch onto the skin daily. Remove & Discard patch within 12 hours or as directed by MD 02/15/20   Triplett, Tammy, PA-C  potassium chloride SA (KLOR-CON) 20 MEQ tablet Take 1 tablet (20 mEq total) by mouth 2 (two) times daily. 03/24/20   Donnetta Hutching, MD  PROAIR HFA 108 [id number] Base) MCG/ACT inhaler INHALE 1-2 PUFFS INTO THE LUNGS EVERY 6 (SIX) HOURS AS NEEDED FOR WHEEZING OR SHORTNESS OF BREATH 01/06/20   Kerri Perches, MD    Allergies    Lisinopril, Penicillins, and Tramadol  Review of Systems   Review of Systems  Constitutional: Negative for chills and fever.  HENT: Negative for congestion.   Respiratory: Negative for shortness of breath.   Cardiovascular: Negative for chest pain.  Gastrointestinal: Negative for  abdominal pain.  Genitourinary: Negative for enuresis.  Musculoskeletal: Negative for back pain.       Left-sided rib pain  Skin: Negative for rash.  Neurological: Negative for dizziness.  Hematological: Does not bruise/bleed easily.    Physical Exam Updated Vital Signs BP (!) 112/56   Pulse 68   Temp 98 F (36.7 C) (Oral)   Resp 16   Ht 5' 1.5" (1.562 m)   Wt 56.7 kg   SpO2 98%  BMI 23.24 kg/m   Physical Exam Vitals and nursing note reviewed.  Constitutional:      General: She is not in acute distress.    Appearance: She is not ill-appearing.  HENT:     Head: Normocephalic and atraumatic.     Nose: No congestion.  Eyes:     Conjunctiva/sclera: Conjunctivae normal.  Cardiovascular:     Rate and Rhythm: Normal rate and regular rhythm.     Pulses: Normal pulses.     Heart sounds: No murmur heard. No friction rub. No gallop.   Pulmonary:     Effort: No respiratory distress.     Breath sounds: No wheezing, rhonchi or rales.  Musculoskeletal:     Comments: Patient's rib cage was palpated, she had tenderness to palpation along the mid clavicular line of the fifth rib, there is no crepitus, deformities noted.  She had good rise and fall with respiration.  She is moving all 4 extremities out difficulty.  Skin:    General: Skin is warm and dry.  Neurological:     Mental Status: She is alert.     Comments: Patient is having no difficulty with word finding.  Psychiatric:        Mood and Affect: Mood normal.     ED Results / Procedures / Treatments   Labs (all labs ordered are listed, but only abnormal results are displayed) Labs Reviewed - No data to display  EKG None  Radiology DG Ribs Unilateral W/Chest Left  Result Date: 06/06/2020 CLINICAL DATA:  Left-sided rib pain for 2 days, heard a pop while moving something at work on Friday, recent history of left-sided rib fracture EXAM: LEFT RIBS AND CHEST - 3+ VIEW COMPARISON:  02/15/2020 FINDINGS: No fracture or  other bone lesions are seen involving the ribs. There is no evidence of pneumothorax or pleural effusion. Both lungs are clear. Heart size and mediastinal contours are within normal limits. IMPRESSION: 1. No displaced rib fracture or other radiographic abnormality to explain pain. 2.  No acute abnormality of the lungs. Electronically Signed   By: Eddie Candle M.D.   On: 06/06/2020 09:43    Procedures Procedures (including critical care time)  Medications Ordered in ED Medications - No data to display  ED Course  I have reviewed the triage vital signs and the nursing notes.  Pertinent labs & imaging results that were available during my care of the patient were reviewed by me and considered in my medical decision making (see chart for details).    MDM Rules/Calculators/A&P                          Patient presents with left rib pain.  She is alert, does not appear in acute distress, vital signs reassuring.  Will obtain x-ray for further evaluation.  X-ray was unremarkable.  I have low suspicion for ACS as history is atypical, patient has no cardiac history, patient endorses rib pain and not chest pain, no signs of hypoperfusion fluid overload on exam, vital signs reassuring.  low suspicion for PE as patient denies pleuritic chest pain, shortness of breath, vital signs reassuring. low suspicion for rib fracture or dislocation as x-ray is is unremarkable.  Low suspicion for systemic infection as patient is nontoxic-appearing, vital signs reassuring, no obvious source infection noted on exam.  I suspect patient has a muscular strain or possible contusion along the rib, will recommend over-the-counter pain medications, heat and follow-up PCP for  further evaluation.  Vital signs have remained stable, no indication for hospital admission.  Patient given at home care as well strict return precautions.  Patient verbalized that they understood agreed to said plan.   Final Clinical Impression(s) / ED  Diagnoses Final diagnoses:  Rib pain on left side    Rx / DC Orders ED Discharge Orders         Ordered    cyclobenzaprine (FLEXERIL) 10 MG tablet  2 times daily PRN        06/06/20 1025           Aron Baba 06/06/20 1035    Fredia Sorrow, MD 06/19/20 2355

## 2020-06-09 ENCOUNTER — Other Ambulatory Visit: Payer: Self-pay | Admitting: Orthopaedic Surgery

## 2020-06-09 DIAGNOSIS — M25562 Pain in left knee: Secondary | ICD-10-CM

## 2020-06-09 DIAGNOSIS — G8929 Other chronic pain: Secondary | ICD-10-CM

## 2020-06-09 NOTE — Telephone Encounter (Signed)
Hydrocodone-Acetaminophen 5/325 MG  Take 1 tablet every 6(six)huurs as needed for pain.  [facility]

## 2020-06-09 NOTE — Telephone Encounter (Signed)
Patient of Dr Sanjuan Dame requests refill on Hydrocodone/Acetaminophen 5-325 mgs.  [facility] uses [facility]

## 2020-06-09 NOTE — Telephone Encounter (Signed)
Send the other way thru the pharmacy 

## 2020-06-10 ENCOUNTER — Ambulatory Visit (INDEPENDENT_AMBULATORY_CARE_PROVIDER_SITE_OTHER): Payer: Medicaid Other | Admitting: Family Medicine

## 2020-06-10 ENCOUNTER — Encounter: Payer: Self-pay | Admitting: Family Medicine

## 2020-06-10 ENCOUNTER — Other Ambulatory Visit: Payer: Self-pay

## 2020-06-10 VITALS — BP 122/64 | HR 84 | Temp 98.4°F | Ht 61.5 in | Wt 118.0 lb

## 2020-06-10 DIAGNOSIS — R0782 Intercostal pain: Secondary | ICD-10-CM

## 2020-06-10 DIAGNOSIS — Z09 Encounter for follow-up examination after completed treatment for conditions other than malignant neoplasm: Secondary | ICD-10-CM | POA: Diagnosis not present

## 2020-06-10 DIAGNOSIS — I10 Essential (primary) hypertension: Secondary | ICD-10-CM

## 2020-06-10 MED ORDER — IBUPROFEN 600 MG PO TABS: 600.0000 mg | ORAL_TABLET | Freq: Three times a day (TID) | ORAL | 0 refills | Status: DC | PRN

## 2020-06-10 MED ORDER — HYDROCODONE-ACETAMINOPHEN 5-325 MG PO TABS: ORAL_TABLET | ORAL | 0 refills | Status: DC

## 2020-06-10 NOTE — Assessment & Plan Note (Signed)
Appears to have some intercostal pain as ibuprofen is very beneficial new script This is the 2-3rd time she has had rib pain this year, might need referral to ortho-in future if it continues  Reviewed side effects, risks and benefits of medication.   Patient acknowledged agreement and understanding of the plan.

## 2020-06-10 NOTE — Progress Notes (Signed)
Subjective:  Patient ID: Judy Cross, female    DOB: 20-Jun-1965  Age: 54 y.o. MRN: [id number]  CC:  Chief Complaint  Patient presents with  . Breast Pain    L breast bone is hurting x1 week      HPI  HPI Ms Kosak is a 54 year old female patient that presents today after having been seen in ED in 06/06/20 for rib cage pain- left sided.    She reported the pain started on Friday 12/25 after she lift up a mop bucket.  Since then she had had left rib pain which is worsened with movement and/or deep breaths.  She denied chest pain, shortness of breath, cough or congestion.  Stated she had been applying heat to the area and taking Tylenol without any relief.  She endorsed that this happened to her in the past when someone hugged her and she had the same type of pain.  She endorsed that she heard a pop like last time.  She denies any alleviating factors.  Rib cage xray normal and negative for findings. Provided with flexeril which she states she has not started. Has been using ibuprofen 600 mg that she had left at home. Reports some improvement with this. Pain is more central in nature now and up under the breast, but overall she notes improvement.  Patient denies headaches, fevers, chills, shortness of breath, chest pain, abdominal pain, nausea, vomiting, diarrhea, pedal edema.     Today patient denies signs and symptoms of COVID 19 infection including fever, chills, cough, shortness of breath, and headache. Past Medical, Surgical, Social History, Allergies, and Medications have been Reviewed.   Past Medical History:  Diagnosis Date  . Allergy   . Anxiety 06/12/2019  . Arthritis   . Asthma in adult, mild intermittent, uncomplicated 12/07/2016  . Breast nodule 10/12/2015  . Breast nodule 10/12/2015  . 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 Endometrial polyp 04/28/2013  . Enlarged uterus 03/26/2013  . Fibroids 04/28/2013  . Hot flashes 03/26/2013  . Hot flashes 03/26/2013   . Hypertension   . Irregular bleeding 03/26/2013  . Neuromuscular disorder (HCC)    sciatica  . Seasonal allergies   . Seizures (HCC)    last seizure was 2 years ago; unknown etiology. On Keppra.    Current Meds  Medication Sig  . cyclobenzaprine (FLEXERIL) 10 MG tablet Take 1 tablet (10 mg total) by mouth 2 (two) times daily as needed for muscle spasms.  . hydrochlorothiazide (HYDRODIURIL) 25 MG tablet TAKE 1 TABLET BY MOUTH EVERY DAY  . HYDROcodone-acetaminophen (NORCO/VICODIN) 5-325 MG tablet One tablet every six hours as needed for pain.  Must last 28 days.  Marland Kitchen lidocaine (LIDODERM) 5 % Place 1 patch onto the skin daily. Remove & Discard patch within 12 hours or as directed by MD  . potassium chloride SA (KLOR-CON) 20 MEQ tablet Take 1 tablet (20 mEq total) by mouth 2 (two) times daily.  Marland Kitchen PROAIR HFA 108 (90 Base) MCG/ACT inhaler INHALE 1-2 PUFFS INTO THE LUNGS EVERY 6 (SIX) HOURS AS NEEDED FOR WHEEZING OR SHORTNESS OF BREATH  . [DISCONTINUED] ibuprofen (ADVIL) 200 MG tablet Take 600 mg by mouth every 6 (six) hours as needed.     ROS:  Review of Systems  HENT: Negative.   Eyes: Negative.   Respiratory: Negative.   Cardiovascular: Negative.        Left rib pain as per hpi  Gastrointestinal: Negative.  Genitourinary: Negative.   Musculoskeletal: Negative.   Skin: Negative.   Neurological: Negative.   Endo/Heme/Allergies: Negative.   Psychiatric/Behavioral: Negative.      Objective:   Today's Vitals: BP 122/64 (BP Location: Right Arm, Patient Position: Sitting, Cuff Size: Normal)   Pulse 84   Temp 98.4 F (36.9 C) (Temporal)   Ht 5' 1.5" (1.562 m)   Wt 118 lb (53.5 kg)   SpO2 99%   BMI 21.93 kg/m  Vitals with BMI 06/10/2020 06/06/2020 06/06/2020  Height 5' 1.5" - 5' 1.5"  Weight 118 lbs - 125 lbs  BMI 21.94 - 23.24  Systolic 122 112 376  Diastolic 64 56 87  Pulse 84 68 70     Physical Exam Vitals and nursing note reviewed.  Constitutional:       Appearance: Normal appearance. She is obese.  HENT:     Head: Normocephalic and atraumatic.     Right Ear: External ear normal.     Left Ear: External ear normal.     Mouth/Throat:     Comments: Mask in place  Eyes:     General:        Right eye: No discharge.        Left eye: No discharge.     Conjunctiva/sclera: Conjunctivae normal.  Cardiovascular:     Rate and Rhythm: Normal rate and regular rhythm.     Pulses: Normal pulses.     Heart sounds: Normal heart sounds.  Pulmonary:     Effort: Pulmonary effort is normal.     Breath sounds: Normal breath sounds.  Musculoskeletal:     Cervical back: Normal range of motion and neck supple.     Comments: Rib cage was palpated, she had tenderness to palpation along the mid clavicular line of the fifth rib across up under breast on left side, there is no crepitus, deformities noted.  She had good rise and fall with respiration.  She is moving all 4 extremities out difficulty.   Skin:    General: Skin is warm.  Neurological:     General: No focal deficit present.     Mental Status: She is alert and oriented to person, place, and time.  Psychiatric:        Mood and Affect: Mood normal.        Behavior: Behavior normal.        Thought Content: Thought content normal.        Judgment: Judgment normal.     Assessment   1. Encounter for examination following treatment at hospital   2. Essential hypertension   3. Intercostal pain     Tests ordered No orders of the defined types were placed in this encounter.    Plan: Please see assessment and plan per problem list above.   Meds ordered this encounter  Medications  . ibuprofen (ADVIL) 600 MG tablet    Sig: Take 1 tablet (600 mg total) by mouth every 8 (eight) hours as needed for moderate pain.    Dispense:  30 tablet    Refill:  0    Order Specific Question:   Supervising Provider    Answer:   Kerri Perches [[id number]]    Patient to follow-up in 6 months for CPE     Freddy Finner, NP

## 2020-06-10 NOTE — Assessment & Plan Note (Signed)
Judy Cross is encouraged to maintain a well balanced diet that is low in salt. Controlled, continue current medication regimen.   Additionally, she is also reminded that exercise is beneficial for heart health and control of  Blood pressure. 30-60 minutes daily is recommended-walking was suggested.

## 2020-06-10 NOTE — Patient Instructions (Addendum)
I appreciate the opportunity to provide you with care for your health and wellness. Today we discussed: breast bone pain  Follow up: 6 months for CPE -fasting labs same day   No labs or referrals today  Take ibuprofen as needed- should only need it for a week or so more.  Have a good New Year!  Please continue to practice social distancing to keep you, your family, and our community safe.  If you must go out, please wear a mask and practice good handwashing.  It was a pleasure to see you and I look forward to continuing to work together on your health and well-being. Please do not hesitate to call the office if you need care or have questions about your care.  Have a wonderful day. With Gratitude, Tereasa Coop, DNP, AGNP-BC

## 2020-06-10 NOTE — Assessment & Plan Note (Signed)
Seen in ED on 12/26.  Reviewed notes, orders, test and chart No new findings or follow ups needed. Ribcage/intercostal pain- ibuprofen is helping a lot- new script provided  Reviewed side effects, risks and benefits of medication.   Patient acknowledged agreement and understanding of the plan.

## 2020-06-17 ENCOUNTER — Telehealth: Payer: Self-pay

## 2020-06-17 ENCOUNTER — Other Ambulatory Visit: Payer: Self-pay | Admitting: Family Medicine

## 2020-06-17 DIAGNOSIS — J4520 Mild intermittent asthma, uncomplicated: Secondary | ICD-10-CM

## 2020-06-17 MED ORDER — ALBUTEROL SULFATE HFA 108 (90 BASE) MCG/ACT IN AERS: INHALATION_SPRAY | RESPIRATORY_TRACT | 0 refills | Status: DC

## 2020-06-17 NOTE — Telephone Encounter (Signed)
Done - thank you.

## 2020-06-17 NOTE — Telephone Encounter (Signed)
Patient [facility] ph# [phone]

## 2020-06-24 ENCOUNTER — Ambulatory Visit (INDEPENDENT_AMBULATORY_CARE_PROVIDER_SITE_OTHER): Payer: Medicaid Other | Admitting: Orthopaedic Surgery

## 2020-06-24 ENCOUNTER — Other Ambulatory Visit: Payer: Self-pay

## 2020-06-24 ENCOUNTER — Encounter: Payer: Self-pay | Admitting: Orthopaedic Surgery

## 2020-06-24 DIAGNOSIS — M25562 Pain in left knee: Secondary | ICD-10-CM

## 2020-06-24 DIAGNOSIS — G8929 Other chronic pain: Secondary | ICD-10-CM

## 2020-06-24 DIAGNOSIS — F17210 Nicotine dependence, cigarettes, uncomplicated: Secondary | ICD-10-CM

## 2020-06-24 NOTE — Progress Notes (Signed)
PROCEDURE NOTE:  The patient requests injections of the left knee , verbal consent was obtained.  The left knee was prepped appropriately after time out was performed.   Sterile technique was observed and injection of 1 cc of Depo-Medrol 40 mg with several cc's of plain xylocaine. Anesthesia was provided by ethyl chloride and a 20-gauge needle was used to inject the knee area. The injection was tolerated well.  A band aid dressing was applied.  The patient was advised to apply ice later today and tomorrow to the injection sight as needed.  Return in six weeks.  Electronically Signed [name], MD 1/13/20228:04 AM

## 2020-07-01 ENCOUNTER — Other Ambulatory Visit: Payer: Self-pay

## 2020-07-01 ENCOUNTER — Other Ambulatory Visit: Payer: Medicaid Other

## 2020-07-01 DIAGNOSIS — Z20822 Contact with and (suspected) exposure to covid-19: Secondary | ICD-10-CM

## 2020-07-03 LAB — SARS-COV-2, NAA 2 DAY TAT

## 2020-07-03 LAB — NOVEL CORONAVIRUS, NAA: SARS-CoV-2, NAA: NOT DETECTED

## 2020-07-07 ENCOUNTER — Telehealth: Payer: Self-pay | Admitting: Orthopaedic Surgery

## 2020-07-07 ENCOUNTER — Telehealth: Payer: Self-pay | Admitting: *Deleted

## 2020-07-07 DIAGNOSIS — G8929 Other chronic pain: Secondary | ICD-10-CM

## 2020-07-07 DIAGNOSIS — M25562 Pain in left knee: Secondary | ICD-10-CM

## 2020-07-07 MED ORDER — HYDROCODONE-ACETAMINOPHEN 5-325 MG PO TABS: ORAL_TABLET | ORAL | 0 refills | Status: DC

## 2020-07-07 NOTE — Telephone Encounter (Signed)
Patient called and knows it's 2 days early but she wants to request a refill for her pain medicine   HYDROcodone-Acetaminophen (Tab) NORCO/VICODIN 5-325 MG

## 2020-07-07 NOTE — Telephone Encounter (Signed)
Pt notified of negative COVID-19 results. Understanding verbalized.   

## 2020-07-08 ENCOUNTER — Other Ambulatory Visit: Payer: Self-pay | Admitting: Family Medicine

## 2020-07-08 DIAGNOSIS — J4520 Mild intermittent asthma, uncomplicated: Secondary | ICD-10-CM

## 2020-07-12 ENCOUNTER — Telehealth: Payer: Self-pay

## 2020-07-12 DIAGNOSIS — G8929 Other chronic pain: Secondary | ICD-10-CM

## 2020-07-12 DIAGNOSIS — M25562 Pain in left knee: Secondary | ICD-10-CM

## 2020-07-12 NOTE — Telephone Encounter (Signed)
Patient called stating that her pharmacy ([facility]) told her that Medicaid will only pay for a five day supply on pain medication. I told her that I would let you know and that you may have to rewrite the script and send it in again.

## 2020-07-12 NOTE — Telephone Encounter (Signed)
It seems Medicaid will no longer honor any pain medicine more than 5 days.  I have been giving her 43 a month.  She will need to get from primary care doctor.  I will not be filling every five days.

## 2020-07-13 MED ORDER — HYDROCODONE-ACETAMINOPHEN 5-325 MG PO TABS: ORAL_TABLET | ORAL | 0 refills | Status: DC

## 2020-07-13 NOTE — Telephone Encounter (Signed)
Spoke with Judy Cross and she is ok with a 5 day supply. I told her that she would need to see her PCP if she wanted more than a week supply. She kept saying the 5 day supply would be fine with her.   Please advise

## 2020-07-27 ENCOUNTER — Telehealth: Payer: Self-pay | Admitting: Orthopaedic Surgery

## 2020-07-27 ENCOUNTER — Other Ambulatory Visit: Payer: Self-pay | Admitting: Family Medicine

## 2020-07-27 DIAGNOSIS — J4520 Mild intermittent asthma, uncomplicated: Secondary | ICD-10-CM

## 2020-07-27 DIAGNOSIS — G8929 Other chronic pain: Secondary | ICD-10-CM

## 2020-07-27 NOTE — Telephone Encounter (Signed)
Patient called and left voicemail stating she needs a refill on her pain medicine    HYDROcodone-acetaminophen (NORCO/VICODIN) 5-325 MG tablet

## 2020-07-28 MED ORDER — HYDROCODONE-ACETAMINOPHEN 5-325 MG PO TABS: ORAL_TABLET | ORAL | 0 refills | Status: DC

## 2020-08-04 ENCOUNTER — Telehealth: Payer: Self-pay | Admitting: Orthopaedic Surgery

## 2020-08-04 NOTE — Telephone Encounter (Signed)
Patient called request refill on her pain medicine   HYDROcodone-Acetaminophen (Tab) NORCO/VICODIN 5-325 MG     [facility] [place]

## 2020-08-05 ENCOUNTER — Ambulatory Visit (INDEPENDENT_AMBULATORY_CARE_PROVIDER_SITE_OTHER): Payer: Medicaid Other | Admitting: Orthopaedic Surgery

## 2020-08-05 ENCOUNTER — Other Ambulatory Visit: Payer: Self-pay

## 2020-08-05 ENCOUNTER — Encounter: Payer: Self-pay | Admitting: Orthopaedic Surgery

## 2020-08-05 DIAGNOSIS — G8929 Other chronic pain: Secondary | ICD-10-CM

## 2020-08-05 DIAGNOSIS — F17210 Nicotine dependence, cigarettes, uncomplicated: Secondary | ICD-10-CM

## 2020-08-05 DIAGNOSIS — M25562 Pain in left knee: Secondary | ICD-10-CM

## 2020-08-05 MED ORDER — HYDROCODONE-ACETAMINOPHEN 5-325 MG PO TABS
ORAL_TABLET | ORAL | 0 refills | Status: DC
Start: 2020-08-05 — End: 2020-08-12

## 2020-08-05 NOTE — Progress Notes (Signed)
PROCEDURE NOTE:  The patient requests injections of the left knee , verbal consent was obtained.  The left knee was prepped appropriately after time out was performed.   Sterile technique was observed and injection of 1 cc of [place] 6 mg with several cc's of plain xylocaine. Anesthesia was provided by ethyl chloride and a 20-gauge needle was used to inject the knee area. The injection was tolerated well.  A band aid dressing was applied.  The patient was advised to apply ice later today and tomorrow to the injection sight as needed.  Return in six weeks.  Electronically Signed [name], MD 2/24/20228:09 AM

## 2020-08-05 NOTE — Patient Instructions (Signed)

## 2020-08-12 ENCOUNTER — Telehealth: Payer: Self-pay | Admitting: Orthopaedic Surgery

## 2020-08-12 MED ORDER — HYDROCODONE-ACETAMINOPHEN 5-325 MG PO TABS
ORAL_TABLET | ORAL | 0 refills | Status: DC
Start: 2020-08-12 — End: 2020-08-18

## 2020-08-12 NOTE — Telephone Encounter (Signed)
Patient requests refill: HYDROcodone-acetaminophen (NORCO/VICODIN) 5-325 MG tablet 20 tablet  [facility] [place]   

## 2020-08-18 ENCOUNTER — Telehealth: Payer: Self-pay | Admitting: Orthopaedic Surgery

## 2020-08-18 MED ORDER — HYDROCODONE-ACETAMINOPHEN 5-325 MG PO TABS
ORAL_TABLET | ORAL | 0 refills | Status: DC
Start: 2020-08-18 — End: 2020-09-06

## 2020-08-18 NOTE — Telephone Encounter (Signed)
Patient requests refill: HYDROcodone-acetaminophen (NORCO/VICODIN) 5-325 MG tablet 20 tablet  [facility] [place]   

## 2020-08-19 ENCOUNTER — Other Ambulatory Visit: Payer: Self-pay | Admitting: Nurse Practitioner

## 2020-08-19 DIAGNOSIS — J4520 Mild intermittent asthma, uncomplicated: Secondary | ICD-10-CM

## 2020-09-06 ENCOUNTER — Telehealth: Payer: Self-pay | Admitting: Orthopaedic Surgery

## 2020-09-06 MED ORDER — HYDROCODONE-ACETAMINOPHEN 5-325 MG PO TABS
ORAL_TABLET | ORAL | 0 refills | Status: DC
Start: 2020-09-06 — End: 2020-09-14

## 2020-09-06 NOTE — Addendum Note (Signed)
Addended by: Willette Pa on: 09/06/2020 09:13 PM   Modules accepted: Orders

## 2020-09-06 NOTE — Telephone Encounter (Signed)
Patient requests refill:  HYDROcodone-acetaminophen (NORCO/VICODIN) 5-325 MG tablet 20 tablet  [facility] [place]

## 2020-09-13 ENCOUNTER — Telehealth: Payer: Self-pay | Admitting: Orthopaedic Surgery

## 2020-09-13 NOTE — Telephone Encounter (Signed)
Patient requests refill: HYDROcodone-acetaminophen (NORCO/VICODIN) 5-325 MG tablet 20 tablet  [facility] [place]

## 2020-09-14 ENCOUNTER — Other Ambulatory Visit (HOSPITAL_COMMUNITY): Payer: Self-pay | Admitting: Family Medicine

## 2020-09-14 DIAGNOSIS — Z1231 Encounter for screening mammogram for malignant neoplasm of breast: Secondary | ICD-10-CM

## 2020-09-14 MED ORDER — HYDROCODONE-ACETAMINOPHEN 5-325 MG PO TABS
ORAL_TABLET | ORAL | 0 refills | Status: DC
Start: 2020-09-14 — End: 2020-09-21

## 2020-09-16 ENCOUNTER — Ambulatory Visit (INDEPENDENT_AMBULATORY_CARE_PROVIDER_SITE_OTHER): Payer: Medicaid Other | Admitting: Orthopaedic Surgery

## 2020-09-16 ENCOUNTER — Encounter: Payer: Self-pay | Admitting: Orthopaedic Surgery

## 2020-09-16 ENCOUNTER — Other Ambulatory Visit: Payer: Self-pay

## 2020-09-16 DIAGNOSIS — G8929 Other chronic pain: Secondary | ICD-10-CM

## 2020-09-16 DIAGNOSIS — F17210 Nicotine dependence, cigarettes, uncomplicated: Secondary | ICD-10-CM

## 2020-09-16 DIAGNOSIS — M25562 Pain in left knee: Secondary | ICD-10-CM

## 2020-09-16 NOTE — Progress Notes (Signed)
PROCEDURE NOTE:  The patient requests injections of the left knee , verbal consent was obtained.  The left knee was prepped appropriately after time out was performed.   Sterile technique was observed and injection of 1 cc of Celestone 6 mg with several cc's of plain xylocaine. Anesthesia was provided by ethyl chloride and a 20-gauge needle was used to inject the knee area. The injection was tolerated well.  A band aid dressing was applied.  The patient was advised to apply ice later today and tomorrow to the injection sight as needed.  Return in one month.  Electronically Signed [name], MD 4/7/20228:11 AM

## 2020-09-21 ENCOUNTER — Telehealth: Payer: Self-pay | Admitting: Orthopaedic Surgery

## 2020-09-21 MED ORDER — HYDROCODONE-ACETAMINOPHEN 5-325 MG PO TABS: ORAL_TABLET | ORAL | 0 refills | Status: DC

## 2020-09-21 NOTE — Telephone Encounter (Signed)
Patient requests refill: HYDROcodone-acetaminophen (NORCO/VICODIN) 5-325 MG tablet 20 tablet  [facility] [place]

## 2020-10-04 ENCOUNTER — Telehealth: Payer: Self-pay | Admitting: Orthopaedic Surgery

## 2020-10-04 ENCOUNTER — Ambulatory Visit (HOSPITAL_COMMUNITY)
Admission: RE | Admit: 2020-10-04 | Discharge: 2020-10-04 | Disposition: A | Discharge status: Home / Self Care | Payer: Medicaid Other | Source: Ambulatory Visit | Attending: Family Medicine | Admitting: Family Medicine

## 2020-10-04 ENCOUNTER — Encounter (HOSPITAL_COMMUNITY): Payer: Self-pay

## 2020-10-04 DIAGNOSIS — Z1231 Encounter for screening mammogram for malignant neoplasm of breast: Secondary | ICD-10-CM | POA: Diagnosis not present

## 2020-10-04 MED ORDER — HYDROCODONE-ACETAMINOPHEN 5-325 MG PO TABS: ORAL_TABLET | ORAL | 0 refills | Status: DC

## 2020-10-04 NOTE — Telephone Encounter (Signed)
Patient called and states she wants her pain medicine and she knows she will have to pay for it.     HYDROcodone-acetaminophen (NORCO/VICODIN) 5-325 MG tablet   [facility] [place]

## 2020-10-11 MED ORDER — HYDROCODONE-ACETAMINOPHEN 5-325 MG PO TABS
ORAL_TABLET | ORAL | 0 refills | Status: DC
Start: 2020-10-11 — End: 2020-10-21

## 2020-10-11 NOTE — Telephone Encounter (Signed)
Patient called and requested refill for her pain medicine   HYDROcodone-acetaminophen (NORCO/VICODIN) 5-325 MG tablet   [facility] [place]

## 2020-10-12 ENCOUNTER — Ambulatory Visit (INDEPENDENT_AMBULATORY_CARE_PROVIDER_SITE_OTHER): Payer: Medicaid Other | Admitting: Women's Health

## 2020-10-12 ENCOUNTER — Other Ambulatory Visit (HOSPITAL_COMMUNITY)
Admission: RE | Admit: 2020-10-12 | Discharge: 2020-10-12 | Disposition: A | Discharge status: Home / Self Care | Payer: Medicaid Other | Source: Ambulatory Visit | Attending: Obstetrics & Gynecology | Admitting: Obstetrics & Gynecology

## 2020-10-12 ENCOUNTER — Other Ambulatory Visit: Payer: Self-pay

## 2020-10-12 ENCOUNTER — Encounter: Payer: Self-pay | Admitting: Women's Health

## 2020-10-12 VITALS — BP 123/81 | HR 78 | Ht 61.0 in | Wt 117.2 lb

## 2020-10-12 DIAGNOSIS — Z01419 Encounter for gynecological examination (general) (routine) without abnormal findings: Secondary | ICD-10-CM | POA: Diagnosis not present

## 2020-10-12 DIAGNOSIS — R399 Unspecified symptoms and signs involving the genitourinary system: Secondary | ICD-10-CM | POA: Diagnosis not present

## 2020-10-12 LAB — POCT URINALYSIS DIPSTICK OB
Blood, UA: NEGATIVE
Glucose, UA: NEGATIVE
Ketones, UA: NEGATIVE
Leukocytes, UA: NEGATIVE
Nitrite, UA: NEGATIVE
POC,PROTEIN,UA: NEGATIVE

## 2020-10-12 NOTE — Progress Notes (Signed)
WELL-WOMAN EXAMINATION Patient name: Judy Cross MRN [id number]  Date of birth: December 18, 1965 Chief Complaint:   Gynecologic Exam  History of Present Illness:   Judy Cross is a 55 y.o. [phone] African American female being seen today for a routine well-woman exam.  Current complaints: discomfort when urinating sometimes  Depression screen [facility] 2/9 10/12/2020 06/10/2020 03/23/2020 02/17/2020 06/12/2019  Decreased Interest 3 1 2  0 0  Down, Depressed, Hopeless 3 3 2 1  0  PHQ - 2 Score 6 4 4 1  0  Altered sleeping 2 3 2  - -  Tired, decreased energy 2 3 2  - -  Change in appetite 1 3 2  - -  Feeling bad or failure about yourself  1 0 2 - -  Trouble concentrating 0 1 2 - -  Moving slowly or fidgety/restless 0 1 2 - -  Suicidal thoughts 0 0 0 - -  PHQ-9 Score 12 15 16  - -  Difficult doing work/chores - Somewhat difficult Very difficult - -  Some recent data might be hidden     PCP: Cherly [facility]      does not desire labs, does w/ PCP No LMP recorded. Patient has had an ablation. The current method of family planning is tubal ligation.  Last pap 05/20/19. Results were: LSIL w/ HRHPV positive. H/O abnormal pap: yes Last mammogram: 10/04/20. Results were: normal. Family h/o breast cancer: yes MA Last colonoscopy: 01/11/16. Results were:few diverticula, internal hemorrhoids, repeat in 40yrs. Family h/o colorectal cancer: yes Dad in 74s Review of Systems:   Pertinent items are noted in HPI Denies any headaches, blurred vision, fatigue, shortness of breath, chest pain, abdominal pain, abnormal vaginal discharge/itching/odor/irritation, problems with periods, bowel movements, urination, or intercourse unless otherwise stated above. Pertinent History Reviewed:  Reviewed past medical,surgical, social and family history.  Reviewed problem list, medications and allergies. Physical Assessment:   Vitals:   10/12/20 1044  BP: 123/81  Pulse: 78  Weight: 117 lb 3.2 oz (53.2 kg)  Height: 5\' 1"  (1.549  m)  Body mass index is 22.14 kg/m.        Physical Examination:   General appearance - well appearing, and in no distress  Mental status - alert, oriented to person, place, and time  Psych:  She has a normal mood and affect  Skin - warm and dry, normal color, no suspicious lesions noted  Chest - effort normal, all lung fields clear to auscultation bilaterally  Heart - normal rate and regular rhythm  Neck:  midline trachea, no thyromegaly or nodules  Breasts - breasts appear normal, no suspicious masses, no skin or nipple changes or  axillary nodes  Abdomen - soft, nontender, nondistended, no masses or organomegaly  Pelvic - VULVA: normal appearing vulva with no masses, tenderness or lesions  VAGINA: normal appearing vagina with normal color and discharge, no lesions  CERVIX: normal appearing cervix without discharge or lesions, no CMT  Thin prep pap is done w/ HR HPV cotesting  UTERUS: uterus is felt to be normal size, shape, consistency and nontender   ADNEXA: No adnexal masses or tenderness noted.  Extremities:  No swelling or varicosities noted  Urine dipstick: neg for all per RN  Chaperone: Marcelino Scot    No results found for this or any previous visit (from the past 24 hour(s)).  Assessment & Plan:  1) Well-Woman Exam  2) Occ discomfort w/ urination> urine dipstick neg, will see if pap shows any vaginitis  3) H/O abnormal  pap> repeat today  Labs/procedures today: pap  Mammogram: in 1 year, or sooner if problems Colonoscopy: per GI, or sooner if problems  Orders Placed This Encounter  Procedures  . POC Urinalysis Dipstick OB    Meds: No orders of the defined types were placed in this encounter.   Follow-up: Return in about 1 year (around 10/12/2021) for Physical.  [place], WHNP-BC 10/12/2020 11:17 AM

## 2020-10-14 ENCOUNTER — Ambulatory Visit (INDEPENDENT_AMBULATORY_CARE_PROVIDER_SITE_OTHER): Payer: Medicaid Other | Admitting: Orthopaedic Surgery

## 2020-10-14 ENCOUNTER — Other Ambulatory Visit: Payer: Self-pay

## 2020-10-14 ENCOUNTER — Encounter: Payer: Self-pay | Admitting: Orthopaedic Surgery

## 2020-10-14 DIAGNOSIS — G8929 Other chronic pain: Secondary | ICD-10-CM | POA: Diagnosis not present

## 2020-10-14 DIAGNOSIS — M25562 Pain in left knee: Secondary | ICD-10-CM | POA: Diagnosis not present

## 2020-10-14 LAB — CYTOLOGY - PAP
Comment: NEGATIVE
Diagnosis: NEGATIVE
High risk HPV: NEGATIVE

## 2020-10-14 NOTE — Progress Notes (Signed)
PROCEDURE NOTE:  The patient requests injections of the left knee , verbal consent was obtained.  The left knee was prepped appropriately after time out was performed.   Sterile technique was observed and injection of 1 cc of Celestone 6 mg with several cc's of plain xylocaine. Anesthesia was provided by ethyl chloride and a 20-gauge needle was used to inject the knee area. The injection was tolerated well.  A band aid dressing was applied.  The patient was advised to apply ice later today and tomorrow to the injection sight as needed.  Return in one month.  Call if any problem.  Precautions discussed.   Electronically Signed [name], MD 5/5/20228:03 AM

## 2020-10-19 ENCOUNTER — Telehealth: Payer: Self-pay | Admitting: Orthopaedic Surgery

## 2020-10-19 NOTE — Telephone Encounter (Signed)
Per Dr. Luna Glasgow he will fill on Thursday and patient is aware.

## 2020-10-19 NOTE — Telephone Encounter (Signed)
Patient called request refill on her pain medicine   HYDROcodone-acetaminophen (NORCO/VICODIN) 5-325 MG tablet  Pharmacy:  [place]

## 2020-10-20 ENCOUNTER — Other Ambulatory Visit: Payer: Self-pay | Admitting: Women's Health

## 2020-10-20 DIAGNOSIS — A599 Trichomoniasis, unspecified: Secondary | ICD-10-CM

## 2020-10-20 HISTORY — DX: Trichomoniasis, unspecified: A59.9

## 2020-10-20 MED ORDER — METRONIDAZOLE 500 MG PO TABS: 500.0000 mg | ORAL_TABLET | Freq: Two times a day (BID) | ORAL | 0 refills | Status: DC

## 2020-10-21 MED ORDER — HYDROCODONE-ACETAMINOPHEN 5-325 MG PO TABS
ORAL_TABLET | ORAL | 0 refills | Status: DC
Start: 2020-10-21 — End: 2020-11-01

## 2020-11-01 ENCOUNTER — Telehealth: Payer: Self-pay | Admitting: Orthopaedic Surgery

## 2020-11-01 ENCOUNTER — Telehealth: Payer: Self-pay

## 2020-11-01 MED ORDER — HYDROCODONE-ACETAMINOPHEN 5-325 MG PO TABS
ORAL_TABLET | ORAL | 0 refills | Status: DC
Start: 2020-11-01 — End: 2020-11-09

## 2020-11-01 NOTE — Telephone Encounter (Signed)
Pt instructed to call provider that fills this for her.

## 2020-11-01 NOTE — Telephone Encounter (Signed)
Patient called requesting a refill on cyclobenzaprine ph# [phone]

## 2020-11-01 NOTE — Telephone Encounter (Signed)
Patient requests Hydrocodone/Acetaminophen 5-325 mgs.  Qty  14  Sig: One tablet every six hours for pain. Limit 7 days.  [facility]  PATIENT STATES SHE GOT HER LAST REFILL ON 10/25/20 because her "insurance was messed up".

## 2020-11-09 ENCOUNTER — Telehealth: Payer: Self-pay | Admitting: Orthopaedic Surgery

## 2020-11-09 MED ORDER — HYDROCODONE-ACETAMINOPHEN 5-325 MG PO TABS
ORAL_TABLET | ORAL | 0 refills | Status: DC
Start: 2020-11-09 — End: 2020-11-15

## 2020-11-09 NOTE — Telephone Encounter (Signed)
Patient requests Hydrocodone/Acetaminophen 5-325 mgs.  Qty  14  Sig: One tablet every six hours for pain. Limit 7 days.  [facility]

## 2020-11-11 ENCOUNTER — Other Ambulatory Visit: Payer: Self-pay

## 2020-11-11 ENCOUNTER — Encounter: Payer: Self-pay | Admitting: Orthopaedic Surgery

## 2020-11-11 ENCOUNTER — Ambulatory Visit (INDEPENDENT_AMBULATORY_CARE_PROVIDER_SITE_OTHER): Payer: Medicaid Other | Admitting: Orthopaedic Surgery

## 2020-11-11 DIAGNOSIS — M25562 Pain in left knee: Secondary | ICD-10-CM | POA: Diagnosis not present

## 2020-11-11 DIAGNOSIS — G8929 Other chronic pain: Secondary | ICD-10-CM

## 2020-11-11 DIAGNOSIS — F17210 Nicotine dependence, cigarettes, uncomplicated: Secondary | ICD-10-CM

## 2020-11-11 NOTE — Progress Notes (Signed)
PROCEDURE NOTE:  The patient requests injections of the left knee , verbal consent was obtained.  The left knee was prepped appropriately after time out was performed.   Sterile technique was observed and injection of 1 cc of DepoMedrol 40 mg with several cc's of plain xylocaine. Anesthesia was provided by ethyl chloride and a 20-gauge needle was used to inject the knee area. The injection was tolerated well.  A band aid dressing was applied.  The patient was advised to apply ice later today and tomorrow to the injection sight as needed.  Return in one month.  Call if any problem.  Precautions discussed.   Electronically Signed [name], MD 6/2/20228:05 AM

## 2020-11-15 ENCOUNTER — Telehealth: Payer: Self-pay | Admitting: Orthopaedic Surgery

## 2020-11-15 MED ORDER — HYDROCODONE-ACETAMINOPHEN 5-325 MG PO TABS
ORAL_TABLET | ORAL | 0 refills | Status: DC
Start: 2020-11-15 — End: 2020-11-22

## 2020-11-15 NOTE — Telephone Encounter (Signed)
Patient called request refill for her pain medicine   HYDROcodone-acetaminophen (NORCO/VICODIN) 5-325    [facility]

## 2020-11-19 ENCOUNTER — Other Ambulatory Visit: Payer: Medicaid Other

## 2020-11-22 ENCOUNTER — Telehealth: Payer: Self-pay | Admitting: Orthopaedic Surgery

## 2020-11-22 ENCOUNTER — Other Ambulatory Visit: Payer: Medicaid Other

## 2020-11-22 MED ORDER — HYDROCODONE-ACETAMINOPHEN 5-325 MG PO TABS
ORAL_TABLET | ORAL | 0 refills | Status: DC
Start: 2020-11-22 — End: 2020-11-29

## 2020-11-22 NOTE — Telephone Encounter (Signed)
    Patient called request refill for her pain medicine    HYDROcodone-acetaminophen (NORCO/VICODIN) 5-325      [facility]

## 2020-11-24 ENCOUNTER — Telehealth: Payer: Self-pay | Admitting: Orthopaedic Surgery

## 2020-11-24 NOTE — Telephone Encounter (Signed)
Patient called for refill - states per insurer, "now every 3 days" [facility], [address], [place]

## 2020-11-25 NOTE — Telephone Encounter (Signed)
Called back to patient and relayed; voiced understanding.

## 2020-11-29 ENCOUNTER — Telehealth: Payer: Self-pay | Admitting: Orthopaedic Surgery

## 2020-11-29 MED ORDER — HYDROCODONE-ACETAMINOPHEN 5-325 MG PO TABS
ORAL_TABLET | ORAL | 0 refills | Status: DC
Start: 2020-11-29 — End: 2020-12-06

## 2020-11-29 NOTE — Telephone Encounter (Signed)
Patient called for refill: HYDROcodone-acetaminophen (NORCO/VICODIN) 5-325 MG tablet 12 tablet    [facility] [place]

## 2020-12-01 ENCOUNTER — Telehealth: Payer: Self-pay

## 2020-12-01 NOTE — Telephone Encounter (Signed)
Pt LM on machine, for someone to return call   Called pt back voicemail is full, no answer

## 2020-12-02 ENCOUNTER — Other Ambulatory Visit: Payer: Self-pay

## 2020-12-02 ENCOUNTER — Encounter: Payer: Self-pay | Admitting: Internal Medicine

## 2020-12-02 ENCOUNTER — Ambulatory Visit: Payer: Medicaid Other | Admitting: Internal Medicine

## 2020-12-02 VITALS — BP 131/88 | HR 68 | Temp 99.2°F | Resp 18 | Ht 61.0 in | Wt 116.1 lb

## 2020-12-02 DIAGNOSIS — G8929 Other chronic pain: Secondary | ICD-10-CM | POA: Insufficient documentation

## 2020-12-02 DIAGNOSIS — M1712 Unilateral primary osteoarthritis, left knee: Secondary | ICD-10-CM | POA: Diagnosis not present

## 2020-12-02 DIAGNOSIS — M25512 Pain in left shoulder: Secondary | ICD-10-CM

## 2020-12-02 DIAGNOSIS — J309 Allergic rhinitis, unspecified: Secondary | ICD-10-CM

## 2020-12-02 MED ORDER — IBUPROFEN 600 MG PO TABS: 600.0000 mg | ORAL_TABLET | Freq: Three times a day (TID) | ORAL | 0 refills | Status: DC | PRN

## 2020-12-02 MED ORDER — CYCLOBENZAPRINE HCL 10 MG PO TABS: 10.0000 mg | ORAL_TABLET | Freq: Two times a day (BID) | ORAL | 0 refills | Status: DC | PRN

## 2020-12-02 MED ORDER — LEVOCETIRIZINE DIHYDROCHLORIDE 5 MG PO TABS
5.0000 mg | ORAL_TABLET | Freq: Every evening | ORAL | 5 refills | Status: DC
Start: 2020-12-02 — End: 2022-03-22

## 2020-12-02 MED ORDER — FLUTICASONE PROPIONATE 50 MCG/ACT NA SUSP: 2.0000 | Freq: Every day | NASAL | 6 refills | Status: DC

## 2020-12-02 NOTE — Assessment & Plan Note (Signed)
Headache likely due to frontal sinusitis, chronic - likely allergic Started Levocetirizine and Flonase

## 2020-12-02 NOTE — Progress Notes (Signed)
Acute Office Visit  Subjective:    Patient ID: Judy Cross, female    DOB: Nov 01, 1965, 55 y.o.   MRN: [id number]  Chief Complaint  Patient presents with   Knee Pain    Left knee left shoulder and head hurting started hurting yesterday says this is a chronic condition     HPI Patient is in today for evaluation of chronic left knee pain and acute on chronic left shoulder pain. She has been taking Norco for pain and has had knee injections by Orthopedic surgeon. She denies any recent injury. 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  She also reports sinus pressure related frontal headache. Denies any fever, chills, sore throat, dyspnea or wheezing.  Past Medical History:  Diagnosis Date   Allergy    Anxiety 06/12/2019   Arthritis    Asthma in adult, mild intermittent, uncomplicated 4/00/8676   Breast nodule 10/12/2015   Breast nodule 10/12/2015   Bulge of lumbar disc without myelopathy    Endometrial polyp 04/28/2013   Enlarged uterus 03/26/2013   Fibroids 04/28/2013   Hot flashes 03/26/2013   Hot flashes 03/26/2013   Hypertension    Irregular bleeding 03/26/2013   Neuromuscular disorder ([place])    sciatica   Seasonal allergies    Seizures ([place])    last seizure was 2 years ago; unknown etiology. On Keppra.   Trichomoniasis 10/20/2020   tx 10/20/20  POC___    Past Surgical History:  Procedure Laterality Date   ABLATION     with polyp removal.   BREAST BIOPSY Left    neg   CESAREAN SECTION     x 3   COLONOSCOPY WITH PROPOFOL N/A 01/11/2016   Procedure: COLONOSCOPY WITH PROPOFOL;  Surgeon: Danie Binder, MD;  Location: [facility];  Service: Endoscopy;  Laterality: N/A;  800   EXAMINATION UNDER ANESTHESIA  02/28/2012   Procedure: EXAM UNDER ANESTHESIA;  Surgeon: Donato Heinz, MD;  Location: [facility];  Service: General;  Laterality: N/A;   EXCISION MASS UPPER EXTREMETIES Left 11/13/2019   Procedure: EXCISION MASS  UPPER EXTREMETIES ring finger left;  Surgeon: Carole Civil, MD;  Location: [facility];  Service: Orthopedics;  Laterality: Left;   GANGLION CYST EXCISION Left    HYSTEROSCOPY WITH D & C N/A 05/23/2013   Procedure: DILATATION AND CURETTAGE /HYSTEROSCOPY;  Surgeon: Jonnie Kind, MD;  Location: [facility];  Service: Gynecology;  Laterality: N/A;   POLYPECTOMY N/A 05/23/2013   Procedure: ENDOMETRIAL POLYP REMOVAL;  Surgeon: Jonnie Kind, MD;  Location: [facility];  Service: Gynecology;  Laterality: N/A;   SPHINCTEROTOMY  02/28/2012   Procedure: SPHINCTEROTOMY;  Surgeon: Donato Heinz, MD;  Location: [facility];  Service: General;  Laterality: N/A;  Lateral Internal Sphincterotomy   TRIGGER FINGER RELEASE Left    ring finger   TUBAL LIGATION      Family History  Problem Relation Age of Onset   Hypertension Mother    Arthritis Mother    COPD Mother    Diabetes Mother    Heart disease Mother    Hypertension Father    Parkinson's disease Father    Colon cancer Neg Hx     Social History   Socioeconomic History   Marital status: Single    Spouse name: Not on file   Number of children: 3   Years of education: 14   Highest education level: Not on  file  Occupational History   Occupation: home health aid  Tobacco Use   Smoking status: Some Days    Packs/day: 0.25    Years: 20.00    Pack years: 5.00    Types: Cigarettes   Smokeless tobacco: Never   Tobacco comments:    smokes 1 cig daily  Vaping Use   Vaping Use: Never used  Substance and Sexual Activity   Alcohol use: Yes    Comment: occasional   Drug use: No   Sexual activity: Yes    Birth control/protection: Surgical    Comment: tubal and ablation  Other Topics Concern   Not on file  Social History Narrative   Degree in child care   Currently is a home health aid   Lives at home with youngest daughter Jeanella Craze senior this year    Son 24-Jalon  in college for sociology    Daughter [place]       Enjoys: write, reading, cooking, drawing      Diet: eats all food groups   Caffeine: pepsi weekly    Water: 6-8 cups    Juice some times      Wears seat belt    Smoke detectors    Does not use phone with driving.   Social Determinants of Health   Financial Resource Strain: Low Risk    Difficulty of Paying Living Expenses: Not very hard  Food Insecurity: Food Insecurity Present   Worried About [place] in the Last Year: Sometimes true   Arboriculturist in the Last Year: Often true  Transportation Needs: No Transportation Needs   Lack of Transportation (Medical): No   Lack of Transportation (Non-Medical): No  Physical Activity: Sufficiently Active   Days of Exercise per Week: 5 days   Minutes of Exercise per Session: 40 min  Stress: Stress Concern Present   Feeling of Stress : Very much  Social Connections: Moderately Isolated   Frequency of Communication with Friends and Family: Three times a week   Frequency of Social Gatherings with Friends and Family: Once a week   Attends Religious Services: More than 4 times per year   Active Member of [employer] or Organizations: No   Attends Music therapist: Never   Marital Status: Never married  Human resources officer Violence: Not At Risk   Fear of Current or Ex-Partner: No   Emotionally Abused: No   Physically Abused: No   Sexually Abused: No    Outpatient Medications Prior to Visit  Medication Sig Dispense Refill   albuterol (PROAIR HFA) 108 (90 Base) MCG/ACT inhaler INHALE 1-2 PUFFS BY MOUTH EVERY 6 HOURS AS NEEDED FOR WHEEZE OR SHORTNESS OF BREATHMNT 8.5 each 0   hydrochlorothiazide (HYDRODIURIL) 25 MG tablet TAKE 1 TABLET BY MOUTH EVERY DAY 90 tablet 2   HYDROcodone-acetaminophen (NORCO/VICODIN) 5-325 MG tablet One tablet every six hours for pain.  Limit 7 days. 12 tablet 0   lidocaine (LIDODERM) 5 % Place 1 patch onto the skin daily. Remove & Discard patch within 12 hours or as directed by MD 30 patch 0    cyclobenzaprine (FLEXERIL) 10 MG tablet Take 1 tablet (10 mg total) by mouth 2 (two) times daily as needed for muscle spasms. 20 tablet 0   ibuprofen (ADVIL) 600 MG tablet Take 1 tablet (600 mg total) by mouth every 8 (eight) hours as needed for moderate pain. 30 tablet 0   No facility-administered medications prior to visit.  Allergies  Allergen Reactions   Lisinopril Swelling    swelling to entire mouth.   Penicillins Rash    Has patient had a PCN reaction causing immediate rash, facial/tongue/throat swelling, SOB or lightheadedness with hypotension: no - next day Has patient had a PCN reaction causing severe rash involving mucus membranes or skin necrosis: No Has patient had a PCN reaction that required hospitalization No Has patient had a PCN reaction occurring within the last 10 years: Yes If all of the above answers are "NO", then may proceed with Cephalosporin use.    Tramadol Other (See Comments)    dizziness    Review of Systems  Constitutional:  Positive for fatigue. Negative for chills and fever.  HENT:  Positive for congestion, postnasal drip, sinus pressure and sinus pain. Negative for sore throat and trouble swallowing.   Respiratory:  Negative for cough and shortness of breath.   Cardiovascular:  Negative for chest pain and palpitations.  Musculoskeletal:  Positive for arthralgias and back pain.  Skin:  Negative for rash.  Neurological:  Negative for weakness and numbness.      Objective:    Physical Exam Vitals reviewed.  Constitutional:      General: She is not in acute distress.    Appearance: She is not diaphoretic.  HENT:     Head: Normocephalic and atraumatic.     Nose: Nose normal.     Mouth/Throat:     Mouth: Mucous membranes are moist.  Eyes:     General: No scleral icterus.    Extraocular Movements: Extraocular movements intact.  Cardiovascular:     Rate and Rhythm: Normal rate and regular rhythm.     Pulses: Normal pulses.     Heart sounds:  Normal heart sounds. No murmur heard. Pulmonary:     Breath sounds: Normal breath sounds. No wheezing or rales.  Musculoskeletal:        General: Tenderness (Left knee, with minimal swelling) present.     Cervical back: Neck supple. No tenderness.     Right lower leg: No edema.     Left lower leg: No edema.  Skin:    General: Skin is warm.     Findings: No rash.  Neurological:     General: No focal deficit present.     Mental Status: She is alert and oriented to person, place, and time.  Psychiatric:        Mood and Affect: Mood normal.        Behavior: Behavior normal.    BP 131/88 (BP Location: Left Arm, Patient Position: Sitting, Cuff Size: Normal)   Pulse 68   Temp 99.2 F (37.3 C) (Oral)   Resp 18   Ht 5\' 1"  (7.564 m)   Wt 116 lb 1.3 oz (52.7 kg)   SpO2 100%   BMI 21.93 kg/m  Wt Readings from Last 3 Encounters:  12/02/20 116 lb 1.3 oz (52.7 kg)  10/12/20 117 lb 3.2 oz (53.2 kg)  06/10/20 118 lb (53.5 kg)    Health Maintenance Due  Topic Date Due   Pneumococcal Vaccine 1-73 Years old (1 - PCV) Never done   Hepatitis C Screening  Never done   TETANUS/TDAP  Never done   Zoster Vaccines- Shingrix (1 of 2) Never done   COVID-19 Vaccine (3 - Booster for Pfizer series) 08/31/2020    There are no preventive care reminders to display for this patient.   Lab Results  Component Value Date   TSH 0.673 05/20/2019  Lab Results  Component Value Date   WBC 5.6 03/24/2020   HGB 12.5 03/24/2020   HCT 36.5 03/24/2020   MCV 96.1 03/24/2020   PLT 261 03/24/2020   Lab Results  Component Value Date   NA 138 03/24/2020   K 2.7 (LL) 03/24/2020   CO2 27 03/24/2020   GLUCOSE 111 (H) 03/24/2020   BUN 8 03/24/2020   CREATININE 0.74 03/24/2020   BILITOT 1.1 03/24/2020   ALKPHOS 43 03/24/2020   AST 20 03/24/2020   ALT 20 03/24/2020   PROT 6.8 03/24/2020   ALBUMIN 3.7 03/24/2020   CALCIUM 9.0 03/24/2020   ANIONGAP 13 03/24/2020   Lab Results  Component Value Date    CHOL 199 05/20/2019   Lab Results  Component Value Date   HDL 95 05/20/2019   Lab Results  Component Value Date   LDLCALC 86 05/20/2019   Lab Results  Component Value Date   TRIG 105 05/20/2019   Lab Results  Component Value Date   CHOLHDL 2.1 05/20/2019   Lab Results  Component Value Date   HGBA1C 5.4 05/20/2019       Assessment & Plan:   Problem List Items Addressed This Visit       Respiratory   Allergic sinusitis    Headache likely due to frontal sinusitis, chronic - likely allergic Started Levocetirizine and Flonase       Relevant Medications   levocetirizine (XYZAL) 5 MG tablet   fluticasone (FLONASE) 50 MCG/ACT nasal spray     Musculoskeletal and Integument   Arthritis of left knee - Primary    Last X-ray of left knee reviewed - mild arthritic changes Followed by Orthopedic surgery On Norco PRN Needs to discuss about possible surgical options with Orthopedic surgeon       Relevant Medications   ibuprofen (ADVIL) 600 MG tablet   cyclobenzaprine (FLEXERIL) 10 MG tablet     Other   Chronic left shoulder pain    Recent worsening of symptoms and has noticed swelling Could be due to chronic overuse injury Followed by Orthopedic surgery Ibuprofen PRN Flexeril PRN for muscle spasms       Relevant Medications   ibuprofen (ADVIL) 600 MG tablet   cyclobenzaprine (FLEXERIL) 10 MG tablet     Meds ordered this encounter  Medications   ibuprofen (ADVIL) 600 MG tablet    Sig: Take 1 tablet (600 mg total) by mouth every 8 (eight) hours as needed for moderate pain.    Dispense:  30 tablet    Refill:  0   cyclobenzaprine (FLEXERIL) 10 MG tablet    Sig: Take 1 tablet (10 mg total) by mouth 2 (two) times daily as needed for muscle spasms.    Dispense:  20 tablet    Refill:  0   levocetirizine (XYZAL) 5 MG tablet    Sig: Take 1 tablet (5 mg total) by mouth every evening.    Dispense:  30 tablet    Refill:  5   fluticasone (FLONASE) 50 MCG/ACT nasal  spray    Sig: Place 2 sprays into both nostrils daily.    Dispense:  16 g    Refill:  6     Lazaro Isenhower Keith Rake, MD

## 2020-12-02 NOTE — Patient Instructions (Signed)
Take Ibuprofen as needed for knee and shoulder pain. Take Cyclobenzaprine for muscle spasms.  Please discuss with your Orthopedic surgeon about surgical options for knee arthritis.  Please start taking Levocetirizine and use Flonase for allergies.

## 2020-12-02 NOTE — Assessment & Plan Note (Signed)
Last X-ray of left knee reviewed - mild arthritic changes Followed by Orthopedic surgery On Norco PRN Needs to discuss about possible surgical options with Orthopedic surgeon

## 2020-12-02 NOTE — Assessment & Plan Note (Signed)
Recent worsening of symptoms and has noticed swelling Could be due to chronic overuse injury Followed by Orthopedic surgery Ibuprofen PRN Flexeril PRN for muscle spasms

## 2020-12-06 ENCOUNTER — Telehealth: Payer: Self-pay | Admitting: Orthopaedic Surgery

## 2020-12-06 MED ORDER — HYDROCODONE-ACETAMINOPHEN 5-325 MG PO TABS: ORAL_TABLET | ORAL | 0 refills | Status: DC

## 2020-12-06 NOTE — Telephone Encounter (Signed)
HYDROcodone-acetaminophen (NORCO/VICODIN) 5-325 MG tablet 12 tablet    [facility] [place]

## 2020-12-09 ENCOUNTER — Ambulatory Visit (INDEPENDENT_AMBULATORY_CARE_PROVIDER_SITE_OTHER): Payer: Medicaid Other | Admitting: Orthopaedic Surgery

## 2020-12-09 ENCOUNTER — Encounter: Payer: Self-pay | Admitting: Orthopaedic Surgery

## 2020-12-09 ENCOUNTER — Encounter: Payer: Medicaid Other | Admitting: Internal Medicine

## 2020-12-09 ENCOUNTER — Other Ambulatory Visit: Payer: Self-pay

## 2020-12-09 VITALS — BP 138/111 | HR 64 | Ht 61.5 in | Wt 116.0 lb

## 2020-12-09 DIAGNOSIS — G8929 Other chronic pain: Secondary | ICD-10-CM

## 2020-12-09 DIAGNOSIS — M25562 Pain in left knee: Secondary | ICD-10-CM

## 2020-12-09 DIAGNOSIS — F17210 Nicotine dependence, cigarettes, uncomplicated: Secondary | ICD-10-CM | POA: Diagnosis not present

## 2020-12-09 NOTE — Progress Notes (Signed)
My knee hurts more.  She has chronic pain of the left knee.  She has no new trauma.  She has swelling at times and popping.  She has no redness, no giving way.  She is taking pain medicine and NSAIDs.  Her MRI a year ago this month was negative. I do not see need to repeat now.  ROM of the left knee is almost full.  She has some crepitus and no effusion today.  NV intact.  Gait is good.  Encounter Diagnoses  Name Primary?   Chronic pain of left knee Yes   Cigarette nicotine dependence without complication    Continue as she is doing.    Return in one month.  Call if any problem.  Precautions discussed.  Electronically Signed [name], MD 6/30/20228:14 AM

## 2020-12-14 ENCOUNTER — Telehealth: Payer: Self-pay | Admitting: Orthopaedic Surgery

## 2020-12-14 MED ORDER — HYDROCODONE-ACETAMINOPHEN 5-325 MG PO TABS: ORAL_TABLET | ORAL | 0 refills | Status: DC

## 2020-12-14 NOTE — Telephone Encounter (Signed)
  Patient request refill for   HYDROcodone-acetaminophen (NORCO/VICODIN) 5-325   [facility] [place]

## 2020-12-27 ENCOUNTER — Other Ambulatory Visit: Payer: Self-pay | Admitting: Adult Health

## 2020-12-28 ENCOUNTER — Telehealth: Payer: Self-pay | Admitting: Orthopaedic Surgery

## 2020-12-28 MED ORDER — HYDROCODONE-ACETAMINOPHEN 5-325 MG PO TABS: ORAL_TABLET | ORAL | 0 refills | Status: DC

## 2020-12-28 NOTE — Telephone Encounter (Signed)
HYDROcodone-acetaminophen (NORCO/VICODIN) 5-325 MG tablet 12 tablet    [facility] [place]

## 2020-12-29 ENCOUNTER — Encounter: Payer: Medicaid Other | Admitting: Internal Medicine

## 2021-01-06 ENCOUNTER — Other Ambulatory Visit: Payer: Self-pay

## 2021-01-06 ENCOUNTER — Ambulatory Visit (INDEPENDENT_AMBULATORY_CARE_PROVIDER_SITE_OTHER): Payer: Medicaid Other | Admitting: Orthopaedic Surgery

## 2021-01-06 ENCOUNTER — Encounter: Payer: Self-pay | Admitting: Orthopaedic Surgery

## 2021-01-06 VITALS — BP 127/87 | HR 72 | Ht 61.5 in | Wt 114.2 lb

## 2021-01-06 DIAGNOSIS — M25562 Pain in left knee: Secondary | ICD-10-CM

## 2021-01-06 DIAGNOSIS — G8929 Other chronic pain: Secondary | ICD-10-CM | POA: Diagnosis not present

## 2021-01-06 MED ORDER — HYDROCODONE-ACETAMINOPHEN 5-325 MG PO TABS: ORAL_TABLET | ORAL | 0 refills | Status: DC

## 2021-01-06 NOTE — Progress Notes (Signed)
My knee still hurts.  She has pain in the left knee, swelling but no giving way.  She has no new trauma.    The left lower extremity is examined:  Inspection:  Thigh:  Non-tender and no defects  Knee has swelling slight effusion.                        Joint tenderness is present                        Patient is tender over the medial joint line  Lower Leg:  Has normal appearance and no tenderness or defects  Ankle:  Non-tender and no defects  Foot:  Non-tender and no defects Range of Motion:  Knee:  Range of motion is: 0-110                        Crepitus is  present  Ankle:  Range of motion is normal. Strength and Tone:  The left lower extremity has normal strength and tone. Stability:  Knee:  The knee is stable.  Ankle:  The ankle is stable.     Encounter Diagnosis  Name Primary?   Chronic pain of left knee Yes   I will refill pain medicine.  I have reviewed the [place] web site prior to prescribing narcotic medicine for this patient.  Return in two months.  Call if any problem.  Precautions discussed.  Electronically Signed [name], MD 7/28/20228:06 AM

## 2021-01-11 ENCOUNTER — Other Ambulatory Visit: Payer: Self-pay | Admitting: *Deleted

## 2021-01-11 ENCOUNTER — Telehealth: Payer: Self-pay

## 2021-01-11 DIAGNOSIS — J4520 Mild intermittent asthma, uncomplicated: Secondary | ICD-10-CM

## 2021-01-11 MED ORDER — ALBUTEROL SULFATE HFA 108 (90 BASE) MCG/ACT IN AERS: INHALATION_SPRAY | RESPIRATORY_TRACT | 0 refills | Status: DC

## 2021-01-11 NOTE — Telephone Encounter (Signed)
Patient called need med refill  albuterol [facility] HFA) 108 (90 Base) MCG/ACT inhaler [id number]  [facility] [place]

## 2021-01-11 NOTE — Telephone Encounter (Signed)
Pt medication sent to pharmacy  

## 2021-01-12 ENCOUNTER — Telehealth: Payer: Self-pay | Admitting: Orthopaedic Surgery

## 2021-01-12 MED ORDER — HYDROCODONE-ACETAMINOPHEN 5-325 MG PO TABS: ORAL_TABLET | ORAL | 0 refills | Status: DC

## 2021-01-12 NOTE — Telephone Encounter (Signed)
Patient called request refill on her pain medicine   HYDROcodone-acetaminophen (NORCO/VICODIN) 5-325 MG tablet  [facility] [place]

## 2021-01-17 ENCOUNTER — Other Ambulatory Visit: Payer: Self-pay | Admitting: Internal Medicine

## 2021-01-17 DIAGNOSIS — J4520 Mild intermittent asthma, uncomplicated: Secondary | ICD-10-CM

## 2021-01-20 ENCOUNTER — Telehealth: Payer: Self-pay

## 2021-01-20 ENCOUNTER — Other Ambulatory Visit: Payer: Self-pay | Admitting: *Deleted

## 2021-01-20 ENCOUNTER — Telehealth: Payer: Self-pay | Admitting: Orthopaedic Surgery

## 2021-01-20 DIAGNOSIS — J4520 Mild intermittent asthma, uncomplicated: Secondary | ICD-10-CM

## 2021-01-20 MED ORDER — ALBUTEROL SULFATE HFA 108 (90 BASE) MCG/ACT IN AERS: INHALATION_SPRAY | RESPIRATORY_TRACT | 0 refills | Status: DC

## 2021-01-20 NOTE — Telephone Encounter (Signed)
Medication sent to pharmacy  

## 2021-01-20 NOTE — Telephone Encounter (Signed)
Patient called this afternoon to request refill; discussed Dr out of clinic this week and reviewed policy of requests being needed before noon on Thursdays; voiced understanding. HYDROcodone-acetaminophen (NORCO/VICODIN) 5-325 MG tablet 12 tablet      [facility] [address]

## 2021-01-20 NOTE — Telephone Encounter (Signed)
Patient called need refill albuterol (PROAIR HFA) 108 (90 Base) MCG/ACT inhaler  Would like to get 2 if possible.   [facility]: [facility]

## 2021-01-22 IMAGING — DX DG HIP (WITH OR WITHOUT PELVIS) 2-3V*L*
3 series · 3 of 3 positions shown · non-contrast
Comparison: Hip radiographs 02/28/2017

CLINICAL DATA: Fall.  Pain.

EXAM:
DG HIP (WITH OR WITHOUT PELVIS) 2-3V LEFT

[pelvis ap]
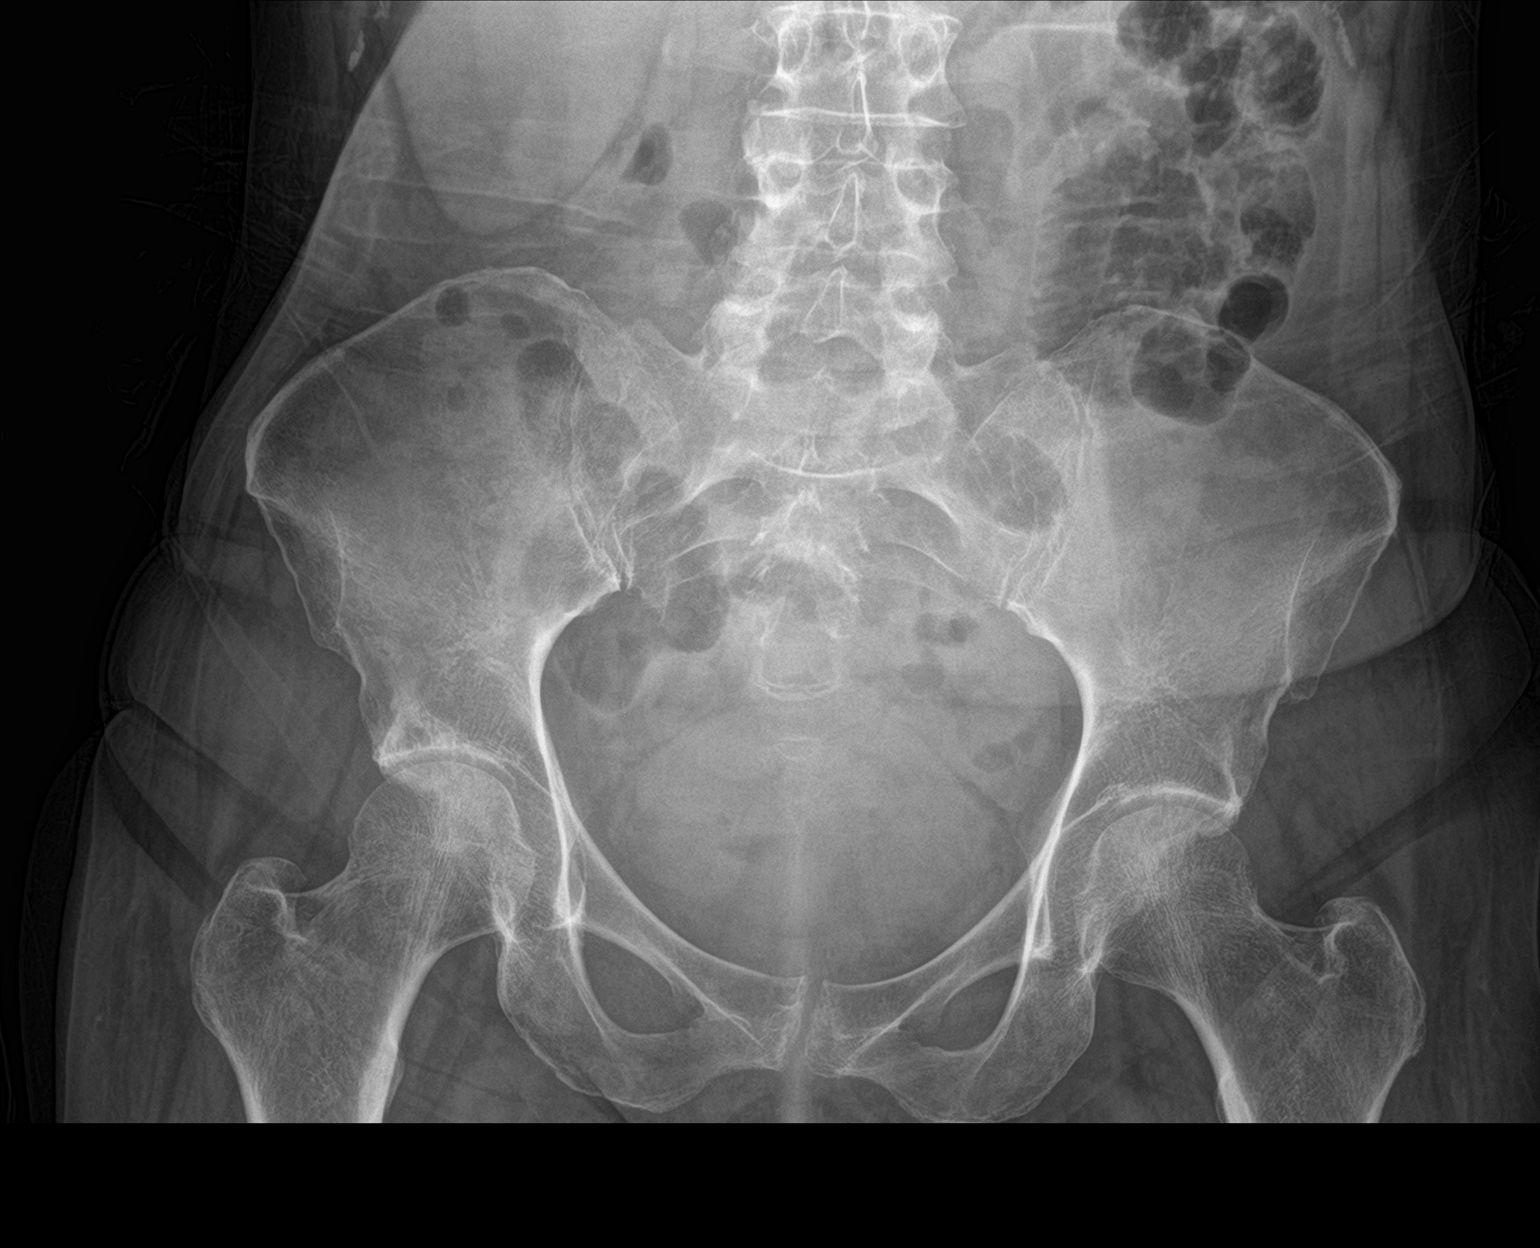

[hip ap]
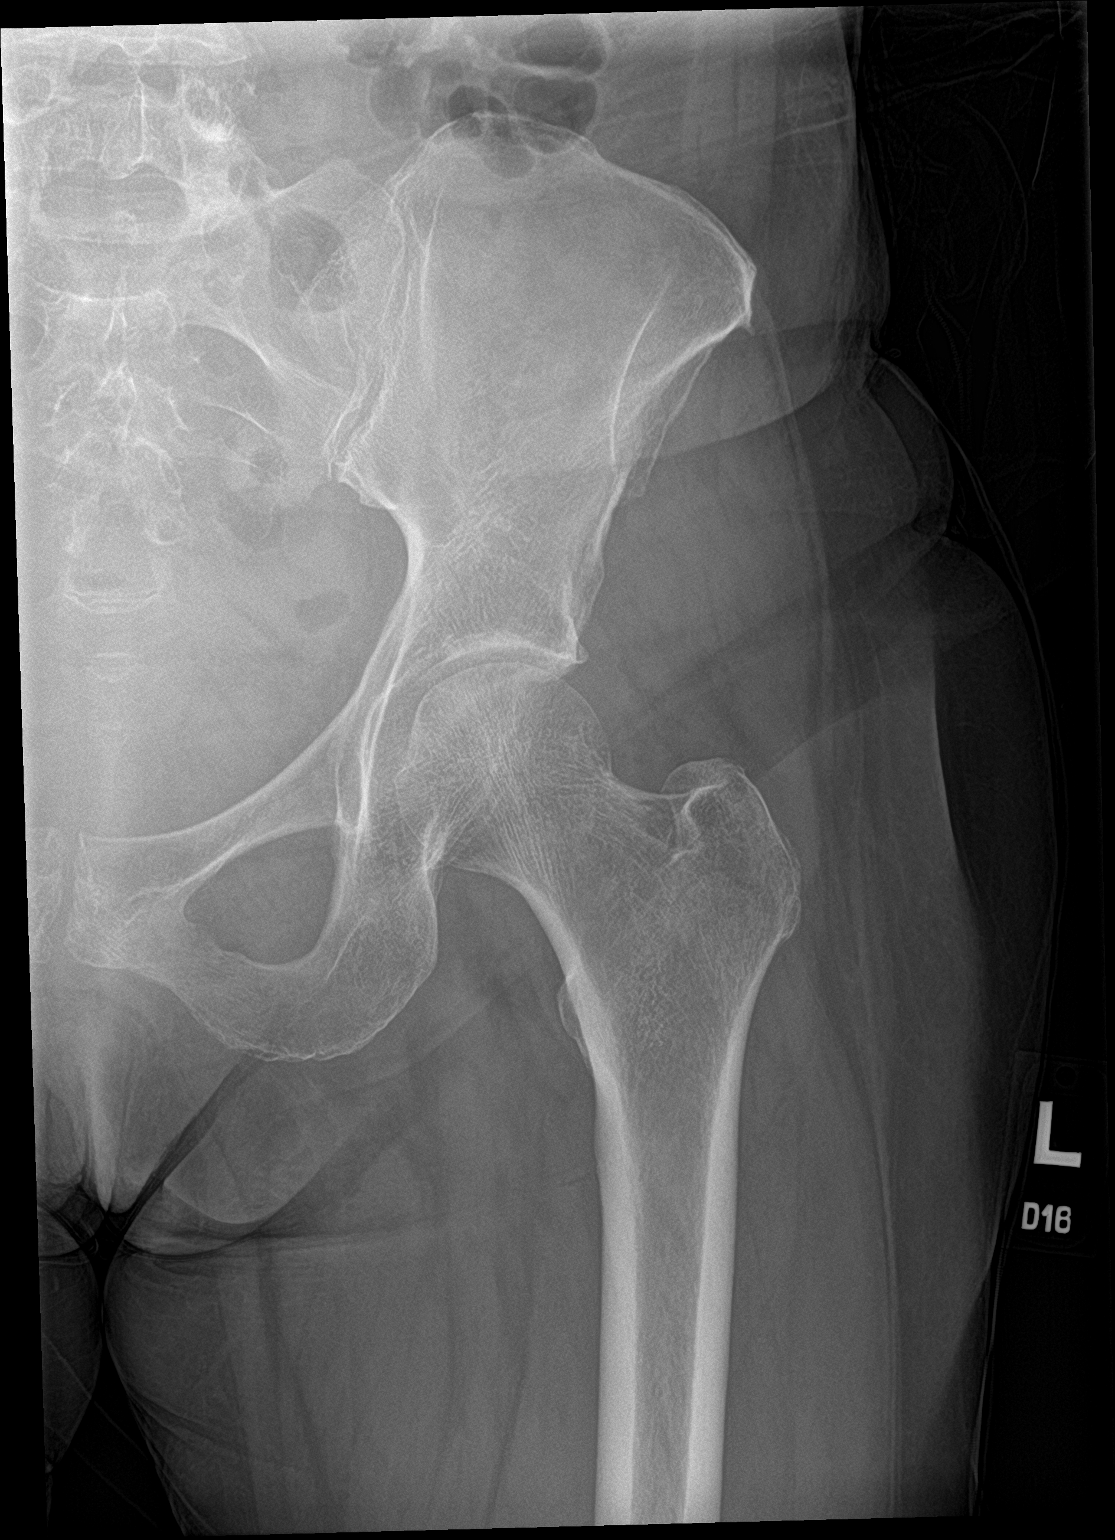

[hip frog leg]
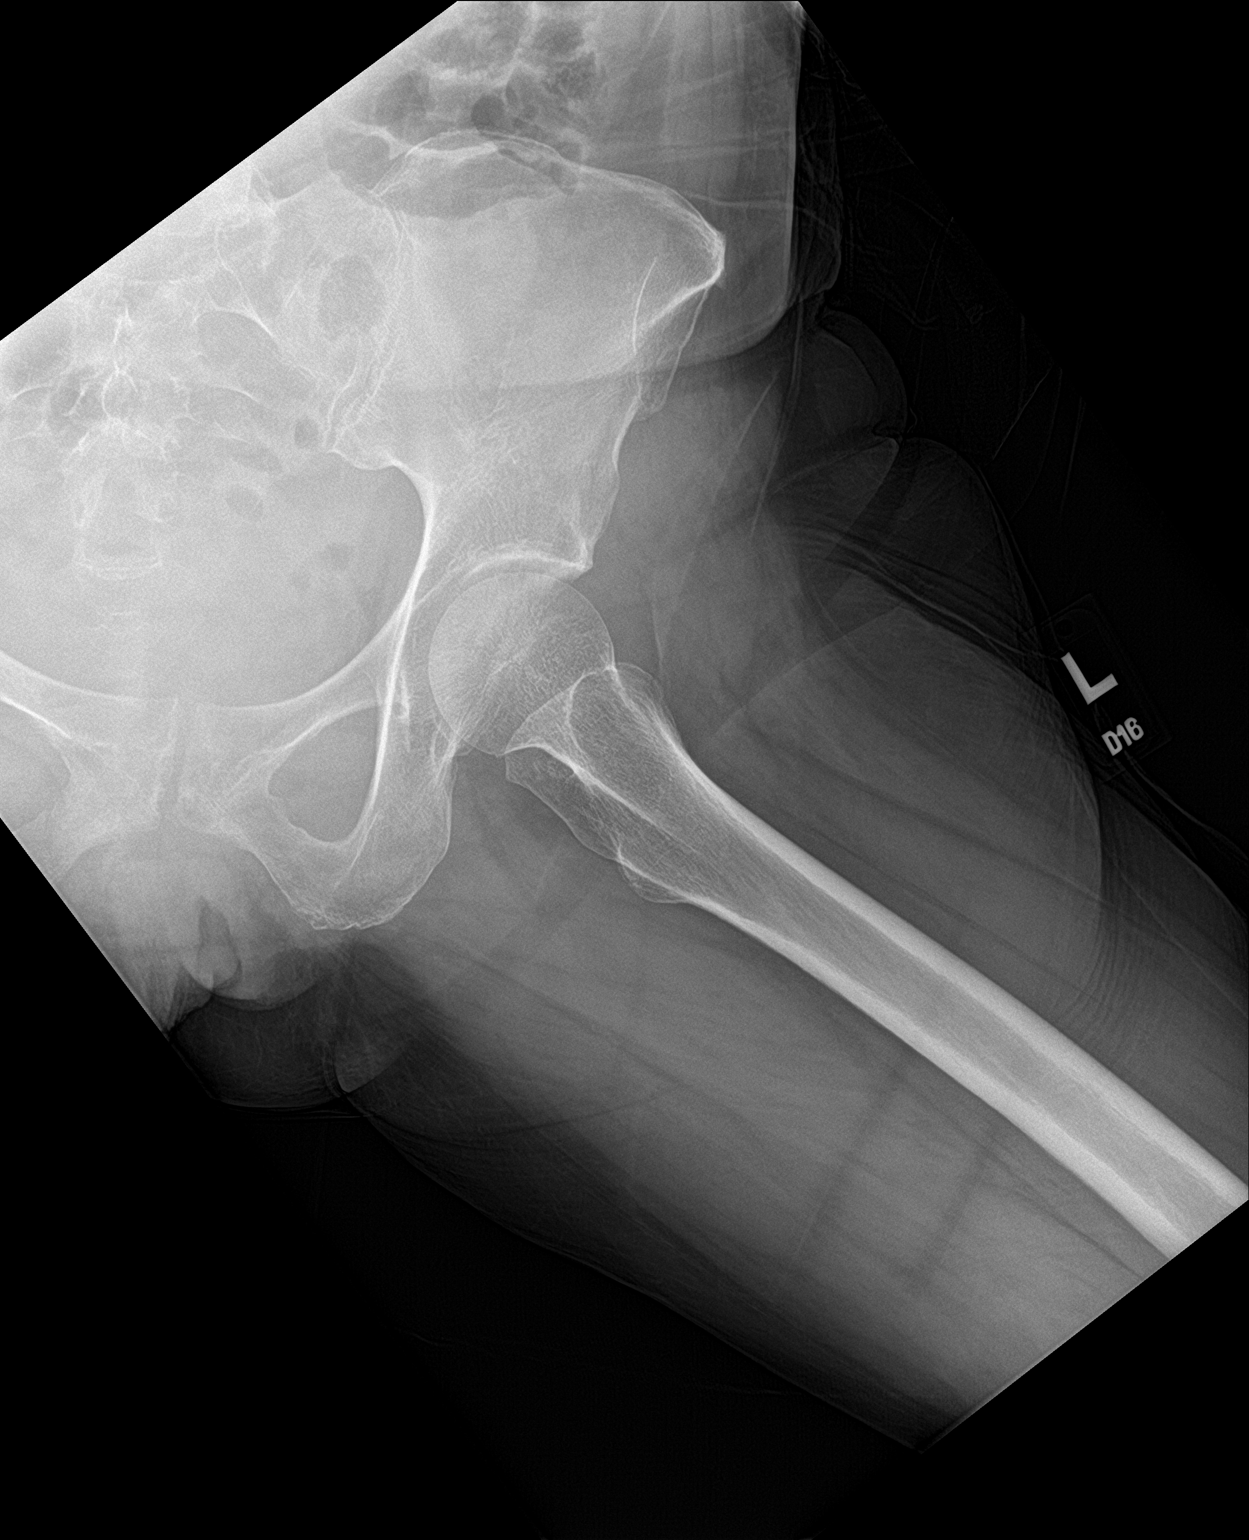

[3 of 3 positions shown; findings below may reference images not displayed]

FINDINGS: There is no evidence of hip fracture or dislocation. Mild bilateral
hip and pubic symphysis degenerative changes. Partially imaged lower
lumbar degenerative change.
IMPRESSION: 1. No evidence of acute fracture or malalignment.
2. Mild bilateral hip degenerative change.

## 2021-01-24 MED ORDER — HYDROCODONE-ACETAMINOPHEN 5-325 MG PO TABS: ORAL_TABLET | ORAL | 0 refills | Status: DC

## 2021-01-31 ENCOUNTER — Telehealth: Payer: Self-pay | Admitting: Orthopaedic Surgery

## 2021-01-31 NOTE — Telephone Encounter (Signed)
Patient requests  HYDROcodone-acetaminophen (NORCO/VICODIN) 5-325 MG tablet 12 tablet      [facility] [place]

## 2021-02-01 MED ORDER — HYDROCODONE-ACETAMINOPHEN 5-325 MG PO TABS: ORAL_TABLET | ORAL | 0 refills | Status: DC

## 2021-02-03 ENCOUNTER — Telehealth: Payer: Medicaid Other | Admitting: Internal Medicine

## 2021-02-03 ENCOUNTER — Other Ambulatory Visit: Payer: Self-pay

## 2021-02-08 ENCOUNTER — Telehealth: Payer: Self-pay | Admitting: Radiology

## 2021-02-08 MED ORDER — HYDROCODONE-ACETAMINOPHEN 5-325 MG PO TABS: ORAL_TABLET | ORAL | 0 refills | Status: DC

## 2021-02-08 NOTE — Telephone Encounter (Signed)
Refill request received from patient

## 2021-02-15 ENCOUNTER — Other Ambulatory Visit: Payer: Self-pay

## 2021-02-15 ENCOUNTER — Encounter: Payer: Self-pay | Admitting: Orthopaedic Surgery

## 2021-02-15 ENCOUNTER — Ambulatory Visit (INDEPENDENT_AMBULATORY_CARE_PROVIDER_SITE_OTHER): Payer: Medicaid Other | Admitting: Orthopaedic Surgery

## 2021-02-15 VITALS — BP 140/95 | HR 77 | Ht 61.5 in | Wt 118.0 lb

## 2021-02-15 DIAGNOSIS — M25562 Pain in left knee: Secondary | ICD-10-CM | POA: Diagnosis not present

## 2021-02-15 DIAGNOSIS — F17210 Nicotine dependence, cigarettes, uncomplicated: Secondary | ICD-10-CM

## 2021-02-15 DIAGNOSIS — G8929 Other chronic pain: Secondary | ICD-10-CM

## 2021-02-15 MED ORDER — HYDROCODONE-ACETAMINOPHEN 5-325 MG PO TABS: ORAL_TABLET | ORAL | 0 refills | Status: DC

## 2021-02-15 NOTE — Progress Notes (Signed)
PROCEDURE NOTE:  The patient requests injections of the left knee , verbal consent was obtained.  The left knee was prepped appropriately after time out was performed.   Sterile technique was observed and injection of 1 cc of Celestone 6 mg with several cc's of plain xylocaine. Anesthesia was provided by ethyl chloride and a 20-gauge needle was used to inject the knee area. The injection was tolerated well.  A band aid dressing was applied.  The patient was advised to apply ice later today and tomorrow to the injection sight as needed.   MRI in June 2021 was negative.   She has more pain she says in the knee but no giving way.  I told her we will see how the injection does.  If she is not better in a month, then consider new MRI.  She would need X-rays first.  She has no new trauma.  I have reviewed the [place] web site prior to prescribing narcotic medicine for this patient.  Call if any problem.  Precautions discussed.  Electronically Signed [name], MD 9/6/20221:50 PM

## 2021-02-21 ENCOUNTER — Telehealth: Payer: Self-pay | Admitting: Orthopaedic Surgery

## 2021-02-21 MED ORDER — HYDROCODONE-ACETAMINOPHEN 5-325 MG PO TABS: ORAL_TABLET | ORAL | 0 refills | Status: DC

## 2021-02-21 NOTE — Telephone Encounter (Signed)
Patient requests prescription for Hydrocodone/Acetaminophen 5-325 mgs.  Qty  12       Sig: One tablet every six hours for pain.  Limit 7 days.   [facility]

## 2021-02-21 NOTE — Addendum Note (Signed)
Addended by: Willette Pa on: 02/21/2021 10:30 PM   Modules accepted: Orders

## 2021-02-28 ENCOUNTER — Telehealth: Payer: Self-pay | Admitting: Orthopaedic Surgery

## 2021-02-28 MED ORDER — HYDROCODONE-ACETAMINOPHEN 5-325 MG PO TABS: ORAL_TABLET | ORAL | 0 refills | Status: DC

## 2021-02-28 NOTE — Telephone Encounter (Signed)
Patient called requesting refill for her pain medicine.    HYDROcodone-acetaminophen (NORCO/VICODIN) 5-325 MG tablet  [facility] [place]

## 2021-03-07 ENCOUNTER — Other Ambulatory Visit: Payer: Self-pay | Admitting: Radiology

## 2021-03-07 NOTE — Telephone Encounter (Signed)
Patient called, said she needed a refill of pain meds.  She was out in the yard, stepped wrong, increased pain in her knee.  Tried to go to work today and could not.  I scheduled her an appt tomorrow for eval, and she will need work note.    She is asking for refill hydrocodone today if possible.

## 2021-03-08 ENCOUNTER — Other Ambulatory Visit: Payer: Self-pay

## 2021-03-08 ENCOUNTER — Encounter: Payer: Self-pay | Admitting: Orthopaedic Surgery

## 2021-03-08 ENCOUNTER — Ambulatory Visit (INDEPENDENT_AMBULATORY_CARE_PROVIDER_SITE_OTHER): Payer: Medicaid Other | Admitting: Orthopaedic Surgery

## 2021-03-08 DIAGNOSIS — G8929 Other chronic pain: Secondary | ICD-10-CM | POA: Diagnosis not present

## 2021-03-08 DIAGNOSIS — F17210 Nicotine dependence, cigarettes, uncomplicated: Secondary | ICD-10-CM | POA: Diagnosis not present

## 2021-03-08 DIAGNOSIS — M25562 Pain in left knee: Secondary | ICD-10-CM | POA: Diagnosis not present

## 2021-03-08 MED ORDER — HYDROCODONE-ACETAMINOPHEN 5-325 MG PO TABS: ORAL_TABLET | ORAL | 0 refills | Status: DC

## 2021-03-08 NOTE — Progress Notes (Signed)
Virtual Visit via Telephone Note  I connected withNAME@ on 03/08/21 at  3:20 PM EDT by telephone and verified that I am speaking with the correct person using two identifiers.  Location: Patient: home Provider: office   I discussed the limitations, risks, security and privacy concerns of performing an evaluation and management service by telephone and the availability of in person appointments. I also discussed with the patient that there may be a patient responsible charge related to this service. The patient expressed understanding and agreed to proceed.   History of Present Illness: She fell and hurt her knee on the left during the weekend. She came by this morning but could not stay to be seen.  She is no better.  She has swelling and popping and giving way.   Observations/Objective: Per above.  Assessment and Plan: Encounter Diagnoses  Name Primary?   Chronic pain of left knee Yes   Cigarette nicotine dependence without complication       Follow Up Instructions: I can see tomorrow in [place] or here in [facility] Thursday morning.  I called in pain medicine   I discussed the assessment and treatment plan with the patient. The patient was provided an opportunity to ask questions and all were answered. The patient agreed with the plan and demonstrated an understanding of the instructions.   The patient was advised to call back or seek an in-person evaluation if the symptoms worsen or if the condition fails to improve as anticipated.  I provided 8 minutes of non-face-to-face time during this encounter.   [name], MD

## 2021-03-09 ENCOUNTER — Telehealth: Payer: Self-pay | Admitting: Radiology

## 2021-03-09 ENCOUNTER — Telehealth: Payer: Self-pay | Admitting: Orthopaedic Surgery

## 2021-03-09 NOTE — Telephone Encounter (Signed)
Patient came in the [place] office at 7:50 am and said she has an appointment with Dr. Luna Glasgow at 8:00 this morning.  I advised her no she does not.  Then she asked for a doctors note for today. I advised her I can't give her a doctors note she was not seen.  After she left I looked in her chart from yesterday and  it states she can see Dr. Luna Glasgow in [place] today or [place] on Thursday.  She said she doesn't have a ride to take her to [place] and her leg is killing her .  She then started hollering at me.  I told her to calm down I can schedule her something with Dr. Luna Glasgow I will have to look and see what I have.  She then started cursing at me and said forget it, why did he tell me to come to the [place] office today and then she  hung up!

## 2021-03-09 NOTE — Telephone Encounter (Signed)
Patients employer called asking about a work note for patient. I have advised her employer I need signed HIPAA in order to discuss a work note with the employer..  Patient states to employer we refused to give her a work note.  Patient is requesting a work note for yesterday she was here early this morning to discuss with Angela Nevin but no message was sent to Dr Trey Paula to provide note for out of work?

## 2021-03-10 ENCOUNTER — Ambulatory Visit (INDEPENDENT_AMBULATORY_CARE_PROVIDER_SITE_OTHER): Payer: Medicaid Other | Admitting: Orthopaedic Surgery

## 2021-03-10 ENCOUNTER — Encounter: Payer: Self-pay | Admitting: Orthopaedic Surgery

## 2021-03-10 ENCOUNTER — Ambulatory Visit: Payer: Medicaid Other | Admitting: Orthopaedic Surgery

## 2021-03-10 ENCOUNTER — Ambulatory Visit: Payer: Medicaid Other

## 2021-03-10 ENCOUNTER — Other Ambulatory Visit: Payer: Self-pay

## 2021-03-10 VITALS — BP 137/96 | HR 72 | Ht 61.5 in | Wt 121.0 lb

## 2021-03-10 DIAGNOSIS — G8929 Other chronic pain: Secondary | ICD-10-CM

## 2021-03-10 DIAGNOSIS — M25462 Effusion, left knee: Secondary | ICD-10-CM

## 2021-03-10 DIAGNOSIS — M25562 Pain in left knee: Secondary | ICD-10-CM | POA: Diagnosis not present

## 2021-03-10 DIAGNOSIS — W1842XA Slipping, tripping and stumbling without falling due to stepping into hole or opening, initial encounter: Secondary | ICD-10-CM | POA: Diagnosis not present

## 2021-03-10 NOTE — Progress Notes (Signed)
She stepped in a hole and the left knee was injured in a twisting motion.  I talked to her by phone two days ago. She is no better. She has swelling, pain and feeling it will give way.  Left knee has effusion, crepitus, limp to left, ROM 0 to 105, medial joint line pain, positive medial McMurray, no distal edema, NV intact.  Encounter Diagnosis  Name Primary?   Chronic pain of left knee Yes   X-rays were done of the left knee, reported separately.  PROCEDURE NOTE:  The patient requests injections of the left knee , verbal consent was obtained.  The left knee was prepped appropriately after time out was performed.   Sterile technique was observed and injection of 1 cc of Celestone 6 mg with several cc's of plain xylocaine. Anesthesia was provided by ethyl chloride and a 20-gauge needle was used to inject the knee area. The injection was tolerated well.  A band aid dressing was applied.  The patient was advised to apply ice later today and tomorrow to the injection sight as needed.   Continue her medicine.  Return in two weeks.Give note to be out of work.  She may need MRI.  Call if any problem.  Precautions discussed.  Electronically Signed [name], MD 9/29/20229:28 AM

## 2021-03-14 ENCOUNTER — Telehealth: Payer: Self-pay | Admitting: Radiology

## 2021-03-14 MED ORDER — HYDROCODONE-ACETAMINOPHEN 5-325 MG PO TABS: ORAL_TABLET | ORAL | 0 refills | Status: DC

## 2021-03-14 NOTE — Telephone Encounter (Signed)
Patient called, requested refill hydrocodone.

## 2021-03-15 ENCOUNTER — Ambulatory Visit: Payer: Medicaid Other | Admitting: Orthopaedic Surgery

## 2021-03-21 ENCOUNTER — Telehealth: Payer: Self-pay | Admitting: Orthopaedic Surgery

## 2021-03-21 ENCOUNTER — Encounter: Payer: Medicaid Other | Admitting: Internal Medicine

## 2021-03-21 NOTE — Telephone Encounter (Signed)
Patient called while we were closed for lunch and left a voicemail stating not to call her back because her phone is not working and it is time to refill her pain medicine. She did not leave the pharmacy to send it to.     HYDROcodone-acetaminophen (NORCO/VICODIN) 5-325 MG tablet

## 2021-03-22 MED ORDER — HYDROCODONE-ACETAMINOPHEN 5-325 MG PO TABS: ORAL_TABLET | ORAL | 0 refills | Status: DC

## 2021-03-22 NOTE — Telephone Encounter (Signed)
Hydrocodone-Acetaminophen 5-325 MG tablet   [facility]ES [facility]

## 2021-03-24 ENCOUNTER — Ambulatory Visit (INDEPENDENT_AMBULATORY_CARE_PROVIDER_SITE_OTHER): Payer: Medicaid Other | Admitting: Orthopaedic Surgery

## 2021-03-24 ENCOUNTER — Other Ambulatory Visit: Payer: Self-pay

## 2021-03-24 ENCOUNTER — Encounter: Payer: Self-pay | Admitting: Orthopaedic Surgery

## 2021-03-24 VITALS — BP 119/88 | HR 82 | Ht 61.5 in | Wt 120.2 lb

## 2021-03-24 DIAGNOSIS — F17210 Nicotine dependence, cigarettes, uncomplicated: Secondary | ICD-10-CM

## 2021-03-24 DIAGNOSIS — M79605 Pain in left leg: Secondary | ICD-10-CM | POA: Diagnosis not present

## 2021-03-24 DIAGNOSIS — M25562 Pain in left knee: Secondary | ICD-10-CM | POA: Diagnosis not present

## 2021-03-24 DIAGNOSIS — M545 Low back pain, unspecified: Secondary | ICD-10-CM | POA: Diagnosis not present

## 2021-03-24 DIAGNOSIS — G8929 Other chronic pain: Secondary | ICD-10-CM

## 2021-03-24 MED ORDER — NAPROXEN 500 MG PO TABS: 500.0000 mg | ORAL_TABLET | Freq: Two times a day (BID) | ORAL | 5 refills | Status: DC

## 2021-03-24 NOTE — Progress Notes (Signed)
My back is hurting now.  She has developed lower back pain with left sided sciatica and pain.  It comes and goes. She has no weakness, no trauma.  Her left knee is tender but less painful than before.  She has no giving way or locking.  Spine/Pelvis examination:  Inspection:  Overall, sacoiliac joint benign and hips nontender; without crepitus or defects.   Thoracic spine inspection: Alignment normal without kyphosis present   Lumbar spine inspection:  Alignment  with normal lumbar lordosis, without scoliosis apparent.   Thoracic spine palpation:  without tenderness of spinal processes   Lumbar spine palpation: without tenderness of lumbar area; without tightness of lumbar muscles    Range of Motion:   Lumbar flexion, forward flexion is normal without pain or tenderness    Lumbar extension is full without pain or tenderness   Left lateral bend is normal without pain or tenderness   Right lateral bend is normal without pain or tenderness   Straight leg raising is normal  Strength & tone: normal   Stability overall normal stability  Left knee has near full ROM today and crepitus, slight effusion, stable.  Encounter Diagnoses  Name Primary?   Lumbar pain with radiation down left leg Yes   Chronic pain of left knee    Cigarette nicotine dependence without complication    I will give Naprosyn 500 po bid pc  Return in one month.  Call if any problem.  Precautions discussed.  Electronically Signed [name], MD 10/13/20228:31 AM

## 2021-03-28 ENCOUNTER — Telehealth: Payer: Self-pay | Admitting: Orthopaedic Surgery

## 2021-03-28 MED ORDER — HYDROCODONE-ACETAMINOPHEN 5-325 MG PO TABS: ORAL_TABLET | ORAL | 0 refills | Status: DC

## 2021-03-28 NOTE — Telephone Encounter (Signed)
Patient called left message requesting refill for her pain medicine.    HYDROcodone-acetaminophen (NORCO/VICODIN) 5-325 MG tablet  [facility]

## 2021-03-31 ENCOUNTER — Telehealth: Payer: Self-pay

## 2021-03-31 NOTE — Telephone Encounter (Signed)
Hydrocodone-Acetaminophen  5/325 mg Qty 12 Tablets  PATIENT USES [facility] [facility]

## 2021-04-04 ENCOUNTER — Telehealth: Payer: Self-pay | Admitting: Orthopaedic Surgery

## 2021-04-04 MED ORDER — HYDROCODONE-ACETAMINOPHEN 5-325 MG PO TABS: ORAL_TABLET | ORAL | 0 refills | Status: DC

## 2021-04-04 NOTE — Telephone Encounter (Signed)
Patient requests refill - aware that while Dr Luna Glasgow is out of clinic this week, our provider on call is reviewing refill requests: HYDROcodone-acetaminophen (NORCO/VICODIN) 5-325 MG tablet       [facility] [address] [place]

## 2021-04-11 ENCOUNTER — Telehealth: Payer: Self-pay

## 2021-04-11 MED ORDER — HYDROCODONE-ACETAMINOPHEN 5-325 MG PO TABS: ORAL_TABLET | ORAL | 0 refills | Status: DC

## 2021-04-11 NOTE — Telephone Encounter (Signed)
Hydrocodone-Acetaminophen  5/325 mg   PATIENT USES [facility]

## 2021-04-12 ENCOUNTER — Ambulatory Visit: Payer: Medicaid Other | Admitting: Orthopaedic Surgery

## 2021-04-14 ENCOUNTER — Encounter: Payer: Self-pay | Admitting: Orthopaedic Surgery

## 2021-04-14 ENCOUNTER — Other Ambulatory Visit: Payer: Self-pay

## 2021-04-14 ENCOUNTER — Ambulatory Visit (INDEPENDENT_AMBULATORY_CARE_PROVIDER_SITE_OTHER): Payer: Medicaid Other | Admitting: Orthopaedic Surgery

## 2021-04-14 VITALS — BP 145/91 | HR 70 | Ht 61.5 in | Wt 120.0 lb

## 2021-04-14 DIAGNOSIS — M25562 Pain in left knee: Secondary | ICD-10-CM | POA: Diagnosis not present

## 2021-04-14 DIAGNOSIS — G8929 Other chronic pain: Secondary | ICD-10-CM | POA: Diagnosis not present

## 2021-04-14 NOTE — Patient Instructions (Signed)
OOW note for 1 month

## 2021-04-14 NOTE — Progress Notes (Signed)
I fell at work.  She had an injury at work at [employer] [facility] [employer] on 04-12-21 around 9:45.  She reported it to Ms. Sharonicia.  She was cleaning up and her knee gave out and she fell and hit her left leg and went backwards.  The left knee has been hurting more since then.  It is giving way.  She has some back pain as well.  Left knee has effusion, crepitus, medial joint line pain, positive medial McMurray, limp left, NV intact, no distal edema.  Encounter Diagnosis  Name Primary?   Chronic pain of left knee Yes   PROCEDURE NOTE:  The patient requests injections of the left knee , verbal consent was obtained.  The left knee was prepped appropriately after time out was performed.   Sterile technique was observed and injection of 1 cc of DepoMedrol 40 mg with several cc's of plain xylocaine. Anesthesia was provided by ethyl chloride and a 20-gauge needle was used to inject the knee area. The injection was tolerated well.  A band aid dressing was applied.  The patient was advised to apply ice later today and tomorrow to the injection sight as needed.  I will get MRI of the left knee.  I am concerned about medial meniscus tear.  Stay out of work.  Return in three to four weeks.  Call if any problem. Continue her pain medicines and other medicines.   Precautions discussed.  Electronically Signed [name], MD 11/3/202210:46 AM

## 2021-04-18 ENCOUNTER — Telehealth: Payer: Self-pay | Admitting: Orthopaedic Surgery

## 2021-04-18 ENCOUNTER — Other Ambulatory Visit: Payer: Self-pay

## 2021-04-18 DIAGNOSIS — M25562 Pain in left knee: Secondary | ICD-10-CM

## 2021-04-18 DIAGNOSIS — G8929 Other chronic pain: Secondary | ICD-10-CM

## 2021-04-18 MED ORDER — HYDROCODONE-ACETAMINOPHEN 5-325 MG PO TABS: ORAL_TABLET | ORAL | 0 refills | Status: DC

## 2021-04-18 NOTE — Telephone Encounter (Signed)
Patient requests refill: HYDROcodone-acetaminophen (NORCO/VICODIN) 5-325 MG tablet 12 tablet      [facility] [place]

## 2021-04-25 ENCOUNTER — Telehealth: Payer: Self-pay

## 2021-04-25 MED ORDER — HYDROCODONE-ACETAMINOPHEN 5-325 MG PO TABS: ORAL_TABLET | ORAL | 0 refills | Status: DC

## 2021-04-25 NOTE — Telephone Encounter (Signed)
Hydrocodone-Acetaminophen  5/325 mg  Qty 12 Tablets  [facility]

## 2021-04-26 ENCOUNTER — Ambulatory Visit: Payer: Medicaid Other | Admitting: Orthopaedic Surgery

## 2021-05-02 ENCOUNTER — Telehealth: Payer: Self-pay | Admitting: Orthopaedic Surgery

## 2021-05-02 MED ORDER — HYDROCODONE-ACETAMINOPHEN 5-325 MG PO TABS: ORAL_TABLET | ORAL | 0 refills | Status: DC

## 2021-05-02 NOTE — Telephone Encounter (Signed)
Patient requests refill: HYDROcodone-acetaminophen (NORCO/VICODIN) 5-325 MG tablet 12 tablet      [facility] [place]

## 2021-05-04 ENCOUNTER — Other Ambulatory Visit: Payer: Self-pay

## 2021-05-04 ENCOUNTER — Ambulatory Visit (HOSPITAL_COMMUNITY)
Admission: RE | Admit: 2021-05-04 | Discharge: 2021-05-04 | Disposition: A | Discharge status: Home / Self Care | Payer: Medicaid Other | Source: Ambulatory Visit | Attending: Orthopaedic Surgery | Admitting: Orthopaedic Surgery

## 2021-05-04 DIAGNOSIS — M25562 Pain in left knee: Secondary | ICD-10-CM

## 2021-05-04 DIAGNOSIS — G8929 Other chronic pain: Secondary | ICD-10-CM | POA: Insufficient documentation

## 2021-05-09 ENCOUNTER — Ambulatory Visit (INDEPENDENT_AMBULATORY_CARE_PROVIDER_SITE_OTHER): Payer: Medicaid Other | Admitting: Adult Health

## 2021-05-09 ENCOUNTER — Other Ambulatory Visit: Payer: Self-pay

## 2021-05-09 ENCOUNTER — Encounter: Payer: Self-pay | Admitting: Adult Health

## 2021-05-09 VITALS — BP 112/77 | HR 79 | Ht 61.5 in | Wt 123.5 lb

## 2021-05-09 DIAGNOSIS — N6323 Unspecified lump in the left breast, lower outer quadrant: Secondary | ICD-10-CM | POA: Diagnosis not present

## 2021-05-09 DIAGNOSIS — N644 Mastodynia: Secondary | ICD-10-CM | POA: Diagnosis not present

## 2021-05-09 NOTE — Progress Notes (Signed)
  Subjective:     Patient ID: Judy Cross, female   DOB: [birth year], 55 y.o.   MRN: [id number]  HPI Lurie is a 55 year old black female,single, PM in complaining of pain in left breast. PCP is Dr Posey Pronto. Lab Results  Component Value Date   DIAGPAP  10/12/2020    - Negative for intraepithelial lesion or malignancy (NILM)   [place] Negative 10/12/2020    Review of Systems Pain in left breast' Reviewed past medical,surgical, social and family history. Reviewed medications and allergies.     Objective:   Physical Exam BP 112/77 (BP Location: Left Arm, Patient Position: Sitting, Cuff Size: Normal)   Pulse 79   Ht 5' 1.5" (1.562 m)   Wt 123 lb 8 oz (56 kg)   BMI 22.96 kg/m      Skin warm and dry,  Breasts:no dominate palpable mass, retraction or nipple discharge on the right, on the left has tenderness at nipple and LOQ, has pea sized mass at 3-4 o'clock, no redness, retraction or nipple discharge. Fall risk is moderate  [facility] - 05/09/21 1018       Pregnancy Intention Screening   Does the patient want to become pregnant in the next year? No    Does the patient's partner want to become pregnant in the next year? No    Would the patient like to discuss contraceptive options today? No      Contraception Wrap Up   Current Method Female Sterilization    End Method Female Sterilization    Contraception Counseling Provided No             Assessment:     1. Breast pain, left -Will get diagnostic left mammogram and [place] at [facility] 06/07/21 at 11:20 am  - MM DIAG BREAST TOMO UNI LEFT; Future - US BREAST LTD UNI LEFT INC AXILLA; Future Leave breast alone  2. Mass of lower outer quadrant of left breast - MM DIAG BREAST TOMO UNI LEFT; Future - US BREAST LTD UNI LEFT INC AXILLA; Future    -diagnostic mammogram and [place] 06/07/21 at [facility]:     Follow up prn

## 2021-05-10 ENCOUNTER — Telehealth: Payer: Self-pay | Admitting: Orthopaedic Surgery

## 2021-05-10 ENCOUNTER — Ambulatory Visit (INDEPENDENT_AMBULATORY_CARE_PROVIDER_SITE_OTHER): Payer: Medicaid Other | Admitting: Orthopaedic Surgery

## 2021-05-10 ENCOUNTER — Encounter: Payer: Self-pay | Admitting: Orthopaedic Surgery

## 2021-05-10 DIAGNOSIS — M25562 Pain in left knee: Secondary | ICD-10-CM

## 2021-05-10 DIAGNOSIS — G8929 Other chronic pain: Secondary | ICD-10-CM

## 2021-05-10 DIAGNOSIS — F17210 Nicotine dependence, cigarettes, uncomplicated: Secondary | ICD-10-CM | POA: Diagnosis not present

## 2021-05-10 MED ORDER — HYDROCODONE-ACETAMINOPHEN 5-325 MG PO TABS: ORAL_TABLET | ORAL | 0 refills | Status: DC

## 2021-05-10 NOTE — Telephone Encounter (Signed)
Patient states medication not at   [facility] [place]:  HYDROcodone-acetaminophen (NORCO/VICODIN) 5-325 MG tablet 12 tablet

## 2021-05-10 NOTE — Patient Instructions (Signed)
Steps to Quit Smoking Smoking tobacco is the leading cause of preventable death. It can affect almost every organ in the body. Smoking puts you and people around you at risk for many serious, long-lasting (chronic) diseases. Quitting smoking can be hard, but it is one of the best things that you can do for your health. It is never too late to quit. How do I get ready to quit? When you decide to quit smoking, make a plan to help you succeed. Before you quit: Pick a date to quit. Set a date within the next 2 weeks to give you time to prepare. Write down the reasons why you are quitting. Keep this list in places where you will see it often. Tell your family, friends, and co-workers that you are quitting. Their support is important. Talk with your doctor about the choices that may help you quit. Find out if your health insurance will pay for these treatments. Know the people, places, things, and activities that make you want to smoke (triggers). Avoid them. What first steps can I take to quit smoking? Throw away all cigarettes at home, at work, and in your car. Throw away the things that you use when you smoke, such as ashtrays and lighters. Clean your car. Make sure to empty the ashtray. Clean your home, including curtains and carpets. What can I do to help me quit smoking? Talk with your doctor about taking medicines and seeing a counselor at the same time. You are more likely to succeed when you do both. If you are pregnant or breastfeeding, talk with your doctor about counseling or other ways to quit smoking. Do not take medicine to help you quit smoking unless your doctor tells you to do so. To quit smoking: Quit right away Quit smoking totally, instead of slowly cutting back on how much you smoke over a period of time. Go to counseling. You are more likely to quit if you go to counseling sessions regularly. Take medicine You may take medicines to help you quit. Some medicines need a  prescription, and some you can buy over-the-counter. Some medicines may contain a drug called nicotine to replace the nicotine in cigarettes. Medicines may: Help you to stop having the desire to smoke (cravings). Help to stop the problems that come when you stop smoking (withdrawal symptoms). Your doctor may ask you to use: Nicotine patches, gum, or lozenges. Nicotine inhalers or sprays. Non-nicotine medicine that is taken by mouth. Find resources Find resources and other ways to help you quit smoking and remain smoke-free after you quit. These resources are most helpful when you use them often. They include: Online chats with a counselor. Phone quitlines. Printed self-help materials. Support groups or group counseling. Text messaging programs. Mobile phone apps. Use apps on your mobile phone or tablet that can help you stick to your quit plan. There are many free apps for mobile phones and tablets as well as websites. Examples include Quit Guide from the CDC and smokefree.gov  What things can I do to make it easier to quit?  Talk to your family and friends. Ask them to support and encourage you. Call a phone quitline (1-800-QUIT-NOW), reach out to support groups, or work with a counselor. Ask people who smoke to not smoke around you. Avoid places that make you want to smoke, such as: Bars. Parties. Smoke-break areas at work. Spend time with people who do not smoke. Lower the stress in your life. Stress can make you want to   smoke. Try these things to help your stress: Getting regular exercise. Doing deep-breathing exercises. Doing yoga. Meditating. Doing a body scan. To do this, close your eyes, focus on one area of your body at a time from head to toe. Notice which parts of your body are tense. Try to relax the muscles in those areas. How will I feel when I quit smoking? Day 1 to 3 weeks Within the first 24 hours, you may start to have some problems that come from quitting tobacco.  These problems are very bad 2-3 days after you quit, but they do not often last for more than 2-3 weeks. You may get these symptoms: Mood swings. Feeling restless, nervous, angry, or annoyed. Trouble concentrating. Dizziness. Strong desire for high-sugar foods and nicotine. Weight gain. Trouble pooping (constipation). Feeling like you may vomit (nausea). Coughing or a sore throat. Changes in how the medicines that you take for other issues work in your body. Depression. Trouble sleeping (insomnia). Week 3 and afterward After the first 2-3 weeks of quitting, you may start to notice more positive results, such as: Better sense of smell and taste. Less coughing and sore throat. Slower heart rate. Lower blood pressure. Clearer skin. Better breathing. Fewer sick days. Quitting smoking can be hard. Do not give up if you fail the first time. Some people need to try a few times before they succeed. Do your best to stick to your quit plan, and talk with your doctor if you have any questions or concerns. Summary Smoking tobacco is the leading cause of preventable death. Quitting smoking can be hard, but it is one of the best things that you can do for your health. When you decide to quit smoking, make a plan to help you succeed. Quit smoking right away, not slowly over a period of time. When you start quitting, seek help from your doctor, family, or friends. This information is not intended to replace advice given to you by your health care provider. Make sure you discuss any questions you have with your health care provider. Document Revised: 02/04/2021 Document Reviewed: 08/17/2018 Elsevier Patient Education  2022 Elsevier Inc.  

## 2021-05-10 NOTE — Progress Notes (Signed)
My knee still hurts.  She has left knee pain.  She had MRI which showed: IMPRESSION: 1. No acute findings or evidence of internal derangement. The menisci, cruciate and collateral ligaments remain intact. 2. 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 No signs of transient patellar dislocation injury.  I have explained the findings to her.    I have independently reviewed the MRI.    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  ROM of the left knee is 0 to 105, crepitus, effusion, medial joint pain, stable.  Limp left.  Encounter Diagnoses  Name Primary?   Chronic pain of left knee Yes   Cigarette nicotine dependence without complication    Return in six weeks.  Call if any problem.  Precautions discussed.  Electronically Signed [name], MD 11/29/20229:11 AM

## 2021-05-12 ENCOUNTER — Ambulatory Visit: Payer: Medicaid Other | Admitting: Orthopaedic Surgery

## 2021-05-16 ENCOUNTER — Telehealth: Payer: Self-pay | Admitting: Orthopaedic Surgery

## 2021-05-16 MED ORDER — HYDROCODONE-ACETAMINOPHEN 5-325 MG PO TABS: ORAL_TABLET | ORAL | 0 refills | Status: DC

## 2021-05-16 NOTE — Addendum Note (Signed)
Addended by: Willette Pa on: 05/16/2021 06:58 PM   Modules accepted: Orders

## 2021-05-16 NOTE — Telephone Encounter (Signed)
Patient called for refill: HYDROcodone-acetaminophen (NORCO/VICODIN) 5-325 MG tablet 12 tablet      [facility] [place]

## 2021-05-18 ENCOUNTER — Telehealth: Payer: Self-pay | Admitting: Radiology

## 2021-05-18 NOTE — Telephone Encounter (Signed)
Patient called, said she needs a note to return to work.  Wants it to be dated today.  Please call her once done.  Wants to get is ASAP.

## 2021-05-19 ENCOUNTER — Encounter: Payer: Self-pay | Admitting: Orthopaedic Surgery

## 2021-05-23 ENCOUNTER — Telehealth: Payer: Self-pay

## 2021-05-23 MED ORDER — HYDROCODONE-ACETAMINOPHEN 5-325 MG PO TABS: ORAL_TABLET | ORAL | 0 refills | Status: DC

## 2021-05-23 NOTE — Telephone Encounter (Signed)
Hydrocodone-Acetaminophen 5/325 mg   PATIENT USE [facility]

## 2021-05-27 ENCOUNTER — Telehealth: Payer: Self-pay | Admitting: Orthopaedic Surgery

## 2021-05-27 NOTE — Telephone Encounter (Incomplete Revision)
Call and fax received from [place], Short term group life and disability department, fax [phone] / [phone]. Additional information is being requested. Placed in Dr Brooke Bonito box.* *Patient had provided form directly to Dr Luna Glasgow to fill out; signed authorization included; no record of [employer] form/fee on file. Scanned in the original form - we will re-scan after Dr Brooke Bonito review.

## 2021-05-27 NOTE — Telephone Encounter (Addendum)
Call and fax received from [place], Short term group life and disability department, fax [phone] / [phone]. Additional information is being requested. Placed in Dr Brooke Bonito box.* *Patient had provided form directly to Dr Luna Glasgow to fill out; signed authorization included; no record of [employer] form/fee on file. Scanned in the original form - we will re-scan after Dr Brooke Bonito review.

## 2021-05-30 ENCOUNTER — Telehealth: Payer: Self-pay | Admitting: Orthopaedic Surgery

## 2021-05-30 NOTE — Telephone Encounter (Signed)
Patient called for refill HYDROcodone-acetaminophen (NORCO/VICODIN) 5-325 MG tablet 12 tablet       [facility] [place]

## 2021-05-31 MED ORDER — HYDROCODONE-ACETAMINOPHEN 5-325 MG PO TABS: ORAL_TABLET | ORAL | 0 refills | Status: DC

## 2021-06-07 ENCOUNTER — Ambulatory Visit (HOSPITAL_COMMUNITY)
Admission: RE | Admit: 2021-06-07 | Discharge: 2021-06-07 | Disposition: A | Discharge status: Home / Self Care | Payer: Medicaid Other | Source: Ambulatory Visit | Attending: Adult Health | Admitting: Adult Health

## 2021-06-07 ENCOUNTER — Other Ambulatory Visit: Payer: Self-pay

## 2021-06-07 DIAGNOSIS — N6323 Unspecified lump in the left breast, lower outer quadrant: Secondary | ICD-10-CM | POA: Insufficient documentation

## 2021-06-07 DIAGNOSIS — N644 Mastodynia: Secondary | ICD-10-CM | POA: Insufficient documentation

## 2021-06-07 MED ORDER — HYDROCODONE-ACETAMINOPHEN 5-325 MG PO TABS: ORAL_TABLET | ORAL | 0 refills | Status: DC

## 2021-06-07 NOTE — Telephone Encounter (Signed)
Dr. Brooke Bonito patient is asking for refill  Hydrocodone-Acetaminophen 5/325 mg  Qty 12 tablets  Take one tablet every six hours for pain. Limit 5 days  [facility]IENT [facility]ES [facility] [facility]

## 2021-06-14 ENCOUNTER — Telehealth: Payer: Self-pay

## 2021-06-14 MED ORDER — HYDROCODONE-ACETAMINOPHEN 5-325 MG PO TABS: ORAL_TABLET | ORAL | 0 refills | Status: DC

## 2021-06-14 NOTE — Telephone Encounter (Signed)
Hydrocodone-Acetaminophen 5/325 mg  Qty 12 Tablets  PATIENT USES [facility]

## 2021-06-17 ENCOUNTER — Encounter: Payer: Self-pay | Admitting: Internal Medicine

## 2021-06-17 ENCOUNTER — Other Ambulatory Visit: Payer: Self-pay

## 2021-06-17 ENCOUNTER — Ambulatory Visit (INDEPENDENT_AMBULATORY_CARE_PROVIDER_SITE_OTHER): Payer: Medicaid Other | Admitting: Internal Medicine

## 2021-06-17 VITALS — BP 130/88 | HR 74 | Ht 61.0 in | Wt 125.1 lb

## 2021-06-17 DIAGNOSIS — M48061 Spinal stenosis, lumbar region without neurogenic claudication: Secondary | ICD-10-CM

## 2021-06-17 MED ORDER — METHYLPREDNISOLONE ACETATE 80 MG/ML IJ SUSP
80.0000 mg | Freq: Once | INTRAMUSCULAR | Status: AC
  Administered 2021-06-17: 80 mg via INTRAMUSCULAR

## 2021-06-17 MED ORDER — KETOROLAC TROMETHAMINE 60 MG/2ML IM SOLN
60.0000 mg | Freq: Once | INTRAMUSCULAR | Status: AC
  Administered 2021-06-17: 60 mg via INTRAMUSCULAR

## 2021-06-17 NOTE — Patient Instructions (Signed)
Please continue taking medications as prescribed.  You are being referred to Spine surgeon.

## 2021-06-17 NOTE — Assessment & Plan Note (Signed)
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

## 2021-06-17 NOTE — Progress Notes (Signed)
Acute Office Visit  Subjective:    Patient ID: Judy Cross, female    DOB: 1965/12/08, 56 y.o.   MRN: [id number]  Chief Complaint  Patient presents with   Knee Pain   Back Pain    Knee/back pain    HPI Patient is in today for complaint of chronic knee and low back pain, which is worse with movement, bending and prolonged standing.  She has seen Dr. Luna Glasgow for chronic knee pain and has had steroid injections with minimal relief.  She also takes Norco as needed for severe pain.  She has had MRI of the knee recently, which did not show major signs of arthritis or ligament tear, but did show mild swelling.  She works 2 jobs and has to stand on a hard floor for a long time.  She has been having difficulty standing due to chronic low back pain.  She denies any numbness or tingling of the feet, but does report weakness of the LE, which is getting worse now.  Denies any recent fall or injury.  Past Medical History:  Diagnosis Date   Allergy    Anxiety 06/12/2019   Arthritis    Asthma in adult, mild intermittent, uncomplicated 0/03/2724   Breast nodule 10/12/2015   Breast nodule 10/12/2015   Bulge of lumbar disc without myelopathy    Endometrial polyp 04/28/2013   Enlarged uterus 03/26/2013   Fibroids 04/28/2013   Hot flashes 03/26/2013   Hot flashes 03/26/2013   Hypertension    Irregular bleeding 03/26/2013   Neuromuscular disorder ([place])    sciatica   Seasonal allergies    Seizures ([place])    last seizure was 2 years ago; unknown etiology. On Keppra.   Trichomoniasis 10/20/2020   tx 10/20/20  POC___    Past Surgical History:  Procedure Laterality Date   ABLATION     with polyp removal.   BREAST BIOPSY Left    neg   CESAREAN SECTION     x 3   COLONOSCOPY WITH PROPOFOL N/A 01/11/2016   Procedure: COLONOSCOPY WITH PROPOFOL;  Surgeon: Danie Binder, MD;  Location: [facility];  Service: Endoscopy;  Laterality: N/A;  800   EXAMINATION UNDER ANESTHESIA  02/28/2012   Procedure:  EXAM UNDER ANESTHESIA;  Surgeon: Donato Heinz, MD;  Location: [facility];  Service: General;  Laterality: N/A;   EXCISION MASS UPPER EXTREMETIES Left 11/13/2019   Procedure: EXCISION MASS UPPER EXTREMETIES ring finger left;  Surgeon: Carole Civil, MD;  Location: [facility];  Service: Orthopedics;  Laterality: Left;   GANGLION CYST EXCISION Left    HYSTEROSCOPY WITH D & C N/A 05/23/2013   Procedure: DILATATION AND CURETTAGE /HYSTEROSCOPY;  Surgeon: Jonnie Kind, MD;  Location: [facility];  Service: Gynecology;  Laterality: N/A;   POLYPECTOMY N/A 05/23/2013   Procedure: ENDOMETRIAL POLYP REMOVAL;  Surgeon: Jonnie Kind, MD;  Location: [facility];  Service: Gynecology;  Laterality: N/A;   SPHINCTEROTOMY  02/28/2012   Procedure: SPHINCTEROTOMY;  Surgeon: Donato Heinz, MD;  Location: [facility];  Service: General;  Laterality: N/A;  Lateral Internal Sphincterotomy   TRIGGER FINGER RELEASE Left    ring finger   TUBAL LIGATION      Family History  Problem Relation Age of Onset   Hypertension Mother    Arthritis Mother    COPD Mother    Diabetes Mother    Heart disease Mother    Hypertension Father    Parkinson's disease Father  Colon cancer Neg Hx     Social History   Socioeconomic History   Marital status: Single    Spouse name: Not on file   Number of children: 3   Years of education: 14   Highest education level: Not on file  Occupational History   Occupation: home health aid  Tobacco Use   Smoking status: Some Days    Packs/day: 0.25    Years: 20.00    Pack years: 5.00    Types: Cigarettes   Smokeless tobacco: Never   Tobacco comments:    smokes 1 cig daily  Vaping Use   Vaping Use: Never used  Substance and Sexual Activity   Alcohol use: Yes    Comment: occasional   Drug use: No   Sexual activity: Yes    Birth control/protection: Surgical    Comment: tubal and ablation  Other Topics Concern   Not on file  Social History Narrative   Degree in child care    Currently is a home health aid   Lives at home with youngest daughter Jeanella Craze senior this year    Son 24-Jalon  in college for sociology    Daughter [place]      Enjoys: write, reading, cooking, drawing      Diet: eats all food groups   Caffeine: pepsi weekly    Water: 6-8 cups    Juice some times      Wears seat belt    Smoke detectors    Does not use phone with driving.   Social Determinants of Health   Financial Resource Strain: Low Risk    Difficulty of Paying Living Expenses: Not very hard  Food Insecurity: Food Insecurity Present   Worried About [place] in the Last Year: Sometimes true   Arboriculturist in the Last Year: Often true  Transportation Needs: No Transportation Needs   Lack of Transportation (Medical): No   Lack of Transportation (Non-Medical): No  Physical Activity: Sufficiently Active   Days of Exercise per Week: 5 days   Minutes of Exercise per Session: 40 min  Stress: Stress Concern Present   Feeling of Stress : Very much  Social Connections: Moderately Isolated   Frequency of Communication with Friends and Family: Three times a week   Frequency of Social Gatherings with Friends and Family: Once a week   Attends Religious Services: More than 4 times per year   Active Member of [employer] or Organizations: No   Attends Music therapist: Never   Marital Status: Never married  Human resources officer Violence: Not At Risk   Fear of Current or Ex-Partner: No   Emotionally Abused: No   Physically Abused: No   Sexually Abused: No    Outpatient Medications Prior to Visit  Medication Sig Dispense Refill   Acetaminophen (TYLENOL PO) Take by mouth.     albuterol (PROAIR HFA) 108 (90 Base) MCG/ACT inhaler INHALE 1-2 PUFFS BY MOUTH EVERY 6 HOURS AS NEEDED FOR WHEEZE OR SHORTNESS OF BREATHMNT 8.5 each 0   cyclobenzaprine (FLEXERIL) 10 MG tablet Take 1 tablet (10 mg total) by mouth 2 (two) times daily as needed for  muscle spasms. 20 tablet 0   hydrochlorothiazide (HYDRODIURIL) 25 MG tablet TAKE 1 TABLET BY MOUTH EVERY DAY 90 tablet 0   HYDROcodone-acetaminophen (NORCO/VICODIN) 5-325 MG tablet One tablet every six hours for pain.  Limit 5 days. 12 tablet 0   ibuprofen (ADVIL) 600 MG tablet Take  1 tablet (600 mg total) by mouth every 8 (eight) hours as needed for moderate pain. 30 tablet 0   levocetirizine (XYZAL) 5 MG tablet Take 1 tablet (5 mg total) by mouth every evening. 30 tablet 5   naproxen (NAPROSYN) 500 MG tablet Take 1 tablet (500 mg total) by mouth 2 (two) times daily with a meal. 60 tablet 5   No facility-administered medications prior to visit.    Allergies  Allergen Reactions   Lisinopril Swelling    swelling to entire mouth.   Penicillins Rash    Has patient had a PCN reaction causing immediate rash, facial/tongue/throat swelling, SOB or lightheadedness with hypotension: no - next day Has patient had a PCN reaction causing severe rash involving mucus membranes or skin necrosis: No Has patient had a PCN reaction that required hospitalization No Has patient had a PCN reaction occurring within the last 10 years: Yes If all of the above answers are "NO", then may proceed with Cephalosporin use.    Tramadol Other (See Comments)    dizziness    Review of Systems  Constitutional:  Positive for fatigue. Negative for chills and fever.  HENT:  Negative for postnasal drip, sinus pressure, sinus pain, sore throat and trouble swallowing.   Respiratory:  Negative for cough and shortness of breath.   Cardiovascular:  Negative for chest pain and palpitations.  Musculoskeletal:  Positive for arthralgias and back pain.  Skin:  Negative for rash.  Neurological:  Negative for weakness and numbness.      Objective:    Physical Exam Vitals reviewed.  Constitutional:      General: She is not in acute distress.    Appearance: She is not diaphoretic.  HENT:     Head: Normocephalic and  atraumatic.     Nose: Nose normal.     Mouth/Throat:     Mouth: Mucous membranes are moist.  Eyes:     General: No scleral icterus.    Extraocular Movements: Extraocular movements intact.  Cardiovascular:     Rate and Rhythm: Normal rate and regular rhythm.     Pulses: Normal pulses.     Heart sounds: Normal heart sounds. No murmur heard. Pulmonary:     Breath sounds: Normal breath sounds. No wheezing or rales.  Musculoskeletal:        General: Tenderness (Left knee, with minimal swelling) present.     Cervical back: Neck supple. No tenderness.     Right lower leg: No edema.     Left lower leg: No edema.     Comments: Lumbar spine area tenderness  Skin:    General: Skin is warm.     Findings: No rash.  Neurological:     General: No focal deficit present.     Mental Status: She is alert and oriented to person, place, and time.  Psychiatric:        Mood and Affect: Mood normal.        Behavior: Behavior normal.    BP 130/88    Pulse 74    Ht 5\' 1"  (1.549 m)    Wt 125 lb 1.9 oz (56.8 kg)    SpO2 97%    BMI 23.64 kg/m  Wt Readings from Last 3 Encounters:  06/17/21 125 lb 1.9 oz (56.8 kg)  05/09/21 123 lb 8 oz (56 kg)  04/14/21 120 lb (54.4 kg)        Assessment & Plan:   Problem List Items Addressed This Visit  Other   Spinal stenosis of lumbar region - Primary    MRI lumbar spine in the past showed spinal stenosis and DDD of lumbar spine Her knee and leg pain could be radicular pain from spinal stenosis Depo-Medrol and Toradol given in the office today Referred to spine surgery      Relevant Orders   Ambulatory referral to Spine Surgery     Meds ordered this encounter  Medications   methylPREDNISolone acetate (DEPO-MEDROL) injection 80 mg   ketorolac (TORADOL) injection 60 mg     Lindell Spar, MD

## 2021-06-21 ENCOUNTER — Ambulatory Visit (INDEPENDENT_AMBULATORY_CARE_PROVIDER_SITE_OTHER): Payer: Medicaid Other | Admitting: Orthopaedic Surgery

## 2021-06-21 ENCOUNTER — Other Ambulatory Visit: Payer: Self-pay

## 2021-06-21 ENCOUNTER — Encounter: Payer: Self-pay | Admitting: Orthopaedic Surgery

## 2021-06-21 VITALS — BP 111/75 | HR 71 | Ht 61.5 in | Wt 124.8 lb

## 2021-06-21 DIAGNOSIS — M545 Low back pain, unspecified: Secondary | ICD-10-CM | POA: Diagnosis not present

## 2021-06-21 DIAGNOSIS — F17210 Nicotine dependence, cigarettes, uncomplicated: Secondary | ICD-10-CM | POA: Diagnosis not present

## 2021-06-21 DIAGNOSIS — G8929 Other chronic pain: Secondary | ICD-10-CM

## 2021-06-21 DIAGNOSIS — M25562 Pain in left knee: Secondary | ICD-10-CM | POA: Diagnosis not present

## 2021-06-21 DIAGNOSIS — M79605 Pain in left leg: Secondary | ICD-10-CM

## 2021-06-21 MED ORDER — HYDROCODONE-ACETAMINOPHEN 5-325 MG PO TABS: ORAL_TABLET | ORAL | 0 refills | Status: DC

## 2021-06-21 NOTE — Progress Notes (Signed)
My knee hurts and my back hurts.  She saw Dr. Posey Pronto and he has referred her to neurosurgery for the spinal stenosis she has.  I have reviewed the notes.  She has lower back pain with left sided sciatica.  Her left knee is more painful with the cold weather.  She has no new trauma.  Left knee has ROM 0 to 110, limp left, stable, crepitus, slight effusion, NV intact.  Encounter Diagnoses  Name Primary?   Chronic pain of left knee Yes   Cigarette nicotine dependence without complication    Lumbar pain with radiation down left leg    I have refilled her pain medicine. I have reviewed the [place] web site prior to prescribing narcotic medicine for this patient.  Return in six weeks.  Call if any problem.  Precautions discussed.  Electronically Signed [name], MD 1/10/20239:32 AM

## 2021-06-27 ENCOUNTER — Telehealth: Payer: Self-pay | Admitting: Radiology

## 2021-06-27 ENCOUNTER — Telehealth: Payer: Self-pay

## 2021-06-27 NOTE — Telephone Encounter (Signed)
Patient called said she has contacted [facility] and they have not received no referrals from our office.

## 2021-06-27 NOTE — Telephone Encounter (Signed)
Patient called back and said she called the practice and Dr Selinda Michaels, MD only practices in [place] not [place]. Would like to see a doctor in [place]. Call patient back at [phone] if need to.

## 2021-06-28 MED ORDER — HYDROCODONE-ACETAMINOPHEN 5-325 MG PO TABS: ORAL_TABLET | ORAL | 0 refills | Status: DC

## 2021-07-04 ENCOUNTER — Other Ambulatory Visit: Payer: Self-pay

## 2021-07-04 ENCOUNTER — Telehealth: Payer: Self-pay | Admitting: Orthopaedic Surgery

## 2021-07-04 DIAGNOSIS — F419 Anxiety disorder, unspecified: Secondary | ICD-10-CM

## 2021-07-04 MED ORDER — HYDROCODONE-ACETAMINOPHEN 5-325 MG PO TABS: ORAL_TABLET | ORAL | 0 refills | Status: DC

## 2021-07-04 NOTE — Telephone Encounter (Signed)
Done

## 2021-07-04 NOTE — Telephone Encounter (Signed)
Patient requests refill: HYDROcodone-acetaminophen (NORCO/VICODIN) 5-325 MG tablet 12 tablet       [facility] [address]na [address]

## 2021-07-11 ENCOUNTER — Telehealth: Payer: Self-pay

## 2021-07-11 MED ORDER — HYDROCODONE-ACETAMINOPHEN 5-325 MG PO TABS: ORAL_TABLET | ORAL | 0 refills | Status: DC

## 2021-07-11 NOTE — Telephone Encounter (Signed)
Hydrocodone-Acetaminophen 5/325 mg 5 day supply  Also asking if it was suppose to be 11 tablets or 12 tablets, thought maybe it was a misprint.  [facility] [place] [facility]

## 2021-07-11 NOTE — Addendum Note (Signed)
Addended by: Willette Pa on: 07/11/2021 02:52 PM   Modules accepted: Orders

## 2021-07-13 ENCOUNTER — Telehealth: Payer: Self-pay | Admitting: Orthopaedic Surgery

## 2021-07-13 NOTE — Telephone Encounter (Signed)
[facility]. She said the pharmacy called to all the other stores and they don't have them .  She doesn't know if they called just [facility] or all pharmacies in [place]  She doesn't have any pain medicine left   Please call her back at [phone]

## 2021-07-14 NOTE — Telephone Encounter (Signed)
Patient called stating that her pharmacy doesn't have her medication. She called [facility] on [address] and was told they have it. I changed her pharmacy.  Hydrocodone-Acetaminophen 5/325 mg Qty 11 tablets   PATIENT USING [facility] [address]

## 2021-07-15 MED ORDER — HYDROCODONE-ACETAMINOPHEN 5-325 MG PO TABS: ORAL_TABLET | ORAL | 0 refills | Status: DC

## 2021-07-20 ENCOUNTER — Other Ambulatory Visit: Payer: Self-pay | Admitting: Licensed Clinical Social Worker

## 2021-07-20 ENCOUNTER — Telehealth: Payer: Self-pay

## 2021-07-20 MED ORDER — HYDROCODONE-ACETAMINOPHEN 5-325 MG PO TABS: ORAL_TABLET | ORAL | 0 refills | Status: DC

## 2021-07-20 NOTE — Telephone Encounter (Signed)
Hydrocodone-Acetaminophen 5/325 mg  Qty 11 Tablets  PATIENT USES [facility] [address]

## 2021-07-20 NOTE — Patient Instructions (Signed)
Visit Information  Ms. Stagner was given information about Medicaid Managed Care team care coordination services as a part of their [place] Medicaid benefit. Alysen Y Willinger verbally consentedto engagement with the [facility] Managed Care team.   If you are experiencing a medical emergency, please call 911 or report to your local emergency department or urgent care.   If you have a non-emergency medical problem during routine business hours, please contact your provider's office and ask to speak with a nurse.   For questions related to your [employer] [facility] health plan, please call: [phone]  OR visit the member homepage at: [email or web address]  If you would like to schedule transportation through your [facility] plan, please call the following number at least 2 days in advance of your appointment: [phone]  If you are experiencing a behavioral health crisis, call the [place] at [phone]). The line is available 24 hours a day, seven days a week.  If you would like help to quit smoking, call 1-800-QUIT-NOW 516-235-8665) OR Espaol: 1-855-Djelo-Ya (8-315-176-1607) o para ms informacin haga clic aqu or Text READY to 200-400 to register via text  Ms. Hartel - following are the goals we discussed in your visit today: Connecting you with therapy  It was a pleasure speaking with you today. Follow up appointment is scheduled Feb. 16th, 2023 Patient verbalizes understanding of instructions provided today and agrees to view in [place], has received link and will get connected. Will consult at providers office tomorrow if unable to access.  Casimer Lanius, LCSW Licensed Clinical Social Worker [facility]  Managed Medicaid  Coverage [phone]    Following is a copy of your plan of care:  Care Plan : LCSW Plan of  Care  Updates made by Maurine Cane, LCSW since 07/20/2021 12:00 AM     Problem: Coping Skills for symptoms of Anxiety      Goal: Coping Skills Enhanced by connecting for ongoing therapy   Start Date: 07/20/2021  This Visit's Progress: On track  Priority: High  Note:   Current Barriers:  Disease Management support and education needs related to Anxiety with Panic Symptoms, and Stress at home  CSW Clinical Goal(s):  Patient  will work with therapist to address needs related to managing symptoms of anxiety  through collaboration with Clinical Education officer, museum, provider, and care team.   Interventions: 1:1 collaboration with primary care provider regarding development and update of comprehensive plan of care as evidenced by provider attestation and co-signature Inter-disciplinary care team collaboration (see longitudinal plan of care) Evaluation of current treatment plan related to  self management and patient's adherence to plan as established by provider Review resources, discussed options and provided patient information about  Department of Social Services ( currently receiving services ) Transportation provided by insurance provider (currently using) Discussed therapy with options based on need and insurance   Mental Health:  (Status: New goal.) Evaluation of current treatment plan related to Anxiety with Panic Symptoms, Solution-Focused Strategies employed:  Mindfulness or Relaxation training provided Active listening / Reflection utilized  Emotional Support Provided Quality of sleep assessed & Sleep Hygiene techniques promoted  Provided EMMI education information on relieving stress, and insomnia getting a good night's rest Suicidal Ideation/Homicidal Ideation assessed: denied Collaborated with [facility] in [place] [phone] Made referral to [facility]( patient has appointment 07/22/2021  Patient Self-Care Activities: Keep  appointment with [facility] Friday Feb. 10th  at 8:00 Review your EMMI educational information (Reliving Stress and getting a good nights rest) Look for an e-mail from [facility]. Practice relaxed breathing 3 times a day

## 2021-07-20 NOTE — Patient Outreach (Signed)
Medicaid Managed Care Social Work Note  07/20/2021 Name:  Judy Cross MRN:  [id number] DOB:  [birth year]  Judy Cross is an 56 y.o. year old female who is a primary patient of Lindell Spar, MD.  The Medicaid Managed Care Coordination team was consulted for assistance with:  [place] and Resources Disease Management and care coordination needs  Ms. Strothman was given information about Medicaid Managed Care Coordination team services today. Judy Cross Patient agreed to services and verbal consent obtained. Engaged with patient  for by telephone forinitial visit in response to referral for case management and/or care coordination services.   Summary: Assessed patient's previous and current treatment, coping skills, support system and barriers to care. She is currently experiencing symptoms of  anxiety which seems to be exacerbated by health concerns and psychosocial stressors..   See Care Plan below for interventions and patient self-care actives.  Recommendation: Patient may benefit from, and is in agreement for LCSW to provide brief interventions and assist with connecting her for ongoing therapy. She has initial therapy appointment Friday Feb 10th with [facility] in [place].  Assessments/Interventions:  Review of past medical history, allergies, medications, health status, including review of consultants reports, laboratory and other test data, was performed as part of comprehensive evaluation and provision of chronic care management services.  SDOH: (Social Determinant of Health) assessments and interventions performed: SDOH Interventions    Flowsheet Row Most Recent Value  SDOH Interventions   Food Insecurity Interventions Intervention Not Indicated  Financial Strain Interventions Other (Comment)  [discussed options]  Stress Interventions Provide Counseling       Advanced Directives Status:  Not addressed in this encounter.  Care Plan                  Conditions to be addressed/monitored per PCP order:  Anxiety  Care Plan : LCSW Plan of Care  Updates made by Maurine Cane, LCSW since 07/20/2021 12:00 AM     Problem: Coping Skills for symptoms of Anxiety      Goal: Coping Skills Enhanced by connecting for ongoing therapy   Start Date: 07/20/2021  This Visit's Progress: On track  Priority: High  Note:   Current Barriers:  Disease Management support and education needs related to Anxiety with Panic Symptoms, and Stress at home  CSW Clinical Goal(s):  Patient  will work with therapist to address needs related to managing symptoms of anxiety  through collaboration with Clinical Education officer, museum, provider, and care team.   Interventions: 1:1 collaboration with primary care provider regarding development and update of comprehensive plan of care as evidenced by provider attestation and co-signature Inter-disciplinary care team collaboration (see longitudinal plan of care) Evaluation of current treatment plan related to  self management and patient's adherence to plan as established by provider Review resources, discussed options and provided patient information about  Department of Social Services ( currently receiving services ) Transportation provided by insurance provider (currently using) Discussed therapy with options based on need and insurance   Mental Health:  (Status: New goal.) Evaluation of current treatment plan related to Anxiety with Panic Symptoms, Solution-Focused Strategies employed:  Mindfulness or Relaxation training provided Active listening / Reflection utilized  Emotional Support Provided Quality of sleep assessed & Sleep Hygiene techniques promoted  Provided EMMI education information on relieving stress, and insomnia getting a good night's rest Suicidal Ideation/Homicidal Ideation assessed: denied Collaborated with [facility] in [place] [phone] Made  referral to [facility]( patient has appointment 07/22/2021  Patient Self-Care Activities: Keep appointment with [facility] Friday Feb. 10th at 8:00 Review your EMMI educational information (Reliving Stress and getting a good nights rest) Look for an e-mail from [employer]. Practice relaxed breathing 3 times a day    Follow up:  Patient agrees to Care Plan and Follow-up.  Plan: The Managed Medicaid care management team will reach out to the patient again over the next 8 days.  Date/time of next scheduled Social Work care management/care coordination outreach:  Feb 16th  Casimer Lanius, LCSW Licensed Clinical Social Worker [facility]  Managed Medicaid  Coverage [phone]

## 2021-07-26 ENCOUNTER — Telehealth: Payer: Self-pay | Admitting: Radiology

## 2021-07-26 MED ORDER — HYDROCODONE-ACETAMINOPHEN 5-325 MG PO TABS: ORAL_TABLET | ORAL | 0 refills | Status: DC

## 2021-07-26 NOTE — Telephone Encounter (Signed)
Patient called, asked for refill pain meds to [facility].

## 2021-07-28 ENCOUNTER — Ambulatory Visit: Payer: Medicaid Other

## 2021-08-02 ENCOUNTER — Ambulatory Visit (INDEPENDENT_AMBULATORY_CARE_PROVIDER_SITE_OTHER): Payer: Medicaid Other | Admitting: Orthopaedic Surgery

## 2021-08-02 ENCOUNTER — Encounter: Payer: Self-pay | Admitting: Orthopaedic Surgery

## 2021-08-02 ENCOUNTER — Other Ambulatory Visit: Payer: Self-pay

## 2021-08-02 VITALS — BP 113/83 | HR 68 | Ht 61.5 in | Wt 124.0 lb

## 2021-08-02 DIAGNOSIS — F17210 Nicotine dependence, cigarettes, uncomplicated: Secondary | ICD-10-CM | POA: Diagnosis not present

## 2021-08-02 DIAGNOSIS — M25562 Pain in left knee: Secondary | ICD-10-CM | POA: Diagnosis not present

## 2021-08-02 DIAGNOSIS — G8929 Other chronic pain: Secondary | ICD-10-CM | POA: Diagnosis not present

## 2021-08-02 MED ORDER — HYDROCODONE-ACETAMINOPHEN 5-325 MG PO TABS: ORAL_TABLET | ORAL | 0 refills | Status: DC

## 2021-08-02 NOTE — Progress Notes (Signed)
My knee hurts  She has chronic pain of the left knee.  She has good and bad days.  She has no giving way, no new trauma.  She has swelling and popping.  She has been seen by neurosurgeon for pain in the back and  had a MRI done recently there.  Left knee with effusion, crepitus, ROM 0 to 110, stable, slight right limp, NV intact.  Encounter Diagnoses  Name Primary?   Chronic pain of left knee Yes   Cigarette nicotine dependence without complication    I have reviewed the [place] web site prior to prescribing narcotic medicine for this patient.  Return in six weeks.  Call if any problem.  Precautions discussed.  Electronically Signed [name], MD 2/21/20238:05 AM

## 2021-08-04 ENCOUNTER — Ambulatory Visit: Payer: Self-pay

## 2021-08-04 ENCOUNTER — Telehealth: Payer: Self-pay | Admitting: Licensed Clinical Social Worker

## 2021-08-04 NOTE — Patient Outreach (Signed)
°  Medicaid Managed Care Social Work Note  08/04/2021 Name:  Judy Cross MRN:  [id number] DOB:  [birth year]  Judy Cross is an 56 y.o. year old female who is a primary patient of Lindell Spar, MD.  The Medicaid Managed Care Coordination team was consulted for assistance with:  [place] and Resources  F/U phone call today to assess needs, progress and barriers with care plan goals.   Telephone outreach was unsuccessful. A HIPPA compliant phone message was left for the patient providing contact information and requesting a return call.   Plan:CCM LCSW will wait for return call. If no return call is received, Will route chart to Care Guide to see if patient would like to reschedule phone appointment   Review of patient status, including review of consultants reports, relevant laboratory and other test results, and collaboration with appropriate care team members and the patient's provider was performed as part of comprehensive patient evaluation and provision of care management services.     Casimer Lanius, LCSW Licensed Clinical Social Worker [facility]  Managed Medicaid  Coverage [phone]

## 2021-08-04 NOTE — Patient Instructions (Signed)
Visit Information  I am sorry you were unable to keep your phone appointment today.   The Care Guide will contact you to reschedule the phone appointment    Casimer Lanius, LCSW Licensed Clinical Social Worker /Care Management  Managed Medicaid  Coverage [phone]   If you are experiencing a medical emergency, please call 911 or report to your local emergency department or urgent care.   If you have a non-emergency medical problem during routine business hours, please contact your provider's office and ask to speak with a nurse.   For questions related to your [facility] health plan, please call: [phone]  OR visit the member homepage at: [email or web address]  If you would like to schedule transportation through your [facility] plan, please call the following number at least 2 days in advance of your appointment: [phone]  If you are experiencing a behavioral health crisis, call the [place] at [phone]). The line is available 24 hours a day, seven days a week.  If you would like help to quit smoking, call 1-800-QUIT-NOW 937-623-8292) OR Espaol: 1-855-Djelo-Ya (7-793-903-0092) o para ms informacin haga clic aqu or Text READY to 200-400 to register via text

## 2021-08-08 ENCOUNTER — Other Ambulatory Visit: Payer: Self-pay | Admitting: Orthopedic Surgery

## 2021-08-08 ENCOUNTER — Telehealth: Payer: Self-pay | Admitting: Orthopaedic Surgery

## 2021-08-08 MED ORDER — HYDROCODONE-ACETAMINOPHEN 5-325 MG PO TABS: ORAL_TABLET | ORAL | 0 refills | Status: DC

## 2021-08-08 NOTE — Progress Notes (Signed)
Meds ordered this encounter  Medications   HYDROcodone-acetaminophen (NORCO/VICODIN) 5-325 MG tablet    Sig: One tablet every six hours for pain.  Limit 5 days.    Dispense:  11 tablet    Refill:  0

## 2021-08-08 NOTE — Telephone Encounter (Signed)
Patient called for refill:  (patient aware that while Dr Luna Glasgow is out of clinic this week, that one of our other providers is reviewing refill requests: HYDROcodone-acetaminophen (NORCO/VICODIN) 5-325 MG tablet 11 tablet     [employer] [facility], [address] [place]

## 2021-08-15 ENCOUNTER — Telehealth: Payer: Self-pay | Admitting: Orthopedic Surgery

## 2021-08-15 MED ORDER — HYDROCODONE-ACETAMINOPHEN 5-325 MG PO TABS: ORAL_TABLET | ORAL | 0 refills | Status: DC

## 2021-08-15 NOTE — Telephone Encounter (Signed)
Patient called for refill: ?acetaminophen (NORCO/VICODIN) 5-325 MG tablet 11 tablet  ?             [place] ?

## 2021-08-22 ENCOUNTER — Telehealth: Payer: Self-pay | Admitting: Orthopaedic Surgery

## 2021-08-22 MED ORDER — HYDROCODONE-ACETAMINOPHEN 5-325 MG PO TABS: ORAL_TABLET | ORAL | 0 refills | Status: DC

## 2021-08-22 NOTE — Telephone Encounter (Signed)
Patient called for refill: ?acetaminophen (NORCO/VICODIN) 5-325 MG tablet 11 tablet  ?             [place] ? ?

## 2021-08-26 ENCOUNTER — Other Ambulatory Visit: Payer: Self-pay | Admitting: Licensed Clinical Social Worker

## 2021-08-26 NOTE — Patient Outreach (Addendum)
?Medicaid Managed Care ?Social Work Note ? ?08/26/2021 ?Name:  Judy Cross MRN:  [id number] DOB:  01-15-1966 ? ?Judy Cross is an 56 y.o. year old female who is a primary patient of Lindell Spar, MD.  The Medicaid Managed Care Coordination team was consulted for assistance with:  Judy Cross and Resources ? ?[name] was given information about Medicaid Managed Care Coordination team services today. Judy Cross Patient agreed to services and verbal consent obtained. ? ?Engaged with patient  for by telephone forfollow up visit in response to referral for case management and/or care coordination services.  ? ?Assessments/Interventions:  Review of past medical history, allergies, medications, health status, including review of consultants reports, laboratory and other test data, was performed as part of comprehensive evaluation and provision of chronic care management services. ? ?SDOH: (Social Determinant of Health) assessments and interventions performed: ?SDOH Interventions   ? ?Flowsheet Row Most Recent Value  ?SDOH Interventions   ?Stress Interventions Provide Counseling  ?Depression Interventions/Treatment  Counseling  ? ?  ? ? ?Advanced Directives Status:  Not addressed in this encounter. ? ?Care Plan ?                ?Allergies  ?Allergen Reactions  ? Lisinopril Swelling  ?  swelling to entire mouth.  ? Penicillins Rash  ?  Has patient had a PCN reaction causing immediate rash, facial/tongue/throat swelling, SOB or lightheadedness with hypotension: no - next day ?Has patient had a PCN reaction causing severe rash involving mucus membranes or skin necrosis: No ?Has patient had a PCN reaction that required hospitalization No ?Has patient had a PCN reaction occurring within the last 10 years: Yes ?If all of the above answers are "NO", then may proceed with Cephalosporin use. ?  ? Tramadol Other (See Comments)  ?  dizziness  ? ? ?Medications Reviewed Today   ? ? Reviewed by [name], MD (Physician) on 08/02/21 at [facility] [place] List Status: <None>  ? ?Medication Order Taking? Sig Documenting Provider Last Dose Status Informant  ?Acetaminophen (TYLENOL PO) [id number] Yes Take by mouth. [provider] Taking Active   ?albuterol (PROAIR HFA) 108 (90 Base) MCG/ACT inhaler [id number] Yes INHALE 1-2 PUFFS BY MOUTH EVERY 6 HOURS AS NEEDED FOR WHEEZE OR SHORTNESS OF BREATHMNT Lindell Spar, MD Taking Active   ?cyclobenzaprine (FLEXERIL) 10 MG tablet [id number] Yes Take 1 tablet (10 mg total) by mouth 2 (two) times daily as needed for muscle spasms. Lindell Spar, MD Taking Active   ?hydrochlorothiazide (HYDRODIURIL) 25 MG tablet [id number] Yes TAKE 1 TABLET BY MOUTH EVERY DAY Roma Schanz, CNM Taking Active   ?HYDROcodone-acetaminophen (NORCO/VICODIN) 5-325 MG tablet [id number]  One tablet every six hours for pain.  Limit 5 days. [name], MD  Active   ?ibuprofen (ADVIL) 600 MG tablet [id number] Yes Take 1 tablet (600 mg total) by mouth every 8 (eight) hours as needed for moderate pain. Lindell Spar, MD Taking Active   ?levocetirizine (XYZAL) 5 MG tablet [id number] Yes Take 1 tablet (5 mg total) by mouth every evening. Lindell Spar, MD Taking Active   ?naproxen (NAPROSYN) 500 MG tablet [id number] Yes Take 1 tablet (500 mg total) by mouth 2 (two) times daily with a meal. [name], MD Taking Active   ? ?  ?  ? ?  ? ? ?Patient Active Problem List  ? Diagnosis Date Noted  ? Spinal stenosis of lumbar region 06/17/2021  ? Mass of  lower outer quadrant of left breast 05/09/2021  ? Breast pain, left 05/09/2021  ? Arthritis of left knee 12/02/2020  ? Chronic left shoulder pain 12/02/2020  ? Allergic sinusitis 12/02/2020  ? Mass of left finger   ? Anxiety 06/12/2019  ? Seizure disorder ([place]) 06/12/2019  ? Environmental and seasonal allergies 06/12/2019  ? Abnormal Pap smear of cervix 05/27/2019  ? 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 Essential  hypertension 12/07/2016  ? Mild intermittent asthma without complication 16/03/9603  ? ? ?Conditions to be addressed/monitored per PCP order:  Anxiety and Depression ? ?Care Plan : LCSW Plan of Care  ?Updates made by Greg Cutter, LCSW since 08/26/2021 12:00 AM  ?  ? ?Problem: Coping Skills for symptoms of Anxiety   ?  ? ?Goal: Coping Skills Enhanced by connecting for ongoing therapy   ?Start Date: 07/20/2021  ?Recent Progress: On track  ?Priority: High  ?Note:   ?Current Barriers:  ?Disease Management support and education needs related to Anxiety with Panic Symptoms, and Stress at home ? ?CSW Clinical Goal(s):  ?Patient  will work with therapist to address needs related to managing symptoms of anxiety  through collaboration with Clinical Social Worker, provider, and care team.  ? ?Interventions: ?1:1 collaboration with primary care provider regarding development and update of comprehensive plan of care as evidenced by provider attestation and co-signature ?Inter-disciplinary care team collaboration (see longitudinal plan of care) ?Evaluation of current treatment plan related to  self management and patient's adherence to plan as established by provider ?Review resources, discussed options and provided patient information about  ?Department of Social Services ( currently receiving services ) ?Transportation provided by insurance provider (currently using) ?Discussed therapy with options based on need and insurance  ? ?Mental Health:  (Status: Goal on Track (progressing): YES.) ?Evaluation of current treatment plan related to Anxiety with Panic Symptoms, ?Solution-Focused Strategies employed:  ?Optician, dispensing provided ?Active listening / Reflection utilized  ?Emotional Support Provided ?Quality of sleep assessed & Sleep Hygiene techniques promoted  ?Provided EMMI education information on relieving stress, and insomnia getting a good night's rest ?Suicidal Ideation/Homicidal Ideation assessed:  denied ?Collaborated with [facility] in [place] [phone] ?Made referral to [facility]( patient has appointment 07/22/2021 . Patient was unable to attend this appointment due to a family emergency. She has not rescheduled this appointment yet but will do so today. 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 Patient reports that she has already completed her UDS, TB test and Criminal Background.  ?Patient reports that she is implementing appropriate boundaries to implement negative people, places and things. Patient works on her breathing exercises daily.  ?Patient denies any SI/HI. ?Patient reports that she had a difficult day on her father's birthday last week but was able to turn it around and even let a balloon go in remembrance of her father. Positive reinforcement provided.   ?LCSW provided education on relaxation techniques such as meditation, deep breathing, massage, grounding exercsies or yoga that can activate the body's relaxation response and ease symptoms of stress and anxiety. LCSW ask that when pt is struggling with difficult emotions and racing thoughts that they start this relaxation response process. LCSW provided extensive education on healthy coping skills for anxiety. SW used active and reflective listening, validated patient's feelings/concerns, and provided emotional support. ? ?  Patient Self-Care Activities: ?Reschedule appointment with [facility] ?Review your EMMI educational information (Reliving Stress and getting a good nights rest) Look for an e-mail from [facility]. ?Practice relaxed breathing 3 times a day ? ? ?  ?24- Hour Availability:  ?  ?[facility]  ?[place], [place] ?[place] [phone] ?Crisis [phone] ?  ?Family Service of the [employer]  [phone] ?  ?[facility] Crisis [facility]  [phone]  ?  ?[place]  [phone] (after hours) ?  ?Therapeutic Alternative/Mobile Crisis   [phone] ?  ?[place] National Suicide Hotline  1-800-

## 2021-08-26 NOTE — Patient Instructions (Addendum)
Visit Information ? ?Judy Cross was given information about Medicaid Managed Care team care coordination services as a part of their [place] Medicaid benefit. Judy Cross verbally consentedto engagement with the [facility] Managed Care team.  ? ?If you are experiencing a medical emergency, please call 911 or report to your local emergency department or urgent care.  ? ?If you have a non-emergency medical problem during routine business hours, please contact your provider's office and ask to speak with a nurse.  ? ?For questions related to your [facility] [facility] health plan, please call: [phone]  OR visit the member homepage at: [email or web address] ? ?If you would like to schedule transportation through your [place] Medicaid plan, please call the following number at least 2 days in advance of your appointment: [phone] ? ?If you are experiencing a behavioral health crisis, call the [place] at [phone]). The line is available 24 hours a day, seven days a week. ? ?If you would like help to quit smoking, call 1-800-QUIT-NOW 3861176099) OR Espa?ol: 1-855-D?jelo-Ya 272 302 3618) o para m?s informaci?n haga clic aqu? or Text READY to 200-400 to register via text. ? ?Please contact [facility] to reschedule missed appointment on 07/22/21. Continue to practice deep breathing exercises on a daily basis.  ? ?Date/time of next scheduled Social Work care management/care coordination outreach:  09/12/21 at 9:00 am ? ?Eula Fried, BSW, MSW, LCSW ?Managed Medicaid LCSW ?[facility] ?[email or web address] ?Phone: [phone] ? ? ?

## 2021-08-29 ENCOUNTER — Telehealth: Payer: Self-pay

## 2021-08-29 MED ORDER — HYDROCODONE-ACETAMINOPHEN 5-325 MG PO TABS: ORAL_TABLET | ORAL | 0 refills | Status: DC

## 2021-08-29 NOTE — Telephone Encounter (Signed)
Hydrocodone-Acetaminophen 5/325 MG  Qty 11 tablets ? ?PATIENT USES [facility] ON [address] ?

## 2021-09-05 ENCOUNTER — Telehealth: Payer: Self-pay

## 2021-09-05 MED ORDER — HYDROCODONE-ACETAMINOPHEN 5-325 MG PO TABS: ORAL_TABLET | ORAL | 0 refills | Status: DC

## 2021-09-05 NOTE — Telephone Encounter (Signed)
Hydrocodone-Acetaminophen 5/325 mg  Qty 11 Tablets ? ?PATIENT USES [facility] ON [address] ?

## 2021-09-08 ENCOUNTER — Other Ambulatory Visit: Payer: Self-pay | Admitting: Women's Health

## 2021-09-12 ENCOUNTER — Telehealth: Payer: Self-pay | Admitting: Orthopaedic Surgery

## 2021-09-12 ENCOUNTER — Other Ambulatory Visit: Payer: Self-pay

## 2021-09-12 MED ORDER — HYDROCODONE-ACETAMINOPHEN 5-325 MG PO TABS: ORAL_TABLET | ORAL | 0 refills | Status: DC

## 2021-09-12 NOTE — Telephone Encounter (Signed)
Patient called left voicemail wanting a refill on her pain medicine  ? ?HYDROcodone-acetaminophen (NORCO/VICODIN) 5-325 MG tablet ? ?[facility] on [address]  ?

## 2021-09-12 NOTE — Patient Instructions (Signed)
[place] ,  ? ?The [facility] Managed Care Team is available to provide assistance to you with your healthcare needs at no cost and as a benefit of your [facility] Health plan. I'm sorry I was unable to reach you today for our scheduled appointment. Our care guide will call you to reschedule our telephone appointment. Please call me at the number below. I am available to be of assistance to you regarding your healthcare needs. .  ? ?Thank you,  ? ?Eula Fried, BSW, MSW, LCSW ?Managed Medicaid LCSW ?[facility] ?[email or web address] ?Phone: [phone] ? ? ?

## 2021-09-12 NOTE — Patient Outreach (Signed)
[facility]) Care Management ? ?09/12/2021 ? ?Shar Y Handa ?1965/12/31 ?[id number] ? ?LCSW completed [facility] outreach attempt today during scheduled appointment time but was unable to reach patient successfully. Patient has started a new job recently and her hours may have conflicted with this scheduled appointment. A HIPPA compliant voice message was left encouraging patient to return call once available. LCSW will ask Scheduling Care Guide to reschedule [facility] SW appointment with patient as well. ? ?Eula Fried, BSW, MSW, LCSW ?Managed Medicaid LCSW ?[facility] ?[email or web address] ?Phone: [phone] ? ? ? ? ? ?

## 2021-09-13 ENCOUNTER — Telehealth: Payer: Self-pay | Admitting: Internal Medicine

## 2021-09-13 ENCOUNTER — Encounter: Payer: Self-pay | Admitting: Orthopaedic Surgery

## 2021-09-13 ENCOUNTER — Other Ambulatory Visit: Payer: Self-pay

## 2021-09-13 ENCOUNTER — Ambulatory Visit (INDEPENDENT_AMBULATORY_CARE_PROVIDER_SITE_OTHER): Payer: Medicaid Other | Admitting: Orthopaedic Surgery

## 2021-09-13 VITALS — BP 125/89 | HR 62 | Ht 61.5 in | Wt 124.6 lb

## 2021-09-13 DIAGNOSIS — G8929 Other chronic pain: Secondary | ICD-10-CM

## 2021-09-13 DIAGNOSIS — M25562 Pain in left knee: Secondary | ICD-10-CM

## 2021-09-13 NOTE — Progress Notes (Signed)
My knee hurts ? ?She has chronic pain of the left knee with swelling at times and popping.  She has no giving way.  She has no new trauma.  She has gotten a new job and is doing well with that. ? ?Left knee has slight effusion, crepitus, ROM 0 to 110, slight limp left, stable. ? ?NV intact. ? ?Encounter Diagnosis  ?Name Primary?  ? Chronic pain of left knee Yes  ? ?Continue activity. ? ?I will see in three months. ? ?Call if any problem. ? ?Precautions discussed. ? ?Electronically Signed ?[name], MD ?4/4/20238:05 AM ? ?

## 2021-09-13 NOTE — Telephone Encounter (Signed)
.. ?  Medicaid Managed Care  ? ?Unsuccessful Outreach Note ? ?09/13/2021 ?Name: Judy Cross MRN: [id number] DOB: 07-17-1965 ? ?Referred by: Lindell Spar, MD ?Reason for referral : High Risk Managed Medicaid (I called the patient to get her visit rescheduled with the MM LCSW. I left my name and number on her VM.) ? ? ?An unsuccessful telephone outreach was attempted today. The patient was referred to the case management team for assistance with care management and care coordination.  ? ?Follow Up Plan: The care management team will reach out to the patient again over the next 7 days.  ? ?Reita Chard ?Care Guide, High Risk Medicaid Managed Care ?Embedded Care Coordination ?[place]  ? ? ? ?

## 2021-09-15 ENCOUNTER — Encounter: Payer: Medicaid Other | Admitting: Internal Medicine

## 2021-09-19 ENCOUNTER — Telehealth: Payer: Self-pay

## 2021-09-19 MED ORDER — HYDROCODONE-ACETAMINOPHEN 5-325 MG PO TABS: ORAL_TABLET | ORAL | 0 refills | Status: DC

## 2021-09-19 NOTE — Telephone Encounter (Signed)
Hydrocodone-Acetaminophen 5/325 MG  Qty 11 Tablets ? ? ?PATIENT USES [facility] [address] ?

## 2021-09-21 ENCOUNTER — Telehealth: Payer: Self-pay | Admitting: Internal Medicine

## 2021-09-21 NOTE — Telephone Encounter (Signed)
.. ?  Medicaid Managed Care  ? ?Unsuccessful Outreach Note ? ?09/21/2021 ?Name: Judy Cross MRN: [id number] DOB: 10/23/1965 ? ?Referred by: Lindell Spar, MD ?Reason for referral : High Risk Managed Medicaid (I called the patient today to get her rescheduled with the MM LCSW. I left my name and number on her VM.) ? ? ?Third unsuccessful telephone outreach was attempted today. The patient was referred to the case management team for assistance with care management and care coordination. The patient's primary care provider has been notified of our unsuccessful attempts to make or maintain contact with the patient. The care management team is pleased to engage with this patient at any time in the future should he/she be interested in assistance from the care management team.  ? ?Follow Up Plan: We have been unable to make contact with the patient for follow up. The care management team is available to follow up with the patient after provider conversation with the patient regarding recommendation for care management engagement and subsequent re-referral to the care management team.  ? ?Reita Chard ?Care Guide, High Risk Medicaid Managed Care ?Embedded Care Coordination ?[place]  ? ? ?[facility]  ?

## 2021-09-23 ENCOUNTER — Encounter: Payer: Medicaid Other | Admitting: Family Medicine

## 2021-09-26 ENCOUNTER — Telehealth: Payer: Self-pay

## 2021-09-26 MED ORDER — HYDROCODONE-ACETAMINOPHEN 5-325 MG PO TABS: ORAL_TABLET | ORAL | 0 refills | Status: DC

## 2021-09-26 NOTE — Telephone Encounter (Signed)
Hydrocodone-Acetaminophen 5/325 MG  Qty 11 Tablets  PATIENT USES [facility] ON [address] 

## 2021-10-03 ENCOUNTER — Telehealth: Payer: Self-pay

## 2021-10-03 MED ORDER — HYDROCODONE-ACETAMINOPHEN 5-325 MG PO TABS: ORAL_TABLET | ORAL | 0 refills | Status: DC

## 2021-10-03 NOTE — Telephone Encounter (Signed)
Hydrocodone-Acetaminophen 5/325 MG  Qty 11 Tablets  PATIENT USES [facility] ON [address] 

## 2021-10-10 ENCOUNTER — Telehealth: Payer: Self-pay | Admitting: Orthopaedic Surgery

## 2021-10-10 NOTE — Telephone Encounter (Signed)
Patient called request refill on her pain medicine.  She has 2 medicine she wants refilled  ? ?HYDROcodone-acetaminophen (NORCO/VICODIN) 5-325 MG tablet ? ?naproxen (NAPROSYN) 500 MG tablet ? ? ?Pharmacy: [facility] [address]  ? ? ?

## 2021-10-11 MED ORDER — NAPROXEN 500 MG PO TABS: 500.0000 mg | ORAL_TABLET | Freq: Two times a day (BID) | ORAL | 5 refills | Status: DC

## 2021-10-11 MED ORDER — HYDROCODONE-ACETAMINOPHEN 5-325 MG PO TABS: ORAL_TABLET | ORAL | 0 refills | Status: DC

## 2021-10-15 ENCOUNTER — Other Ambulatory Visit: Payer: Self-pay | Admitting: Internal Medicine

## 2021-10-17 ENCOUNTER — Telehealth: Payer: Self-pay | Admitting: Orthopaedic Surgery

## 2021-10-17 MED ORDER — HYDROCODONE-ACETAMINOPHEN 5-325 MG PO TABS: ORAL_TABLET | ORAL | 0 refills | Status: DC

## 2021-10-17 NOTE — Telephone Encounter (Signed)
Patient called for refill: ?HYDROcodone-acetaminophen (NORCO/VICODIN) 5-325 MG tablet 11 tablet  ?     [facility], [place], [place] ?

## 2021-10-24 ENCOUNTER — Other Ambulatory Visit: Payer: Self-pay

## 2021-10-24 MED ORDER — HYDROCODONE-ACETAMINOPHEN 5-325 MG PO TABS: ORAL_TABLET | ORAL | 0 refills | Status: DC

## 2021-10-24 NOTE — Telephone Encounter (Signed)
Hydrocodone-Acetaminophen 5/325 MG  Qty 11 Tablets ? ?Take 1 tablet by mouth every six hours as needed for pain. ? ?Limit 5 days  ? ?PATIENT USES [facility] ON [address] ?

## 2021-10-31 ENCOUNTER — Other Ambulatory Visit: Payer: Self-pay

## 2021-10-31 MED ORDER — HYDROCODONE-ACETAMINOPHEN 5-325 MG PO TABS: ORAL_TABLET | ORAL | 0 refills | Status: DC

## 2021-10-31 NOTE — Telephone Encounter (Signed)
Hydrocodone-Acetaminophen 5/325 MG  Qty 11 Tablets  Take 1 tablet by mouth every six hours as needed for pain.   PATIENT USES [facility] ON [address]

## 2021-11-02 ENCOUNTER — Other Ambulatory Visit (HOSPITAL_COMMUNITY): Payer: Self-pay | Admitting: Adult Health

## 2021-11-02 DIAGNOSIS — N6320 Unspecified lump in the left breast, unspecified quadrant: Secondary | ICD-10-CM

## 2021-11-08 ENCOUNTER — Other Ambulatory Visit: Payer: Self-pay | Admitting: Orthopaedic Surgery

## 2021-11-08 MED ORDER — HYDROCODONE-ACETAMINOPHEN 5-325 MG PO TABS: ORAL_TABLET | ORAL | 0 refills | Status: DC

## 2021-11-08 NOTE — Telephone Encounter (Signed)
Patient requests refill -  HYDROcodone-acetaminophen (NORCO/VICODIN) 5-325 MG tablet 11 tablet  Aware while Dr Luna Glasgow is still out of clinic, requests are being routed to our other providers.   Pharmacy:  [facility] on [address], [place]

## 2021-11-09 ENCOUNTER — Ambulatory Visit (HOSPITAL_COMMUNITY)
Admission: RE | Admit: 2021-11-09 | Discharge: 2021-11-09 | Disposition: A | Discharge status: Home / Self Care | Payer: Medicaid Other | Source: Ambulatory Visit | Attending: Adult Health | Admitting: Adult Health

## 2021-11-09 DIAGNOSIS — N6320 Unspecified lump in the left breast, unspecified quadrant: Secondary | ICD-10-CM | POA: Diagnosis present

## 2021-11-09 DIAGNOSIS — N6325 Unspecified lump in the left breast, overlapping quadrants: Secondary | ICD-10-CM | POA: Diagnosis not present

## 2021-11-14 ENCOUNTER — Telehealth: Payer: Self-pay

## 2021-11-14 MED ORDER — HYDROCODONE-ACETAMINOPHEN 5-325 MG PO TABS: ORAL_TABLET | ORAL | 0 refills | Status: DC

## 2021-11-14 NOTE — Telephone Encounter (Signed)
Hydrocodone-Acetaminophen 5/325 MG  Qty 11 Tablets  PATIENT USES [facility] ON [address] 

## 2021-11-17 ENCOUNTER — Other Ambulatory Visit: Payer: Self-pay

## 2021-11-17 ENCOUNTER — Encounter (HOSPITAL_COMMUNITY): Payer: Self-pay | Admitting: Emergency Medicine

## 2021-11-17 ENCOUNTER — Telehealth: Payer: Self-pay | Admitting: Internal Medicine

## 2021-11-17 ENCOUNTER — Emergency Department (HOSPITAL_COMMUNITY)
Admission: EM | Admit: 2021-11-17 | Discharge: 2021-11-17 | Disposition: A | Discharge status: Home / Self Care | Payer: Medicaid Other | Attending: Emergency Medicine | Admitting: Emergency Medicine

## 2021-11-17 ENCOUNTER — Emergency Department (HOSPITAL_COMMUNITY): Payer: Medicaid Other

## 2021-11-17 DIAGNOSIS — E876 Hypokalemia: Secondary | ICD-10-CM | POA: Insufficient documentation

## 2021-11-17 DIAGNOSIS — R0789 Other chest pain: Secondary | ICD-10-CM | POA: Diagnosis present

## 2021-11-17 DIAGNOSIS — R079 Chest pain, unspecified: Secondary | ICD-10-CM

## 2021-11-17 LAB — BASIC METABOLIC PANEL
Anion gap: 11 (ref 5–15)
BUN: 9 mg/dL (ref 6–20)
CO2: 26 mmol/L (ref 22–32)
Calcium: 9.5 mg/dL (ref 8.9–10.3)
Chloride: 105 mmol/L (ref 98–111)
Creatinine, Ser: 0.83 mg/dL (ref 0.44–1.00)
GFR, Estimated: >60 mL/min (ref >60)
Glucose, Bld: 79 mg/dL (ref 70–99)
Potassium: 3.4 mmol/L — ABNORMAL LOW (ref 3.5–5.1)
Sodium: 142 mmol/L (ref 135–145)

## 2021-11-17 LAB — CBC
HCT: 45 % (ref 36.0–46.0)
Hemoglobin: 14.9 g/dL (ref 12.0–15.0)
MCH: 32.3 pg (ref 26.0–34.0)
MCHC: 33.1 g/dL (ref 30.0–36.0)
MCV: 97.4 fL (ref 80.0–100.0)
Platelets: 284 10*3/uL (ref 150–400)
RBC: 4.62 MIL/uL (ref 3.87–5.11)
RDW: 13.6 % (ref 11.5–15.5)
WBC: 6.2 10*3/uL (ref 4.0–10.5)
nRBC: 0 % (ref 0.0–0.2)

## 2021-11-17 LAB — TROPONIN I (HIGH SENSITIVITY)
Troponin I (High Sensitivity): <2 ng/L (ref <18)
Troponin I (High Sensitivity): <2 ng/L (ref <18)

## 2021-11-17 MED ORDER — OMEPRAZOLE 20 MG PO CPDR: 20.0000 mg | DELAYED_RELEASE_CAPSULE | Freq: Every day | ORAL | 0 refills | Status: DC

## 2021-11-17 NOTE — Telephone Encounter (Signed)
ER REFERRAL  

## 2021-11-17 NOTE — ED Triage Notes (Signed)
Pt having mid-sternal chest pain rate 7/10,that started this am while getting ready for work.

## 2021-11-17 NOTE — ED Notes (Signed)
Pt provided discharge instructions and prescription information. Pt was given the opportunity to ask questions and questions were answered.   

## 2021-11-17 NOTE — Discharge Instructions (Signed)
With a food feeling that he gets stuck in her throat you probably need to follow-up with gastroenterology.  The information has been given.

## 2021-11-17 NOTE — ED Provider Notes (Signed)
[facility] Provider Note   CSN: [id number] Arrival date & time: 11/17/21  9470     History  Chief Complaint  Patient presents with   Chest Pain    Judy Cross is a 56 y.o. female.   Chest Pain Patient was presents with chest pain.  Anterior chest.  Began this morning while she was getting ready for work.  Has been able to ambulate and do physical activity including a large amount yesterday without any chest pain.For the last week that was felt as if she would get food caught in her chest.  States she has a history of reflux.  Had been on some antibiotics and states the pain got worse although the reflux feels if it had cleared up.  No nausea or vomiting.  No known cardiac history.     Home Medications Prior to Admission medications   Medication Sig Start Date End Date Taking? Authorizing Provider  albuterol (PROAIR HFA) 108 (90 Base) MCG/ACT inhaler INHALE 1-2 PUFFS BY MOUTH EVERY 6 HOURS AS NEEDED FOR WHEEZE OR SHORTNESS OF BREATHMNT 01/20/21  Yes Lindell Spar, MD  hydrochlorothiazide (HYDRODIURIL) 25 MG tablet TAKE 1 TABLET BY MOUTH EVERY DAY 10/17/21  Yes Lindell Spar, MD  naproxen (NAPROSYN) 500 MG tablet Take 1 tablet (500 mg total) by mouth 2 (two) times daily with a meal. 10/11/21  Yes [name], MD  omeprazole (PRILOSEC) 20 MG capsule Take 1 capsule (20 mg total) by mouth daily. 11/17/21  Yes [name], MD  cyclobenzaprine (FLEXERIL) 10 MG tablet Take 1 tablet (10 mg total) by mouth 2 (two) times daily as needed for muscle spasms. Patient not taking: Reported on 11/17/2021 12/02/20   Lindell Spar, MD  HYDROcodone-acetaminophen (NORCO/VICODIN) 5-325 MG tablet One tablet every six hours for pain.  Limit 5 days. Patient not taking: Reported on 11/17/2021 11/14/21   [name], MD  ibuprofen (ADVIL) 600 MG tablet Take 1 tablet (600 mg total) by mouth every 8 (eight) hours as needed for moderate pain. Patient not taking: Reported on 11/17/2021  12/02/20   Lindell Spar, MD  levocetirizine (XYZAL) 5 MG tablet Take 1 tablet (5 mg total) by mouth every evening. Patient not taking: Reported on 11/17/2021 12/02/20   Lindell Spar, MD      Allergies    Lisinopril, Penicillins, and Tramadol    Review of Systems   Review of Systems  Cardiovascular:  Positive for chest pain.    Physical Exam Updated Vital Signs BP (!) 144/94   Pulse 60   Temp 97.9 F (36.6 C) (Oral)   Resp 20   Ht 5' 1.5" (1.562 m)   Wt 56.7 kg   SpO2 100%   BMI 23.24 kg/m  Physical Exam Vitals and nursing note reviewed.  Eyes:     Pupils: Pupils are equal, round, and reactive to light.  Cardiovascular:     Rate and Rhythm: Regular rhythm.  Pulmonary:     Breath sounds: No decreased breath sounds or wheezing.  Chest:     Chest wall: No tenderness.     Comments: Mild anterior chest tenderness without rebound or guarding.  No hernia palpated. Abdominal:     Tenderness: There is no abdominal tenderness.  Musculoskeletal:     Right lower leg: No edema.     Left lower leg: No edema.  Skin:    General: Skin is warm.  Neurological:     Mental Status: She is alert.  ED Results / Procedures / Treatments   Labs (all labs ordered are listed, but only abnormal results are displayed) Labs Reviewed  BASIC METABOLIC PANEL - Abnormal; Notable for the following components:      Result Value   Potassium 3.4 (*)    All other components within normal limits  CBC  TROPONIN I (HIGH SENSITIVITY)  TROPONIN I (HIGH SENSITIVITY)    EKG EKG Interpretation  Date/Time:  Thursday November 17 2021 09:55:20 EDT Ventricular Rate:  67 PR Interval:  182 QRS Duration: 88 QT Interval:  408 QTC Calculation: 431 R Axis:   65 Text Interpretation: Sinus rhythm Low voltage, precordial leads Borderline ST elevation, inferior leads No significant change since last tracing Confirmed by [name] [phone]) on 11/17/2021 10:10:28 AM  Radiology DG Chest Portable 1  View  Result Date: 11/17/2021 CLINICAL DATA:  Chest pain EXAM: PORTABLE CHEST 1 VIEW COMPARISON:  06/07/2019 FINDINGS: The heart size and mediastinal contours are within normal limits. Both lungs are clear. The visualized skeletal structures are unremarkable. IMPRESSION: No active disease. Electronically Signed   By: Jerilynn Mages.  Shick M.D.   On: 11/17/2021 10:27    Procedures Procedures    Medications Ordered in ED Medications - No data to display  ED Course/ Medical Decision Making/ A&P                           Medical Decision Making Amount and/or Complexity of Data Reviewed Labs: ordered. Radiology: ordered.  Risk Prescription drug management.   Patient with chest pain.  Anterior chest.  Feels as if food gets stuck in her esophagus.  EKG reassuring.  Troponin negative x2 since pain started earlier today.  Not exertional.  Doubt cardiac ischemia as a cause.  There is worry of esophageal pathology since the feeling of food getting stuck.  Appears stable for discharge home and GI follow-up.  Will start on a proton pump inhibitor.  Discharge home        Final Clinical Impression(s) / ED Diagnoses Final diagnoses:  Nonspecific chest pain    Rx / DC Orders ED Discharge Orders          Ordered    omeprazole (PRILOSEC) 20 MG capsule  Daily        11/17/21 1302              [name], MD 11/17/21 [phone]

## 2021-11-21 ENCOUNTER — Telehealth: Payer: Self-pay

## 2021-11-21 NOTE — Telephone Encounter (Signed)
Hydrocodone-Acetaminophen 5/325 MG  Qty 11 Tablets  PATIENT USES [facility] ON [address] 

## 2021-11-22 MED ORDER — HYDROCODONE-ACETAMINOPHEN 5-325 MG PO TABS: ORAL_TABLET | ORAL | 0 refills | Status: DC

## 2021-11-28 ENCOUNTER — Telehealth: Payer: Self-pay

## 2021-11-28 NOTE — Telephone Encounter (Signed)
Hydrocodone-Acetaminophen 5/325 MG  Qty 11 Tablets  PATIENT USES [facility] ON [address] 

## 2021-11-29 MED ORDER — HYDROCODONE-ACETAMINOPHEN 5-325 MG PO TABS: ORAL_TABLET | ORAL | 0 refills | Status: DC

## 2021-12-02 ENCOUNTER — Ambulatory Visit: Payer: Medicaid Other | Admitting: Adult Health

## 2021-12-05 ENCOUNTER — Telehealth: Payer: Self-pay

## 2021-12-05 ENCOUNTER — Telehealth: Payer: Self-pay | Admitting: Internal Medicine

## 2021-12-05 ENCOUNTER — Other Ambulatory Visit: Payer: Self-pay | Admitting: Radiology

## 2021-12-05 NOTE — Telephone Encounter (Signed)
Patient called for a refill of her pain medication. I told her that we don't have any doctors in the office this week. I told to her to contact her PCP and ask if he would do a one time refill since we don't have any doctors in the office. She stated she would.

## 2021-12-11 ENCOUNTER — Encounter: Payer: Self-pay | Admitting: Gastroenterology

## 2021-12-11 NOTE — Progress Notes (Deleted)
Referring Provider: [name], MD [facility], ER physician) Primary Care Physician:  Lindell Spar, MD Primary Gastroenterologist:  Dr. Abbey Chatters  No chief complaint on file.   HPI:   Judy Cross is a 56 y.o. female presenting today at the request of [facility] emergency room physician, [name], MD, for chest pain when swallowing.   Patient was evaluated in [facility] ED on 11/17/2021 for chest pain that started that morning while getting ready for work.  She had no chest pain today before including during large amounts of physical activity.  She noted for the last week, she felt food getting caught in her chest and also had history of reflux.  Reported she had been on antibiotics and stated pain got worse the reflux had cleared up.  No significant laboratory or EKG abnormalities.  Recommended GI follow-up and starting omeprazole 20 mg daily.  Today:    Past Medical History:  Diagnosis Date   Allergy    Anxiety 06/12/2019   Arthritis    Asthma in adult, mild intermittent, uncomplicated 9/48/5462   Breast nodule 10/12/2015   Breast nodule 10/12/2015   Bulge of lumbar disc without myelopathy    Endometrial polyp 04/28/2013   Enlarged uterus 03/26/2013   Fibroids 04/28/2013   Hot flashes 03/26/2013   Hot flashes 03/26/2013   Hypertension    Irregular bleeding 03/26/2013   Neuromuscular disorder ([place])    sciatica   Seasonal allergies    Seizures ([place])    last seizure was 2 years ago; unknown etiology. On Keppra.   Trichomoniasis 10/20/2020   tx 10/20/20  POC___    Past Surgical History:  Procedure Laterality Date   ABLATION     with polyp removal.   BREAST BIOPSY Left    neg   CESAREAN SECTION     x 3   COLONOSCOPY WITH PROPOFOL N/A 01/11/2016   Procedure: COLONOSCOPY WITH PROPOFOL;  Surgeon: Danie Binder, MD;  Location: [facility];  Service: Endoscopy;  Laterality: N/A;  800   EXAMINATION UNDER ANESTHESIA  02/28/2012   Procedure: EXAM UNDER  ANESTHESIA;  Surgeon: Donato Heinz, MD;  Location: [facility];  Service: General;  Laterality: N/A;   EXCISION MASS UPPER EXTREMETIES Left 11/13/2019   Procedure: EXCISION MASS UPPER EXTREMETIES ring finger left;  Surgeon: Carole Civil, MD;  Location: [facility];  Service: Orthopedics;  Laterality: Left;   GANGLION CYST EXCISION Left    HYSTEROSCOPY WITH D & C N/A 05/23/2013   Procedure: DILATATION AND CURETTAGE /HYSTEROSCOPY;  Surgeon: Jonnie Kind, MD;  Location: [facility];  Service: Gynecology;  Laterality: N/A;   POLYPECTOMY N/A 05/23/2013   Procedure: ENDOMETRIAL POLYP REMOVAL;  Surgeon: Jonnie Kind, MD;  Location: [facility];  Service: Gynecology;  Laterality: N/A;   SPHINCTEROTOMY  02/28/2012   Procedure: SPHINCTEROTOMY;  Surgeon: Donato Heinz, MD;  Location: [facility];  Service: General;  Laterality: N/A;  Lateral Internal Sphincterotomy   TRIGGER FINGER RELEASE Left    ring finger   TUBAL LIGATION      Current Outpatient Medications  Medication Sig Dispense Refill   albuterol (PROAIR HFA) 108 (90 Base) MCG/ACT inhaler INHALE 1-2 PUFFS BY MOUTH EVERY 6 HOURS AS NEEDED FOR WHEEZE OR SHORTNESS OF BREATHMNT 8.5 each 0   cyclobenzaprine (FLEXERIL) 10 MG tablet Take 1 tablet (10 mg total) by mouth 2 (two) times daily as needed for muscle spasms. (Patient not taking: Reported on 11/17/2021) 20 tablet 0  hydrochlorothiazide (HYDRODIURIL) 25 MG tablet TAKE 1 TABLET BY MOUTH EVERY DAY 90 tablet 0   HYDROcodone-acetaminophen (NORCO/VICODIN) 5-325 MG tablet One tablet every six hours for pain.  Limit 5 days. 11 tablet 0   ibuprofen (ADVIL) 600 MG tablet Take 1 tablet (600 mg total) by mouth every 8 (eight) hours as needed for moderate pain. (Patient not taking: Reported on 11/17/2021) 30 tablet 0   levocetirizine (XYZAL) 5 MG tablet Take 1 tablet (5 mg total) by mouth every evening. (Patient not taking: Reported on 11/17/2021) 30 tablet 5   naproxen (NAPROSYN) 500 MG tablet Take 1 tablet (500 mg total)  by mouth 2 (two) times daily with a meal. 60 tablet 5   omeprazole (PRILOSEC) 20 MG capsule Take 1 capsule (20 mg total) by mouth daily. 14 capsule 0   No current facility-administered medications for this visit.    Allergies as of 12/12/2021 - Review Complete 11/17/2021  Allergen Reaction Noted   Lisinopril Swelling 02/02/2015   Penicillins Rash 01/19/2011   Tramadol Other (See Comments) 03/02/2015    Family History  Problem Relation Age of Onset   Hypertension Mother    Arthritis Mother    COPD Mother    Diabetes Mother    Heart disease Mother    Hypertension Father    Parkinson's disease Father    Colon cancer Neg Hx     Social History   Socioeconomic History   Marital status: Single    Spouse name: Not on file   Number of children: 3   Years of education: 14   Highest education level: Not on file  Occupational History   Occupation: home health aid  Tobacco Use   Smoking status: Some Days    Packs/day: 0.25    Years: 20.00    Total pack years: 5.00    Types: Cigarettes   Smokeless tobacco: Never   Tobacco comments:    smokes 1 cig daily  Vaping Use   Vaping Use: Never used  Substance and Sexual Activity   Alcohol use: Yes    Comment: occasional   Drug use: No   Sexual activity: Yes    Birth control/protection: Surgical    Comment: tubal and ablation  Other Topics Concern   Not on file  Social History Narrative   Degree in child care   Currently is a home health aid   Lives at home with youngest daughter Jeanella Craze senior this year    Son 24-Jalon  in college for sociology    Daughter [place]      Enjoys: write, reading, cooking, drawing      Diet: eats all food groups   Caffeine: pepsi weekly    Water: 6-8 cups    Juice some times      Wears seat belt    Smoke detectors    Does not use phone with driving.   Social Determinants of Health   Financial Resource Strain: High Risk (07/20/2021)   Overall Financial  Resource Strain (CARDIA)    Difficulty of Paying Living Expenses: Hard  Food Insecurity: No Food Insecurity (07/20/2021)   Hunger Vital Sign    Worried About Running Out of Food in the Last Year: Never true    Ran Out of Food in the Last Year: Never true  Transportation Needs: No Transportation Needs (07/20/2021)   PRAPARE - Hydrologist (Medical): No    Lack of Transportation (Non-Medical): No  Physical Activity:  Sufficiently Active (10/12/2020)   Exercise Vital Sign    Days of Exercise per Week: 5 days    Minutes of Exercise per Session: 40 min  Stress: Stress Concern Present (08/26/2021)   [place]    Feeling of Stress : Very much  Social Connections: Moderately Isolated (10/12/2020)   Social Connection and Isolation Panel [NHANES]    Frequency of Communication with Friends and Family: Three times a week    Frequency of Social Gatherings with Friends and Family: Once a week    Attends Religious Services: More than 4 times per year    Active Member of [employer] or Organizations: No    Attends Archivist Meetings: Never    Marital Status: Never married  Intimate Partner Violence: Not At Risk (10/12/2020)   Humiliation, Afraid, Rape, and Kick questionnaire    Fear of Current or Ex-Partner: No    Emotionally Abused: No    Physically Abused: No    Sexually Abused: No    Review of Systems: Gen: Denies any fever, chills, cold or flulike symptoms, presyncope, syncope. CV: Denies chest pain, heart palpitations. Resp: Denies shortness of breath, cough GI: See HPI GU : Denies urinary burning, urinary frequency, urinary hesitancy MS: Denies joint pain. Derm: Denies rash. Psych: Denies depression, anxiety. Heme: See HPI  Physical Exam: There were no vitals taken for this visit. General:   Alert and oriented. Pleasant and cooperative. Well-nourished and well-developed.  Head:  Normocephalic  and atraumatic. Eyes:  Without icterus, sclera clear and conjunctiva pink.  Ears:  Normal auditory acuity. Lungs:  Clear to auscultation bilaterally. No wheezes, rales, or rhonchi. No distress.  Heart:  S1, S2 present without murmurs appreciated.  Abdomen:  +BS, soft, non-tender and non-distended. No HSM noted. No guarding or rebound. No masses appreciated.  Rectal:  Deferred  Msk:  Symmetrical without gross deformities. Normal posture. Extremities:  Without edema. Neurologic:  Alert and  oriented x4;  grossly normal neurologically. Skin:  Intact without significant lesions or rashes. Psych:  Normal mood and affect.    Assessment:     Plan:  ***   Aliene Altes, PA-C [facility] 12/12/2021

## 2021-12-12 ENCOUNTER — Other Ambulatory Visit: Payer: Self-pay | Admitting: Orthopaedic Surgery

## 2021-12-12 ENCOUNTER — Ambulatory Visit: Payer: Medicaid Other | Admitting: Gastroenterology

## 2021-12-12 ENCOUNTER — Encounter: Payer: Self-pay | Admitting: Internal Medicine

## 2021-12-12 NOTE — Telephone Encounter (Signed)
Patient called back to relay she has not yet received her refill per call on 12/05/21:  HYDROcodone-acetaminophen (NORCO/VICODIN) 5-325 MG tablet 11 tablet       [facility], [address], [place]

## 2021-12-14 MED ORDER — HYDROCODONE-ACETAMINOPHEN 5-325 MG PO TABS: ORAL_TABLET | ORAL | 0 refills | Status: DC

## 2021-12-19 ENCOUNTER — Telehealth: Payer: Self-pay | Admitting: Orthopaedic Surgery

## 2021-12-19 NOTE — Telephone Encounter (Signed)
UPDATE/CORRECTION: HYDROcodone-acetaminophen (NORCO/VICODIN) 5-325 MG tablet  [facility], [place], [place]   - patient aware of appointment with Dr Luna Glasgow tomorrow, 12/20/21

## 2021-12-19 NOTE — Telephone Encounter (Addendum)
UPDATE/Correction

## 2021-12-20 ENCOUNTER — Encounter: Payer: Self-pay | Admitting: Orthopaedic Surgery

## 2021-12-20 ENCOUNTER — Ambulatory Visit (INDEPENDENT_AMBULATORY_CARE_PROVIDER_SITE_OTHER): Payer: Medicaid Other | Admitting: Orthopaedic Surgery

## 2021-12-20 DIAGNOSIS — M25562 Pain in left knee: Secondary | ICD-10-CM

## 2021-12-20 DIAGNOSIS — G8929 Other chronic pain: Secondary | ICD-10-CM

## 2021-12-20 DIAGNOSIS — F17210 Nicotine dependence, cigarettes, uncomplicated: Secondary | ICD-10-CM

## 2021-12-20 MED ORDER — HYDROCODONE-ACETAMINOPHEN 5-325 MG PO TABS: ORAL_TABLET | ORAL | 0 refills | Status: DC

## 2021-12-20 NOTE — Progress Notes (Signed)
My left knee is hurting more.  PROCEDURE NOTE:  The patient requests injections of the left knee , verbal consent was obtained.  The left knee was prepped appropriately after time out was performed.   Sterile technique was observed and injection of 1 cc of DepoMedrol 40 mg with several cc's of plain xylocaine. Anesthesia was provided by ethyl chloride and a 20-gauge needle was used to inject the knee area. The injection was tolerated well.  A band aid dressing was applied.  The patient was advised to apply ice later today and tomorrow to the injection sight as needed.  Encounter Diagnoses  Name Primary?   Chronic pain of left knee Yes   Cigarette nicotine dependence without complication    Return in six weeks.  I have reviewed the [place] web site prior to prescribing narcotic medicine for this patient.  Call if any problem.  Precautions discussed.  Electronically Signed [name], MD 7/11/20238:07 AM

## 2021-12-26 ENCOUNTER — Telehealth: Payer: Self-pay

## 2021-12-26 MED ORDER — HYDROCODONE-ACETAMINOPHEN 5-325 MG PO TABS: ORAL_TABLET | ORAL | 0 refills | Status: DC

## 2021-12-27 ENCOUNTER — Other Ambulatory Visit: Payer: Self-pay

## 2021-12-27 DIAGNOSIS — G8929 Other chronic pain: Secondary | ICD-10-CM

## 2021-12-27 NOTE — Telephone Encounter (Signed)
Referral placed.

## 2022-01-03 ENCOUNTER — Encounter: Payer: Self-pay | Admitting: Orthopaedic Surgery

## 2022-01-03 ENCOUNTER — Ambulatory Visit (INDEPENDENT_AMBULATORY_CARE_PROVIDER_SITE_OTHER): Payer: Medicaid Other | Admitting: Orthopaedic Surgery

## 2022-01-03 VITALS — BP 182/104 | HR 72 | Ht 61.5 in | Wt 125.0 lb

## 2022-01-03 DIAGNOSIS — F17210 Nicotine dependence, cigarettes, uncomplicated: Secondary | ICD-10-CM

## 2022-01-03 DIAGNOSIS — M25562 Pain in left knee: Secondary | ICD-10-CM | POA: Diagnosis not present

## 2022-01-03 DIAGNOSIS — G8929 Other chronic pain: Secondary | ICD-10-CM

## 2022-01-03 MED ORDER — HYDROCODONE-ACETAMINOPHEN 5-325 MG PO TABS: ORAL_TABLET | ORAL | 0 refills | Status: DC

## 2022-01-03 NOTE — Patient Instructions (Signed)
Steps to Quit Smoking Smoking tobacco is the leading cause of preventable death. It can affect almost every organ in the body. Smoking puts you and people around you at risk for many serious, long-lasting (chronic) diseases. Quitting smoking can be hard, but it is one of the best things that you can do for your health. It is never too late to quit. Do not give up if you cannot quit the first time. Some people need to try many times to quit. Do your best to stick to your quit plan, and talk with your doctor if you have any questions or concerns. How do I get ready to quit? Pick a date to quit. Set a date within the next 2 weeks to give you time to prepare. Write down the reasons why you are quitting. Keep this list in places where you will see it often. Tell your family, friends, and co-workers that you are quitting. Their support is important. Talk with your doctor about the choices that may help you quit. Find out if your health insurance will pay for these treatments. Know the people, places, things, and activities that make you want to smoke (triggers). Avoid them. What first steps can I take to quit smoking? Throw away all cigarettes at home, at work, and in your car. Throw away the things that you use when you smoke, such as ashtrays and lighters. Clean your car. Empty the ashtray. Clean your home, including curtains and carpets. What can I do to help me quit smoking? Talk with your doctor about taking medicines and seeing a counselor. You are more likely to succeed when you do both. If you are pregnant or breastfeeding: Talk with your doctor about counseling or other ways to quit smoking. Do not take medicine to help you quit smoking unless your doctor tells you to. Quit right away Quit smoking completely, instead of slowly cutting back on how much you smoke over a period of time. Stopping smoking right away may be more successful than slowly quitting. Go to counseling. In-person is best  if this is an option. You are more likely to quit if you go to counseling sessions regularly. Take medicine You may take medicines to help you quit. Some medicines need a prescription, and some you can buy over-the-counter. Some medicines may contain a drug called nicotine to replace the nicotine in cigarettes. Medicines may: Help you stop having the desire to smoke (cravings). Help to stop the problems that come when you stop smoking (withdrawal symptoms). Your doctor may ask you to use: Nicotine patches, gum, or lozenges. Nicotine inhalers or sprays. Non-nicotine medicine that you take by mouth. Find resources Find resources and other ways to help you quit smoking and remain smoke-free after you quit. They include: Online chats with a counselor. Phone quitlines. Printed self-help materials. Support groups or group counseling. Text messaging programs. Mobile phone apps. Use apps on your mobile phone or tablet that can help you stick to your quit plan. Examples of free services include Quit Guide from the CDC and smokefree.gov  What can I do to make it easier to quit?  Talk to your family and friends. Ask them to support and encourage you. Call a phone quitline, such as 1-800-QUIT-NOW, reach out to support groups, or work with a counselor. Ask people who smoke to not smoke around you. Avoid places that make you want to smoke, such as: Bars. Parties. Smoke-break areas at work. Spend time with people who do not smoke. Lower   the stress in your life. Stress can make you want to smoke. Try these things to lower stress: Getting regular exercise. Doing deep-breathing exercises. Doing yoga. Meditating. What benefits will I see if I quit smoking? Over time, you may have: A better sense of smell and taste. Less coughing and sore throat. A slower heart rate. Lower blood pressure. Clearer skin. Better breathing. Fewer sick days. Summary Quitting smoking can be hard, but it is one of  the best things that you can do for your health. Do not give up if you cannot quit the first time. Some people need to try many times to quit. When you decide to quit smoking, make a plan to help you succeed. Quit smoking right away, not slowly over a period of time. When you start quitting, get help and support to keep you smoke-free. This information is not intended to replace advice given to you by your health care provider. Make sure you discuss any questions you have with your health care provider. Document Revised: 05/20/2021 Document Reviewed: 05/20/2021 Elsevier Patient Education  2023 Elsevier Inc.  

## 2022-01-03 NOTE — Progress Notes (Signed)
PROCEDURE NOTE:  The patient requests injections of the left knee , verbal consent was obtained.  The left knee was prepped appropriately after time out was performed.   Sterile technique was observed and injection of 1 cc of DepoMedrol 40 mg with several cc's of plain xylocaine. Anesthesia was provided by ethyl chloride and a 20-gauge needle was used to inject the knee area. The injection was tolerated well.  A band aid dressing was applied.  The patient was advised to apply ice later today and tomorrow to the injection sight as needed.  Encounter Diagnoses  Name Primary?   Chronic pain of left knee Yes   Cigarette nicotine dependence without complication    I have reviewed the [place] web site prior to prescribing narcotic medicine for this patient.  Return in six weeks.  Call if any problem.  Precautions discussed.  Electronically Signed [name], MD 7/25/20238:47 AM

## 2022-01-06 ENCOUNTER — Encounter: Payer: Self-pay | Admitting: Internal Medicine

## 2022-01-06 ENCOUNTER — Ambulatory Visit (INDEPENDENT_AMBULATORY_CARE_PROVIDER_SITE_OTHER): Payer: Medicaid Other | Admitting: Internal Medicine

## 2022-01-06 VITALS — BP 122/86 | HR 72 | Resp 18 | Ht 61.5 in | Wt 128.6 lb

## 2022-01-06 DIAGNOSIS — E559 Vitamin D deficiency, unspecified: Secondary | ICD-10-CM

## 2022-01-06 DIAGNOSIS — J4520 Mild intermittent asthma, uncomplicated: Secondary | ICD-10-CM | POA: Diagnosis not present

## 2022-01-06 DIAGNOSIS — I10 Essential (primary) hypertension: Secondary | ICD-10-CM | POA: Diagnosis not present

## 2022-01-06 DIAGNOSIS — G40909 Epilepsy, unspecified, not intractable, without status epilepticus: Secondary | ICD-10-CM | POA: Diagnosis not present

## 2022-01-06 DIAGNOSIS — R7303 Prediabetes: Secondary | ICD-10-CM

## 2022-01-06 DIAGNOSIS — Z1159 Encounter for screening for other viral diseases: Secondary | ICD-10-CM

## 2022-01-06 DIAGNOSIS — M1712 Unilateral primary osteoarthritis, left knee: Secondary | ICD-10-CM

## 2022-01-06 DIAGNOSIS — F17210 Nicotine dependence, cigarettes, uncomplicated: Secondary | ICD-10-CM

## 2022-01-06 DIAGNOSIS — E782 Mixed hyperlipidemia: Secondary | ICD-10-CM

## 2022-01-06 DIAGNOSIS — Z720 Tobacco use: Secondary | ICD-10-CM | POA: Insufficient documentation

## 2022-01-06 DIAGNOSIS — B351 Tinea unguium: Secondary | ICD-10-CM

## 2022-01-06 MED ORDER — LEVETIRACETAM 500 MG PO TABS: 500.0000 mg | ORAL_TABLET | Freq: Two times a day (BID) | ORAL | 2 refills | Status: DC

## 2022-01-06 MED ORDER — HYDROCHLOROTHIAZIDE 25 MG PO TABS
25.0000 mg | ORAL_TABLET | Freq: Every day | ORAL | 1 refills | Status: DC
Start: 2022-01-06 — End: 2022-06-13

## 2022-01-06 NOTE — Assessment & Plan Note (Signed)
Well-controlled with Albuterol PRN

## 2022-01-06 NOTE — Patient Instructions (Addendum)
Please start taking Keppra as prescribed. You are being referred to Neurology for seizures.  You are being referred to Podiatry for toenail infection.

## 2022-01-06 NOTE — Assessment & Plan Note (Signed)
BP Readings from Last 1 Encounters:  01/06/22 122/86   Well-controlled Counseled for compliance with the medications Advised DASH diet and moderate exercise/walking, at least 150 mins/week

## 2022-01-06 NOTE — Progress Notes (Signed)
Established Patient Office Visit  Subjective:  Patient ID: Judy Cross, female    DOB: May 04, 1966  Age: 56 y.o. MRN: [id number]  CC:  Chief Complaint  Patient presents with   Nail Problem    Patient left big toe is black has had this removed before also needs someone to trim toes has been like this since 12-07-21    HPI Judy Cross is a 56 y.o. female with past medical history of HTN, asthma, seizure disorder, lumbar spinal stenosis, tobacco abuse and OA of knee who presents for f/u of her chronic medical conditions.  She complains of thick toenails bilaterally.  She also reports blackish discoloration of great toe of left foot.  Denies any recent injury.  Denies any numbness or tingling of the toes.  She has had nail trimming by podiatrist in the past.  Seizure disorder: She reports 3 episodes of passing out in this year.  Last episode about 1 and half month ago, where she noticed frothing from her mouth after waking up.  These episodes were unwitnessed.  She used to take Keppra for seizures, but was later taken off of it as she was seizure-free for many years, as reported by her.  She has history of chronic low back pain and left knee pain.  She has received steroid injections and follows up with Dr. Luna Glasgow.  She takes Norco as needed for severe pain.   Past Medical History:  Diagnosis Date   Allergy    Anxiety 06/12/2019   Arthritis    Asthma in adult, mild intermittent, uncomplicated 0/03/2724   Breast nodule 10/12/2015   Breast nodule 10/12/2015   Bulge of lumbar disc without myelopathy    Endometrial polyp 04/28/2013   Enlarged uterus 03/26/2013   Fibroids 04/28/2013   Hot flashes 03/26/2013   Hot flashes 03/26/2013   Hypertension    Irregular bleeding 03/26/2013   Neuromuscular disorder ([place])    sciatica   Seasonal allergies    Seizures ([place])    last seizure was 2 years ago; unknown etiology. On Keppra.   Trichomoniasis 10/20/2020   tx 10/20/20  POC___     Past Surgical History:  Procedure Laterality Date   ABLATION     with polyp removal.   BREAST BIOPSY Left    neg   CESAREAN SECTION     x 3   COLONOSCOPY WITH PROPOFOL N/A 01/11/2016   Surgeon: Danie Binder, MD; mild diverticulosis in proximal ascending colon, nonbleeding internal hemorrhoids.  Repeat in 10 years.   EXAMINATION UNDER ANESTHESIA  02/28/2012   Procedure: EXAM UNDER ANESTHESIA;  Surgeon: Donato Heinz, MD;  Location: [facility];  Service: General;  Laterality: N/A;   EXCISION MASS UPPER EXTREMETIES Left 11/13/2019   Procedure: EXCISION MASS UPPER EXTREMETIES ring finger left;  Surgeon: Carole Civil, MD;  Location: [facility];  Service: Orthopedics;  Laterality: Left;   GANGLION CYST EXCISION Left    HYSTEROSCOPY WITH D & C N/A 05/23/2013   Procedure: DILATATION AND CURETTAGE /HYSTEROSCOPY;  Surgeon: Jonnie Kind, MD;  Location: [facility];  Service: Gynecology;  Laterality: N/A;   POLYPECTOMY N/A 05/23/2013   Procedure: ENDOMETRIAL POLYP REMOVAL;  Surgeon: Jonnie Kind, MD;  Location: [facility];  Service: Gynecology;  Laterality: N/A;   SPHINCTEROTOMY  02/28/2012   Procedure: SPHINCTEROTOMY;  Surgeon: Donato Heinz, MD;  Location: [facility];  Service: General;  Laterality: N/A;  Lateral Internal Sphincterotomy   TRIGGER FINGER RELEASE Left  ring finger   TUBAL LIGATION      Family History  Problem Relation Age of Onset   Hypertension Mother    Arthritis Mother    COPD Mother    Diabetes Mother    Heart disease Mother    Hypertension Father    Parkinson's disease Father    Colon cancer Neg Hx     Social History   Socioeconomic History   Marital status: Single    Spouse name: Not on file   Number of children: 3   Years of education: 14   Highest education level: Not on file  Occupational History   Occupation: home health aid  Tobacco Use   Smoking status: Some Days    Packs/day: 0.25    Years: 20.00    Total pack years: 5.00    Types:  Cigarettes   Smokeless tobacco: Never   Tobacco comments:    smokes 1 cig daily  Vaping Use   Vaping Use: Never used  Substance and Sexual Activity   Alcohol use: Yes    Comment: occasional   Drug use: No   Sexual activity: Yes    Birth control/protection: Surgical    Comment: tubal and ablation  Other Topics Concern   Not on file  Social History Narrative   Degree in child care   Currently is a home health aid   Lives at home with youngest daughter Jeanella Craze senior this year    Son 24-Jalon  in college for sociology    Daughter Hiltonia      Enjoys: write, reading, cooking, drawing      Diet: eats all food groups   Caffeine: pepsi weekly    Water: 6-8 cups    Juice some times      Wears seat belt    Smoke detectors    Does not use phone with driving.   Social Determinants of Health   Financial Resource Strain: High Risk (07/20/2021)   Overall Financial Resource Strain (CARDIA)    Difficulty of Paying Living Expenses: Hard  Food Insecurity: No Food Insecurity (07/20/2021)   Hunger Vital Sign    Worried About Running Out of Food in the Last Year: Never true    Ran Out of Food in the Last Year: Never true  Transportation Needs: No Transportation Needs (07/20/2021)   PRAPARE - Hydrologist (Medical): No    Lack of Transportation (Non-Medical): No  Physical Activity: Sufficiently Active (10/12/2020)   Exercise Vital Sign    Days of Exercise per Week: 5 days    Minutes of Exercise per Session: 40 min  Stress: Stress Concern Present (08/26/2021)   [place]    Feeling of Stress : Very much  Social Connections: Moderately Isolated (10/12/2020)   Social Connection and Isolation Panel [NHANES]    Frequency of Communication with Friends and Family: Three times a week    Frequency of Social Gatherings with Friends and Family: Once a week    Attends Religious  Services: More than 4 times per year    Active Member of [employer] or Organizations: No    Attends Archivist Meetings: Never    Marital Status: Never married  Intimate Partner Violence: Not At Risk (10/12/2020)   Humiliation, Afraid, Rape, and Kick questionnaire    Fear of Current or Ex-Partner: No    Emotionally Abused: No    Physically Abused: No  Sexually Abused: No    Outpatient Medications Prior to Visit  Medication Sig Dispense Refill   albuterol (PROAIR HFA) 108 (90 Base) MCG/ACT inhaler INHALE 1-2 PUFFS BY MOUTH EVERY 6 HOURS AS NEEDED FOR WHEEZE OR SHORTNESS OF BREATHMNT 8.5 each 0   cyclobenzaprine (FLEXERIL) 10 MG tablet Take 1 tablet (10 mg total) by mouth 2 (two) times daily as needed for muscle spasms. 20 tablet 0   HYDROcodone-acetaminophen (NORCO/VICODIN) 5-325 MG tablet One tablet every six hours for pain.  Limit 5 days. 11 tablet 0   ibuprofen (ADVIL) 600 MG tablet Take 1 tablet (600 mg total) by mouth every 8 (eight) hours as needed for moderate pain. 30 tablet 0   levocetirizine (XYZAL) 5 MG tablet Take 1 tablet (5 mg total) by mouth every evening. 30 tablet 5   naproxen (NAPROSYN) 500 MG tablet Take 1 tablet (500 mg total) by mouth 2 (two) times daily with a meal. 60 tablet 5   omeprazole (PRILOSEC) 20 MG capsule Take 1 capsule (20 mg total) by mouth daily. 14 capsule 0   hydrochlorothiazide (HYDRODIURIL) 25 MG tablet TAKE 1 TABLET BY MOUTH EVERY DAY (Patient not taking: Reported on 01/06/2022) 90 tablet 0   No facility-administered medications prior to visit.    Allergies  Allergen Reactions   Lisinopril Swelling    swelling to entire mouth.   Penicillins Rash    Has patient had a PCN reaction causing immediate rash, facial/tongue/throat swelling, SOB or lightheadedness with hypotension: no - next day Has patient had a PCN reaction causing severe rash involving mucus membranes or skin necrosis: No Has patient had a PCN reaction that required  hospitalization No Has patient had a PCN reaction occurring within the last 10 years: Yes If all of the above answers are "NO", then may proceed with Cephalosporin use.    Tramadol Other (See Comments)    dizziness    ROS Review of Systems  Constitutional:  Positive for fatigue. Negative for chills and fever.  HENT:  Negative for postnasal drip, sinus pressure, sinus pain, sore throat and trouble swallowing.   Eyes:  Negative for pain and discharge.  Respiratory:  Negative for cough and shortness of breath.   Cardiovascular:  Negative for chest pain and palpitations.  Gastrointestinal:  Negative for diarrhea, nausea and vomiting.  Genitourinary:  Negative for dysuria and hematuria.  Musculoskeletal:  Positive for arthralgias and back pain.  Skin:  Negative for rash.  Neurological:  Positive for seizures and syncope. Negative for weakness and numbness.      Objective:    Physical Exam Vitals reviewed.  Constitutional:      General: She is not in acute distress.    Appearance: She is not diaphoretic.  HENT:     Head: Normocephalic and atraumatic.     Nose: Nose normal.     Mouth/Throat:     Mouth: Mucous membranes are moist.  Eyes:     General: No scleral icterus.    Extraocular Movements: Extraocular movements intact.  Cardiovascular:     Rate and Rhythm: Normal rate and regular rhythm.     Pulses: Normal pulses.     Heart sounds: Normal heart sounds. No murmur heard. Pulmonary:     Breath sounds: Normal breath sounds. No wheezing or rales.  Musculoskeletal:        General: Tenderness (Left knee, with minimal swelling) present.     Cervical back: Neck supple. No tenderness.     Right lower leg: No edema.  Left lower leg: No edema.     Comments: Lumbar spine area tenderness  Skin:    General: Skin is warm.     Findings: No rash.  Neurological:     General: No focal deficit present.     Mental Status: She is alert and oriented to person, place, and time.   Psychiatric:        Mood and Affect: Mood normal.        Behavior: Behavior normal.     BP 122/86 (BP Location: Right Arm, Patient Position: Sitting, Cuff Size: Normal)   Pulse 72   Resp 18   Ht 5' 1.5" (1.562 m)   Wt 128 lb 9.6 oz (58.3 kg)   SpO2 98%   BMI 23.91 kg/m  Wt Readings from Last 3 Encounters:  01/06/22 128 lb 9.6 oz (58.3 kg)  01/03/22 125 lb (56.7 kg)  11/17/21 125 lb (56.7 kg)    Lab Results  Component Value Date   TSH 0.673 05/20/2019   Lab Results  Component Value Date   WBC 6.2 11/17/2021   HGB 14.9 11/17/2021   HCT 45.0 11/17/2021   MCV 97.4 11/17/2021   PLT 284 11/17/2021   Lab Results  Component Value Date   NA 142 11/17/2021   K 3.4 (L) 11/17/2021   CO2 26 11/17/2021   GLUCOSE 79 11/17/2021   BUN 9 11/17/2021   CREATININE 0.83 11/17/2021   BILITOT 1.1 03/24/2020   ALKPHOS 43 03/24/2020   AST 20 03/24/2020   ALT 20 03/24/2020   PROT 6.8 03/24/2020   ALBUMIN 3.7 03/24/2020   CALCIUM 9.5 11/17/2021   ANIONGAP 11 11/17/2021   Lab Results  Component Value Date   CHOL 199 05/20/2019   Lab Results  Component Value Date   HDL 95 05/20/2019   Lab Results  Component Value Date   LDLCALC 86 05/20/2019   Lab Results  Component Value Date   TRIG 105 05/20/2019   Lab Results  Component Value Date   CHOLHDL 2.1 05/20/2019   Lab Results  Component Value Date   HGBA1C 5.4 05/20/2019      Assessment & Plan:   Problem List Items Addressed This Visit       Cardiovascular and Mediastinum   Essential hypertension - Primary    BP Readings from Last 1 Encounters:  01/06/22 122/86  Well-controlled Counseled for compliance with the medications Advised DASH diet and moderate exercise/walking, at least 150 mins/week       Relevant Medications   hydrochlorothiazide (HYDRODIURIL) 25 MG tablet   Other Relevant Orders   TSH   CMP14+EGFR   CBC with Differential/Platelet     Respiratory   Mild intermittent asthma without  complication    Well-controlled with Albuterol PRN      Relevant Orders   CBC with Differential/Platelet     Nervous and Auditory   Seizure disorder ([place])    Her episodes of syncope likely due to seizures Started Keppra Referred to Neurology Advised to go to ER if she has any episode of syncope or seizure      Relevant Medications   levETIRAcetam (KEPPRA) 500 MG tablet   Other Relevant Orders   Ambulatory referral to Neurology   TSH   CMP14+EGFR   CBC with Differential/Platelet     Musculoskeletal and Integument   Arthritis of left knee    Last X-ray of left knee reviewed - mild arthritic changes Followed by Orthopedic surgery On Norco PRN Has had steroid  injections        Other   Tobacco abuse    Asked about quitting: confirms that he/she currently smokes cigarettes Advise to quit smoking: Educated about QUITTING to reduce the risk of cancer, cardio and cerebrovascular disease. Assess willingness: Unwilling to quit at this time, but is working on cutting back. Assist with counseling and pharmacotherapy: Counseled for 5 minutes and literature provided. Arrange for follow up: follow up in 3 months and continue to offer help.       Other Visit Diagnoses     Onychomycosis       Relevant Orders   Ambulatory referral to Podiatry   Mixed hyperlipidemia       Relevant Medications   hydrochlorothiazide (HYDRODIURIL) 25 MG tablet   Other Relevant Orders   Lipid panel   Need for hepatitis C screening test       Relevant Orders   Hepatitis C Antibody   Prediabetes       Relevant Orders   Hemoglobin A1c   Vitamin D deficiency       Relevant Orders   VITAMIN D 25 Hydroxy (Vit-D Deficiency, Fractures)       Meds ordered this encounter  Medications   levETIRAcetam (KEPPRA) 500 MG tablet    Sig: Take 1 tablet (500 mg total) by mouth 2 (two) times daily.    Dispense:  60 tablet    Refill:  2   hydrochlorothiazide (HYDRODIURIL) 25 MG tablet    Sig: Take 1 tablet  (25 mg total) by mouth daily.    Dispense:  90 tablet    Refill:  1    Follow-up: Return in about 3 months (around 04/08/2022) for Annual physical.    Lindell Spar, MD

## 2022-01-06 NOTE — Assessment & Plan Note (Signed)
Asked about quitting: confirms that he/she currently smokes cigarettes Advise to quit smoking: Educated about QUITTING to reduce the risk of cancer, cardio and cerebrovascular disease. Assess willingness: Unwilling to quit at this time, but is working on cutting back. Assist with counseling and pharmacotherapy: Counseled for 5 minutes and literature provided. Arrange for follow up: follow up in 3 months and continue to offer help.

## 2022-01-06 NOTE — Assessment & Plan Note (Signed)
Her episodes of syncope likely due to seizures Started Keppra Referred to Neurology Advised to go to ER if she has any episode of syncope or seizure

## 2022-01-06 NOTE — Assessment & Plan Note (Signed)
Last X-ray of left knee reviewed - mild arthritic changes Followed by Orthopedic surgery On Norco PRN Has had steroid injections

## 2022-01-09 ENCOUNTER — Telehealth: Payer: Self-pay | Admitting: Orthopaedic Surgery

## 2022-01-09 ENCOUNTER — Ambulatory Visit: Payer: Medicaid Other | Admitting: Adult Health

## 2022-01-09 NOTE — Telephone Encounter (Signed)
Patient called requesting refill for her pain medicine.    HYDROcodone-acetaminophen (NORCO/VICODIN) 5-325 MG tablet  Pharmacy: [facility] on [address]

## 2022-01-10 MED ORDER — HYDROCODONE-ACETAMINOPHEN 5-325 MG PO TABS: ORAL_TABLET | ORAL | 0 refills | Status: DC

## 2022-01-17 ENCOUNTER — Telehealth: Payer: Self-pay | Admitting: Orthopaedic Surgery

## 2022-01-17 MED ORDER — HYDROCODONE-ACETAMINOPHEN 5-325 MG PO TABS: ORAL_TABLET | ORAL | 0 refills | Status: DC

## 2022-01-17 NOTE — Telephone Encounter (Signed)
Patient requests refill: HYDROcodone-acetaminophen (NORCO/VICODIN) 5-325 MG tablet 11 tablet       [facility], [address] [employer]

## 2022-01-18 ENCOUNTER — Telehealth: Payer: Self-pay | Admitting: Radiology

## 2022-01-18 ENCOUNTER — Ambulatory Visit (INDEPENDENT_AMBULATORY_CARE_PROVIDER_SITE_OTHER): Payer: Medicaid Other | Admitting: Orthopedic Surgery

## 2022-01-18 ENCOUNTER — Other Ambulatory Visit: Payer: Self-pay | Admitting: Orthopedic Surgery

## 2022-01-18 ENCOUNTER — Encounter: Payer: Self-pay | Admitting: Orthopedic Surgery

## 2022-01-18 VITALS — BP 150/96 | HR 68 | Ht 61.5 in | Wt 128.0 lb

## 2022-01-18 DIAGNOSIS — M5442 Lumbago with sciatica, left side: Secondary | ICD-10-CM | POA: Diagnosis not present

## 2022-01-18 DIAGNOSIS — G8929 Other chronic pain: Secondary | ICD-10-CM

## 2022-01-18 DIAGNOSIS — M4306 Spondylolysis, lumbar region: Secondary | ICD-10-CM | POA: Diagnosis not present

## 2022-01-18 DIAGNOSIS — M79605 Pain in left leg: Secondary | ICD-10-CM

## 2022-01-18 DIAGNOSIS — M48061 Spinal stenosis, lumbar region without neurogenic claudication: Secondary | ICD-10-CM | POA: Diagnosis not present

## 2022-01-18 DIAGNOSIS — M2578 Osteophyte, vertebrae: Secondary | ICD-10-CM

## 2022-01-18 DIAGNOSIS — M25562 Pain in left knee: Secondary | ICD-10-CM | POA: Diagnosis not present

## 2022-01-18 DIAGNOSIS — M545 Low back pain, unspecified: Secondary | ICD-10-CM | POA: Diagnosis not present

## 2022-01-18 NOTE — Progress Notes (Signed)
Chief Complaint  Patient presents with   Knee Pain    Left / tearful today can't work due to knee pain, states getting worse     Consult intraoffice regarding left knee pain.  Patient presents with an MRI of her left knee as well as severe pain in the left knee which is chronic this is persistent despite injections.  MRI shows patellar cartilage preserved minimal central trochlear chondromalacia normal TT-TG distance no acute findings of internal derangement  Despite this MRI report and I reviewed the images and agree that there is no intra-articular pathology  Patient says she has severe knee pain  She also has a history of lumbar spine disease her last MRI was 2016 at that time she had worsening lower back pain mainly in the left lower extremity  That report indicates there was no abnormality above L3 and 4 but at L4-5 there is desiccation of the disc with endplate osteophytes and shallow disc protrusion facet arthritis ligament hypertrophy mild bilateral lateral recess stenosis and then at L5-S1 there was degeneration of the disc with endplate osteophytes bulging facet degeneration canal was free of stenosis however there was foraminal stenosis bilaterally  Based on her complaints and imaging I would recommend she have a new MRI of her back to evaluate that as a possible source of knee pain  However no surgical indications for knee surgery at this time

## 2022-01-18 NOTE — Telephone Encounter (Signed)
MRI likely to be denied, Details (specific dates and duration) of active conservative care (PT/HEP/Chiropractic care) for at least 6 weeks in past 6 months  I have sent a PT Order and advised patient  that the order may be denied, so I m sending PT order now.   She voiced understanding

## 2022-01-18 NOTE — Patient Instructions (Signed)
While we are working on your approval for MRI please go ahead and call to schedule your appointment with [facility] within at least one (1) week.   Central Scheduling [phone]  

## 2022-01-19 ENCOUNTER — Telehealth: Payer: Self-pay | Admitting: Internal Medicine

## 2022-01-19 NOTE — Telephone Encounter (Signed)
noted 

## 2022-01-19 NOTE — Telephone Encounter (Signed)
PATIENT FYI    PT called stating that she is not able to get an MRI until she has 6 wks of PT. Her states date September 6.

## 2022-01-19 NOTE — Telephone Encounter (Signed)
Have her follow with Dr Raliegh Ip   No knee surgery needed   I thought this might be the case , that's what I was aking u, if any other info was needed

## 2022-01-23 ENCOUNTER — Telehealth: Payer: Self-pay | Admitting: Orthopaedic Surgery

## 2022-01-23 MED ORDER — HYDROCODONE-ACETAMINOPHEN 5-325 MG PO TABS: ORAL_TABLET | ORAL | 0 refills | Status: DC

## 2022-01-23 NOTE — Telephone Encounter (Signed)
Patient requests refill HYDROcodone-acetaminophen (NORCO/VICODIN) 5-325 MG tablet 11 tablet      [place], [place]

## 2022-01-30 ENCOUNTER — Other Ambulatory Visit: Payer: Self-pay | Admitting: Orthopaedic Surgery

## 2022-01-30 MED ORDER — HYDROCODONE-ACETAMINOPHEN 5-325 MG PO TABS: ORAL_TABLET | ORAL | 0 refills | Status: DC

## 2022-01-30 NOTE — Telephone Encounter (Signed)
Patient called requesting refill on her pain medicine   HYDROcodone-acetaminophen (NORCO/VICODIN) 5-325 MG tablet  Pharmacy: [facility] on [address]

## 2022-01-31 ENCOUNTER — Ambulatory Visit: Payer: Medicaid Other | Admitting: Orthopaedic Surgery

## 2022-01-31 ENCOUNTER — Telehealth: Payer: Self-pay

## 2022-01-31 NOTE — Telephone Encounter (Signed)
Disability form / need medical records once provider fills out the physician physical condition   Copied Noted sleeved

## 2022-02-06 ENCOUNTER — Telehealth: Payer: Self-pay

## 2022-02-06 MED ORDER — HYDROCODONE-ACETAMINOPHEN 5-325 MG PO TABS: ORAL_TABLET | ORAL | 0 refills | Status: DC

## 2022-02-06 NOTE — Telephone Encounter (Signed)
Hydrocodone-Acetaminophen 5/325 MG  Qty 11 Tablets  PATIENT USES [facility] ON [address] 

## 2022-02-07 DIAGNOSIS — Z0279 Encounter for issue of other medical certificate: Secondary | ICD-10-CM

## 2022-02-07 NOTE — Telephone Encounter (Signed)
Faxed forms to [phone] and sent a copy to medical records for further medical records.

## 2022-02-08 ENCOUNTER — Other Ambulatory Visit: Payer: Self-pay

## 2022-02-08 ENCOUNTER — Emergency Department (HOSPITAL_COMMUNITY)
Admission: EM | Admit: 2022-02-08 | Discharge: 2022-02-08 | Disposition: A | Discharge status: Home / Self Care | Payer: Medicaid Other | Attending: Emergency Medicine | Admitting: Emergency Medicine

## 2022-02-08 ENCOUNTER — Encounter (HOSPITAL_COMMUNITY): Payer: Self-pay | Admitting: *Deleted

## 2022-02-08 DIAGNOSIS — M25562 Pain in left knee: Secondary | ICD-10-CM | POA: Diagnosis not present

## 2022-02-08 MED ORDER — IBUPROFEN 600 MG PO TABS: 600.0000 mg | ORAL_TABLET | Freq: Four times a day (QID) | ORAL | 0 refills | Status: DC | PRN

## 2022-02-08 MED ORDER — KETOROLAC TROMETHAMINE 15 MG/ML IJ SOLN
15.0000 mg | Freq: Once | INTRAMUSCULAR | Status: AC
Start: 2022-02-08 — End: 2022-02-08
  Administered 2022-02-08: 15 mg via INTRAMUSCULAR
  Filled 2022-02-08: qty 1

## 2022-02-08 MED ORDER — OXYCODONE-ACETAMINOPHEN 5-325 MG PO TABS
2.0000 | ORAL_TABLET | Freq: Once | ORAL | Status: AC
  Administered 2022-02-08: 2 via ORAL
  Filled 2022-02-08: qty 2

## 2022-02-08 NOTE — ED Provider Notes (Signed)
[facility] EMERGENCY DEPARTMENT Provider Note   CSN: [id number] Arrival date & time: 02/08/22  1039     History Chief Complaint  Patient presents with   Knee Pain    Judy Cross is a 56 y.o. female patient who presents to the emergency department today for further evaluation of chronic ongoing left-sided knee pain.  Her knee has been bothering her for months and she has been going to orthopedics and getting steroid injections.  She states that steroid injections are no longer working.  She is supposed to start physical therapy for the next 6 weeks and ultimately get an MRI of her back which is thought to be the cause of her knee pain as she does have multiple herniated disks.  She denies any bowel or bladder incontinence.  She denies any new injury.  Patient wanting better pain control.   Knee Pain      Home Medications Prior to Admission medications   Medication Sig Start Date End Date Taking? Authorizing Provider  ibuprofen (ADVIL) 600 MG tablet Take 1 tablet (600 mg total) by mouth every 6 (six) hours as needed. 02/08/22  Yes Raul Del, Izabella Marcantel M, PA-C  albuterol (PROAIR HFA) 108 (90 Base) MCG/ACT inhaler INHALE 1-2 PUFFS BY MOUTH EVERY 6 HOURS AS NEEDED FOR WHEEZE OR SHORTNESS OF BREATHMNT 01/20/21   Lindell Spar, MD  cyclobenzaprine (FLEXERIL) 10 MG tablet Take 1 tablet (10 mg total) by mouth 2 (two) times daily as needed for muscle spasms. 12/02/20   Lindell Spar, MD  hydrochlorothiazide (HYDRODIURIL) 25 MG tablet Take 1 tablet (25 mg total) by mouth daily. 01/06/22   Lindell Spar, MD  HYDROcodone-acetaminophen (NORCO/VICODIN) 5-325 MG tablet One tablet every six hours for pain.  Limit 5 days. 02/06/22   [name], MD  levETIRAcetam (KEPPRA) 500 MG tablet Take 1 tablet (500 mg total) by mouth 2 (two) times daily. 01/06/22   Lindell Spar, MD  levocetirizine (XYZAL) 5 MG tablet Take 1 tablet (5 mg total) by mouth every evening. 12/02/20   Lindell Spar, MD  naproxen  (NAPROSYN) 500 MG tablet Take 1 tablet (500 mg total) by mouth 2 (two) times daily with a meal. 10/11/21   [name], MD  omeprazole (PRILOSEC) 20 MG capsule Take 1 capsule (20 mg total) by mouth daily. 11/17/21   [name], MD      Allergies    Lisinopril, Penicillins, and Tramadol    Review of Systems   Review of Systems  All other systems reviewed and are negative.   Physical Exam Updated Vital Signs BP (!) 128/92 (BP Location: Right Arm)   Pulse 68   Temp 98.8 F (37.1 C) (Oral)   Resp 17   Ht '5\' 1"'$  (1.549 m)   Wt 59.9 kg   SpO2 100%   BMI 24.94 kg/m  Physical Exam Vitals and nursing note reviewed.  Constitutional:      Appearance: Normal appearance.  HENT:     Head: Normocephalic and atraumatic.  Eyes:     General:        Right eye: No discharge.        Left eye: No discharge.     Conjunctiva/sclera: Conjunctivae normal.  Pulmonary:     Effort: Pulmonary effort is normal.  Musculoskeletal:     Comments: There is no tenderness or swelling over the left knee.  Knee is stable to valgus and varus stress testing.  Skin:    General: Skin is warm  and dry.     Findings: No rash.  Neurological:     General: No focal deficit present.     Mental Status: She is alert.  Psychiatric:        Mood and Affect: Mood normal.        Behavior: Behavior normal.     ED Results / Procedures / Treatments   Labs (all labs ordered are listed, but only abnormal results are displayed) Labs Reviewed - No data to display  EKG None  Radiology No results found.  Procedures Procedures    Medications Ordered in ED Medications  ketorolac (TORADOL) 15 MG/ML injection 15 mg (15 mg Intramuscular Given 02/08/22 1141)  oxyCODONE-acetaminophen (PERCOCET/ROXICET) 5-325 MG per tablet 2 tablet (2 tablets Oral Given 02/08/22 1140)    ED Course/ Medical Decision Making/ A&P Clinical Course as of 02/08/22 1306  Wed Feb 08, 2022  1304 Patient's knee is feeling better after  Toradol and Percocet.  Going to place her on a 600 mg ibuprofen regimen for the next week and she will follow-up with her PT as scheduled. [CF]    Clinical Course User Index [CF] Hendricks Limes, PA-C                           Medical Decision Making [name] is a 56 y.o. female patient who presents to the emergency department for further evaluation of chronic left knee pain.  Patient has had imaging over the left knee and lower back in the past.  She will be scheduled for an MRI after 6 weeks of physical therapy over the back.  Did not feel like repeating imaging would be necessary at this time as there is no new injury.  Patient really requesting pain control.  She has no clinical indication of cauda equina.  I will give her a shot of Toradol and 2 of Percocet and plan to reevaluate.  Patient feeling better.  Patient willing to go home.  She will follow-up with her PT as scheduled.  I will place her on a 600 mg ibuprofen schedule the next week.  She is safe for discharge at this time.  Strict return precautions were discussed.  Risk Prescription drug management.   Final Clinical Impression(s) / ED Diagnoses Final diagnoses:  Acute pain of left knee    Rx / DC Orders ED Discharge Orders          Ordered    ibuprofen (ADVIL) 600 MG tablet  Every 6 hours PRN        02/08/22 1305              Hendricks Limes, PA-C 02/08/22 1306    Godfrey Pick, MD 02/11/22 [phone]

## 2022-02-08 NOTE — Discharge Instructions (Signed)
Please take ibuprofen every 6 hours as needed for anti-inflammatories.  Please follow-up with your physical therapist as scheduled.  Return to the emergency department for any worsening symptoms that you might have.

## 2022-02-08 NOTE — ED Triage Notes (Signed)
Pt c/o left knee pain x 2 weeks. Pt reports she has a cyst on the back of her knee. Pt also reports herniated disc in her lower back that the doctor told her causes her knee pain. Pt is getting ready to start physical therapy soon for the knee pain. Denies recent injury.

## 2022-02-14 ENCOUNTER — Ambulatory Visit (INDEPENDENT_AMBULATORY_CARE_PROVIDER_SITE_OTHER): Payer: Medicaid Other | Admitting: Orthopaedic Surgery

## 2022-02-14 ENCOUNTER — Encounter: Payer: Self-pay | Admitting: Orthopaedic Surgery

## 2022-02-14 DIAGNOSIS — M25562 Pain in left knee: Secondary | ICD-10-CM

## 2022-02-14 DIAGNOSIS — F17210 Nicotine dependence, cigarettes, uncomplicated: Secondary | ICD-10-CM

## 2022-02-14 DIAGNOSIS — G8929 Other chronic pain: Secondary | ICD-10-CM | POA: Diagnosis not present

## 2022-02-14 MED ORDER — HYDROCODONE-ACETAMINOPHEN 5-325 MG PO TABS: ORAL_TABLET | ORAL | 0 refills | Status: DC

## 2022-02-14 MED ORDER — METHYLPREDNISOLONE ACETATE 40 MG/ML IJ SUSP
40.0000 mg | Freq: Once | INTRAMUSCULAR | Status: AC
  Administered 2022-02-14: 40 mg via INTRA_ARTICULAR

## 2022-02-14 NOTE — Progress Notes (Signed)
PROCEDURE NOTE:  The patient requests injections of the left knee , verbal consent was obtained.  The left knee was prepped appropriately after time out was performed.   Sterile technique was observed and injection of 1 cc of DepoMedrol 40 mg with several cc's of plain xylocaine. Anesthesia was provided by ethyl chloride and a 20-gauge needle was used to inject the knee area. The injection was tolerated well.  A band aid dressing was applied.  The patient was advised to apply ice later today and tomorrow to the injection sight as needed.  Encounter Diagnoses  Name Primary?   Chronic pain of left knee Yes   Cigarette nicotine dependence without complication    She is to begin PT tomorrow for the right knee.  I will refill pain medicine.  Return in three weeks.  Call if any problem.  Precautions discussed.  Electronically Signed [name], MD 9/5/20238:28 AM

## 2022-02-14 NOTE — Addendum Note (Signed)
Addended by: Obie Dredge A on: 02/14/2022 11:20 AM   Modules accepted: Orders

## 2022-02-15 ENCOUNTER — Ambulatory Visit (HOSPITAL_COMMUNITY): Payer: Medicaid Other | Admitting: Physical Therapy

## 2022-02-20 ENCOUNTER — Telehealth: Payer: Self-pay | Admitting: Orthopaedic Surgery

## 2022-02-20 NOTE — Telephone Encounter (Signed)
Patient called for refill:  HYDROcodone-acetaminophen (NORCO/VICODIN) 5-325 MG tablet 11 tablet       [employer], [employer], [employer]

## 2022-02-20 NOTE — Telephone Encounter (Signed)
Done

## 2022-02-21 ENCOUNTER — Ambulatory Visit (HOSPITAL_COMMUNITY): Payer: Medicaid Other | Admitting: Physical Therapy

## 2022-02-21 MED ORDER — HYDROCODONE-ACETAMINOPHEN 5-325 MG PO TABS: ORAL_TABLET | ORAL | 0 refills | Status: DC

## 2022-02-28 ENCOUNTER — Telehealth: Payer: Self-pay

## 2022-02-28 NOTE — Telephone Encounter (Signed)
Hydrocodone-Acetaminophen 5/325 MG  Qty 11 Tablets  PATIENT USES [facility] ON [address] 

## 2022-03-01 MED ORDER — HYDROCODONE-ACETAMINOPHEN 5-325 MG PO TABS: ORAL_TABLET | ORAL | 0 refills | Status: DC

## 2022-03-07 ENCOUNTER — Ambulatory Visit (INDEPENDENT_AMBULATORY_CARE_PROVIDER_SITE_OTHER): Payer: Medicaid Other | Admitting: Orthopaedic Surgery

## 2022-03-07 ENCOUNTER — Encounter: Payer: Self-pay | Admitting: Orthopaedic Surgery

## 2022-03-07 ENCOUNTER — Ambulatory Visit (INDEPENDENT_AMBULATORY_CARE_PROVIDER_SITE_OTHER): Payer: Medicaid Other

## 2022-03-07 VITALS — BP 152/98 | HR 62 | Ht 61.5 in | Wt 125.0 lb

## 2022-03-07 DIAGNOSIS — F17210 Nicotine dependence, cigarettes, uncomplicated: Secondary | ICD-10-CM | POA: Diagnosis not present

## 2022-03-07 DIAGNOSIS — M5442 Lumbago with sciatica, left side: Secondary | ICD-10-CM

## 2022-03-07 DIAGNOSIS — G8929 Other chronic pain: Secondary | ICD-10-CM

## 2022-03-07 DIAGNOSIS — M25562 Pain in left knee: Secondary | ICD-10-CM

## 2022-03-07 MED ORDER — HYDROCODONE-ACETAMINOPHEN 5-325 MG PO TABS: ORAL_TABLET | ORAL | 0 refills | Status: DC

## 2022-03-07 NOTE — Progress Notes (Signed)
My back hurts too  She has chronic left knee pain and has had lower back pain also.  She has seen Dr. Aline Brochure for this recently.  She has been going to PT.  She has no new trauma. She has left sided sciatica with pain running to the left knee and beyond to calf.    ROM of the left knee is 0 to 115, stable, some crepitus, slight effusion, slight limp to the left.  Spine/Pelvis examination:  Inspection:  Overall, sacoiliac joint benign and hips nontender; without crepitus or defects.   Thoracic spine inspection: Alignment normal without kyphosis present   Lumbar spine inspection:  Alignment  with normal lumbar lordosis, without scoliosis apparent.   Thoracic spine palpation:  without tenderness of spinal processes   Lumbar spine palpation: without tenderness of lumbar area; without tightness of lumbar muscles    Range of Motion:   Lumbar flexion, forward flexion is normal without pain or tenderness    Lumbar extension is full without pain or tenderness   Left lateral bend is normal without pain or tenderness   Right lateral bend is normal without pain or tenderness   Straight leg raising is normal  Strength & tone: normal   Stability overall normal stability  X-rays of the lumbar spine was done, reported separately.  Encounter Diagnoses  Name Primary?   Chronic midline low back pain with left-sided sciatica Yes   Chronic pain of left knee    Cigarette nicotine dependence without complication    Continue her PT.  Return in three weeks. Consider MRI then.  I have reviewed the [place] web site prior to prescribing narcotic medicine for this patient.   Call if any problem.  Precautions discussed.  Electronically Signed [name], MD 9/26/20238:21 AM

## 2022-03-10 ENCOUNTER — Ambulatory Visit (INDEPENDENT_AMBULATORY_CARE_PROVIDER_SITE_OTHER): Payer: Medicaid Other | Admitting: Family Medicine

## 2022-03-10 ENCOUNTER — Encounter: Payer: Self-pay | Admitting: Family Medicine

## 2022-03-10 VITALS — BP 108/75 | HR 78 | Ht 61.5 in | Wt 125.0 lb

## 2022-03-10 DIAGNOSIS — G8929 Other chronic pain: Secondary | ICD-10-CM | POA: Diagnosis not present

## 2022-03-10 DIAGNOSIS — M5442 Lumbago with sciatica, left side: Secondary | ICD-10-CM

## 2022-03-10 DIAGNOSIS — J4520 Mild intermittent asthma, uncomplicated: Secondary | ICD-10-CM | POA: Diagnosis not present

## 2022-03-10 MED ORDER — KETOROLAC TROMETHAMINE 30 MG/ML IJ SOLN
30.0000 mg | Freq: Once | INTRAMUSCULAR | Status: AC
  Administered 2022-03-10: 30 mg via INTRAMUSCULAR

## 2022-03-10 MED ORDER — ALBUTEROL SULFATE HFA 108 (90 BASE) MCG/ACT IN AERS: INHALATION_SPRAY | RESPIRATORY_TRACT | 0 refills | Status: DC

## 2022-03-10 NOTE — Assessment & Plan Note (Signed)
Toradol 30 mg IM injection given in the clinic Encouraged to continue taking hydrocodone acetaminophen 5-325 mg every 6 hours for pain  Encouraged to continue physical therapy encouraged and to follow up with Dr.Keeling in 3 weeks

## 2022-03-10 NOTE — Progress Notes (Signed)
   Acute Office Visit  Subjective:     Patient ID: Judy Cross, female    DOB: 05-11-1966, 56 y.o.   MRN: [id number]  Chief Complaint  Patient presents with   Back Pain    Pt reports back pain, has been dealing with this for many years, as well as left knee pain.      HPI The patient is in today for chronic midline low back pain with left-sided sciatica. Pain is rated 9 out of 10, and noted to be following up with Dr. Luna Glasgow. She is currently doing physical therapy and will attend her third section on April 11, 2022. She reports no new injury or trauma to the lower back and takes hydrocodone acetaminophen 5-325 mg every 6 hours for pain. She notes the pain medication is no longer effective as she has built tolerance. She reports that she will follow up with Dr.Keeling in 3 weeks. She denies any dysuria or hematuria, no saddle anesthesia,  no history of cancer, no IV drug use history, and no chest pain or shortness of breath.      Review of Systems  Constitutional:  Negative for chills, fever and malaise/fatigue.  Respiratory:  Negative for cough.   Cardiovascular:  Negative for chest pain and orthopnea.  Musculoskeletal:  Positive for back pain.  Neurological:  Negative for dizziness and headaches.        Objective:    BP 108/75   Pulse 78   Ht 5' 1.5" (1.562 m)   Wt 125 lb (56.7 kg)   SpO2 96%   BMI 23.24 kg/m    Physical Exam HENT:     Head: Normocephalic.  Cardiovascular:     Rate and Rhythm: Normal rate and regular rhythm.     Pulses: Normal pulses.     Heart sounds: Normal heart sounds.  Musculoskeletal:     Comments: Tenderness with palpation at the lumbar spine  Neurological:     Mental Status: She is alert.     No results found for any visits on 03/10/22.      Assessment & Plan:   Problem List Items Addressed This Visit       Respiratory   Mild intermittent asthma without complication    Refilled albuterol inhaler      Relevant  Medications   albuterol (PROAIR HFA) 108 (90 Base) MCG/ACT inhaler     Nervous and Auditory   Chronic midline low back pain with left-sided sciatica    Toradol 30 mg IM injection given in the clinic Encouraged to continue taking hydrocodone acetaminophen 5-325 mg every 6 hours for pain  Encouraged to continue physical therapy encouraged and to follow up with Dr.Keeling in 3 weeks           Other Visit Diagnoses     Chronic left-sided low back pain with left-sided sciatica    -  Primary   Relevant Medications   ketorolac (TORADOL) 30 MG/ML injection 30 mg (Completed)       Meds ordered this encounter  Medications   ketorolac (TORADOL) 30 MG/ML injection 30 mg   albuterol (PROAIR HFA) 108 (90 Base) MCG/ACT inhaler    Sig: INHALE 1-2 PUFFS BY MOUTH EVERY 6 HOURS AS NEEDED FOR WHEEZE OR SHORTNESS OF BREATHMNT    Dispense:  8.5 each    Refill:  0    Return if symptoms worsen or fail to improve.  [name], FNP

## 2022-03-10 NOTE — Assessment & Plan Note (Signed)
-   Refilled albuterol inhaler 

## 2022-03-10 NOTE — Patient Instructions (Addendum)
I appreciate the opportunity to provide care to you today!    Follow up:  Dr. Posey Pronto      Please continue to a heart-healthy diet and increase your physical activities. Try to exercise for 36mns at least three times a week.      It was a pleasure to see you and I look forward to continuing to work together on your health and well-being. Please do not hesitate to call the office if you need care or have questions about your care.   Have a wonderful day and week. With Gratitude, GAlvira MondayMSN, FNP-BC

## 2022-03-14 ENCOUNTER — Telehealth: Payer: Self-pay | Admitting: Orthopaedic Surgery

## 2022-03-14 MED ORDER — HYDROCODONE-ACETAMINOPHEN 5-325 MG PO TABS: ORAL_TABLET | ORAL | 0 refills | Status: DC

## 2022-03-14 NOTE — Telephone Encounter (Signed)
Patient called for refill:        HYDROcodone-acetaminophen (NORCO/VICODIN) 5-325 MG tablet 11 tablet   [employer], [place], [place]

## 2022-03-16 ENCOUNTER — Telehealth: Payer: Self-pay

## 2022-03-16 NOTE — Telephone Encounter (Signed)
Disability forms (forms in front folder)  Copied Noted Sleeved

## 2022-03-20 ENCOUNTER — Emergency Department (HOSPITAL_COMMUNITY)
Admission: EM | Admit: 2022-03-20 | Discharge: 2022-03-20 | Disposition: A | Discharge status: Other Acute Inpatient Hospital | Payer: Medicaid Other

## 2022-03-21 ENCOUNTER — Encounter: Payer: Self-pay | Admitting: Internal Medicine

## 2022-03-21 ENCOUNTER — Ambulatory Visit (INDEPENDENT_AMBULATORY_CARE_PROVIDER_SITE_OTHER): Payer: Medicaid Other | Admitting: Internal Medicine

## 2022-03-21 ENCOUNTER — Other Ambulatory Visit: Payer: Self-pay | Admitting: Radiology

## 2022-03-21 VITALS — BP 118/82 | HR 72 | Resp 18 | Ht 61.5 in | Wt 127.6 lb

## 2022-03-21 DIAGNOSIS — J4520 Mild intermittent asthma, uncomplicated: Secondary | ICD-10-CM | POA: Diagnosis not present

## 2022-03-21 DIAGNOSIS — F321 Major depressive disorder, single episode, moderate: Secondary | ICD-10-CM

## 2022-03-21 DIAGNOSIS — R7303 Prediabetes: Secondary | ICD-10-CM | POA: Diagnosis not present

## 2022-03-21 DIAGNOSIS — I10 Essential (primary) hypertension: Secondary | ICD-10-CM

## 2022-03-21 DIAGNOSIS — E559 Vitamin D deficiency, unspecified: Secondary | ICD-10-CM | POA: Diagnosis not present

## 2022-03-21 DIAGNOSIS — G40909 Epilepsy, unspecified, not intractable, without status epilepticus: Secondary | ICD-10-CM | POA: Diagnosis not present

## 2022-03-21 DIAGNOSIS — W19XXXA Unspecified fall, initial encounter: Secondary | ICD-10-CM

## 2022-03-21 DIAGNOSIS — M48061 Spinal stenosis, lumbar region without neurogenic claudication: Secondary | ICD-10-CM

## 2022-03-21 DIAGNOSIS — E782 Mixed hyperlipidemia: Secondary | ICD-10-CM | POA: Diagnosis not present

## 2022-03-21 DIAGNOSIS — Z23 Encounter for immunization: Secondary | ICD-10-CM

## 2022-03-21 DIAGNOSIS — Z1159 Encounter for screening for other viral diseases: Secondary | ICD-10-CM | POA: Diagnosis not present

## 2022-03-21 DIAGNOSIS — E119 Type 2 diabetes mellitus without complications: Secondary | ICD-10-CM | POA: Diagnosis not present

## 2022-03-21 MED ORDER — HYDROCODONE-ACETAMINOPHEN 5-325 MG PO TABS: ORAL_TABLET | ORAL | 0 refills | Status: DC

## 2022-03-21 MED ORDER — SERTRALINE HCL 25 MG PO TABS: 25.0000 mg | ORAL_TABLET | Freq: Every day | ORAL | 3 refills | Status: DC

## 2022-03-21 MED ORDER — LEVETIRACETAM 500 MG PO TABS: 500.0000 mg | ORAL_TABLET | Freq: Two times a day (BID) | ORAL | 2 refills | Status: DC

## 2022-03-21 NOTE — Patient Instructions (Signed)
Please start taking Zoloft as prescribed.  Please continue taking other medications as prescribed.  Please continue to follow low salt diet and ambulate as tolerated.

## 2022-03-21 NOTE — Telephone Encounter (Signed)
Patient called [facility] requesting refill.  [facility].  Can you please advise on refill in Dr Brooke Bonito absence?  Thanks.

## 2022-03-22 ENCOUNTER — Other Ambulatory Visit: Payer: Self-pay | Admitting: *Deleted

## 2022-03-22 ENCOUNTER — Other Ambulatory Visit: Payer: Self-pay | Admitting: Internal Medicine

## 2022-03-22 ENCOUNTER — Ambulatory Visit (HOSPITAL_COMMUNITY): Payer: Medicaid Other | Attending: Orthopedic Surgery

## 2022-03-22 ENCOUNTER — Ambulatory Visit: Payer: Medicaid Other | Admitting: Internal Medicine

## 2022-03-22 DIAGNOSIS — M5442 Lumbago with sciatica, left side: Secondary | ICD-10-CM | POA: Insufficient documentation

## 2022-03-22 DIAGNOSIS — G8929 Other chronic pain: Secondary | ICD-10-CM | POA: Diagnosis present

## 2022-03-22 DIAGNOSIS — E876 Hypokalemia: Secondary | ICD-10-CM

## 2022-03-22 DIAGNOSIS — M545 Low back pain, unspecified: Secondary | ICD-10-CM | POA: Diagnosis not present

## 2022-03-22 DIAGNOSIS — W19XXXA Unspecified fall, initial encounter: Secondary | ICD-10-CM | POA: Insufficient documentation

## 2022-03-22 DIAGNOSIS — E559 Vitamin D deficiency, unspecified: Secondary | ICD-10-CM | POA: Insufficient documentation

## 2022-03-22 DIAGNOSIS — J309 Allergic rhinitis, unspecified: Secondary | ICD-10-CM

## 2022-03-22 LAB — HEMOGLOBIN A1C
Est. average glucose Bld gHb Est-mCnc: 126 mg/dL
Hgb A1c MFr Bld: 6 % — ABNORMAL HIGH (ref 4.8–5.6)

## 2022-03-22 LAB — HEPATITIS C ANTIBODY: Hep C Virus Ab: NONREACTIVE

## 2022-03-22 LAB — CMP14+EGFR
ALT: 13 IU/L (ref 0–32)
AST: 19 IU/L (ref 0–40)
Albumin/Globulin Ratio: 1.8 (ref 1.2–2.2)
Albumin: 4.8 g/dL (ref 3.8–4.9)
Alkaline Phosphatase: 73 IU/L (ref 44–121)
BUN/Creatinine Ratio: 9 (ref 9–23)
BUN: 7 mg/dL (ref 6–24)
Bilirubin Total: 1.1 mg/dL (ref 0.0–1.2)
CO2: 21 mmol/L (ref 20–29)
Calcium: 9.8 mg/dL (ref 8.7–10.2)
Chloride: 102 mmol/L (ref 96–106)
Creatinine, Ser: 0.78 mg/dL (ref 0.57–1.00)
Globulin, Total: 2.6 g/dL (ref 1.5–4.5)
Glucose: 115 mg/dL — ABNORMAL HIGH (ref 70–99)
Potassium: 3.3 mmol/L — ABNORMAL LOW (ref 3.5–5.2)
Sodium: 143 mmol/L (ref 134–144)
Total Protein: 7.4 g/dL (ref 6.0–8.5)
eGFR: 89 mL/min/{1.73_m2} (ref >59)

## 2022-03-22 LAB — CBC WITH DIFFERENTIAL/PLATELET
Basophils Absolute: 0 10*3/uL (ref 0.0–0.2)
Basos: 0 %
EOS (ABSOLUTE): 0.2 10*3/uL (ref 0.0–0.4)
Eos: 2 %
Hematocrit: 40.3 % (ref 34.0–46.6)
Hemoglobin: 14.2 g/dL (ref 11.1–15.9)
Immature Grans (Abs): 0 10*3/uL (ref 0.0–0.1)
Immature Granulocytes: 0 %
Lymphocytes Absolute: 2.8 10*3/uL (ref 0.7–3.1)
Lymphs: 43 %
MCH: 32.3 pg (ref 26.6–33.0)
MCHC: 35.2 g/dL (ref 31.5–35.7)
MCV: 92 fL (ref 79–97)
Monocytes Absolute: 0.4 10*3/uL (ref 0.1–0.9)
Monocytes: 6 %
Neutrophils Absolute: 3.2 10*3/uL (ref 1.4–7.0)
Neutrophils: 49 %
Platelets: 225 10*3/uL (ref 150–450)
RBC: 4.4 x10E6/uL (ref 3.77–5.28)
RDW: 13 % (ref 11.7–15.4)
WBC: 6.6 10*3/uL (ref 3.4–10.8)

## 2022-03-22 LAB — LIPID PANEL
Chol/HDL Ratio: 2.8 ratio (ref 0.0–4.4)
Cholesterol, Total: 193 mg/dL (ref 100–199)
HDL: 68 mg/dL (ref >39)
LDL Chol Calc (NIH): 75 mg/dL (ref 0–99)
Triglycerides: 317 mg/dL — ABNORMAL HIGH (ref 0–149)
VLDL Cholesterol Cal: 50 mg/dL — ABNORMAL HIGH (ref 5–40)

## 2022-03-22 LAB — TSH: TSH: 0.634 u[IU]/mL (ref 0.450–4.500)

## 2022-03-22 LAB — VITAMIN D 25 HYDROXY (VIT D DEFICIENCY, FRACTURES): Vit D, 25-Hydroxy: 15.1 ng/mL — ABNORMAL LOW (ref 30.0–100.0)

## 2022-03-22 MED ORDER — POTASSIUM CHLORIDE CRYS ER 10 MEQ PO TBCR: 10.0000 meq | EXTENDED_RELEASE_TABLET | Freq: Every day | ORAL | 3 refills | Status: DC

## 2022-03-22 MED ORDER — LEVOCETIRIZINE DIHYDROCHLORIDE 5 MG PO TABS: 5.0000 mg | ORAL_TABLET | Freq: Every evening | ORAL | 5 refills | Status: DC

## 2022-03-22 MED ORDER — VITAMIN D (ERGOCALCIFEROL) 1.25 MG (50000 UNIT) PO CAPS: 50000.0000 [IU] | ORAL_CAPSULE | ORAL | 1 refills | Status: DC

## 2022-03-22 NOTE — Assessment & Plan Note (Addendum)
BP Readings from Last 1 Encounters:  03/21/22 118/82   Well-controlled with HCTZ Counseled for compliance with the medications Advised DASH diet and moderate exercise/walking as tolerated

## 2022-03-22 NOTE — Progress Notes (Signed)
Established Patient Office Visit  Subjective:  Patient ID: WYOLENE WEIMANN, female    DOB: 1966/05/06  Age: 56 y.o. MRN: [id number]  CC:  Chief Complaint  Patient presents with   Follow-up    Follow up right shoulder pain fell 03-18-22 and fell on shoulder     HPI Judy Cross is a 56 y.o. female with past medical history of HTN, asthma, seizure disorder, lumbar spinal stenosis, tobacco abuse and OA of knee who presents for f/u of her chronic medical conditions.  Fall: She reports a fall 3 days ago, when she felt weak on her leg and her legs gave out.  She fell on the right side and has been having mild pain and swelling around the scapular area on the right side.  There is no visible bruising, but mild swelling in the area.  She has history of chronic low back pain, followed by orthopedic surgeon.  She has history of lumbar spinal stenosis and takes Norco as needed for it.  She has chronic weakness of the LE from it.  She also complains of left knee pain.  She has received steroid injections and follows up with Dr. Luna Glasgow.  Seizure disorder: She denies any episode of shaking or seizures recently.  She was placed on Keppra in the last visit as she was having episodes of seizures, but has not been able to seen neurologist yet.  MDD: She complains of anhedonia, and lack of interest in her routine activities, insomnia, lack of concentration, crying spells and agitation.  Denies SI or HI currently.    Past Medical History:  Diagnosis Date   Allergy    Anxiety 06/12/2019   Arthritis    Asthma in adult, mild intermittent, uncomplicated 9/89/2119   Breast nodule 10/12/2015   Breast nodule 10/12/2015   Bulge of lumbar disc without myelopathy    Endometrial polyp 04/28/2013   Enlarged uterus 03/26/2013   Fibroids 04/28/2013   Hot flashes 03/26/2013   Hot flashes 03/26/2013   Hypertension    Irregular bleeding 03/26/2013   Neuromuscular disorder ([place])    sciatica   Seasonal allergies     Seizures ([place])    last seizure was 2 years ago; unknown etiology. On Keppra.   Trichomoniasis 10/20/2020   tx 10/20/20  POC___    Past Surgical History:  Procedure Laterality Date   ABLATION     with polyp removal.   BREAST BIOPSY Left    neg   CESAREAN SECTION     x 3   COLONOSCOPY WITH PROPOFOL N/A 01/11/2016   Surgeon: Danie Binder, MD; mild diverticulosis in proximal ascending colon, nonbleeding internal hemorrhoids.  Repeat in 10 years.   EXAMINATION UNDER ANESTHESIA  02/28/2012   Procedure: EXAM UNDER ANESTHESIA;  Surgeon: Donato Heinz, MD;  Location: [facility];  Service: General;  Laterality: N/A;   EXCISION MASS UPPER EXTREMETIES Left 11/13/2019   Procedure: EXCISION MASS UPPER EXTREMETIES ring finger left;  Surgeon: Carole Civil, MD;  Location: [facility];  Service: Orthopedics;  Laterality: Left;   GANGLION CYST EXCISION Left    HYSTEROSCOPY WITH D & C N/A 05/23/2013   Procedure: DILATATION AND CURETTAGE /HYSTEROSCOPY;  Surgeon: Jonnie Kind, MD;  Location: [facility];  Service: Gynecology;  Laterality: N/A;   POLYPECTOMY N/A 05/23/2013   Procedure: ENDOMETRIAL POLYP REMOVAL;  Surgeon: Jonnie Kind, MD;  Location: [facility];  Service: Gynecology;  Laterality: N/A;   SPHINCTEROTOMY  02/28/2012   Procedure: SPHINCTEROTOMY;  Surgeon: Donato Heinz, MD;  Location: [facility];  Service: General;  Laterality: N/A;  Lateral Internal Sphincterotomy   TRIGGER FINGER RELEASE Left    ring finger   TUBAL LIGATION      Family History  Problem Relation Age of Onset   Hypertension Mother    Arthritis Mother    COPD Mother    Diabetes Mother    Heart disease Mother    Hypertension Father    Parkinson's disease Father    Colon cancer Neg Hx     Social History   Socioeconomic History   Marital status: Single    Spouse name: Not on file   Number of children: 3   Years of education: 14   Highest education level: Not on file  Occupational History   Occupation: home  health aid  Tobacco Use   Smoking status: Some Days    Packs/day: 0.25    Years: 20.00    Total pack years: 5.00    Types: Cigarettes   Smokeless tobacco: Never   Tobacco comments:    smokes 1 cig daily  Vaping Use   Vaping Use: Never used  Substance and Sexual Activity   Alcohol use: Yes    Comment: occasional   Drug use: No   Sexual activity: Yes    Birth control/protection: Surgical    Comment: tubal and ablation  Other Topics Concern   Not on file  Social History Narrative   Degree in child care   Currently is a home health aid   Lives at home with youngest daughter Judy Cross senior this year    Son 24-Jalon  in college for sociology    Daughter [place]      Enjoys: write, reading, cooking, drawing      Diet: eats all food groups   Caffeine: pepsi weekly    Water: 6-8 cups    Juice some times      Wears seat belt    Smoke detectors    Does not use phone with driving.   Social Determinants of Health   Financial Resource Strain: High Risk (07/20/2021)   Overall Financial Resource Strain (CARDIA)    Difficulty of Paying Living Expenses: Hard  Food Insecurity: No Food Insecurity (07/20/2021)   Hunger Vital Sign    Worried About Running Out of Food in the Last Year: Never true    Ran Out of Food in the Last Year: Never true  Transportation Needs: No Transportation Needs (07/20/2021)   PRAPARE - Hydrologist (Medical): No    Lack of Transportation (Non-Medical): No  Physical Activity: Sufficiently Active (10/12/2020)   Exercise Vital Sign    Days of Exercise per Week: 5 days    Minutes of Exercise per Session: 40 min  Stress: Stress Concern Present (08/26/2021)   [place]    Feeling of Stress : Very much  Social Connections: Moderately Isolated (10/12/2020)   Social Connection and Isolation Panel [NHANES]    Frequency of Communication with Friends  and Family: Three times a week    Frequency of Social Gatherings with Friends and Family: Once a week    Attends Religious Services: More than 4 times per year    Active Member of Clubs or Organizations: No    Attends Archivist Meetings: Never    Marital Status: Never married  Intimate Partner Violence: Not At Risk (10/12/2020)  Humiliation, Afraid, Rape, and Kick questionnaire    Fear of Current or Ex-Partner: No    Emotionally Abused: No    Physically Abused: No    Sexually Abused: No    Outpatient Medications Prior to Visit  Medication Sig Dispense Refill   albuterol (PROAIR HFA) 108 (90 Base) MCG/ACT inhaler INHALE 1-2 PUFFS BY MOUTH EVERY 6 HOURS AS NEEDED FOR WHEEZE OR SHORTNESS OF BREATHMNT 8.5 each 0   cyclobenzaprine (FLEXERIL) 10 MG tablet Take 1 tablet (10 mg total) by mouth 2 (two) times daily as needed for muscle spasms. 20 tablet 0   hydrochlorothiazide (HYDRODIURIL) 25 MG tablet Take 1 tablet (25 mg total) by mouth daily. 90 tablet 1   ibuprofen (ADVIL) 600 MG tablet Take 1 tablet (600 mg total) by mouth every 6 (six) hours as needed. 30 tablet 0   naproxen (NAPROSYN) 500 MG tablet Take 1 tablet (500 mg total) by mouth 2 (two) times daily with a meal. 60 tablet 5   omeprazole (PRILOSEC) 20 MG capsule Take 1 capsule (20 mg total) by mouth daily. 14 capsule 0   HYDROcodone-acetaminophen (NORCO/VICODIN) 5-325 MG tablet One tablet every six hours for pain.  Limit 5 days. 11 tablet 0   levETIRAcetam (KEPPRA) 500 MG tablet Take 1 tablet (500 mg total) by mouth 2 (two) times daily. 60 tablet 2   levocetirizine (XYZAL) 5 MG tablet Take 1 tablet (5 mg total) by mouth every evening. 30 tablet 5   No facility-administered medications prior to visit.    Allergies  Allergen Reactions   Lisinopril Swelling    swelling to entire mouth.   Penicillins Rash    Has patient had a PCN reaction causing immediate rash, facial/tongue/throat swelling, SOB or lightheadedness with  hypotension: no - next day Has patient had a PCN reaction causing severe rash involving mucus membranes or skin necrosis: No Has patient had a PCN reaction that required hospitalization No Has patient had a PCN reaction occurring within the last 10 years: Yes If all of the above answers are "NO", then may proceed with Cephalosporin use.    Tramadol Other (See Comments)    dizziness    ROS Review of Systems  Constitutional:  Positive for fatigue. Negative for chills and fever.  HENT:  Negative for postnasal drip, sinus pressure, sinus pain, sore throat and trouble swallowing.   Eyes:  Negative for pain and discharge.  Respiratory:  Negative for cough and shortness of breath.   Cardiovascular:  Negative for chest pain and palpitations.  Gastrointestinal:  Negative for diarrhea, nausea and vomiting.  Genitourinary:  Negative for dysuria and hematuria.  Musculoskeletal:  Positive for arthralgias, back pain and neck pain.  Skin:  Negative for rash.  Neurological:  Positive for seizures and syncope. Negative for weakness and numbness.      Objective:    Physical Exam Vitals reviewed.  Constitutional:      General: She is not in acute distress.    Appearance: She is not diaphoretic.  HENT:     Head: Normocephalic and atraumatic.     Nose: Nose normal.     Mouth/Throat:     Mouth: Mucous membranes are moist.  Eyes:     General: No scleral icterus.    Extraocular Movements: Extraocular movements intact.  Cardiovascular:     Rate and Rhythm: Normal rate and regular rhythm.     Pulses: Normal pulses.     Heart sounds: Normal heart sounds. No murmur heard. Pulmonary:  Breath sounds: Normal breath sounds. No wheezing or rales.  Musculoskeletal:        General: Tenderness (Left knee, with minimal swelling) present.     Cervical back: Neck supple. No tenderness. Decreased range of motion.     Lumbar back: Tenderness present. Decreased range of motion.     Right lower leg: No  edema.     Left lower leg: No edema.  Skin:    General: Skin is warm.     Findings: No rash.  Neurological:     General: No focal deficit present.     Mental Status: She is alert and oriented to person, place, and time.     Sensory: No sensory deficit.     Motor: Weakness (B/l LE - 3/5) present.  Psychiatric:        Mood and Affect: Mood normal.        Behavior: Behavior normal.     BP 118/82 (BP Location: Right Arm, Patient Position: Sitting, Cuff Size: Normal)   Pulse 72   Resp 18   Ht 5' 1.5" (1.562 m)   Wt 127 lb 9.6 oz (57.9 kg)   SpO2 97%   BMI 23.72 kg/m  Wt Readings from Last 3 Encounters:  03/21/22 127 lb 9.6 oz (57.9 kg)  03/10/22 125 lb (56.7 kg)  03/07/22 125 lb (56.7 kg)    Lab Results  Component Value Date   TSH 0.634 03/21/2022   Lab Results  Component Value Date   WBC 6.6 03/21/2022   HGB 14.2 03/21/2022   HCT 40.3 03/21/2022   MCV 92 03/21/2022   PLT 225 03/21/2022   Lab Results  Component Value Date   NA 143 03/21/2022   K 3.3 (L) 03/21/2022   CO2 21 03/21/2022   GLUCOSE 115 (H) 03/21/2022   BUN 7 03/21/2022   CREATININE 0.78 03/21/2022   BILITOT 1.1 03/21/2022   ALKPHOS 73 03/21/2022   AST 19 03/21/2022   ALT 13 03/21/2022   PROT 7.4 03/21/2022   ALBUMIN 4.8 03/21/2022   CALCIUM 9.8 03/21/2022   ANIONGAP 11 11/17/2021   EGFR 89 03/21/2022   Lab Results  Component Value Date   CHOL 193 03/21/2022   Lab Results  Component Value Date   HDL 68 03/21/2022   Lab Results  Component Value Date   LDLCALC 75 03/21/2022   Lab Results  Component Value Date   TRIG 317 (H) 03/21/2022   Lab Results  Component Value Date   CHOLHDL 2.8 03/21/2022   Lab Results  Component Value Date   HGBA1C 6.0 (H) 03/21/2022      Assessment & Plan:   Problem List Items Addressed This Visit       Cardiovascular and Mediastinum   Essential hypertension    BP Readings from Last 1 Encounters:  03/21/22 118/82  Well-controlled with  HCTZ Counseled for compliance with the medications Advised DASH diet and moderate exercise/walking as tolerated         Nervous and Auditory   Seizure disorder ([place]) - Primary    Her episodes of syncope likely due to seizures On Keppra Referred to Neurology Advised to go to ER if she has any episode of syncope or seizure      Relevant Medications   levETIRAcetam (KEPPRA) 500 MG tablet   Other Relevant Orders   Ambulatory referral to Neurology     Other   Spinal stenosis of lumbar region    MRI lumbar spine in the past showed  spinal stenosis and DDD of lumbar spine Her knee and leg pain could be radicular pain from spinal stenosis Followed by Orthopedic surgery On Norco as needed for severe pain      Current moderate episode of major depressive disorder without prior episode ([place])    [place] Visit from 03/21/2022 in [facility]  PHQ-9 Total Score 23  New onset Started Zoloft 25 mg daily Her other chronic medical conditions also contributing to her symptoms      Relevant Medications   sertraline (ZOLOFT) 25 MG tablet   Fall    Recent fall, likely mechanical in the setting of leg weakness from lumbar spinal stenosis and OA of knee Has mild swelling over scapular area, likely from direct impact, but no concern for acute fracture over shoulder area Advised to apply heating pad Tylenol for mild-to-moderate pain      Other Visit Diagnoses     Need for immunization against influenza       Relevant Orders   Flu Vaccine QUAD 13moIM (Fluarix, Fluzone & Alfiuria Quad PF) (Completed)       Meds ordered this encounter  Medications   sertraline (ZOLOFT) 25 MG tablet    Sig: Take 1 tablet (25 mg total) by mouth daily.    Dispense:  30 tablet    Refill:  3   levETIRAcetam (KEPPRA) 500 MG tablet    Sig: Take 1 tablet (500 mg total) by mouth 2 (two) times daily.    Dispense:  60 tablet    Refill:  2    Follow-up: Return in about 4 months  (around 07/22/2022) for Annual physical.    RLindell Spar MD

## 2022-03-22 NOTE — Assessment & Plan Note (Signed)
Her episodes of syncope likely due to seizures On Keppra Referred to Neurology Advised to go to ER if she has any episode of syncope or seizure

## 2022-03-22 NOTE — Therapy (Addendum)
OUTPATIENT PHYSICAL THERAPY THORACOLUMBAR EVALUATION   Patient Name: LORENNA LURRY MRN: [id number] DOB:[birth year], 56 y.o., female Today's Date: 03/22/2022   PT End of Session - 03/22/22 1038     Visit Number 1    Number of Visits 8    Date for PT Re-Evaluation 04/19/22    Authorization Type [place] Medicaid [employer]; please check auth    PT Start Time 1038    PT Stop Time 1115    PT Time Calculation (min) 37 min             Past Medical History:  Diagnosis Date   Allergy    Anxiety 06/12/2019   Arthritis    Asthma in adult, mild intermittent, uncomplicated 3/81/0175   Breast nodule 10/12/2015   Breast nodule 10/12/2015   Bulge of lumbar disc without myelopathy    Endometrial polyp 04/28/2013   Enlarged uterus 03/26/2013   Fibroids 04/28/2013   Hot flashes 03/26/2013   Hot flashes 03/26/2013   Hypertension    Irregular bleeding 03/26/2013   Neuromuscular disorder ([place])    sciatica   Seasonal allergies    Seizures ([place])    last seizure was 2 years ago; unknown etiology. On Keppra.   Trichomoniasis 10/20/2020   tx 10/20/20  POC___   Past Surgical History:  Procedure Laterality Date   ABLATION     with polyp removal.   BREAST BIOPSY Left    neg   CESAREAN SECTION     x 3   COLONOSCOPY WITH PROPOFOL N/A 01/11/2016   Surgeon: Danie Binder, MD; mild diverticulosis in proximal ascending colon, nonbleeding internal hemorrhoids.  Repeat in 10 years.   EXAMINATION UNDER ANESTHESIA  02/28/2012   Procedure: EXAM UNDER ANESTHESIA;  Surgeon: Donato Heinz, MD;  Location: [facility];  Service: General;  Laterality: N/A;   EXCISION MASS UPPER EXTREMETIES Left 11/13/2019   Procedure: EXCISION MASS UPPER EXTREMETIES ring finger left;  Surgeon: Carole Civil, MD;  Location: [facility];  Service: Orthopedics;  Laterality: Left;   GANGLION CYST EXCISION Left    HYSTEROSCOPY WITH D & C N/A 05/23/2013   Procedure: DILATATION AND CURETTAGE /HYSTEROSCOPY;  Surgeon: Jonnie Kind,  MD;  Location: [facility];  Service: Gynecology;  Laterality: N/A;   POLYPECTOMY N/A 05/23/2013   Procedure: ENDOMETRIAL POLYP REMOVAL;  Surgeon: Jonnie Kind, MD;  Location: [facility];  Service: Gynecology;  Laterality: N/A;   SPHINCTEROTOMY  02/28/2012   Procedure: SPHINCTEROTOMY;  Surgeon: Donato Heinz, MD;  Location: [facility];  Service: General;  Laterality: N/A;  Lateral Internal Sphincterotomy   TRIGGER FINGER RELEASE Left    ring finger   TUBAL LIGATION     Patient Active Problem List   Diagnosis Date Noted   Hypokalemia 03/22/2022   Vitamin D deficiency 03/22/2022   Fall 03/22/2022   Current moderate episode of major depressive disorder without prior episode ([place]) 03/21/2022   Tobacco abuse 01/06/2022   Spinal stenosis of lumbar region 06/17/2021   Mass of lower outer quadrant of left breast 05/09/2021   Arthritis of left knee 12/02/2020   Chronic left shoulder pain 12/02/2020   Allergic sinusitis 12/02/2020   Mass of left finger    Anxiety 06/12/2019   Seizure disorder ([place]) 06/12/2019   Environmental and seasonal allergies 06/12/2019   Abnormal Pap smear of cervix 05/27/2019   Chronic midline low back pain with left-sided sciatica 01/12/2017   Essential hypertension 12/07/2016   Mild intermittent asthma without complication 04/05/8526  PCP:   Lindell Spar, MD    REFERRING PROVIDER: Carole Civil, MD   REFERRING DIAG: [phone] (ICD-10-CM) - Chronic midline low back pain with left-sided sciatica   Rationale for Evaluation and Treatment Rehabilitation  THERAPY DIAG:  Low back pain, unspecified back pain laterality, unspecified chronicity, unspecified whether sciatica present  Chronic left-sided low back pain with left-sided sciatica  ONSET DATE: 12 years  SUBJECTIVE:                                                                                                                                                                                            SUBJECTIVE STATEMENT: Ongoing pain for many years; worsening.  Pain in back and down the left leg; shooting pain down to toes.  Sometimes left foot goes numb; currently not able to work; has been out of work 9 months.  PERTINENT HISTORY:  Cyst in left knee; seizures, anxiety and depression, HBP, herniated disc in back  PAIN:  Are you having pain? Yes: NPRS scale: 6/10 Pain location: low back, left leg, right shoulder Pain description: aching and sore Aggravating factors: prolonged sitting, prolonged standing Relieving factors: icy hot, hydrocodone   PRECAUTIONS: None  WEIGHT BEARING RESTRICTIONS No  FALLS:  Has patient fallen in last 6 months? Yes. Number of falls multiple falls due to seizure  LIVING ENVIRONMENT: Lives with: lives with their son Lives in: House/apartment Stairs: Yes: External: 1 steps; none Has following equipment at home: None  OCCUPATION: not currently working  PLOF: Independent  PATIENT GOALS decreased pain   OBJECTIVE:   DIAGNOSTIC FINDINGS:  Clinical:  left knee pain referred from lower back, no trauma   X-rays were done of the lumbar spine, five views.   Normal lumbar lordosis is present.  Disc spaces are well maintained except at L5-S1 with slight degenerative changes.  No fracture is noted.  Bone quality is good.   Impression:  mild narrowing at L5-S1, no acute findings of lumbar spine.   Electronically Signed [name], MD 9/26/20238:17 AM    PATIENT SURVEYS:  FOTO 26  COGNITION:  Overall cognitive status: Within functional limits for tasks assessed     SENSATION: Left foot burning sometimes  MUSCLE LENGTH: Hamstrings: Right ~60 deg; Left ~30 deg   PALPATION: Noted muscle spasm right periscapular area today  LUMBAR ROM:   Active  AROM  eval  Flexion 70% available Finger tip to mid shin  Extension 30% availalble *  Right lateral flexion   Left lateral flexion   Right rotation   Left rotation    (Blank rows =  not tested)  LOWER  EXTREMITY MMT:    MMT Right eval Left eval  Hip flexion 4- 3-  Hip extension    Hip abduction    Hip adduction    Hip internal rotation    Hip external rotation    Knee flexion    Knee extension 4 3+  Ankle dorsiflexion 4+ 3+  Ankle plantarflexion    Ankle inversion    Ankle eversion     (Blank rows = not tested)  LUMBAR SPECIAL TESTS:  Straight leg raise test: Positive left side  FUNCTIONAL TESTS:  5 times sit to stand: 37.04 sec using arms to assist up to standing     TODAY'S TREATMENT  Physical therapy evaluation and HEP instruction   PATIENT EDUCATION:  Education details: Patient educated on exam findings, POC, scope of PT, HEP, and . Person educated: Patient Education method: Consulting civil engineer, Media planner, and Handouts Education comprehension: verbalized understanding, returned demonstration, verbal cues required, and tactile cues required  HOME EXERCISE PROGRAM: Access Code: [email or web address] URL: [email or web address] Date: 03/22/2022 Prepared by: [facility]  Exercises - Sit to Stand with Counter Support  - 1 x daily - 7 x weekly - 3 sets - 10 reps - Supine Lower Trunk Rotation  - 1 x daily - 7 x weekly - 3 sets - 10 reps - Hooklying Single Knee to Chest Stretch  - 1 x daily - 7 x weekly - 1 sets - 3 reps - 10" hold  ASSESSMENT:  CLINICAL IMPRESSION: Patient is a 56 y.o. female who was seen today for physical therapy evaluation and treatment for M54.42,G89.29 (ICD-10-CM) - Chronic midline low back pain with left-sided sciatica .    OBJECTIVE IMPAIRMENTS Abnormal gait, decreased activity tolerance, decreased endurance, decreased mobility, difficulty walking, decreased ROM, decreased strength, hypomobility, increased fascial restrictions, impaired perceived functional ability, increased muscle spasms, impaired flexibility, and pain.   ACTIVITY LIMITATIONS carrying, lifting, bending, sitting, standing, squatting, sleeping, stairs,  transfers, bed mobility, bathing, toileting, dressing, hygiene/grooming, locomotion level, and caring for others  PARTICIPATION LIMITATIONS: meal prep, cleaning, laundry, driving, shopping, community activity, occupation, and yard work   [employer] POTENTIAL: Good  CLINICAL DECISION MAKING: Stable/uncomplicated  EVALUATION COMPLEXITY: Low   GOALS: Goals reviewed with patient? No  SHORT TERM GOALS: Target date: 04/05/2022  patient will be independent with initial HEP  Baseline: Goal status: INITIAL  2.  Patient will improve 5 x STS score from 37.04 sec to 33 sec to demonstrate improved functional mobility and increased lower extremity strength.  Baseline:  Goal status: INITIAL  3.  Patient will report at least 50% improvement in overall symptoms and/or function to demonstrate improved functional mobility  Baseline:  Goal status: INITIAL    LONG TERM GOALS: Target date: 04/19/2022  Patient will be independent in self management strategies to improve quality of life and functional outcomes.  Baseline:  Goal status: INITIAL  2.  Patient will report at least 75% improvement in overall symptoms and/or function to demonstrate improved functional mobility  Baseline:  Goal status: INITIAL  3.  Patient will improve FOTO score to predicted value to demonstrate improved functional mobility  Baseline: 26 Goal status: INITIAL  4.  Patient will improve 5 x STS score from 37.04 sec to 30 sec to demonstrate improved functional mobility and increased lower extremity strength.  Baseline:  Goal status: INITIAL  5.   Patient will increase bilateral lower extremity tested MMTs to 4/5 to promote return to ambulation community distances with minimal deviation. Baseline: see above Goal status: INITIAL  PLAN: PT FREQUENCY: 2x/week  PT DURATION: 4 weeks  PLANNED INTERVENTIONS: Therapeutic exercises, Therapeutic activity, Neuromuscular re-education, Balance training, Gait training,  Patient/Family education, Joint manipulation, Joint mobilization, Stair training, Orthotic/Fit training, DME instructions, Aquatic Therapy, Dry Needling, Electrical stimulation, Spinal manipulation, Spinal mobilization, Cryotherapy, Moist heat, Compression bandaging, scar mobilization, Splintting, Taping, Traction, Ultrasound, Ionotophoresis '4mg'$ /ml Dexamethasone, and Manual therapy   PLAN FOR NEXT SESSION: Review set rehab goals and HEP   12:08 PM, 03/22/22 Grae Cannata Small Haya Hemler MPT [facility] [place] [phone] RN:[phone]

## 2022-03-22 NOTE — Assessment & Plan Note (Signed)
[place] Office Visit from 03/21/2022 in [facility]  PHQ-9 Total Score 23     New onset Started Zoloft 25 mg daily Her other chronic medical conditions also contributing to her symptoms

## 2022-03-22 NOTE — Assessment & Plan Note (Signed)
Recent fall, likely mechanical in the setting of leg weakness from lumbar spinal stenosis and OA of knee Has mild swelling over scapular area, likely from direct impact, but no concern for acute fracture over shoulder area Advised to apply heating pad Tylenol for mild-to-moderate pain

## 2022-03-22 NOTE — Assessment & Plan Note (Signed)
MRI lumbar spine in the past showed spinal stenosis and DDD of lumbar spine Her knee and leg pain could be radicular pain from spinal stenosis Followed by Orthopedic surgery On [place] as needed for severe pain

## 2022-03-28 ENCOUNTER — Ambulatory Visit (INDEPENDENT_AMBULATORY_CARE_PROVIDER_SITE_OTHER): Payer: Medicaid Other | Admitting: Orthopaedic Surgery

## 2022-03-28 ENCOUNTER — Encounter: Payer: Self-pay | Admitting: Orthopaedic Surgery

## 2022-03-28 DIAGNOSIS — G8929 Other chronic pain: Secondary | ICD-10-CM

## 2022-03-28 DIAGNOSIS — M5442 Lumbago with sciatica, left side: Secondary | ICD-10-CM

## 2022-03-28 DIAGNOSIS — F17210 Nicotine dependence, cigarettes, uncomplicated: Secondary | ICD-10-CM

## 2022-03-28 DIAGNOSIS — M25562 Pain in left knee: Secondary | ICD-10-CM | POA: Diagnosis not present

## 2022-03-28 MED ORDER — HYDROCODONE-ACETAMINOPHEN 5-325 MG PO TABS: ORAL_TABLET | ORAL | 0 refills | Status: DC

## 2022-03-28 MED ORDER — METHYLPREDNISOLONE ACETATE 40 MG/ML IJ SUSP
40.0000 mg | Freq: Once | INTRAMUSCULAR | Status: AC
  Administered 2022-03-28: 40 mg via INTRA_ARTICULAR

## 2022-03-28 NOTE — Addendum Note (Signed)
Addended by: Obie Dredge A on: 03/28/2022 08:34 AM   Modules accepted: Orders

## 2022-03-28 NOTE — Patient Instructions (Signed)
If you don't have insurance or have trouble paying for your prescriptions, please look up [employer]. They have many generic medications. You can check to see if your medication is on the list before you sign up for you. You will be required to sign up with an email so that they are able to mail your prescription to you.

## 2022-03-28 NOTE — Progress Notes (Signed)
My knee is hurting more.  She has been to PT for her left knee.  It is painful.  PROCEDURE NOTE:  The patient requests injections of the left knee , verbal consent was obtained.  The left knee was prepped appropriately after time out was performed.   Sterile technique was observed and injection of 1 cc of DepoMedrol 40 mg with several cc's of plain xylocaine. Anesthesia was provided by ethyl chloride and a 20-gauge needle was used to inject the knee area. The injection was tolerated well.  A band aid dressing was applied.  The patient was advised to apply ice later today and tomorrow to the injection sight as needed.  Encounter Diagnoses  Name Primary?   Chronic pain of left knee Yes   Chronic midline low back pain with left-sided sciatica    Cigarette nicotine dependence without complication    Continue PT.  Return in one month.  I have reviewed the [place] web site prior to prescribing narcotic medicine for this patient.  Call if any problem.  Precautions discussed.  Electronically Signed [name], MD 10/17/20238:10 AM

## 2022-03-29 ENCOUNTER — Ambulatory Visit (HOSPITAL_COMMUNITY): Payer: Medicaid Other | Admitting: Physical Therapy

## 2022-03-29 ENCOUNTER — Encounter (HOSPITAL_COMMUNITY): Payer: Self-pay | Admitting: Physical Therapy

## 2022-03-29 DIAGNOSIS — M545 Low back pain, unspecified: Secondary | ICD-10-CM

## 2022-03-29 DIAGNOSIS — G8929 Other chronic pain: Secondary | ICD-10-CM

## 2022-03-29 NOTE — Therapy (Signed)
OUTPATIENT PHYSICAL THERAPY TREATMENT   Patient Name: Judy Cross MRN: [id number] DOB:1965-06-27, 56 y.o., female Today's Date: 03/29/2022   PT End of Session - 03/29/22 1009     Visit Number 2    Number of Visits 8    Date for PT Re-Evaluation 04/19/22    Authorization Type [place] Medicaid [employer]; please check auth    PT Start Time 1009    PT Stop Time 4098    PT Time Calculation (min) 38 min    Activity Tolerance Patient tolerated treatment well    Behavior During Therapy [facility] for tasks assessed/performed             Past Medical History:  Diagnosis Date   Allergy    Anxiety 06/12/2019   Arthritis    Asthma in adult, mild intermittent, uncomplicated 06/30/1476   Breast nodule 10/12/2015   Breast nodule 10/12/2015   Bulge of lumbar disc without myelopathy    Endometrial polyp 04/28/2013   Enlarged uterus 03/26/2013   Fibroids 04/28/2013   Hot flashes 03/26/2013   Hot flashes 03/26/2013   Hypertension    Irregular bleeding 03/26/2013   Neuromuscular disorder ([place])    sciatica   Seasonal allergies    Seizures ([place])    last seizure was 2 years ago; unknown etiology. On Keppra.   Trichomoniasis 10/20/2020   tx 10/20/20  POC___   Past Surgical History:  Procedure Laterality Date   ABLATION     with polyp removal.   BREAST BIOPSY Left    neg   CESAREAN SECTION     x 3   COLONOSCOPY WITH PROPOFOL N/A 01/11/2016   Surgeon: Danie Binder, MD; mild diverticulosis in proximal ascending colon, nonbleeding internal hemorrhoids.  Repeat in 10 years.   EXAMINATION UNDER ANESTHESIA  02/28/2012   Procedure: EXAM UNDER ANESTHESIA;  Surgeon: Donato Heinz, MD;  Location: [facility];  Service: General;  Laterality: N/A;   EXCISION MASS UPPER EXTREMETIES Left 11/13/2019   Procedure: EXCISION MASS UPPER EXTREMETIES ring finger left;  Surgeon: Carole Civil, MD;  Location: [facility];  Service: Orthopedics;  Laterality: Left;   GANGLION CYST EXCISION Left    HYSTEROSCOPY WITH  D & C N/A 05/23/2013   Procedure: DILATATION AND CURETTAGE /HYSTEROSCOPY;  Surgeon: Jonnie Kind, MD;  Location: [facility];  Service: Gynecology;  Laterality: N/A;   POLYPECTOMY N/A 05/23/2013   Procedure: ENDOMETRIAL POLYP REMOVAL;  Surgeon: Jonnie Kind, MD;  Location: [facility];  Service: Gynecology;  Laterality: N/A;   SPHINCTEROTOMY  02/28/2012   Procedure: SPHINCTEROTOMY;  Surgeon: Donato Heinz, MD;  Location: [facility];  Service: General;  Laterality: N/A;  Lateral Internal Sphincterotomy   TRIGGER FINGER RELEASE Left    ring finger   TUBAL LIGATION     Patient Active Problem List   Diagnosis Date Noted   Hypokalemia 03/22/2022   Vitamin D deficiency 03/22/2022   Fall 03/22/2022   Current moderate episode of major depressive disorder without prior episode ([place]) 03/21/2022   Tobacco abuse 01/06/2022   Spinal stenosis of lumbar region 06/17/2021   Mass of lower outer quadrant of left breast 05/09/2021   Arthritis of left knee 12/02/2020   Chronic left shoulder pain 12/02/2020   Allergic sinusitis 12/02/2020   Mass of left finger    Anxiety 06/12/2019   Seizure disorder ([place]) 06/12/2019   Environmental and seasonal allergies 06/12/2019   Abnormal Pap smear of cervix 05/27/2019   Chronic midline low back pain with  left-sided sciatica 01/12/2017   Essential hypertension 12/07/2016   Mild intermittent asthma without complication 96/78/9381    PCP:   Lindell Spar, MD    REFERRING PROVIDER: Carole Civil, MD   REFERRING DIAG: [phone] (ICD-10-CM) - Chronic midline low back pain with left-sided sciatica   Rationale for Evaluation and Treatment Rehabilitation  THERAPY DIAG:  Low back pain, unspecified back pain laterality, unspecified chronicity, unspecified whether sciatica present  Chronic left-sided low back pain with left-sided sciatica  ONSET DATE: 12 years  SUBJECTIVE:                                                                                                                                                                                            SUBJECTIVE STATEMENT: She had an injection in her knee yesterday. States symptoms in R glute.   PERTINENT HISTORY:  Cyst in left knee; seizures, anxiety and depression, HBP, herniated disc in back  PAIN:  Are you having pain? Yes: NPRS scale: 8/10 Pain location: low back, left leg, right shoulder Pain description: aching and sore Aggravating factors: prolonged sitting, prolonged standing Relieving factors: icy hot, hydrocodone   PRECAUTIONS: None  WEIGHT BEARING RESTRICTIONS No  FALLS:  Has patient fallen in last 6 months? Yes. Number of falls multiple falls due to seizure  LIVING ENVIRONMENT: Lives with: lives with their son Lives in: House/apartment Stairs: Yes: External: 1 steps; none Has following equipment at home: None  OCCUPATION: not currently working  PLOF: Independent  PATIENT GOALS decreased pain   OBJECTIVE:   DIAGNOSTIC FINDINGS:  Clinical:  left knee pain referred from lower back, no trauma   X-rays were done of the lumbar spine, five views.   Normal lumbar lordosis is present.  Disc spaces are well maintained except at L5-S1 with slight degenerative changes.  No fracture is noted.  Bone quality is good.   Impression:  mild narrowing at L5-S1, no acute findings of lumbar spine.   Electronically Signed [name], MD 9/26/20238:17 AM    PATIENT SURVEYS:  FOTO 26  COGNITION:  Overall cognitive status: Within functional limits for tasks assessed     SENSATION: Left foot burning sometimes  MUSCLE LENGTH: Hamstrings: Right ~60 deg; Left ~30 deg   PALPATION: Noted muscle spasm right periscapular area today  LUMBAR ROM:   Active  AROM  eval  Flexion 70% available Finger tip to mid shin  Extension 30% availalble *  Right lateral flexion   Left lateral flexion   Right rotation   Left rotation    (Blank rows = not tested)  LOWER  EXTREMITY MMT:  MMT Right eval Left eval  Hip flexion 4- 3-  Hip extension    Hip abduction    Hip adduction    Hip internal rotation    Hip external rotation    Knee flexion    Knee extension 4 3+  Ankle dorsiflexion 4+ 3+  Ankle plantarflexion    Ankle inversion    Ankle eversion     (Blank rows = not tested)  LUMBAR SPECIAL TESTS:  Straight leg raise test: Positive left side  FUNCTIONAL TESTS:  5 times sit to stand: 37.04 sec using arms to assist up to standing     TODAY'S TREATMENT  03/29/22 Supine LTR 10x 10 second holds bilateral  SKTC 5x 20 second holds bilateral  Supine ab set 10 x 5 second holds Supine ab set with march 2 x 10 bilateral  Bridge 2 x 10  Sidelying clam 2x 10 bilateral  POE 2 x 30 second holds Press up 2 x 10  Prone hip extension 2 x 10 alternating bilateral  STS 2 x 10    PATIENT EDUCATION:  Education details: 03/29/22: HEP; EVAL:Patient educated on exam findings, POC, scope of PT, HEP, and . Person educated: Patient Education method: Explanation, Demonstration, and Handouts Education comprehension: verbalized understanding, returned demonstration, verbal cues required, and tactile cues required  HOME EXERCISE PROGRAM: Access Code: [email or web address] URL: [email or web address] 03/29/22 - Prone Press Up  - 3 x daily - 7 x weekly - 2 sets - 10 reps - Supine Bridge  - 1 x daily - 7 x weekly - 2 sets - 10 reps  Date: 03/22/2022 - Sit to Stand with Counter Support  - 1 x daily - 7 x weekly - 3 sets - 10 reps - Supine Lower Trunk Rotation  - 1 x daily - 7 x weekly - 3 sets - 10 reps - Hooklying Single Knee to Chest Stretch  - 1 x daily - 7 x weekly - 1 sets - 3 reps - 10" hold  ASSESSMENT:  CLINICAL IMPRESSION: Began session with previously completed exercises and patient completes with good mechanics following initial cueing. Added core and hip strengthening which is tolerated well. Patient stating improvement in symptoms with  press up. Patient with dynamic knee valgus bilaterally R>L with STS which improves moderately with cueing for mechanics. Patient only with c/o L knee symptoms at end of session which has been bothering her since injection yesterday. Patient will continue to benefit from physical therapy in order to improve function and reduce impairment.    OBJECTIVE IMPAIRMENTS Abnormal gait, decreased activity tolerance, decreased endurance, decreased mobility, difficulty walking, decreased ROM, decreased strength, hypomobility, increased fascial restrictions, impaired perceived functional ability, increased muscle spasms, impaired flexibility, and pain.   ACTIVITY LIMITATIONS carrying, lifting, bending, sitting, standing, squatting, sleeping, stairs, transfers, bed mobility, bathing, toileting, dressing, hygiene/grooming, locomotion level, and caring for others  PARTICIPATION LIMITATIONS: meal prep, cleaning, laundry, driving, shopping, community activity, occupation, and yard work   [employer] POTENTIAL: Good  CLINICAL DECISION MAKING: Stable/uncomplicated  EVALUATION COMPLEXITY: Low   GOALS: Goals reviewed with patient? No  SHORT TERM GOALS: Target date: 04/05/2022  patient will be independent with initial HEP  Baseline: Goal status: INITIAL  2.  Patient will improve 5 x STS score from 37.04 sec to 33 sec to demonstrate improved functional mobility and increased lower extremity strength.  Baseline:  Goal status: INITIAL  3.  Patient will report at least 50% improvement in overall symptoms and/or function to demonstrate improved functional mobility  Baseline:  Goal status: INITIAL    LONG TERM GOALS: Target date: 04/19/2022  Patient will be independent in self management strategies to improve quality of life and functional outcomes.  Baseline:  Goal status: INITIAL  2.  Patient will report at least 75% improvement in overall symptoms and/or function to demonstrate improved functional  mobility  Baseline:  Goal status: INITIAL  3.  Patient will improve FOTO score to predicted value to demonstrate improved functional mobility  Baseline: 26 Goal status: INITIAL  4.  Patient will improve 5 x STS score from 37.04 sec to 30 sec to demonstrate improved functional mobility and increased lower extremity strength.  Baseline:  Goal status: INITIAL  5.   Patient will increase bilateral lower extremity tested MMTs to 4/5 to promote return to ambulation community distances with minimal deviation. Baseline: see above Goal status: INITIAL  PLAN: PT FREQUENCY: 2x/week  PT DURATION: 4 weeks  PLANNED INTERVENTIONS: Therapeutic exercises, Therapeutic activity, Neuromuscular re-education, Balance training, Gait training, Patient/Family education, Joint manipulation, Joint mobilization, Stair training, Orthotic/Fit training, DME instructions, Aquatic Therapy, Dry Needling, Electrical stimulation, Spinal manipulation, Spinal mobilization, Cryotherapy, Moist heat, Compression bandaging, scar mobilization, Splintting, Taping, Traction, Ultrasound, Ionotophoresis '4mg'$ /ml Dexamethasone, and Manual therapy   PLAN FOR NEXT SESSION: core strength, hip strength, lumbar and hip mobility   10:27 AM, 03/29/22 Mearl Latin PT, DPT Physical Therapist at [facility]

## 2022-03-30 ENCOUNTER — Ambulatory Visit: Payer: Medicaid Other | Admitting: Orthopaedic Surgery

## 2022-04-04 ENCOUNTER — Telehealth: Payer: Self-pay | Admitting: Orthopaedic Surgery

## 2022-04-04 MED ORDER — HYDROCODONE-ACETAMINOPHEN 5-325 MG PO TABS: ORAL_TABLET | ORAL | 0 refills | Status: DC

## 2022-04-04 NOTE — Telephone Encounter (Signed)
Patient called for refill  HYDROcodone-acetaminophen (NORCO/VICODIN) 5-325 MG tablet 11 tablet       [place], [place]

## 2022-04-05 ENCOUNTER — Telehealth (HOSPITAL_COMMUNITY): Payer: Self-pay

## 2022-04-05 ENCOUNTER — Encounter (HOSPITAL_COMMUNITY): Payer: Self-pay

## 2022-04-05 ENCOUNTER — Ambulatory Visit (HOSPITAL_COMMUNITY): Payer: Medicaid Other

## 2022-04-05 NOTE — Telephone Encounter (Signed)
[place] this morning.  Reports she has been having seizures, fell and hit her head last week, going to neurologist today.  Encouraged pt to call and cancel if unable to make it to apt[facility] in the future.  Ihor Austin, LPTA/CLT; Delana Meyer [phone]

## 2022-04-07 ENCOUNTER — Ambulatory Visit (HOSPITAL_COMMUNITY): Payer: Medicaid Other

## 2022-04-07 ENCOUNTER — Encounter (HOSPITAL_COMMUNITY): Payer: Self-pay

## 2022-04-07 DIAGNOSIS — G8929 Other chronic pain: Secondary | ICD-10-CM

## 2022-04-07 DIAGNOSIS — M545 Low back pain, unspecified: Secondary | ICD-10-CM | POA: Diagnosis not present

## 2022-04-07 NOTE — Therapy (Signed)
OUTPATIENT PHYSICAL THERAPY TREATMENT   Patient Name: Judy Cross MRN: [id number] DOB:07/23/1965, 56 y.o., female Today's Date: 04/07/2022   PT End of Session - 04/07/22 0721     Visit Number 3    Number of Visits 8    Date for PT Re-Evaluation 04/19/22    Authorization Type [place] Medicaid [employer]    Authorization Time Period no auth required for pt over the age of 54. Visit limit 27 combined PT/OT/ST    PT Start Time 0721    PT Stop Time 0806    PT Time Calculation (min) 45 min    Activity Tolerance Patient tolerated treatment well    Behavior During Therapy [facility] for tasks assessed/performed             Past Medical History:  Diagnosis Date   Allergy    Anxiety 06/12/2019   Arthritis    Asthma in adult, mild intermittent, uncomplicated 1/49/7026   Breast nodule 10/12/2015   Breast nodule 10/12/2015   Bulge of lumbar disc without myelopathy    Endometrial polyp 04/28/2013   Enlarged uterus 03/26/2013   Fibroids 04/28/2013   Hot flashes 03/26/2013   Hot flashes 03/26/2013   Hypertension    Irregular bleeding 03/26/2013   Neuromuscular disorder ([place])    sciatica   Seasonal allergies    Seizures ([place])    last seizure was 2 years ago; unknown etiology. On Keppra.   Trichomoniasis 10/20/2020   tx 10/20/20  POC___   Past Surgical History:  Procedure Laterality Date   ABLATION     with polyp removal.   BREAST BIOPSY Left    neg   CESAREAN SECTION     x 3   COLONOSCOPY WITH PROPOFOL N/A 01/11/2016   Surgeon: Danie Binder, MD; mild diverticulosis in proximal ascending colon, nonbleeding internal hemorrhoids.  Repeat in 10 years.   EXAMINATION UNDER ANESTHESIA  02/28/2012   Procedure: EXAM UNDER ANESTHESIA;  Surgeon: Donato Heinz, MD;  Location: [facility];  Service: General;  Laterality: N/A;   EXCISION MASS UPPER EXTREMETIES Left 11/13/2019   Procedure: EXCISION MASS UPPER EXTREMETIES ring finger left;  Surgeon: Carole Civil, MD;  Location: [facility];   Service: Orthopedics;  Laterality: Left;   GANGLION CYST EXCISION Left    HYSTEROSCOPY WITH D & C N/A 05/23/2013   Procedure: DILATATION AND CURETTAGE /HYSTEROSCOPY;  Surgeon: Jonnie Kind, MD;  Location: [facility];  Service: Gynecology;  Laterality: N/A;   POLYPECTOMY N/A 05/23/2013   Procedure: ENDOMETRIAL POLYP REMOVAL;  Surgeon: Jonnie Kind, MD;  Location: [facility];  Service: Gynecology;  Laterality: N/A;   SPHINCTEROTOMY  02/28/2012   Procedure: SPHINCTEROTOMY;  Surgeon: Donato Heinz, MD;  Location: [facility];  Service: General;  Laterality: N/A;  Lateral Internal Sphincterotomy   TRIGGER FINGER RELEASE Left    ring finger   TUBAL LIGATION     Patient Active Problem List   Diagnosis Date Noted   Hypokalemia 03/22/2022   Vitamin D deficiency 03/22/2022   Fall 03/22/2022   Current moderate episode of major depressive disorder without prior episode ([place]) 03/21/2022   Tobacco abuse 01/06/2022   Spinal stenosis of lumbar region 06/17/2021   Mass of lower outer quadrant of left breast 05/09/2021   Arthritis of left knee 12/02/2020   Chronic left shoulder pain 12/02/2020   Allergic sinusitis 12/02/2020   Mass of left finger    Anxiety 06/12/2019   Seizure disorder ([place]) 06/12/2019   Environmental and seasonal  allergies 06/12/2019   Abnormal Pap smear of cervix 05/27/2019   Chronic midline low back pain with left-sided sciatica 01/12/2017   Essential hypertension 12/07/2016   Mild intermittent asthma without complication 84/13/2440    PCP:   Lindell Spar, MD    REFERRING PROVIDER: Carole Civil, MD  Neurologist apt 04/17/22  REFERRING DIAG: [phone] (ICD-10-CM) - Chronic midline low back pain with left-sided sciatica   Rationale for Evaluation and Treatment Rehabilitation  THERAPY DIAG:  Low back pain, unspecified back pain laterality, unspecified chronicity, unspecified whether sciatica present  Chronic left-sided low back pain with left-sided  sciatica  ONSET DATE: 12 years  SUBJECTIVE:                                                                                                                                                                                           SUBJECTIVE STATEMENT: Pt stated she is feeling good today, no reports of pain today.  Reports she went to podiatrist earlier this week to have toe nail adjusted but unable to get treated due to insurance.  Reports she has neurologist apt on 04/17/22 to address current seizures she had 2 weeks ago and fell and hit her head.    PERTINENT HISTORY:  Cyst in left knee; seizures, anxiety and depression, HBP, herniated disc in back  PAIN:  Are you having pain? Yes: NPRS scale: 8/10 Pain location: low back, left leg, right shoulder Pain description: aching and sore Aggravating factors: prolonged sitting, prolonged standing Relieving factors: icy hot, hydrocodone   PRECAUTIONS: None  WEIGHT BEARING RESTRICTIONS No  FALLS:  Has patient fallen in last 6 months? Yes. Number of falls multiple falls due to seizure  LIVING ENVIRONMENT: Lives with: lives with their son Lives in: House/apartment Stairs: Yes: External: 1 steps; none Has following equipment at home: None  OCCUPATION: not currently working  PLOF: Independent  PATIENT GOALS decreased pain   OBJECTIVE:   DIAGNOSTIC FINDINGS:  Clinical:  left knee pain referred from lower back, no trauma   X-rays were done of the lumbar spine, five views.   Normal lumbar lordosis is present.  Disc spaces are well maintained except at L5-S1 with slight degenerative changes.  No fracture is noted.  Bone quality is good.   Impression:  mild narrowing at L5-S1, no acute findings of lumbar spine.   Electronically Signed [name], MD 9/26/20238:17 AM    PATIENT SURVEYS:  FOTO 26  COGNITION:  Overall cognitive status: Within functional limits for tasks assessed     SENSATION: Left foot burning  sometimes  MUSCLE LENGTH: Hamstrings: Right ~60 deg; Left ~30 deg  PALPATION: Noted muscle spasm right periscapular area today  LUMBAR ROM:   Active  AROM  eval  Flexion 70% available Finger tip to mid shin  Extension 30% availalble *  Right lateral flexion   Left lateral flexion   Right rotation   Left rotation    (Blank rows = not tested)  LOWER EXTREMITY MMT:    MMT Right eval Left eval  Hip flexion 4- 3-  Hip extension    Hip abduction    Hip adduction    Hip internal rotation    Hip external rotation    Knee flexion    Knee extension 4 3+  Ankle dorsiflexion 4+ 3+  Ankle plantarflexion    Ankle inversion    Ankle eversion     (Blank rows = not tested)  LUMBAR SPECIAL TESTS:  Straight leg raise test: Positive left side  FUNCTIONAL TESTS:  5 times sit to stand: 37.04 sec using arms to assist up to standing     TODAY'S TREATMENT  04/07/22 Supine:  LTR 5x 10" Bridge 10x 5" Prone: press up 10 Hip extension 10x Sidelying: Clam with RTB 10x 5"  Abduction 10x STS controlled eccentric control 10x Standing: Heel raises 10x Squats 10x   03/29/22 Supine LTR 10x 10 second holds bilateral  SKTC 5x 20 second holds bilateral  Supine ab set 10 x 5 second holds Supine ab set with march 2 x 10 bilateral  Bridge 2 x 10  Sidelying clam 2x 10 bilateral  POE 2 x 30 second holds Press up 2 x 10  Prone hip extension 2 x 10 alternating bilateral  STS 2 x 10    PATIENT EDUCATION:  Education details: 03/29/22: HEP; EVAL:Patient educated on exam findings, POC, scope of PT, HEP, and . Person educated: Patient Education method: Explanation, Demonstration, and Handouts Education comprehension: verbalized understanding, returned demonstration, verbal cues required, and tactile cues required  HOME EXERCISE PROGRAM: Access Code: [email or web address] URL: [email or web address] 04/07/22: Squat and heel raise  03/29/22 - Prone Press Up  - 3 x daily - 7 x  weekly - 2 sets - 10 reps - Supine Bridge  - 1 x daily - 7 x weekly - 2 sets - 10 reps  Date: 03/22/2022 - Sit to Stand with Counter Support  - 1 x daily - 7 x weekly - 3 sets - 10 reps - Supine Lower Trunk Rotation  - 1 x daily - 7 x weekly - 3 sets - 10 reps - Hooklying Single Knee to Chest Stretch  - 1 x daily - 7 x weekly - 1 sets - 3 reps - 10" hold  ASSESSMENT:  CLINICAL IMPRESSION: Reviewed goals and discussed current progress and HEP compliance, pt able to recall all exercises and complete.  Progressed core and hip strengthening with additional exercises.  Pt friendly, eager and motivated to participate through session.  Added squats and heel raises to HEP with handout given and verbalized understanding.  No reports of pain through session.  OBJECTIVE IMPAIRMENTS Abnormal gait, decreased activity tolerance, decreased endurance, decreased mobility, difficulty walking, decreased ROM, decreased strength, hypomobility, increased fascial restrictions, impaired perceived functional ability, increased muscle spasms, impaired flexibility, and pain.   ACTIVITY LIMITATIONS carrying, lifting, bending, sitting, standing, squatting, sleeping, stairs, transfers, bed mobility, bathing, toileting, dressing, hygiene/grooming, locomotion level, and caring for others  PARTICIPATION LIMITATIONS: meal prep, cleaning, laundry, driving, shopping, community activity, occupation, and yard work   [employer] POTENTIAL: Good  CLINICAL DECISION MAKING: Stable/uncomplicated  EVALUATION COMPLEXITY: Low  GOALS: Goals reviewed with patient? No  SHORT TERM GOALS: Target date: 04/05/2022  patient will be independent with initial HEP  Baseline: Goal status: IN PROGRESS  2.  Patient will improve 5 x STS score from 37.04 sec to 33 sec to demonstrate improved functional mobility and increased lower extremity strength.  Baseline:  Goal status: IN PROGRESS  3.  Patient will report at least 50% improvement in  overall symptoms and/or function to demonstrate improved functional mobility  Baseline:  Goal status: IN PROGRESS    LONG TERM GOALS: Target date: 04/19/2022  Patient will be independent in self management strategies to improve quality of life and functional outcomes.  Baseline:  Goal status: IN PROGRESS  2.  Patient will report at least 75% improvement in overall symptoms and/or function to demonstrate improved functional mobility  Baseline:  Goal status: IN PROGRESS  3.  Patient will improve FOTO score to predicted value to demonstrate improved functional mobility  Baseline: 26 Goal status: IN PROGRESS  4.  Patient will improve 5 x STS score from 37.04 sec to 30 sec to demonstrate improved functional mobility and increased lower extremity strength.  Baseline:  Goal status: IN PROGRESS  5.   Patient will increase bilateral lower extremity tested MMTs to 4/5 to promote return to ambulation community distances with minimal deviation. Baseline: see above Goal status: IN PROGRESS  PLAN: PT FREQUENCY: 2x/week  PT DURATION: 4 weeks  PLANNED INTERVENTIONS: Therapeutic exercises, Therapeutic activity, Neuromuscular re-education, Balance training, Gait training, Patient/Family education, Joint manipulation, Joint mobilization, Stair training, Orthotic/Fit training, DME instructions, Aquatic Therapy, Dry Needling, Electrical stimulation, Spinal manipulation, Spinal mobilization, Cryotherapy, Moist heat, Compression bandaging, scar mobilization, Splintting, Taping, Traction, Ultrasound, Ionotophoresis '4mg'$ /ml Dexamethasone, and Manual therapy   PLAN FOR NEXT SESSION: core strength, hip strength, lumbar and hip mobility  Ihor Austin, LPTA/CLT; CBIS [phone]  11:03 AM, 04/07/22

## 2022-04-11 ENCOUNTER — Ambulatory Visit (HOSPITAL_COMMUNITY): Payer: Medicaid Other

## 2022-04-11 ENCOUNTER — Encounter: Payer: Medicaid Other | Admitting: Internal Medicine

## 2022-04-11 ENCOUNTER — Telehealth: Payer: Self-pay

## 2022-04-11 DIAGNOSIS — M545 Low back pain, unspecified: Secondary | ICD-10-CM

## 2022-04-11 DIAGNOSIS — G8929 Other chronic pain: Secondary | ICD-10-CM

## 2022-04-11 MED ORDER — HYDROCODONE-ACETAMINOPHEN 5-325 MG PO TABS: ORAL_TABLET | ORAL | 0 refills | Status: DC

## 2022-04-11 NOTE — Therapy (Signed)
OUTPATIENT PHYSICAL THERAPY TREATMENT   Patient Name: Judy Cross MRN: [id number] DOB:02/24/1966, 56 y.o., female Today's Date: 04/11/2022   PT End of Session - 04/11/22 0725     Visit Number 4    Number of Visits 8    Date for PT Re-Evaluation 04/19/22    Authorization Type [place] Medicaid [employer]    Authorization Time Period no auth required for pt over the age of 23. Visit limit 27 combined PT/OT/ST    PT Start Time 0724    PT Stop Time 0806    PT Time Calculation (min) 42 min    Activity Tolerance Patient tolerated treatment well    Behavior During Therapy [facility] for tasks assessed/performed              Past Medical History:  Diagnosis Date   Allergy    Anxiety 06/12/2019   Arthritis    Asthma in adult, mild intermittent, uncomplicated 9/37/1696   Breast nodule 10/12/2015   Breast nodule 10/12/2015   Bulge of lumbar disc without myelopathy    Endometrial polyp 04/28/2013   Enlarged uterus 03/26/2013   Fibroids 04/28/2013   Hot flashes 03/26/2013   Hot flashes 03/26/2013   Hypertension    Irregular bleeding 03/26/2013   Neuromuscular disorder ([place])    sciatica   Seasonal allergies    Seizures ([place])    last seizure was 2 years ago; unknown etiology. On Keppra.   Trichomoniasis 10/20/2020   tx 10/20/20  POC___   Past Surgical History:  Procedure Laterality Date   ABLATION     with polyp removal.   BREAST BIOPSY Left    neg   CESAREAN SECTION     x 3   COLONOSCOPY WITH PROPOFOL N/A 01/11/2016   Surgeon: Danie Binder, MD; mild diverticulosis in proximal ascending colon, nonbleeding internal hemorrhoids.  Repeat in 10 years.   EXAMINATION UNDER ANESTHESIA  02/28/2012   Procedure: EXAM UNDER ANESTHESIA;  Surgeon: Donato Heinz, MD;  Location: [facility];  Service: General;  Laterality: N/A;   EXCISION MASS UPPER EXTREMETIES Left 11/13/2019   Procedure: EXCISION MASS UPPER EXTREMETIES ring finger left;  Surgeon: Carole Civil, MD;  Location: [facility];   Service: Orthopedics;  Laterality: Left;   GANGLION CYST EXCISION Left    HYSTEROSCOPY WITH D & C N/A 05/23/2013   Procedure: DILATATION AND CURETTAGE /HYSTEROSCOPY;  Surgeon: Jonnie Kind, MD;  Location: [facility];  Service: Gynecology;  Laterality: N/A;   POLYPECTOMY N/A 05/23/2013   Procedure: ENDOMETRIAL POLYP REMOVAL;  Surgeon: Jonnie Kind, MD;  Location: [facility];  Service: Gynecology;  Laterality: N/A;   SPHINCTEROTOMY  02/28/2012   Procedure: SPHINCTEROTOMY;  Surgeon: Donato Heinz, MD;  Location: [facility];  Service: General;  Laterality: N/A;  Lateral Internal Sphincterotomy   TRIGGER FINGER RELEASE Left    ring finger   TUBAL LIGATION     Patient Active Problem List   Diagnosis Date Noted   Hypokalemia 03/22/2022   Vitamin D deficiency 03/22/2022   Fall 03/22/2022   Current moderate episode of major depressive disorder without prior episode ([place]) 03/21/2022   Tobacco abuse 01/06/2022   Spinal stenosis of lumbar region 06/17/2021   Mass of lower outer quadrant of left breast 05/09/2021   Arthritis of left knee 12/02/2020   Chronic left shoulder pain 12/02/2020   Allergic sinusitis 12/02/2020   Mass of left finger    Anxiety 06/12/2019   Seizure disorder ([place]) 06/12/2019   Environmental and  seasonal allergies 06/12/2019   Abnormal Pap smear of cervix 05/27/2019   Chronic midline low back pain with left-sided sciatica 01/12/2017   Essential hypertension 12/07/2016   Mild intermittent asthma without complication 62/56/3893    PCP:   Lindell Spar, MD    REFERRING PROVIDER: Carole Civil, MD  Neurologist apt 04/17/22  REFERRING DIAG: [phone] (ICD-10-CM) - Chronic midline low back pain with left-sided sciatica   Rationale for Evaluation and Treatment Rehabilitation  THERAPY DIAG:  Low back pain, unspecified back pain laterality, unspecified chronicity, unspecified whether sciatica present  Chronic left-sided low back pain with left-sided  sciatica  ONSET DATE: 12 years  SUBJECTIVE:                                                                                                                                                                                           SUBJECTIVE STATEMENT: Patient reports no pain today; sleeping good at night now.    PERTINENT HISTORY:  Cyst in left knee; seizures, anxiety and depression, HBP, herniated disc in back  PAIN:  Are you having pain? Yes: NPRS scale: 0/10 Pain location: low back, left leg, right shoulder Pain description: aching and sore Aggravating factors: prolonged sitting, prolonged standing Relieving factors: icy hot, hydrocodone   PRECAUTIONS: None  WEIGHT BEARING RESTRICTIONS No  FALLS:  Has patient fallen in last 6 months? Yes. Number of falls multiple falls due to seizure  LIVING ENVIRONMENT: Lives with: lives with their son Lives in: House/apartment Stairs: Yes: External: 1 steps; none Has following equipment at home: None  OCCUPATION: not currently working  PLOF: Independent  PATIENT GOALS decreased pain   OBJECTIVE:   DIAGNOSTIC FINDINGS:  Clinical:  left knee pain referred from lower back, no trauma   X-rays were done of the lumbar spine, five views.   Normal lumbar lordosis is present.  Disc spaces are well maintained except at L5-S1 with slight degenerative changes.  No fracture is noted.  Bone quality is good.   Impression:  mild narrowing at L5-S1, no acute findings of lumbar spine.   Electronically Signed [name], MD 9/26/20238:17 AM    PATIENT SURVEYS:  FOTO 26  COGNITION:  Overall cognitive status: Within functional limits for tasks assessed     SENSATION: Left foot burning sometimes  MUSCLE LENGTH: Hamstrings: Right ~60 deg; Left ~30 deg   PALPATION: Noted muscle spasm right periscapular area today  LUMBAR ROM:   Active  AROM  eval  Flexion 70% available Finger tip to mid shin  Extension 30% availalble *   Right lateral flexion   Left lateral flexion   Right  rotation   Left rotation    (Blank rows = not tested)  LOWER EXTREMITY MMT:    MMT Right eval Left eval  Hip flexion 4- 3-  Hip extension    Hip abduction    Hip adduction    Hip internal rotation    Hip external rotation    Knee flexion    Knee extension 4 3+  Ankle dorsiflexion 4+ 3+  Ankle plantarflexion    Ankle inversion    Ankle eversion     (Blank rows = not tested)  LUMBAR SPECIAL TESTS:  Straight leg raise test: Positive left side  FUNCTIONAL TESTS:  5 times sit to stand: 37.04 sec using arms to assist up to standing     TODAY'S TREATMENT  04/11/22 Supine: LTR 10 x 5" hold Bridge 5" hold x 10 Bridge with ball for hip adduction x 10 Bridge with belt for hip abduction x 10  Prone: Prone press ups x 10 Hip extension 2 x 10  Standing: Heel raises x 20 Hip vectors x 5 each leg Slant board 5 x 20" RTB scapular retractions 2 x 10  5 x sit to stand 16 sec no UE assist; (STG and LTG met)     04/07/22 Supine:  LTR 5x 10" Bridge 10x 5" Prone: press up 10 Hip extension 10x Sidelying: Clam with RTB 10x 5"  Abduction 10x STS controlled eccentric control 10x Standing: Heel raises 10x Squats 10x   03/29/22 Supine LTR 10x 10 second holds bilateral  SKTC 5x 20 second holds bilateral  Supine ab set 10 x 5 second holds Supine ab set with march 2 x 10 bilateral  Bridge 2 x 10  Sidelying clam 2x 10 bilateral  POE 2 x 30 second holds Press up 2 x 10  Prone hip extension 2 x 10 alternating bilateral  STS 2 x 10    PATIENT EDUCATION:  Education details: 03/29/22: HEP; EVAL:Patient educated on exam findings, POC, scope of PT, HEP, and . Person educated: Patient Education method: Explanation, Demonstration, and Handouts Education comprehension: verbalized understanding, returned demonstration, verbal cues required, and tactile cues required  HOME EXERCISE PROGRAM: Access Code:  [email or web address] URL: [email or web address] 04/11/22 scapular retractions and hip vectors 04/07/22: Squat and heel raise  03/29/22 - Prone Press Up  - 3 x daily - 7 x weekly - 2 sets - 10 reps - Supine Bridge  - 1 x daily - 7 x weekly - 2 sets - 10 reps  Date: 03/22/2022 - Sit to Stand with Counter Support  - 1 x daily - 7 x weekly - 3 sets - 10 reps - Supine Lower Trunk Rotation  - 1 x daily - 7 x weekly - 3 sets - 10 reps - Hooklying Single Knee to Chest Stretch  - 1 x daily - 7 x weekly - 1 sets - 3 reps - 10" hold  ASSESSMENT:  CLINICAL IMPRESSION: Today's session focused on lumbar mobility and core strengthening.  Progressed bridge exercise without issue. Progressed standing exercise today without issue.  Met STG and LTG for 5 times sit to stand demonstrating improved functional mobility and increased lower extremity strength.  Added hip vectors; patient needs cues to avoid trunk substitution.  Updated HEP. Patient will benefit from continued skilled therapy services to address deficits and promote return to optimal function.      OBJECTIVE IMPAIRMENTS Abnormal gait, decreased activity tolerance, decreased endurance, decreased mobility, difficulty walking, decreased ROM, decreased strength, hypomobility, increased fascial restrictions, impaired perceived  functional ability, increased muscle spasms, impaired flexibility, and pain.   ACTIVITY LIMITATIONS carrying, lifting, bending, sitting, standing, squatting, sleeping, stairs, transfers, bed mobility, bathing, toileting, dressing, hygiene/grooming, locomotion level, and caring for others  PARTICIPATION LIMITATIONS: meal prep, cleaning, laundry, driving, shopping, community activity, occupation, and yard work   [employer] POTENTIAL: Good  CLINICAL DECISION MAKING: Stable/uncomplicated  EVALUATION COMPLEXITY: Low   GOALS: Goals reviewed with patient? No  SHORT TERM GOALS: Target date: 04/05/2022  patient will be independent  with initial HEP  Baseline: Goal status: IN PROGRESS  2.  Patient will improve 5 x STS score from 37.04 sec to 33 sec to demonstrate improved functional mobility and increased lower extremity strength.  Baseline: 04/11/22 16 sec Goal status: MET  3.  Patient will report at least 50% improvement in overall symptoms and/or function to demonstrate improved functional mobility  Baseline:  Goal status: IN PROGRESS    LONG TERM GOALS: Target date: 04/19/2022  Patient will be independent in self management strategies to improve quality of life and functional outcomes.  Baseline:  Goal status: IN PROGRESS  2.  Patient will report at least 75% improvement in overall symptoms and/or function to demonstrate improved functional mobility  Baseline:  Goal status: IN PROGRESS  3.  Patient will improve FOTO score to predicted value to demonstrate improved functional mobility  Baseline: 26 Goal status: IN PROGRESS  4.  Patient will improve 5 x STS score from 37.04 sec to 30 sec to demonstrate improved functional mobility and increased lower extremity strength.  Baseline: 04/11/22 16 sec Goal status: MET  5.   Patient will increase bilateral lower extremity tested MMTs to 4/5 to promote return to ambulation community distances with minimal deviation. Baseline: see above Goal status: IN PROGRESS  PLAN: PT FREQUENCY: 2x/week  PT DURATION: 4 weeks  PLANNED INTERVENTIONS: Therapeutic exercises, Therapeutic activity, Neuromuscular re-education, Balance training, Gait training, Patient/Family education, Joint manipulation, Joint mobilization, Stair training, Orthotic/Fit training, DME instructions, Aquatic Therapy, Dry Needling, Electrical stimulation, Spinal manipulation, Spinal mobilization, Cryotherapy, Moist heat, Compression bandaging, scar mobilization, Splintting, Taping, Traction, Ultrasound, Ionotophoresis 32m/ml Dexamethasone, and Manual therapy   PLAN FOR NEXT SESSION: core  strength, hip strength, lumbar and hip mobility  8:07 AM, 04/11/22 Shanae Luo Small Kiamesha Samet MPT [facility] [place] #[phone]:[phone]

## 2022-04-11 NOTE — Telephone Encounter (Signed)
Hydrocodone-Acetaminophen  5/325 MG  Qty 11 Tablets  PATIENT USES [facility] ON [address]

## 2022-04-12 ENCOUNTER — Other Ambulatory Visit: Payer: Self-pay | Admitting: Internal Medicine

## 2022-04-12 DIAGNOSIS — G40909 Epilepsy, unspecified, not intractable, without status epilepticus: Secondary | ICD-10-CM

## 2022-04-13 ENCOUNTER — Encounter (HOSPITAL_COMMUNITY): Payer: Medicaid Other | Admitting: Physical Therapy

## 2022-04-15 ENCOUNTER — Emergency Department (HOSPITAL_COMMUNITY)
Admission: EM | Admit: 2022-04-15 | Discharge: 2022-04-15 | Disposition: A | Discharge status: Home / Self Care | Payer: Medicaid Other | Attending: Emergency Medicine | Admitting: Emergency Medicine

## 2022-04-15 ENCOUNTER — Emergency Department (HOSPITAL_COMMUNITY): Payer: Medicaid Other

## 2022-04-15 ENCOUNTER — Encounter (HOSPITAL_COMMUNITY): Payer: Self-pay

## 2022-04-15 DIAGNOSIS — R42 Dizziness and giddiness: Secondary | ICD-10-CM | POA: Insufficient documentation

## 2022-04-15 DIAGNOSIS — E876 Hypokalemia: Secondary | ICD-10-CM | POA: Insufficient documentation

## 2022-04-15 DIAGNOSIS — J45909 Unspecified asthma, uncomplicated: Secondary | ICD-10-CM | POA: Diagnosis not present

## 2022-04-15 DIAGNOSIS — M542 Cervicalgia: Secondary | ICD-10-CM | POA: Diagnosis not present

## 2022-04-15 DIAGNOSIS — R001 Bradycardia, unspecified: Secondary | ICD-10-CM | POA: Diagnosis not present

## 2022-04-15 DIAGNOSIS — R519 Headache, unspecified: Secondary | ICD-10-CM | POA: Diagnosis not present

## 2022-04-15 DIAGNOSIS — I10 Essential (primary) hypertension: Secondary | ICD-10-CM | POA: Insufficient documentation

## 2022-04-15 DIAGNOSIS — M791 Myalgia, unspecified site: Secondary | ICD-10-CM | POA: Insufficient documentation

## 2022-04-15 LAB — CBC WITH DIFFERENTIAL/PLATELET
Abs Immature Granulocytes: 0.01 10*3/uL (ref 0.00–0.07)
Basophils Absolute: 0 10*3/uL (ref 0.0–0.1)
Basophils Relative: 1 %
Eosinophils Absolute: 0.2 10*3/uL (ref 0.0–0.5)
Eosinophils Relative: 3 %
HCT: 37.8 % (ref 36.0–46.0)
Hemoglobin: 12.9 g/dL (ref 12.0–15.0)
Immature Granulocytes: 0 %
Lymphocytes Relative: 49 %
Lymphs Abs: 2.9 10*3/uL (ref 0.7–4.0)
MCH: 32.7 pg (ref 26.0–34.0)
MCHC: 34.1 g/dL (ref 30.0–36.0)
MCV: 95.7 fL (ref 80.0–100.0)
Monocytes Absolute: 0.4 10*3/uL (ref 0.1–1.0)
Monocytes Relative: 6 %
Neutro Abs: 2.4 10*3/uL (ref 1.7–7.7)
Neutrophils Relative %: 41 %
Platelets: 229 10*3/uL (ref 150–400)
RBC: 3.95 MIL/uL (ref 3.87–5.11)
RDW: 14 % (ref 11.5–15.5)
WBC: 5.9 10*3/uL (ref 4.0–10.5)
nRBC: 0 % (ref 0.0–0.2)

## 2022-04-15 LAB — BASIC METABOLIC PANEL
Anion gap: 8 (ref 5–15)
BUN: 13 mg/dL (ref 6–20)
CO2: 25 mmol/L (ref 22–32)
Calcium: 9.1 mg/dL (ref 8.9–10.3)
Chloride: 108 mmol/L (ref 98–111)
Creatinine, Ser: 0.8 mg/dL (ref 0.44–1.00)
GFR, Estimated: >60 mL/min (ref >60)
Glucose, Bld: 112 mg/dL — ABNORMAL HIGH (ref 70–99)
Potassium: 3.1 mmol/L — ABNORMAL LOW (ref 3.5–5.1)
Sodium: 141 mmol/L (ref 135–145)

## 2022-04-15 MED ORDER — METOCLOPRAMIDE HCL 5 MG/ML IJ SOLN
10.0000 mg | Freq: Once | INTRAMUSCULAR | Status: AC
  Administered 2022-04-15: 10 mg via INTRAVENOUS
  Filled 2022-04-15: qty 2

## 2022-04-15 MED ORDER — POTASSIUM CHLORIDE CRYS ER 20 MEQ PO TBCR
40.0000 meq | EXTENDED_RELEASE_TABLET | Freq: Once | ORAL | Status: AC
  Administered 2022-04-15: 40 meq via ORAL
  Filled 2022-04-15: qty 2

## 2022-04-15 MED ORDER — KETOROLAC TROMETHAMINE 30 MG/ML IJ SOLN
30.0000 mg | Freq: Once | INTRAMUSCULAR | Status: AC
  Administered 2022-04-15: 30 mg via INTRAVENOUS
  Filled 2022-04-15: qty 1

## 2022-04-15 MED ORDER — DIPHENHYDRAMINE HCL 50 MG/ML IJ SOLN
12.5000 mg | Freq: Once | INTRAMUSCULAR | Status: AC
  Administered 2022-04-15: 12.5 mg via INTRAVENOUS
  Filled 2022-04-15: qty 1

## 2022-04-15 NOTE — ED Triage Notes (Signed)
Pt c/o headache on right side of head since last night. Reports increase in falls, last fall 2 weeks ago.

## 2022-04-15 NOTE — Discharge Instructions (Signed)
Your work-up today was reassuring.  Your potassium level is still low.  Please try to get your potassium prescription filled when possible.  I have also listed some potassium rich foods for you.  You will need to have your potassium level rechecked in 1 to 2 weeks.  Your primary care provider can arrange this for you, please call to schedule a follow-up appointment.

## 2022-04-15 NOTE — ED Provider Notes (Signed)
[facility] EMERGENCY DEPARTMENT Provider Note   CSN: [id number] Arrival date & time: 04/15/22  0946     History {Add pertinent medical, surgical, social history, OB history to HPI:1} Chief Complaint  Patient presents with   Headache    Judy Cross is a 56 y.o. female.   Headache Associated symptoms: dizziness, myalgias, neck pain and weakness   Associated symptoms: no abdominal pain, no back pain, no diarrhea, no eye pain, no fever, no nausea, no neck stiffness, no numbness, no photophobia, no seizures and no vomiting        Judy Cross is a 56 y.o. female with past medical history significant for seizures, asthma, hypertension, persistent left knee pain and low back pain who presents to the Emergency Department complaining of gradual onset of right-sided headache since yesterday.  She describes the headache as a throbbing pain along her right forehead, temple, and neck.  Radiates into her right shoulder as well.  Pain somewhat improved after taking Benadryl last evening.  She woke this morning and "my head felt crazy" which prompted her ER visit this morning.  She also endorses frequent falls and intermittent dizziness.  States she had a dizzy episode 2 weeks ago and felt as though she was about to pass out she sat down and eased herself to the floor.  She denies traumatic fall.  She has history of seizures, but denies any recent seizure activity or missed doses of her seizure medication.  She complains of some weakness to her right side, she ambulated in the department with steady gait.  Home Medications Prior to Admission medications   Medication Sig Start Date End Date Taking? Authorizing Provider  albuterol (PROAIR HFA) 108 (90 Base) MCG/ACT inhaler INHALE 1-2 PUFFS BY MOUTH EVERY 6 HOURS AS NEEDED FOR WHEEZE OR SHORTNESS OF BREATHMNT 03/10/22   [name], FNP  cyclobenzaprine (FLEXERIL) 10 MG tablet Take 1 tablet (10 mg total) by mouth 2 (two) times daily as needed  for muscle spasms. 12/02/20   Lindell Spar, MD  hydrochlorothiazide (HYDRODIURIL) 25 MG tablet Take 1 tablet (25 mg total) by mouth daily. 01/06/22   Lindell Spar, MD  HYDROcodone-acetaminophen (NORCO/VICODIN) 5-325 MG tablet One tablet every six hours for pain.  Limit 5 days. 04/11/22   [name], MD  ibuprofen (ADVIL) 600 MG tablet Take 1 tablet (600 mg total) by mouth every 6 (six) hours as needed. 02/08/22   Myna Bright M, PA-C  levETIRAcetam (KEPPRA) 500 MG tablet TAKE 1 TABLET(500 MG) BY MOUTH TWICE DAILY 04/12/22   Lindell Spar, MD  levocetirizine (XYZAL) 5 MG tablet Take 1 tablet (5 mg total) by mouth every evening. 03/22/22   Lindell Spar, MD  naproxen (NAPROSYN) 500 MG tablet Take 1 tablet (500 mg total) by mouth 2 (two) times daily with a meal. 10/11/21   [name], MD  omeprazole (PRILOSEC) 20 MG capsule Take 1 capsule (20 mg total) by mouth daily. 11/17/21   [name], MD  potassium chloride (KLOR-CON M) 10 MEQ tablet Take 1 tablet (10 mEq total) by mouth daily. 03/22/22   Lindell Spar, MD  sertraline (ZOLOFT) 25 MG tablet Take 1 tablet (25 mg total) by mouth daily. 03/21/22   Lindell Spar, MD  Vitamin D, Ergocalciferol, (DRISDOL) 1.25 MG (50000 UNIT) CAPS capsule Take 1 capsule (50,000 Units total) by mouth every 7 (seven) days. 03/22/22   Lindell Spar, MD      Allergies  Lisinopril, Penicillins, and Tramadol    Review of Systems   Review of Systems  Constitutional:  Negative for appetite change, chills and fever.  Eyes:  Negative for photophobia, pain and visual disturbance.  Respiratory:  Negative for shortness of breath.   Cardiovascular:  Negative for chest pain.  Gastrointestinal:  Negative for abdominal pain, diarrhea, nausea and vomiting.  Genitourinary:  Negative for dysuria.  Musculoskeletal:  Positive for myalgias and neck pain. Negative for arthralgias, back pain and neck stiffness.  Neurological:  Positive for dizziness, weakness  and headaches. Negative for seizures, syncope, speech difficulty and numbness.  Psychiatric/Behavioral:  Negative for confusion.     Physical Exam Updated Vital Signs BP (!) 143/96 (BP Location: Left Arm)   Temp 97.8 F (36.6 C) (Oral)   Resp 18   Ht '5\' 1"'$  (1.549 m)   Wt 57.6 kg   SpO2 100%   BMI 24.00 kg/m  Physical Exam Vitals and nursing note reviewed.  Constitutional:      General: She is not in acute distress.    Appearance: Normal appearance. She is well-developed. She is not ill-appearing or toxic-appearing.  HENT:     Head: Atraumatic.     Mouth/Throat:     Mouth: Mucous membranes are moist.  Eyes:     Extraocular Movements: Extraocular movements intact.     Conjunctiva/sclera: Conjunctivae normal.     Pupils: Pupils are equal, round, and reactive to light.  Cardiovascular:     Rate and Rhythm: Normal rate and regular rhythm.     Pulses: Normal pulses.  Pulmonary:     Effort: Pulmonary effort is normal.     Breath sounds: Normal breath sounds.  Abdominal:     General: There is no distension.     Palpations: Abdomen is soft.     Tenderness: There is no abdominal tenderness.  Musculoskeletal:        General: Normal range of motion.     Right lower leg: No edema.     Left lower leg: No edema.  Skin:    General: Skin is warm.     Capillary Refill: Capillary refill takes less than 2 seconds.     Findings: No rash.  Neurological:     Mental Status: She is alert and oriented to person, place, and time.     GCS: GCS eye subscore is 4. GCS verbal subscore is 5. GCS motor subscore is 6.     Sensory: Sensation is intact. No sensory deficit.     Motor: Motor function is intact. No weakness.     Coordination: Coordination is intact.     Gait: Gait is intact. Gait normal.     Comments: CN II through XII grossly intact.  Speech clear.  No pronator drift.  No facial droop.  4 out of 5 motor strength of the bilateral upper and lower extremities.     ED Results /  Procedures / Treatments   Labs (all labs ordered are listed, but only abnormal results are displayed) Labs Reviewed  CBC WITH DIFFERENTIAL/PLATELET  BASIC METABOLIC PANEL    EKG None  Radiology No results found.  Procedures Procedures  {Document cardiac monitor, telemetry assessment procedure when appropriate:1}  Medications Ordered in ED Medications  ketorolac (TORADOL) 30 MG/ML injection 30 mg (has no administration in time range)  diphenhydrAMINE (BENADRYL) injection 12.5 mg (has no administration in time range)  metoCLOPramide (REGLAN) injection 10 mg (has no administration in time range)    ED Course/ Medical  Decision Making/ A&P                           Medical Decision Making Patient here for evaluation of right-sided headache gradual onset yesterday.  Pain temporarily improved after taking Benadryl.  Woke this morning and headache had returned.  She describes throbbing pounding sensation of the right side of her head and neck.  Pain radiates to the right shoulder as well.  She endorses some intermittent dizziness without recent syncope or seizure.  On exam, patient well-appearing nontoxic.  Mildly hypertensive but vital signs otherwise reassuring.  Speech is clear.  She has some right-sided weakness to grip strength testing, but no pronator drift or lower extremity weakness when distracted.  I suspect her headache is secondary to migraine, her diff would also include SAH, CVA/TIA, mengingitis, temporal arteritis  Amount and/or Complexity of Data Reviewed Labs: ordered. Radiology: ordered.  Risk Prescription drug management.     {Document critical care time when appropriate:1} {Document review of labs and clinical decision tools ie heart score, Chads2Vasc2 etc:1}  {Document your independent review of radiology images, and any outside records:1} {Document your discussion with family members, caretakers, and with consultants:1} {Document social determinants of  health affecting pt's care:1} {Document your decision making why or why not admission, treatments were needed:1} Final Clinical Impression(s) / ED Diagnoses Final diagnoses:  None    Rx / DC Orders ED Discharge Orders     None

## 2022-04-17 ENCOUNTER — Encounter: Payer: Self-pay | Admitting: Neurology

## 2022-04-17 ENCOUNTER — Ambulatory Visit: Payer: Medicaid Other | Admitting: Neurology

## 2022-04-17 VITALS — BP 111/74 | HR 74 | Ht 61.0 in | Wt 124.0 lb

## 2022-04-17 DIAGNOSIS — Z5181 Encounter for therapeutic drug level monitoring: Secondary | ICD-10-CM | POA: Diagnosis not present

## 2022-04-17 DIAGNOSIS — G40909 Epilepsy, unspecified, not intractable, without status epilepticus: Secondary | ICD-10-CM | POA: Diagnosis not present

## 2022-04-17 MED ORDER — LEVETIRACETAM 750 MG PO TABS: 750.0000 mg | ORAL_TABLET | Freq: Two times a day (BID) | ORAL | 11 refills | Status: DC

## 2022-04-17 NOTE — Patient Instructions (Addendum)
Increase Keppra to 750 mg twice daily We will check Keppra level today Routine EEG Continue other medication Follow-up in 3 months or sooner if worse

## 2022-04-17 NOTE — Progress Notes (Signed)
[facility]  PATIENT: Judy Cross DOB: 07-07-1965  REQUESTING CLINICIAN: Lindell Spar, MD HISTORY FROM: Patient  REASON FOR VISIT: Seizure   HISTORICAL  CHIEF COMPLAINT:  Chief Complaint  Patient presents with   New Patient (Initial Visit)    Rm 12 NP Internal referral for seizures Reports seizure 2 weeks ago, c/o blurry vision    HISTORY OF PRESENT ILLNESS:  This is a 56 year old woman past medical history of hypertension, hyperlipidemia, chronic back pain who is presenting with episodes of seizures described as blacking out, falling and unresponsiveness.  Patient reports these episodes started 2 years ago.  On average she will have 1 blackout spell per month.  With these episodes she has hit her head.  She reported these episodes were diagnosed as seizures and she was started on Keppra 500 mg twice daily.  She is compliant with the medication but still continued to have events.  Patient reports the episodes are more active when she is stressed out.  She has been out of work and this has been a very stressful episode for her.  She reports that her last episode was 2 weeks ago, sometime prior to an episode she will feel nauseous, will reach out for trash can or attempt to go to the bathroom and the next thing that she knows, she is waking up from the floor.   Handedness: Right handed   Onset: 2 years ago   Seizure Type: Unclear, described as being unconscious and passing out  Current frequency: Once a month   Any injuries from seizures: Head trauma   Seizure risk factors: Niece with seizure   Previous ASMs: Levetiracetam   Currenty ASMs: Levetiracetam 500 mg twice a day   ASMs side effects: None   Brain Images: Normal head Ct   Previous EEGs: None    OTHER MEDICAL CONDITIONS: Back pain, Asthma, Hypertension  REVIEW OF SYSTEMS: Full 14 system review of systems performed and negative with exception of: As noted in the HPI    ALLERGIES: Allergies  Allergen Reactions   Lisinopril Swelling    swelling to entire mouth.   Penicillins Rash    Has patient had a PCN reaction causing immediate rash, facial/tongue/throat swelling, SOB or lightheadedness with hypotension: no - next day Has patient had a PCN reaction causing severe rash involving mucus membranes or skin necrosis: No Has patient had a PCN reaction that required hospitalization No Has patient had a PCN reaction occurring within the last 10 years: Yes If all of the above answers are "NO", then may proceed with Cephalosporin use.    Tramadol Other (See Comments)    dizziness    HOME MEDICATIONS: Outpatient Medications Prior to Visit  Medication Sig Dispense Refill   albuterol (PROAIR HFA) 108 (90 Base) MCG/ACT inhaler INHALE 1-2 PUFFS BY MOUTH EVERY 6 HOURS AS NEEDED FOR WHEEZE OR SHORTNESS OF BREATHMNT 8.5 each 0   cyclobenzaprine (FLEXERIL) 10 MG tablet Take 1 tablet (10 mg total) by mouth 2 (two) times daily as needed for muscle spasms. 20 tablet 0   hydrochlorothiazide (HYDRODIURIL) 25 MG tablet Take 1 tablet (25 mg total) by mouth daily. 90 tablet 1   HYDROcodone-acetaminophen (NORCO/VICODIN) 5-325 MG tablet One tablet every six hours for pain.  Limit 5 days. 11 tablet 0   ibuprofen (ADVIL) 600 MG tablet Take 1 tablet (600 mg total) by mouth every 6 (six) hours as needed. 30 tablet 0   levocetirizine (XYZAL) 5 MG tablet Take 1  tablet (5 mg total) by mouth every evening. 30 tablet 5   naproxen (NAPROSYN) 500 MG tablet Take 1 tablet (500 mg total) by mouth 2 (two) times daily with a meal. 60 tablet 5   omeprazole (PRILOSEC) 20 MG capsule Take 1 capsule (20 mg total) by mouth daily. 14 capsule 0   potassium chloride (KLOR-CON M) 10 MEQ tablet Take 1 tablet (10 mEq total) by mouth daily. 30 tablet 3   sertraline (ZOLOFT) 25 MG tablet Take 1 tablet (25 mg total) by mouth daily. 30 tablet 3   Vitamin D, Ergocalciferol, (DRISDOL) 1.25 MG (50000 UNIT) CAPS  capsule Take 1 capsule (50,000 Units total) by mouth every 7 (seven) days. 12 capsule 1   levETIRAcetam (KEPPRA) 500 MG tablet TAKE 1 TABLET(500 MG) BY MOUTH TWICE DAILY 60 tablet 2   No facility-administered medications prior to visit.    PAST MEDICAL HISTORY: Past Medical History:  Diagnosis Date   Allergy    Anxiety 06/12/2019   Arthritis    Asthma in adult, mild intermittent, uncomplicated 6/31/4970   Breast nodule 10/12/2015   Breast nodule 10/12/2015   Bulge of lumbar disc without myelopathy    Endometrial polyp 04/28/2013   Enlarged uterus 03/26/2013   Fibroids 04/28/2013   Hot flashes 03/26/2013   Hot flashes 03/26/2013   Hypertension    Irregular bleeding 03/26/2013   Neuromuscular disorder ([place])    sciatica   Seasonal allergies    Seizures ([place])    last seizure was 2 years ago; unknown etiology. On Keppra.   Trichomoniasis 10/20/2020   tx 10/20/20  POC___    PAST SURGICAL HISTORY: Past Surgical History:  Procedure Laterality Date   ABLATION     with polyp removal.   BREAST BIOPSY Left    neg   CESAREAN SECTION     x 3   COLONOSCOPY WITH PROPOFOL N/A 01/11/2016   Surgeon: Danie Binder, MD; mild diverticulosis in proximal ascending colon, nonbleeding internal hemorrhoids.  Repeat in 10 years.   EXAMINATION UNDER ANESTHESIA  02/28/2012   Procedure: EXAM UNDER ANESTHESIA;  Surgeon: Donato Heinz, MD;  Location: [facility];  Service: General;  Laterality: N/A;   EXCISION MASS UPPER EXTREMETIES Left 11/13/2019   Procedure: EXCISION MASS UPPER EXTREMETIES ring finger left;  Surgeon: Carole Civil, MD;  Location: [facility];  Service: Orthopedics;  Laterality: Left;   GANGLION CYST EXCISION Left    HYSTEROSCOPY WITH D & C N/A 05/23/2013   Procedure: DILATATION AND CURETTAGE /HYSTEROSCOPY;  Surgeon: Jonnie Kind, MD;  Location: [facility];  Service: Gynecology;  Laterality: N/A;   POLYPECTOMY N/A 05/23/2013   Procedure: ENDOMETRIAL POLYP REMOVAL;  Surgeon: Jonnie Kind, MD;  Location: [facility];  Service: Gynecology;  Laterality: N/A;   SPHINCTEROTOMY  02/28/2012   Procedure: SPHINCTEROTOMY;  Surgeon: Donato Heinz, MD;  Location: [facility];  Service: General;  Laterality: N/A;  Lateral Internal Sphincterotomy   TRIGGER FINGER RELEASE Left    ring finger   TUBAL LIGATION      FAMILY HISTORY: Family History  Problem Relation Age of Onset   Hypertension Mother    Arthritis Mother    COPD Mother    Diabetes Mother    Heart disease Mother    Hypertension Father    Parkinson's disease Father    Colon cancer Neg Hx     SOCIAL HISTORY: Social History   Socioeconomic History   Marital status: Single    Spouse name: Not  on file   Number of children: 3   Years of education: 14   Highest education level: Not on file  Occupational History   Occupation: home health aid  Tobacco Use   Smoking status: Some Days    Packs/day: 0.25    Years: 20.00    Total pack years: 5.00    Types: Cigarettes   Smokeless tobacco: Never   Tobacco comments:    smokes 1 cig daily  Vaping Use   Vaping Use: Never used  Substance and Sexual Activity   Alcohol use: Yes    Comment: occasional   Drug use: No   Sexual activity: Yes    Birth control/protection: Surgical    Comment: tubal and ablation  Other Topics Concern   Not on file  Social History Narrative   Degree in child care   Currently is a home health aid   Lives at home with youngest daughter Jeanella Craze senior this year    Son 24-Jalon  in college for sociology    Daughter [place]      Enjoys: write, reading, cooking, drawing      Diet: eats all food groups   Caffeine: pepsi weekly    Water: 6-8 cups    Juice some times      Wears seat belt    Smoke detectors    Does not use phone with driving.   Social Determinants of Health   Financial Resource Strain: High Risk (07/20/2021)   Overall Financial Resource Strain (CARDIA)    Difficulty of Paying Living Expenses:  Hard  Food Insecurity: No Food Insecurity (07/20/2021)   Hunger Vital Sign    Worried About Running Out of Food in the Last Year: Never true    Ran Out of Food in the Last Year: Never true  Transportation Needs: No Transportation Needs (07/20/2021)   PRAPARE - Hydrologist (Medical): No    Lack of Transportation (Non-Medical): No  Physical Activity: Sufficiently Active (10/12/2020)   Exercise Vital Sign    Days of Exercise per Week: 5 days    Minutes of Exercise per Session: 40 min  Stress: Stress Concern Present (08/26/2021)   [place]    Feeling of Stress : Very much  Social Connections: Moderately Isolated (10/12/2020)   Social Connection and Isolation Panel [NHANES]    Frequency of Communication with Friends and Family: Three times a week    Frequency of Social Gatherings with Friends and Family: Once a week    Attends Religious Services: More than 4 times per year    Active Member of [employer] or Organizations: No    Attends Archivist Meetings: Never    Marital Status: Never married  Intimate Partner Violence: Not At Risk (10/12/2020)   Humiliation, Afraid, Rape, and Kick questionnaire    Fear of Current or Ex-Partner: No    Emotionally Abused: No    Physically Abused: No    Sexually Abused: No    PHYSICAL EXAM  GENERAL EXAM/CONSTITUTIONAL: Vitals:  Vitals:   04/17/22 0916  BP: 111/74  Pulse: 74  Weight: 124 lb (56.2 kg)  Height: '5\' 1"'$  (1.549 m)   Body mass index is 23.43 kg/m. Wt Readings from Last 3 Encounters:  04/17/22 124 lb (56.2 kg)  04/15/22 127 lb (57.6 kg)  03/21/22 127 lb 9.6 oz (57.9 kg)   Patient is in no distress; well developed, nourished and groomed;  neck is supple  EYES: Pupils round and reactive to light, Visual fields full to confrontation, Extraocular movements intacts,  No results found.  MUSCULOSKELETAL: Gait, strength, tone, movements noted  in Neurologic exam below  NEUROLOGIC: MENTAL STATUS:      No data to display         awake, alert, oriented to person, place and time recent and remote memory intact normal attention and concentration language fluent, comprehension intact, naming intact fund of knowledge appropriate  CRANIAL NERVE:  2nd, 3rd, 4th, 6th - pupils equal and reactive to light, visual fields full to confrontation, extraocular muscles intact, no nystagmus 5th - facial sensation symmetric 7th - facial strength symmetric 8th - hearing intact 9th - palate elevates symmetrically, uvula midline 11th - shoulder shrug symmetric 12th - tongue protrusion midline  MOTOR:  normal bulk and tone, full strength in the BUE, BLE  SENSORY:  normal and symmetric to light touch  COORDINATION:  finger-nose-finger, fine finger movements normal  REFLEXES:  deep tendon reflexes present and symmetric  GAIT/STATION:  normal   DIAGNOSTIC DATA (LABS, IMAGING, TESTING) - I reviewed patient records, labs, notes, testing and imaging myself where available.  Lab Results  Component Value Date   WBC 5.9 04/15/2022   HGB 12.9 04/15/2022   HCT 37.8 04/15/2022   MCV 95.7 04/15/2022   PLT 229 04/15/2022      Component Value Date/Time   NA 141 04/15/2022 1142   NA 143 03/21/2022 1028   K 3.1 (L) 04/15/2022 1142   CL 108 04/15/2022 1142   CO2 25 04/15/2022 1142   GLUCOSE 112 (H) 04/15/2022 1142   BUN 13 04/15/2022 1142   BUN 7 03/21/2022 1028   CREATININE 0.80 04/15/2022 1142   CREATININE 0.83 02/02/2017 0941   CALCIUM 9.1 04/15/2022 1142   PROT 7.4 03/21/2022 1028   ALBUMIN 4.8 03/21/2022 1028   AST 19 03/21/2022 1028   ALT 13 03/21/2022 1028   ALKPHOS 73 03/21/2022 1028   BILITOT 1.1 03/21/2022 1028   GFRNONAA >60 04/15/2022 1142   GFRNONAA 82 02/02/2017 0941   GFRAA >60 11/07/2019 1103   GFRAA >89 02/02/2017 0941   Lab Results  Component Value Date   CHOL 193 03/21/2022   HDL 68 03/21/2022    LDLCALC 75 03/21/2022   TRIG 317 (H) 03/21/2022   Lab Results  Component Value Date   HGBA1C 6.0 (H) 03/21/2022   No results found for: "VITAMINB12" Lab Results  Component Value Date   TSH 0.634 03/21/2022    Head CT 04/15/22 No evidence of acute intracranial abnormality   I personally reviewed brain Images.  ASSESSMENT AND PLAN  56 y.o. year old female  with history of hypertension, hyperlipidemia, chronic back pain who is presenting with episodes of blackout, unresponsiveness that were diagnosed as seizures.  She is on Keppra 500 mg twice daily but continued to have episodes.  Her last one was 2 weeks ago.  She fell and hit her head and since then has been having right-sided headache.  She went to the hospital couple days ago, her head CT was normal, no acute intracranial abnormality.  She reports that her pain is still present but low in intensity.  At this time I am going increase the Keppra to 750 mg twice daily, check a Keppra level and also obtain a routine EEG.  Advised patient to contact me if she continues to have events.  She did report these events are brought on by stress and  currently she is under a lot of stress as she is not working, Nonepileptic spells are in the differential.  I will see her in 3 months for follow-up.   1. Seizure disorder ([place])   2. Therapeutic drug monitoring     Patient Instructions  Increase Keppra to 750 mg twice daily We will check Keppra level today Routine EEG Continue other medication Follow-up in 3 months or sooner if worse   Per [facility] statutes, patients with seizures are not allowed to drive until they have been seizure-free for six months.  Other recommendations include using caution when using heavy equipment or power tools. Avoid working on ladders or at heights. Take showers instead of baths.  Do not swim alone.  Ensure the water temperature is not too high on the home water heater. Do not go swimming alone. Do not lock  yourself in a room alone (i.e. bathroom). When caring for infants or small children, sit down when holding, feeding, or changing them to minimize risk of injury to the child in the event you have a seizure. Maintain good sleep hygiene. Avoid alcohol.  Also recommend adequate sleep, hydration, good diet and minimize stress.   During the Seizure  - First, ensure adequate ventilation and place patients on the floor on their left side  Loosen clothing around the neck and ensure the airway is patent. If the patient is clenching the teeth, do not force the mouth open with any object as this can cause severe damage - Remove all items from the surrounding that can be hazardous. The patient may be oblivious to what's happening and may not even know what he or she is doing. If the patient is confused and wandering, either gently guide him/her away and block access to outside areas - Reassure the individual and be comforting - Call 911. In most cases, the seizure ends before EMS arrives. However, there are cases when seizures may last over 3 to 5 minutes. Or the individual may have developed breathing difficulties or severe injuries. If a pregnant patient or a person with diabetes develops a seizure, it is prudent to call an ambulance. - Finally, if the patient does not regain full consciousness, then call EMS. Most patients will remain confused for about 45 to 90 minutes after a seizure, so you must use judgment in calling for help. - Avoid restraints but make sure the patient is in a bed with padded side rails - Place the individual in a lateral position with the neck slightly flexed; this will help the saliva drain from the mouth and prevent the tongue from falling backward - Remove all nearby furniture and other hazards from the area - Provide verbal assurance as the individual is regaining consciousness - Provide the patient with privacy if possible - Call for help and start treatment as ordered by the  caregiver   After the Seizure (Postictal Stage)  After a seizure, most patients experience confusion, fatigue, muscle pain and/or a headache. Thus, one should permit the individual to sleep. For the next few days, reassurance is essential. Being calm and helping reorient the person is also of importance.  Most seizures are painless and end spontaneously. Seizures are not harmful to others but can lead to complications such as stress on the lungs, brain and the heart. Individuals with prior lung problems may develop labored breathing and respiratory distress.     Orders Placed This Encounter  Procedures   Levetiracetam level   EEG adult  Meds ordered this encounter  Medications   levETIRAcetam (KEPPRA) 750 MG tablet    Sig: Take 1 tablet (750 mg total) by mouth 2 (two) times daily.    Dispense:  60 tablet    Refill:  11    Return in about 3 months (around 07/18/2022).    Alric Ran, MD 04/17/2022, 9:57 AM  [facility] [address] [place],  [address] [phone]

## 2022-04-18 ENCOUNTER — Encounter (HOSPITAL_COMMUNITY): Payer: Medicaid Other | Admitting: Physical Therapy

## 2022-04-18 ENCOUNTER — Telehealth: Payer: Self-pay | Admitting: Radiology

## 2022-04-18 ENCOUNTER — Telehealth (HOSPITAL_COMMUNITY): Payer: Self-pay | Admitting: Physical Therapy

## 2022-04-18 MED ORDER — HYDROCODONE-ACETAMINOPHEN 5-325 MG PO TABS: ORAL_TABLET | ORAL | 0 refills | Status: DC

## 2022-04-18 NOTE — Telephone Encounter (Signed)
Patient requests refill pain meds.

## 2022-04-18 NOTE — Telephone Encounter (Signed)
Pt did not show for appt. Unable to reach. Pt cancelled last appt and NS today so per policy if DNS, discharge.  Teena Irani, PTA/CLT [place] Ph: [phone]

## 2022-04-20 ENCOUNTER — Ambulatory Visit (HOSPITAL_COMMUNITY): Payer: Medicaid Other | Attending: Orthopedic Surgery | Admitting: Physical Therapy

## 2022-04-20 ENCOUNTER — Telehealth: Payer: Self-pay

## 2022-04-20 DIAGNOSIS — G8929 Other chronic pain: Secondary | ICD-10-CM | POA: Diagnosis present

## 2022-04-20 DIAGNOSIS — M545 Low back pain, unspecified: Secondary | ICD-10-CM | POA: Insufficient documentation

## 2022-04-20 DIAGNOSIS — M5442 Lumbago with sciatica, left side: Secondary | ICD-10-CM | POA: Diagnosis present

## 2022-04-20 LAB — LEVETIRACETAM LEVEL: Levetiracetam Lvl: 20.1 ug/mL (ref 10.0–40.0)

## 2022-04-20 NOTE — Telephone Encounter (Signed)
Pt verified by name and DOB,  normal results given per provider, pt voiced understanding all question answered. °

## 2022-04-20 NOTE — Telephone Encounter (Signed)
-----   Message from Alric Ran, MD sent at 04/20/2022  8:35 AM EST ----- Please call and advise the patient that the recent [place] level was within normal limits. No further action is required on these tests at this time. Please remind patient to keep any upcoming appointments or tests and to call us with any interim questions, concerns, problems or updates. Thanks,   Alric Ran, MD

## 2022-04-20 NOTE — Addendum Note (Signed)
Addended byDonnal Debar, Warren Lacy S on: 04/20/2022 03:20 PM   Modules accepted: Orders

## 2022-04-20 NOTE — Therapy (Addendum)
OUTPATIENT PHYSICAL THERAPY TREATMENT Progress Note Reporting Period 03/22/22 to 04/20/22  See note below for Objective Data and Assessment of Progress/Goals.       Patient Name: Judy Cross MRN: [id number] DOB:1966-04-14, 56 y.o., female Today's Date: 04/20/2022   PT End of Session - 04/20/22 0840     Visit Number 5    Number of Visits 8    Date for PT Re-Evaluation 04/19/22    Authorization Type [place] Medicaid [employer]    Authorization Time Period no auth required for pt over the age of 32. Visit limit 27 combined PT/OT/ST    PT Start Time 0839    PT Stop Time 0918    PT Time Calculation (min) 39 min    Activity Tolerance Patient tolerated treatment well    Behavior During Therapy [facility] for tasks assessed/performed              Past Medical History:  Diagnosis Date   Allergy    Anxiety 06/12/2019   Arthritis    Asthma in adult, mild intermittent, uncomplicated 7/91/5056   Breast nodule 10/12/2015   Breast nodule 10/12/2015   Bulge of lumbar disc without myelopathy    Endometrial polyp 04/28/2013   Enlarged uterus 03/26/2013   Fibroids 04/28/2013   Hot flashes 03/26/2013   Hot flashes 03/26/2013   Hypertension    Irregular bleeding 03/26/2013   Neuromuscular disorder ([place])    sciatica   Seasonal allergies    Seizures ([place])    last seizure was 2 years ago; unknown etiology. On Keppra.   Trichomoniasis 10/20/2020   tx 10/20/20  POC___   Past Surgical History:  Procedure Laterality Date   ABLATION     with polyp removal.   BREAST BIOPSY Left    neg   CESAREAN SECTION     x 3   COLONOSCOPY WITH PROPOFOL N/A 01/11/2016   Surgeon: Danie Binder, MD; mild diverticulosis in proximal ascending colon, nonbleeding internal hemorrhoids.  Repeat in 10 years.   EXAMINATION UNDER ANESTHESIA  02/28/2012   Procedure: EXAM UNDER ANESTHESIA;  Surgeon: Donato Heinz, MD;  Location: [facility];  Service: General;  Laterality: N/A;   EXCISION MASS UPPER EXTREMETIES Left  11/13/2019   Procedure: EXCISION MASS UPPER EXTREMETIES ring finger left;  Surgeon: Carole Civil, MD;  Location: [facility];  Service: Orthopedics;  Laterality: Left;   GANGLION CYST EXCISION Left    HYSTEROSCOPY WITH D & C N/A 05/23/2013   Procedure: DILATATION AND CURETTAGE /HYSTEROSCOPY;  Surgeon: Jonnie Kind, MD;  Location: [facility];  Service: Gynecology;  Laterality: N/A;   POLYPECTOMY N/A 05/23/2013   Procedure: ENDOMETRIAL POLYP REMOVAL;  Surgeon: Jonnie Kind, MD;  Location: [facility];  Service: Gynecology;  Laterality: N/A;   SPHINCTEROTOMY  02/28/2012   Procedure: SPHINCTEROTOMY;  Surgeon: Donato Heinz, MD;  Location: [facility];  Service: General;  Laterality: N/A;  Lateral Internal Sphincterotomy   TRIGGER FINGER RELEASE Left    ring finger   TUBAL LIGATION     Patient Active Problem List   Diagnosis Date Noted   Hypokalemia 03/22/2022   Vitamin D deficiency 03/22/2022   Fall 03/22/2022   Current moderate episode of major depressive disorder without prior episode ([place]) 03/21/2022   Tobacco abuse 01/06/2022   Spinal stenosis of lumbar region 06/17/2021   Mass of lower outer quadrant of left breast 05/09/2021   Arthritis of left knee 12/02/2020   Chronic left shoulder pain 12/02/2020   Allergic sinusitis  12/02/2020   Mass of left finger    Anxiety 06/12/2019   Seizure disorder ([place]) 06/12/2019   Environmental and seasonal allergies 06/12/2019   Abnormal Pap smear of cervix 05/27/2019   Chronic midline low back pain with left-sided sciatica 01/12/2017   Essential hypertension 12/07/2016   Mild intermittent asthma without complication 55/37/4827    PCP:   Lindell Spar, MD    REFERRING PROVIDER: Carole Civil, MD  Neurologist apt 04/17/22  REFERRING DIAG: M78.67,J44.92 (ICD-10-CM) - Chronic midline low back pain with left-sided sciatica   Rationale for Evaluation and Treatment Rehabilitation  THERAPY DIAG:  Low back pain, unspecified back pain  laterality, unspecified chronicity, unspecified whether sciatica present  Chronic left-sided low back pain with left-sided sciatica  ONSET DATE: 12 years  SUBJECTIVE:                                                                                                                                                                                           SUBJECTIVE STATEMENT: Patient reports she missed her last appts due to birth of her grandson.  States she has swelling/pain at Rt hip 5/10 and has been compliant with HEP.  Still sleeping good at night now.  Feels she has improved 50%  PERTINENT HISTORY:  Cyst in left knee; seizures, anxiety and depression, HBP, herniated disc in back  PAIN:  Are you having pain? Yes: NPRS scale: 5/10 Pain location: Rt hip; no longer having pain in LB, left leg, right shoulder Pain description: aching and sore Aggravating factors: prolonged sitting, prolonged standing Relieving factors: icy hot, hydrocodone   PRECAUTIONS: None  WEIGHT BEARING RESTRICTIONS No  FALLS:  Has patient fallen in last 6 months? Yes. Number of falls multiple falls due to seizure  LIVING ENVIRONMENT: Lives with: lives with their son Lives in: House/apartment Stairs: Yes: External: 1 steps; none Has following equipment at home: None  OCCUPATION: not currently working  PLOF: Independent  PATIENT GOALS decreased pain   OBJECTIVE:   DIAGNOSTIC FINDINGS:  Clinical:  left knee pain referred from lower back, no trauma   X-rays were done of the lumbar spine, five views.   Normal lumbar lordosis is present.  Disc spaces are well maintained except at L5-S1 with slight degenerative changes.  No fracture is noted.  Bone quality is good.   Impression:  mild narrowing at L5-S1, no acute findings of lumbar spine.   Electronically Signed [name], MD 9/26/20238:17 AM    PATIENT SURVEYS:  FOTO 11/9:47% functional   at evaluation: 26%   predicted value  41%  COGNITION:  Overall cognitive status: Within functional limits  for tasks assessed     SENSATION: Left foot burning sometimes  MUSCLE LENGTH: Hamstrings: Right ~60 deg; Left ~30 deg   PALPATION: Noted muscle spasm right periscapular area today  LUMBAR ROM:   Active  AROM  eval 04/20/22  Flexion 70% available Finger tip to mid shin 90% avail  Extension 30% availalble * 50% avail  Right lateral flexion    Left lateral flexion    Right rotation    Left rotation     (Blank rows = not tested)  LOWER EXTREMITY MMT:    MMT Right eval Right 04/20/22 Left eval Left 04/20/22  Hip flexion 4- 4+ 3- 3+  Hip extension      Hip abduction      Hip adduction      Hip internal rotation      Hip external rotation      Knee flexion      Knee extension 4 5 3+ 4  Ankle dorsiflexion 4+ 5 3+ 4  Ankle plantarflexion      Ankle inversion      Ankle eversion       (Blank rows = not tested)  LUMBAR SPECIAL TESTS:  Straight leg raise test: Positive left side  FUNCTIONAL TESTS:  5 times sit to stand: 11/9: 12.48 no UE assist standard chair  at evaluation: 37.04 sec using arms to assist up to standing     TODAY'S TREATMENT  07/19/72 Recert/progress note FOTO 11/9:47% functional   at evaluation: 26% 5 times sit to stand: 11/9: 12.48 no UE assist standard chair  at evaluation: 37.04 sec using arms to assist up to standing MMT/ ROM (see above) Standing:heelraises 20X  Vectors 10X3" each with cues for posturing  High march holds 20X3"  04/11/22 Supine: LTR 10 x 5" hold Bridge 5" hold x 10 Bridge with ball for hip adduction x 10 Bridge with belt for hip abduction x 10  Prone: Prone press ups x 10 Hip extension 2 x 10  Standing: Heel raises x 20 Hip vectors x 5 each leg Slant board 5 x 20" RTB scapular retractions 2 x 10  5 x sit to stand 16 sec no UE assist; (STG and LTG met)     04/07/22 Supine:  LTR 5x 10" Bridge 10x 5" Prone: press up 10 Hip extension  10x Sidelying: Clam with RTB 10x 5"  Abduction 10x STS controlled eccentric control 10x Standing: Heel raises 10x Squats 10x   03/29/22 Supine LTR 10x 10 second holds bilateral  SKTC 5x 20 second holds bilateral  Supine ab set 10 x 5 second holds Supine ab set with march 2 x 10 bilateral  Bridge 2 x 10  Sidelying clam 2x 10 bilateral  POE 2 x 30 second holds Press up 2 x 10  Prone hip extension 2 x 10 alternating bilateral  STS 2 x 10    PATIENT EDUCATION:  Education details: 03/29/22: HEP; EVAL:Patient educated on exam findings, POC, scope of PT, HEP, and . Person educated: Patient Education method: Explanation, Demonstration, and Handouts Education comprehension: verbalized understanding, returned demonstration, verbal cues required, and tactile cues required  HOME EXERCISE PROGRAM: Access Code: [email or web address] URL: [email or web address] 04/11/22 scapular retractions and hip vectors 04/07/22: Squat and heel raise  03/29/22 - Prone Press Up  - 3 x daily - 7 x weekly - 2 sets - 10 reps - Supine Bridge  - 1 x daily - 7 x weekly - 2 sets - 10 reps  Date: 03/22/2022 - Sit  to Stand with Counter Support  - 1 x daily - 7 x weekly - 3 sets - 10 reps - Supine Lower Trunk Rotation  - 1 x daily - 7 x weekly - 3 sets - 10 reps - Hooklying Single Knee to Chest Stretch  - 1 x daily - 7 x weekly - 1 sets - 3 reps - 10" hold  ASSESSMENT:  CLINICAL IMPRESSION: Progress note/recert completed this session. Pt has missed several appts but able to complete 5 in the past 4 weeks.  Pt has made progress meeting all STG's and 2/5 LTG's.  Pt is still limited by LE pain/symptoms that varies Rt/Lt and with activity.  Pt still has LE weakness, Lt weaker than Rt but has had no falls since beginning therapy.  Pt reports compliance with HEP but unable to walk long distances for fear that LE will give way.  PT will continue to benefit from continued skilled therapy services to address deficits  and promote return to optimal function.      OBJECTIVE IMPAIRMENTS Abnormal gait, decreased activity tolerance, decreased endurance, decreased mobility, difficulty walking, decreased ROM, decreased strength, hypomobility, increased fascial restrictions, impaired perceived functional ability, increased muscle spasms, impaired flexibility, and pain.   ACTIVITY LIMITATIONS carrying, lifting, bending, sitting, standing, squatting, sleeping, stairs, transfers, bed mobility, bathing, toileting, dressing, hygiene/grooming, locomotion level, and caring for others  PARTICIPATION LIMITATIONS: meal prep, cleaning, laundry, driving, shopping, community activity, occupation, and yard work   [employer] POTENTIAL: Good  CLINICAL DECISION MAKING: Stable/uncomplicated  EVALUATION COMPLEXITY: Low   GOALS: Goals reviewed with patient? No  SHORT TERM GOALS: Target date: 04/05/2022  patient will be independent with initial HEP  Baseline: Goal status: MET  2.  Patient will improve 5 x STS score from 37.04 sec to 33 sec to demonstrate improved functional mobility and increased lower extremity strength.  Baseline: 04/11/22 16 sec Goal status: MET  3.  Patient will report at least 50% improvement in overall symptoms and/or function to demonstrate improved functional mobility  Baseline:  Goal status: MET    LONG TERM GOALS: Target date: 04/19/2022  Patient will be independent in self management strategies to improve quality of life and functional outcomes.  Baseline:  Goal status: IN PROGRESS  2.  Patient will report at least 75% improvement in overall symptoms and/or function to demonstrate improved functional mobility  Baseline:  Goal status: IN PROGRESS  3.  Patient will improve FOTO score to predicted value to demonstrate improved functional mobility (predictive value 41%)  Baseline: 26 Goal status: MET  4.  Patient will improve 5 x STS score from 37.04 sec to 30 sec to demonstrate  improved functional mobility and increased lower extremity strength.  Baseline: 04/11/22 16 sec Goal status: MET  5.   Patient will increase bilateral lower extremity tested MMTs to 4/5 to promote return to ambulation community distances with minimal deviation. Baseline: see above Goal status: IN PROGRESS  PLAN: PT FREQUENCY: 2x/week  PT DURATION: 4 weeks  PLANNED INTERVENTIONS: Therapeutic exercises, Therapeutic activity, Neuromuscular re-education, Balance training, Gait training, Patient/Family education, Joint manipulation, Joint mobilization, Stair training, Orthotic/Fit training, DME instructions, Aquatic Therapy, Dry Needling, Electrical stimulation, Spinal manipulation, Spinal mobilization, Cryotherapy, Moist heat, Compression bandaging, scar mobilization, Splintting, Taping, Traction, Ultrasound, Ionotophoresis 48m/ml Dexamethasone, and Manual therapy   PLAN FOR NEXT SESSION: core strength, hip strength, lumbar and hip mobility  8:49 AM, 04/20/22 ATeena Irani PTA/CLT CNationalPh: [phone]

## 2022-04-20 NOTE — Progress Notes (Signed)
Please call and advise the patient that the recent Keppra level was within normal limits. No further action is required on these tests at this time. Please remind patient to keep any upcoming appointments or tests and to call us with any interim questions, concerns, problems or updates. Thanks,   Alric Ran, MD

## 2022-04-25 ENCOUNTER — Encounter (HOSPITAL_COMMUNITY): Payer: Medicaid Other

## 2022-04-25 ENCOUNTER — Telehealth (HOSPITAL_COMMUNITY): Payer: Self-pay

## 2022-04-25 ENCOUNTER — Ambulatory Visit (INDEPENDENT_AMBULATORY_CARE_PROVIDER_SITE_OTHER): Payer: Medicaid Other | Admitting: Orthopaedic Surgery

## 2022-04-25 ENCOUNTER — Encounter: Payer: Self-pay | Admitting: Orthopaedic Surgery

## 2022-04-25 DIAGNOSIS — M25562 Pain in left knee: Secondary | ICD-10-CM | POA: Diagnosis not present

## 2022-04-25 DIAGNOSIS — G8929 Other chronic pain: Secondary | ICD-10-CM

## 2022-04-25 DIAGNOSIS — F17210 Nicotine dependence, cigarettes, uncomplicated: Secondary | ICD-10-CM

## 2022-04-25 MED ORDER — HYDROCODONE-ACETAMINOPHEN 5-325 MG PO TABS: ORAL_TABLET | ORAL | 0 refills | Status: DC

## 2022-04-25 MED ORDER — METHYLPREDNISOLONE ACETATE 40 MG/ML IJ SUSP
40.0000 mg | Freq: Once | INTRAMUSCULAR | Status: AC
  Administered 2022-04-25: 40 mg via INTRA_ARTICULAR

## 2022-04-25 NOTE — Telephone Encounter (Signed)
Pt called back at 11:14 informing therapist that she had MD apt earlier this morning and received a shot in Lt knee with reports of increased pain. Pt stated she will make it to therapy on Friday, assured she had contact number if needs to cancel/reschedule.   Ihor Austin, LPTA/CLT; Delana Meyer [phone]

## 2022-04-25 NOTE — Addendum Note (Signed)
Addended by: Obie Dredge A on: 04/25/2022 09:20 AM   Modules accepted: Orders

## 2022-04-25 NOTE — Progress Notes (Signed)
PROCEDURE NOTE:  The patient requests injections of the left knee , verbal consent was obtained.  The left knee was prepped appropriately after time out was performed.   Sterile technique was observed and injection of 1 cc of DepoMedrol 40 mg with several cc's of plain xylocaine. Anesthesia was provided by ethyl chloride and a 20-gauge needle was used to inject the knee area. The injection was tolerated well.  A band aid dressing was applied.  The patient was advised to apply ice later today and tomorrow to the injection sight as needed.  Encounter Diagnoses  Name Primary?   Chronic pain of left knee Yes   Cigarette nicotine dependence without complication    She is going to PT and is having improvement.  I have reviewed the notes.  I have reviewed the [place] web site prior to prescribing narcotic medicine for this patient.  Return in one month.  Continue PT.  Call if any problem.  Precautions discussed.  Electronically Signed [name], MD 11/14/20238:06 AM

## 2022-04-25 NOTE — Telephone Encounter (Signed)
No show, called and left message concerning missed apt today. Reminded next apt date and time with contact information included if needs to cancel or reschedule future apts. Included no show policy details in this message.   Ihor Austin, LPTA/CLT; Delana Meyer [phone]

## 2022-04-28 ENCOUNTER — Encounter (HOSPITAL_COMMUNITY): Payer: Medicaid Other

## 2022-05-01 ENCOUNTER — Encounter (HOSPITAL_COMMUNITY): Payer: Medicaid Other | Admitting: Physical Therapy

## 2022-05-02 ENCOUNTER — Telehealth: Payer: Self-pay

## 2022-05-02 ENCOUNTER — Encounter: Payer: Self-pay | Admitting: Internal Medicine

## 2022-05-02 ENCOUNTER — Ambulatory Visit: Payer: Medicaid Other | Admitting: Internal Medicine

## 2022-05-02 MED ORDER — HYDROCODONE-ACETAMINOPHEN 5-325 MG PO TABS: ORAL_TABLET | ORAL | 0 refills | Status: DC

## 2022-05-02 NOTE — Telephone Encounter (Signed)
Sent to provider 

## 2022-05-02 NOTE — Telephone Encounter (Signed)
Hydrocodone-Acetaminophen 5/325 MG  Qty 11 Tablets  PATIENT USES [facility] ON [address]

## 2022-05-03 ENCOUNTER — Encounter (HOSPITAL_COMMUNITY): Payer: Medicaid Other | Admitting: Physical Therapy

## 2022-05-09 ENCOUNTER — Encounter (HOSPITAL_COMMUNITY): Payer: Medicaid Other | Admitting: Physical Therapy

## 2022-05-09 ENCOUNTER — Other Ambulatory Visit: Payer: Medicaid Other | Admitting: *Deleted

## 2022-05-09 ENCOUNTER — Telehealth: Payer: Self-pay | Admitting: Orthopaedic Surgery

## 2022-05-09 MED ORDER — HYDROCODONE-ACETAMINOPHEN 5-325 MG PO TABS: ORAL_TABLET | ORAL | 0 refills | Status: DC

## 2022-05-09 NOTE — Telephone Encounter (Signed)
Request sent to provider.

## 2022-05-09 NOTE — Telephone Encounter (Signed)
Patient called, requesting a refill on Hydrocodone 5-325, 11 quantity to be sent to [facility] on [address].  Pt's # [phone]

## 2022-05-11 ENCOUNTER — Other Ambulatory Visit: Payer: Medicaid Other | Admitting: *Deleted

## 2022-05-11 ENCOUNTER — Encounter (HOSPITAL_COMMUNITY): Payer: Medicaid Other | Admitting: Physical Therapy

## 2022-05-12 ENCOUNTER — Encounter (HOSPITAL_COMMUNITY): Payer: Self-pay | Admitting: Physical Therapy

## 2022-05-12 ENCOUNTER — Encounter (HOSPITAL_COMMUNITY): Payer: Medicaid Other | Admitting: Physical Therapy

## 2022-05-12 NOTE — Therapy (Unsigned)
Pt missed appointment.  She has not been to therapy since 04/20/22 cancelling her last three appointments.  Therapist called PT who stated that she wanted to be discharged at this time and would start therapy back up next year.   PHYSICAL THERAPY DISCHARGE SUMMARY  Visits from Start of Care: 5  Current functional level related to goals / functional outcomes: Continued back pain   Remaining deficits: Strength    Education / Equipment: HEP   Patient agrees to discharge. Patient goals were partially met. Patient is being discharged due to not returning since the last visit.   Rayetta Humphrey, [place] CLT [phone]

## 2022-05-15 ENCOUNTER — Encounter (HOSPITAL_COMMUNITY): Payer: Medicaid Other | Admitting: Physical Therapy

## 2022-05-16 ENCOUNTER — Other Ambulatory Visit (HOSPITAL_COMMUNITY): Payer: Self-pay | Admitting: Adult Health

## 2022-05-16 ENCOUNTER — Other Ambulatory Visit: Payer: Self-pay | Admitting: Orthopaedic Surgery

## 2022-05-16 DIAGNOSIS — N6320 Unspecified lump in the left breast, unspecified quadrant: Secondary | ICD-10-CM

## 2022-05-16 MED ORDER — HYDROCODONE-ACETAMINOPHEN 5-325 MG PO TABS: ORAL_TABLET | ORAL | 0 refills | Status: DC

## 2022-05-16 NOTE — Telephone Encounter (Signed)
Patient called, lvm stating that her pain medications are due today.  She is requesting a refill on Hydrocodone 5-325, 11 tablets to be called to [facility] on [address].  Pt's # [phone]

## 2022-05-17 ENCOUNTER — Encounter (HOSPITAL_COMMUNITY): Payer: Medicaid Other | Admitting: Physical Therapy

## 2022-05-23 ENCOUNTER — Ambulatory Visit (INDEPENDENT_AMBULATORY_CARE_PROVIDER_SITE_OTHER): Payer: Medicaid Other | Admitting: Orthopaedic Surgery

## 2022-05-23 ENCOUNTER — Encounter: Payer: Self-pay | Admitting: Orthopaedic Surgery

## 2022-05-23 VITALS — BP 140/82 | HR 66

## 2022-05-23 DIAGNOSIS — G8929 Other chronic pain: Secondary | ICD-10-CM | POA: Diagnosis not present

## 2022-05-23 DIAGNOSIS — M25562 Pain in left knee: Secondary | ICD-10-CM | POA: Diagnosis not present

## 2022-05-23 DIAGNOSIS — F17210 Nicotine dependence, cigarettes, uncomplicated: Secondary | ICD-10-CM | POA: Diagnosis not present

## 2022-05-23 MED ORDER — HYDROCODONE-ACETAMINOPHEN 5-325 MG PO TABS: ORAL_TABLET | ORAL | 0 refills | Status: DC

## 2022-05-23 NOTE — Progress Notes (Signed)
My knee is hurting.  She has continued pain of the left knee.  She says the injection last time did not help that much.  She has no new trauma, no giving way.  She has swelling and popping.  She uses a cane.  Left knee has slight effusion, crepitus, medial pain, ROM 0 to 105, stable, NV intact.  No distal edema.  Encounter Diagnoses  Name Primary?   Chronic pain of left knee Yes   Cigarette nicotine dependence without complication    I have reviewed the [place] web site prior to prescribing narcotic medicine for this patient.  Return in one month.  Resume PT.  New Rx given.  Call if any problem.  Precautions discussed.  Electronically Signed [name], MD 12/12/20238:09 AM

## 2022-05-23 NOTE — Addendum Note (Signed)
Addended by: Obie Dredge A on: 05/23/2022 08:52 AM   Modules accepted: Orders

## 2022-05-27 ENCOUNTER — Encounter (HOSPITAL_COMMUNITY): Payer: Self-pay | Admitting: Emergency Medicine

## 2022-05-27 ENCOUNTER — Other Ambulatory Visit: Payer: Self-pay

## 2022-05-27 ENCOUNTER — Emergency Department (HOSPITAL_COMMUNITY)
Admission: EM | Admit: 2022-05-27 | Discharge: 2022-05-27 | Disposition: A | Discharge status: Home / Self Care | Payer: Medicaid Other | Attending: Emergency Medicine | Admitting: Emergency Medicine

## 2022-05-27 DIAGNOSIS — G8929 Other chronic pain: Secondary | ICD-10-CM | POA: Diagnosis not present

## 2022-05-27 DIAGNOSIS — M25562 Pain in left knee: Secondary | ICD-10-CM | POA: Diagnosis not present

## 2022-05-27 DIAGNOSIS — M545 Low back pain, unspecified: Secondary | ICD-10-CM | POA: Diagnosis not present

## 2022-05-27 MED ORDER — PREDNISONE 50 MG PO TABS
60.0000 mg | ORAL_TABLET | Freq: Once | ORAL | Status: AC
  Administered 2022-05-27: 60 mg via ORAL
  Filled 2022-05-27: qty 1

## 2022-05-27 MED ORDER — PREDNISONE 10 MG PO TABS: ORAL_TABLET | ORAL | 0 refills | Status: DC

## 2022-05-27 NOTE — ED Triage Notes (Signed)
Pt c/o chronic back pain that radiates to left knee. Swelling today.

## 2022-05-27 NOTE — Discharge Instructions (Addendum)
Continue your current home medications.  Add the prednisone taper prescribed.  You have received today's dose so do not take your next dose of this prescription until tomorrow.  You may also benefit by using a heating pad 20 minutes 3-4 times daily to your lower back and also to your left knee as well.  You may use the knee brace given as needed to help provide comfort and support your knee.  Plan follow-up with Dr. Luna Glasgow as needed.

## 2022-05-27 NOTE — ED Provider Notes (Signed)
[facility] EMERGENCY DEPARTMENT Provider Note   CSN: [id number] Arrival date & time: 05/27/22  8937     History  Chief Complaint  Patient presents with   Back Pain    Judy Cross is a 56 y.o. female presenting for evaluation of worsening pain in her left knee, also has chronic low back pain which she believes is actually radiating to the left knee.  She is under the care of Dr. Luna Glasgow, including physical therapy which she has just completed but states she has "failed" so will start again in January.  She is taking hydrocodone which is prescribed by Dr. Luna Glasgow, has also been on muscle relaxers in the past, not especially helpful however.  She endorses increased swelling behind her left knee today.  She denies radiation of pain into her lower leg or calf.  No new injuries or falls.  She used to have a knee brace which was helpful but states it wore out.  She walks with a cane at baseline.  She is currently using hydrocodone 5/325, but believes she may be "immune" as she has been taking this medication chronically.  She denies weakness or numbness in her legs, no recent falls as mentioned.  No urinary and fecal incontinence or retention, no fevers.  The history is provided by the patient.       Home Medications Prior to Admission medications   Medication Sig Start Date End Date Taking? Authorizing Provider  predniSONE (DELTASONE) 10 MG tablet 6, 5, 4, 3, 2 then 1 tablet by mouth daily for 6 days total. 05/27/22  Yes Acxel Dingee, Almyra Free, PA-C  albuterol (PROAIR HFA) 108 (90 Base) MCG/ACT inhaler INHALE 1-2 PUFFS BY MOUTH EVERY 6 HOURS AS NEEDED FOR WHEEZE OR SHORTNESS OF BREATHMNT 03/10/22   [name], FNP  cyclobenzaprine (FLEXERIL) 10 MG tablet Take 1 tablet (10 mg total) by mouth 2 (two) times daily as needed for muscle spasms. 12/02/20   Lindell Spar, MD  hydrochlorothiazide (HYDRODIURIL) 25 MG tablet Take 1 tablet (25 mg total) by mouth daily. 01/06/22   Lindell Spar, MD   HYDROcodone-acetaminophen (NORCO/VICODIN) 5-325 MG tablet One tablet every six hours for pain.  Limit 5 days. 05/23/22   [name], MD  ibuprofen (ADVIL) 600 MG tablet Take 1 tablet (600 mg total) by mouth every 6 (six) hours as needed. 02/08/22   Myna Bright M, PA-C  levETIRAcetam (KEPPRA) 750 MG tablet Take 1 tablet (750 mg total) by mouth 2 (two) times daily. 04/17/22 04/12/23  Alric Ran, MD  levocetirizine (XYZAL) 5 MG tablet Take 1 tablet (5 mg total) by mouth every evening. 03/22/22   Lindell Spar, MD  naproxen (NAPROSYN) 500 MG tablet Take 1 tablet (500 mg total) by mouth 2 (two) times daily with a meal. 10/11/21   [name], MD  omeprazole (PRILOSEC) 20 MG capsule Take 1 capsule (20 mg total) by mouth daily. 11/17/21   [name], MD  potassium chloride (KLOR-CON M) 10 MEQ tablet Take 1 tablet (10 mEq total) by mouth daily. 03/22/22   Lindell Spar, MD  sertraline (ZOLOFT) 25 MG tablet Take 1 tablet (25 mg total) by mouth daily. 03/21/22   Lindell Spar, MD  Vitamin D, Ergocalciferol, (DRISDOL) 1.25 MG (50000 UNIT) CAPS capsule Take 1 capsule (50,000 Units total) by mouth every 7 (seven) days. 03/22/22   Lindell Spar, MD      Allergies    Lisinopril, Penicillins, and Tramadol    Review of  Systems   Review of Systems  Constitutional:  Negative for fever.  Musculoskeletal:  Positive for arthralgias, back pain and joint swelling. Negative for myalgias.  Neurological:  Negative for weakness and numbness.  All other systems reviewed and are negative.   Physical Exam Updated Vital Signs BP (!) 147/111   Pulse (!) 56   Temp 98.5 F (36.9 C)   Resp 18   Ht 5' 1.5" (1.562 m)   Wt 61.2 kg   SpO2 100%   BMI 25.09 kg/m  Physical Exam Vitals and nursing note reviewed.  Constitutional:      Appearance: She is well-developed.  HENT:     Head: Normocephalic.  Eyes:     Conjunctiva/sclera: Conjunctivae normal.  Cardiovascular:     Rate and Rhythm:  Normal rate.     Pulses: Normal pulses.     Comments: Pedal pulses normal. Pulmonary:     Effort: Pulmonary effort is normal.  Musculoskeletal:        General: Swelling present. No deformity. Normal range of motion.     Cervical back: Normal range of motion and neck supple.     Lumbar back: Tenderness present. No swelling, edema or spasms.     Left knee: Effusion present. No erythema. Normal range of motion. Tenderness present over the medial joint line. Normal pulse.     Right lower leg: No edema.     Left lower leg: No edema.     Comments: Trace effusion left knee joint, no appreciable Baker's cyst present.  Mild crepitus with range of motion.  No ligament instability.  Skin:    General: Skin is warm and dry.  Neurological:     General: No focal deficit present.     Mental Status: She is alert.     Sensory: No sensory deficit.     Motor: No tremor or atrophy.     Gait: Gait normal.     Deep Tendon Reflexes:     Reflex Scores:      Patellar reflexes are 2+ on the right side and 2+ on the left side.      Achilles reflexes are 2+ on the right side and 2+ on the left side.    Comments: No strength deficit noted in hip and knee flexor and extensor muscle groups.  Ankle flexion and extension intact.     ED Results / Procedures / Treatments   Labs (all labs ordered are listed, but only abnormal results are displayed) Labs Reviewed - No data to display  EKG None  Radiology No results found.  Procedures Procedures    Medications Ordered in ED Medications  predniSONE (DELTASONE) tablet 60 mg (60 mg Oral Given 05/27/22 1000)    ED Course/ Medical Decision Making/ A&P                           Medical Decision Making Patient presenting with acute on chronic low back and left knee pain under the care of Dr. Luna Glasgow, has just completed physical therapy.  She is already on hydrocodone chronically, no new injuries, no red flags for septic joint or cauda equina I.  She will be  be placed on a prednisone taper, given a left knee sleeve for knee support.  Recommended heat therapy and close follow-up with Dr. Luna Glasgow.  Risk Prescription drug management.           Final Clinical Impression(s) / ED Diagnoses Final diagnoses:  Chronic pain  of left knee  Chronic midline low back pain, unspecified whether sciatica present    Rx / DC Orders ED Discharge Orders          Ordered    predniSONE (DELTASONE) 10 MG tablet        05/27/22 7824              Evalee Jefferson, PA-C 05/27/22 1006    Carmin Muskrat, MD 05/27/22 1025

## 2022-05-30 ENCOUNTER — Telehealth: Payer: Self-pay | Admitting: *Deleted

## 2022-05-30 ENCOUNTER — Other Ambulatory Visit: Payer: Medicaid Other | Admitting: Pharmacist

## 2022-05-30 ENCOUNTER — Telehealth: Payer: Self-pay

## 2022-05-30 ENCOUNTER — Encounter: Payer: Self-pay | Admitting: *Deleted

## 2022-05-30 DIAGNOSIS — Z5986 Financial insecurity: Secondary | ICD-10-CM

## 2022-05-30 DIAGNOSIS — G40909 Epilepsy, unspecified, not intractable, without status epilepticus: Secondary | ICD-10-CM

## 2022-05-30 MED ORDER — HYDROCODONE-ACETAMINOPHEN 5-325 MG PO TABS: ORAL_TABLET | ORAL | 0 refills | Status: DC

## 2022-05-30 NOTE — Telephone Encounter (Signed)
Sent to provider 

## 2022-05-30 NOTE — Patient Outreach (Signed)
  Care Coordination TOC Note Transition Care Management Follow-up Telephone Call Date of discharge and from where: 05/27/22 from [facility] How have you been since you were released from the hospital? Patient continues to have knee pain and is unable to afford any of her medications Any questions or concerns? Yes  Items Reviewed: Did the pt receive and understand the discharge instructions provided? Yes  Medications obtained and verified? No Patient unable to afford prescriptions Other? No  Any new allergies since your discharge? No  Dietary orders reviewed? Yes Do you have support at home? Yes   Home Care and Equipment/Supplies: Were home health services ordered? no If so, what is the name of the agency? N/A  Has the agency set up a time to come to the patient's home? not applicable Were any new equipment or medical supplies ordered?  No What is the name of the medical supply agency? N/A Were you able to get the supplies/equipment? not applicable Do you have any questions related to the use of the equipment or supplies? No  Functional Questionnaire: (I = Independent and D = Dependent) ADLs: I  Bathing/Dressing- I  Meal Prep- I  Eating- I  Maintaining continence- I  Transferring/Ambulation- I  Managing Meds- I  Follow up appointments reviewed:  PCP Hospital f/u appt confirmed?  N/A-ED visit  . [facility] f/u appt confirmed? Yes  Scheduled to see Ortho on 05/31/22 . Are transportation arrangements needed? No  If their condition worsens, is the pt aware to call PCP or go to the Emergency Dept.? Yes Was the patient provided with contact information for the PCP's office or ED? No Was to pt encouraged to call back with questions or concerns? Yes  RNCM provided patient with information about Case Management services with [facility] Care Team. She consents to CM services. Scheduled with BSW, LCSW and RNCM. Referral to Pharmacy placed.   Lurena Joiner RN, BSN [place] RN Care Coordinator

## 2022-05-30 NOTE — Telephone Encounter (Signed)
Hydrocodone-Acetaminophen 5/325 MG  Qty 11 Tablets  PATIENT USES [facility] ON [address]

## 2022-05-30 NOTE — Progress Notes (Signed)
Care Coordination Call  Received message from RN CM today - patient noted that she was out of her medications and could not afford them. Contacted patient. She notes that the pharmacy is charging her the full price of the medication, they are showing a third party insurance.   Contacted [facility]. They note that she has been told for several months that she needs to call Medicaid. Contacted patient, notified of such. Encouraged to call customer service number on the back of her Medicaid card. She plans to do so between now and SW follow up on Friday.   Catie Hedwig Morton, PharmD, [facility] [phone]

## 2022-05-31 ENCOUNTER — Ambulatory Visit: Payer: Medicaid Other | Admitting: Orthopaedic Surgery

## 2022-05-31 ENCOUNTER — Ambulatory Visit (INDEPENDENT_AMBULATORY_CARE_PROVIDER_SITE_OTHER): Payer: Medicaid Other | Admitting: Orthopaedic Surgery

## 2022-05-31 ENCOUNTER — Ambulatory Visit (HOSPITAL_COMMUNITY)
Admission: RE | Admit: 2022-05-31 | Discharge: 2022-05-31 | Disposition: A | Discharge status: Home / Self Care | Payer: Medicaid Other | Source: Ambulatory Visit | Attending: Adult Health | Admitting: Adult Health

## 2022-05-31 ENCOUNTER — Encounter: Payer: Self-pay | Admitting: Orthopaedic Surgery

## 2022-05-31 ENCOUNTER — Other Ambulatory Visit: Payer: Self-pay | Admitting: Adult Health

## 2022-05-31 DIAGNOSIS — N6321 Unspecified lump in the left breast, upper outer quadrant: Secondary | ICD-10-CM | POA: Diagnosis not present

## 2022-05-31 DIAGNOSIS — N6323 Unspecified lump in the left breast, lower outer quadrant: Secondary | ICD-10-CM | POA: Diagnosis not present

## 2022-05-31 DIAGNOSIS — R928 Other abnormal and inconclusive findings on diagnostic imaging of breast: Secondary | ICD-10-CM

## 2022-05-31 DIAGNOSIS — G8929 Other chronic pain: Secondary | ICD-10-CM

## 2022-05-31 DIAGNOSIS — N6320 Unspecified lump in the left breast, unspecified quadrant: Secondary | ICD-10-CM

## 2022-05-31 DIAGNOSIS — F17210 Nicotine dependence, cigarettes, uncomplicated: Secondary | ICD-10-CM

## 2022-05-31 DIAGNOSIS — M25562 Pain in left knee: Secondary | ICD-10-CM | POA: Diagnosis not present

## 2022-05-31 NOTE — Progress Notes (Signed)
My knee is worse.  She went to the ER on 05-27-22 with knee pain.  She was seen and evaluated.  I have read the notes.  She is scheduled for PT but it will not begin until 06-21-22.    Prior MRIs were negative.  She needs to go to PT first then get new MRI for insurance approval.    She wears a knee brace and uses crutches.  Left knee is tender, no effusion, slight crepitus, ROM 0 to 105 with medial pain, stable.  NV intact.  Encounter Diagnoses  Name Primary?   Chronic pain of left knee Yes   Cigarette nicotine dependence without complication    I told her I really have nothing new to offer.  She will need the MRI after PT.  She may need to go to [facility] or [facility] for further evaluation.  She is on pain medicine, continue it.  Return in one month.  Call if any problem.  Precautions discussed.  Electronically Signed [name], MD 12/20/20238:17 AM

## 2022-05-31 NOTE — Progress Notes (Signed)
Order placed for diagnostic mammogram and left breast US, due June 2024

## 2022-06-02 ENCOUNTER — Other Ambulatory Visit: Payer: Medicaid Other

## 2022-06-02 NOTE — Patient Instructions (Signed)
Visit Information  Judy Cross was given information about Medicaid Managed Care team care coordination services as a part of their [place] Medicaid benefit. Judy Cross verbally consented to engagement with the [facility] Managed Care team.   If you are experiencing a medical emergency, please call 911 or report to your local emergency department or urgent care.   If you have a non-emergency medical problem during routine business hours, please contact your provider's office and ask to speak with a nurse.   For questions related to your [facility], please call: [phone] or visit the homepage here: [email or web address]  If you would like to schedule transportation through your [facility], please call the following number at least 2 days in advance of your appointment: [phone]   Rides for urgent appointments can also be made after hours by calling Member Services.  Call the [place] at [phone], at any time, 24 hours a day, 7 days a week. If you are in danger or need immediate medical attention call 911.  If you would like help to quit smoking, call 1-800-QUIT-NOW 515-462-3485) OR Espaol: 1-855-Djelo-Ya (7-169-678-9381) o para ms informacin haga clic aqu or Text READY to 200-400 to register via text  Judy Cross - following are the goals we discussed in your visit today:   Goals Addressed   None       Social Worker will follow up on 06/22/21.   Judy Cross, BSW, [place]  High Risk Managed Medicaid Team  [phone]   Following is a copy of your plan of care:  Care Plan : LCSW Plan of Care  Updates made by Ethelda Chick since 06/02/2022 12:00 AM     Problem: Coping Skills for symptoms of Anxiety      Goal: Coping Skills Enhanced by connecting for ongoing therapy    Start Date: 07/20/2021  Recent Progress: On track  Priority: High  Note:   Current Barriers:  Disease Management support and education needs related to Anxiety with Panic Symptoms, and Stress at home  CSW Clinical Goal(s):  Patient  will work with therapist to address needs related to managing symptoms of anxiety  through collaboration with Clinical Education officer, museum, provider, and care team.   Interventions: 1:1 collaboration with primary care provider regarding development and update of comprehensive plan of care as evidenced by provider attestation and co-signature Inter-disciplinary care team collaboration (see longitudinal plan of care) Evaluation of current treatment plan related to  self management and patient's adherence to plan as established by provider Review resources, discussed options and provided patient information about  Department of Social Services ( currently receiving services ) Transportation provided by insurance provider (currently using) Discussed therapy with options based on need and insurance  BSW completed a telephone outreach with patient, she states she is not working and does not have any income. Patient states she is in need of resources for everything. BSW will mail patient a list of resources.  Mental Health:  (Status: Goal on Track (progressing): YES.) Evaluation of current treatment plan related to Anxiety with Panic Symptoms, Solution-Focused Strategies employed:  Mindfulness or Relaxation training provided Active listening / Reflection utilized  Emotional Support Provided Quality of sleep assessed & Sleep Hygiene techniques promoted  Provided EMMI education information on relieving stress, and insomnia getting a good night's rest Suicidal Ideation/Homicidal Ideation assessed: denied Collaborated with [facility] in [place], [place]  Judy Cross [phone] Made referral to [facility]( patient has appointment 07/22/2021 . Patient was  unable to attend this appointment due to a family emergency. She has not rescheduled this appointment yet but will do so today. 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 Patient reports that she has already completed her UDS, TB test and Criminal Background.  Patient reports that she is implementing appropriate boundaries to implement negative people, places and things. Patient works on her breathing exercises daily.  Patient denies any SI/HI. Patient reports that she had a difficult day on her father's birthday last week but was able to turn it around and even let a balloon go in remembrance of her father. Positive reinforcement provided.   LCSW provided education on relaxation techniques such as meditation, deep breathing, massage, grounding exercsies or yoga that can activate the body's relaxation response and ease symptoms of stress and anxiety. LCSW ask that when pt is struggling with difficult emotions and racing thoughts that they start this relaxation response process. LCSW provided extensive education on healthy coping skills for anxiety. SW used active and reflective listening, validated patient's feelings/concerns, and provided emotional support.  Patient Self-Care Activities: Reschedule appointment with [facility] Review your EMMI educational information (Reliving Stress and getting a good nights rest) Look for an e-mail from [facility] Practice relaxed breathing 3 times a day     24- Hour Availability:    [facility]  [place], [place] Crisis [phone]   Family Service of the [employer] [place]  [phone]    [place]  [phone] (after hours)   Therapeutic Alternative/Mobile Crisis   [phone]   [place]  National Suicide Hotline  ([phone] Judy Cross) [place] 988   Call 911 or go to emergency room   [facility]  [phone]);  [place] and [employer]  [phone]); [place], [place], [place], [place], [place], [place], [place]          Depression screen [facility] 2/9 08/26/2021 06/17/2021 12/02/2020 10/12/2020 06/10/2020  Decreased Interest 0 0 0 3 1  Down, Depressed, Hopeless 1 1 0 3 3  PHQ - 2 Score 1 1 0 6 4  Altered sleeping 0 - - 2 3  Tired, decreased energy 1 - - 2 3  Change in appetite 0 - - 1 3  Feeling bad or failure about yourself  0 - - 1 0  Trouble concentrating 0 - - 0 1  Moving slowly or fidgety/restless 0 - - 0 1  Suicidal thoughts 0 - - 0 0  PHQ-9 Score 2 - - 12 15  Difficult doing work/chores - - - - Somewhat difficult  Some recent data might be hidden

## 2022-06-02 NOTE — Patient Outreach (Signed)
Medicaid Managed Care Social Work Note  06/02/2022 Name:  Judy Cross MRN:  [id number] DOB:  [birth year]  Judy Cross is an 56 y.o. year old female who is a primary patient of Lindell Spar, MD.  The Medicaid Managed Care Coordination team was consulted for assistance with:  Community Resources   Judy Cross was given information about Medicaid Managed Care Coordination team services today. Judy Cross Patient agreed to services and verbal consent obtained.  Engaged with patient  for by telephone forinitial visit in response to referral for case management and/or care coordination services.   Assessments/Interventions:  Review of past medical history, allergies, medications, health status, including review of consultants reports, laboratory and other test data, was performed as part of comprehensive evaluation and provision of chronic care management services.  SDOH: (Social Determinant of Health) assessments and interventions performed: SDOH Interventions    Flowsheet Row Telephone from 05/30/2022 in [place] Patient Outreach Telephone from 08/26/2021 in [place] Patient Outreach Telephone from 07/20/2021 in [place] Coordination  SDOH Interventions     Food Insecurity Interventions -- -- Intervention Not Indicated  Transportation Interventions Intervention Not Indicated -- --  Depression Interventions/Treatment  -- Counseling --  Financial Strain Interventions --  [Referred to [employer] and Pharmacy] -- Other (Comment)  [discussed options]  Stress Interventions -- Provide Counseling Provide Counseling     BSW completed a telephone outreach with patient, she states she is not working and does not have any income. Patient states she is in need of resources for everything. BSW will mail patient a list of resources.  Advanced Directives Status:  Not addressed in this  encounter.  Care Plan                 Allergies  Allergen Reactions   Lisinopril Swelling    swelling to entire mouth.   Penicillins Rash    Has patient had a PCN reaction causing immediate rash, facial/tongue/throat swelling, SOB or lightheadedness with hypotension: no - next day Has patient had a PCN reaction causing severe rash involving mucus membranes or skin necrosis: No Has patient had a PCN reaction that required hospitalization No Has patient had a PCN reaction occurring within the last 10 years: Yes If all of the above answers are "NO", then may proceed with Cephalosporin use.    Tramadol Other (See Comments)    dizziness    Medications Reviewed Today     Reviewed by [name], MD (Physician) on 05/31/22 at [place] List Status: <None>   Medication Order Taking? Sig Documenting Provider Last Dose Status Informant  albuterol (PROAIR HFA) 108 (90 Base) MCG/ACT inhaler [id number] No INHALE 1-2 PUFFS BY MOUTH EVERY 6 HOURS AS NEEDED FOR WHEEZE OR SHORTNESS OF BREATHMNT  Patient not taking: Reported on 05/30/2022   [name], [place] Not Taking Active            Med Note [name], CATHERINE T   Tue May 30, 2022  3:27 PM)    cyclobenzaprine (FLEXERIL) 10 MG tablet [id number] No Take 1 tablet (10 mg total) by mouth 2 (two) times daily as needed for muscle spasms.  Patient not taking: Reported on 05/30/2022   Lindell Spar, MD Not Taking Active Self  hydrochlorothiazide (HYDRODIURIL) 25 MG tablet [id number] No Take 1 tablet (25 mg total) by mouth daily.  Patient not taking: Reported on 05/30/2022   Lindell Spar, MD Not  Taking Active   HYDROcodone-acetaminophen (NORCO/VICODIN) 5-325 MG tablet [id number] No One tablet every six hours for pain.  Limit 5 days.  Patient not taking: Reported on 05/30/2022   [name], MD Not Taking Active   ibuprofen (ADVIL) 600 MG tablet [id number] No Take 1 tablet (600 mg total) by mouth every 6 (six) hours as needed.  Patient not  taking: Reported on 05/30/2022   Hendricks Limes, PA-C Not Taking Active   levETIRAcetam (KEPPRA) 750 MG tablet [id number] No Take 1 tablet (750 mg total) by mouth 2 (two) times daily.  Patient not taking: Reported on 05/30/2022   Alric Ran, MD Not Taking Active   levocetirizine (XYZAL) 5 MG tablet [id number] No Take 1 tablet (5 mg total) by mouth every evening.  Patient not taking: Reported on 05/30/2022   Lindell Spar, MD Not Taking Active   naproxen (NAPROSYN) 500 MG tablet [id number] No Take 1 tablet (500 mg total) by mouth 2 (two) times daily with a meal.  Patient not taking: Reported on 05/30/2022   [name], MD Not Taking Active Self  omeprazole (PRILOSEC) 20 MG capsule [id number] No Take 1 capsule (20 mg total) by mouth daily.  Patient not taking: Reported on 05/30/2022   [name], MD Not Taking Active   potassium chloride (KLOR-CON M) 10 MEQ tablet [id number] No Take 1 tablet (10 mEq total) by mouth daily.  Patient not taking: Reported on 05/30/2022   Lindell Spar, MD Not Taking Active   predniSONE (DELTASONE) 10 MG tablet [id number] No 6, 5, 4, 3, 2 then 1 tablet by mouth daily for 6 days total.  Patient not taking: Reported on 05/30/2022   Evalee Jefferson, PA-C Not Taking Active   sertraline (ZOLOFT) 25 MG tablet [id number] No Take 1 tablet (25 mg total) by mouth daily.  Patient not taking: Reported on 05/30/2022   Lindell Spar, MD Not Taking Active   Vitamin D, Ergocalciferol, (DRISDOL) 1.25 MG (50000 UNIT) CAPS capsule [id number] No Take 1 capsule (50,000 Units total) by mouth every 7 (seven) days.  Patient not taking: Reported on 05/30/2022   Lindell Spar, MD Not Taking Active             Patient Active Problem List   Diagnosis Date Noted   Hypokalemia 03/22/2022   Vitamin D deficiency 03/22/2022   Fall 03/22/2022   Current moderate episode of major depressive disorder without prior episode ([place]) 03/21/2022   Tobacco abuse 01/06/2022    Spinal stenosis of lumbar region 06/17/2021   Mass of lower outer quadrant of left breast 05/09/2021   Arthritis of left knee 12/02/2020   Chronic left shoulder pain 12/02/2020   Allergic sinusitis 12/02/2020   Mass of left finger    Anxiety 06/12/2019   Seizure disorder ([place]) 06/12/2019   Environmental and seasonal allergies 06/12/2019   Abnormal Pap smear of cervix 05/27/2019   Chronic midline low back pain with left-sided sciatica 01/12/2017   Essential hypertension 12/07/2016   Mild intermittent asthma without complication 25/42/7062    Conditions to be addressed/monitored per PCP order:   community resources  Care Plan : LCSW Plan of Care  Updates made by Ethelda Chick since 06/02/2022 12:00 AM     Problem: Coping Skills for symptoms of Anxiety      Goal: Coping Skills Enhanced by connecting for ongoing therapy   Start Date: 07/20/2021  Recent Progress: On track  Priority: High  Note:   Current Barriers:  Disease Management  support and education needs related to Anxiety with Panic Symptoms, and Stress at home  CSW Clinical Goal(s):  Patient  will work with therapist to address needs related to managing symptoms of anxiety  through collaboration with Clinical Education officer, museum, provider, and care team.   Interventions: 1:1 collaboration with primary care provider regarding development and update of comprehensive plan of care as evidenced by provider attestation and co-signature Inter-disciplinary care team collaboration (see longitudinal plan of care) Evaluation of current treatment plan related to  self management and patient's adherence to plan as established by provider Review resources, discussed options and provided patient information about  Department of Social Services ( currently receiving services ) Transportation provided by insurance provider (currently using) Discussed therapy with options based on need and insurance  BSW completed a telephone outreach with  patient, she states she is not working and does not have any income. Patient states she is in need of resources for everything. BSW will mail patient a list of resources.  Mental Health:  (Status: Goal on Track (progressing): YES.) Evaluation of current treatment plan related to Anxiety with Panic Symptoms, Solution-Focused Strategies employed:  Mindfulness or Relaxation training provided Active listening / Reflection utilized  Emotional Support Provided Quality of sleep assessed & Sleep Hygiene techniques promoted  Provided EMMI education information on relieving stress, and insomnia getting a good night's rest Suicidal Ideation/Homicidal Ideation assessed: denied Collaborated with [facility] in [place] [phone] Made referral to [facility]( patient has appointment 07/22/2021 . Patient was unable to attend this appointment due to a family emergency. She has not rescheduled this appointment yet but will do so today. 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 Patient reports that she has already completed her UDS, TB test and Criminal Background.  Patient reports that she is implementing appropriate boundaries to implement negative people, places and things. Patient works on her breathing exercises daily.  Patient denies any SI/HI. Patient reports that she had a difficult day on her father's birthday last week but was able to turn it around and even let a balloon go in remembrance of her father. Positive reinforcement provided.   LCSW provided education on relaxation techniques such as meditation, deep breathing, massage, grounding exercsies or yoga that can activate the body's relaxation response and ease symptoms of stress and anxiety. LCSW ask that when pt is struggling with difficult emotions and racing thoughts that they start this relaxation  response process. LCSW provided extensive education on healthy coping skills for anxiety. SW used active and reflective listening, validated patient's feelings/concerns, and provided emotional support.  Patient Self-Care Activities: Reschedule appointment with [facility] Review your EMMI educational information (Reliving Stress and getting a good nights rest) Look for an e-mail from [facility]. Practice relaxed breathing 3 times a day     24- Hour Availability:    [facility]  [place], [place] Crisis [phone]   Family Service of the [employer] [place]  [phone]    [place]  [phone] (after hours)   Therapeutic Alternative/Mobile Crisis   [phone]   [place] National Suicide Hotline  3237072493 Diamantina Monks) [place] 988   Call 911 or go to emergency room   [facility]  [phone]);  [place]  and [place]  [phone]); [place], [place], [place], [place], [place], [place], [place]          Depression screen [facility] 2/9 08/26/2021 06/17/2021 12/02/2020 10/12/2020 06/10/2020  Decreased Interest 0 0 0 3 1  Down, Depressed, Hopeless 1 1 0 3 3  PHQ - 2 Score 1 1 0 6 4  Altered sleeping 0 - - 2 3  Tired, decreased energy 1 - - 2 3  Change in appetite 0 - - 1 3  Feeling bad or failure about yourself  0 - - 1 0  Trouble concentrating 0 - - 0 1  Moving slowly or fidgety/restless 0 - - 0 1  Suicidal thoughts 0 - - 0 0  PHQ-9 Score 2 - - 12 15  Difficult doing work/chores - - - - Somewhat difficult  Some recent data might be hidden      Follow up:  Patient agrees to Care Plan and Follow-up.  Plan: The Managed Medicaid care management team will reach out to the patient again over the next 14 days.  Date/time of next scheduled Social Work care management/care coordination outreach:   06/22/22  Mickel Fuchs, Arita Miss, [place] Medicaid Team  [phone]

## 2022-06-07 ENCOUNTER — Other Ambulatory Visit: Payer: Self-pay | Admitting: Orthopaedic Surgery

## 2022-06-07 ENCOUNTER — Other Ambulatory Visit: Payer: Medicaid Other | Admitting: Licensed Clinical Social Worker

## 2022-06-07 DIAGNOSIS — F419 Anxiety disorder, unspecified: Secondary | ICD-10-CM

## 2022-06-07 MED ORDER — HYDROCODONE-ACETAMINOPHEN 5-325 MG PO TABS: ORAL_TABLET | ORAL | 0 refills | Status: DC

## 2022-06-07 NOTE — Telephone Encounter (Signed)
Patient called multiple times while we we were closed leaving messages requesting a refill on Hydrocodone 5-'325mg'$ , 1 every 6 hours, 11 quantity to be sent to [facility] on [address].

## 2022-06-07 NOTE — Patient Instructions (Signed)
Visit Information  Judy Cross was given information about Medicaid Managed Care team care coordination services as a part of their [place] Medicaid benefit. Judy Cross verbally consented to engagement with the [facility] Managed Care team.   If you are experiencing a medical emergency, please call 911 or report to your local emergency department or urgent care.   If you have a non-emergency medical problem during routine business hours, please contact your provider's office and ask to speak with a nurse.   For questions related to your [facility], please call: [phone] or visit the homepage here: [email or web address]  If you would like to schedule transportation through your [facility], please call the following number at least 2 days in advance of your appointment: [phone]   Rides for urgent appointments can also be made after hours by calling Member Services.  Call the [place] at [phone], at any time, 24 hours a day, 7 days a week. If you are in danger or need immediate medical attention call 911.  If you would like help to quit smoking, call 1-800-QUIT-NOW 367 666 4804) OR Espaol: 1-855-Djelo-Ya (0-932-671-2458) o para ms informacin haga clic aqu or Text READY to 200-400 to register via text   Following is a copy of your plan of care:  Care Plan : LCSW Plan of Care  Updates made by Greg Cutter, LCSW since 06/07/2022 12:00 AM     Problem: Coping Skills for symptoms of Anxiety      Goal: Coping Skills Enhanced by connecting for ongoing therapy   Start Date: 07/20/2021  Recent Progress: On track  Priority: High  Note:   Current Barriers:  Disease Management support and education needs related to Anxiety with Panic Symptoms, and Stress at home  CSW Clinical Goal(s):  Patient  will work with [facility] LCSW to  address needs related to managing symptoms of anxiety  through collaboration with Clinical Social Worker, provider, and care team Patient will gain counseling and psychiatry services to help increase mental health support  10 LITTLE Things To Do When You're Feeling Too Down To Do Anything  Take a shower. Even if you plan to stay in all day long and not see a soul, take a shower. It takes the most effort to hop in to the shower but once you do, you'll feel immediate results. It will wake you up and you'll be feeling much fresher (and cleaner too).  Brush and floss your teeth. Give your teeth a good brushing with a floss finish. It's a small task but it feels so good and you can check 'taking care of your health' off the list of things to do.  Do something small on your list. Most of [place] have some small thing we would like to get done (load of laundry, sew a button, email a friend). Doing one of these things will make you feel like you've accomplished something.  Drink water. Drinking water is easy right? It's also really beneficial for your health so keep a glass beside you all day and take sips often. It gives you energy and prevents you from boredom eating.  Do some floor exercises. The last thing you want to do is exercise but it might be just the thing you need the most. Keep it simple and do exercises that involve sitting or laying on the floor. Even the smallest of exercises release chemicals in the brain that make you feel good.  Yoga stretches or core exercises are going to make you feel good with minimal effort.  Make your bed. Making your bed takes a few minutes but it's productive and you'll feel relieved when it's done. An unmade bed is a huge visual reminder that you're having an unproductive day. Do it and consider it your housework for the day.  Put on some nice clothes. Take the sweatpants off even if you don't plan to go anywhere. Put on clothes that make you feel good. Take a  look in the mirror so your brain recognizes the sweatpants have been replaced with clothes that make you look great. It's an instant confidence booster.  Wash the dishes. A pile of dirty dishes in the sink is a reflection of your mood. It's possible that if you wash up the dishes, your mood will follow suit. It's worth a try.  Cook a real meal. If you have the luxury to have a "do nothing" day, you have time to make a real meal for yourself. Make a meal that you love to eat. The process is good to get you out of the funk and the food will ensure you have more energy for tomorrow.  Write out your thoughts by hand. When you hand write, you stimulate your brain to focus on the moment that you're in so make yourself comfortable and write whatever comes into your mind. Put those thoughts out on paper so they stop spinning around in your head. Those thoughts might be the very thing holding you down.

## 2022-06-07 NOTE — Patient Outreach (Signed)
Medicaid Managed Care Social Work Note  06/07/2022 Name:  Judy Cross MRN:  [id number] DOB:  [birth year]  Judy Cross is an 56 y.o. year old female who is a primary patient of Lindell Spar, MD.  The Medicaid Managed Care Coordination team was consulted for assistance with:  [place] and Resources  Ms. Perlow was given information about Medicaid Managed Care Coordination team services today. Judy Cross Patient agreed to services and verbal consent obtained.  Engaged with patient  for by telephone forinitial visit in response to referral for case management and/or care coordination services.   Assessments/Interventions:  Review of past medical history, allergies, medications, health status, including review of consultants reports, laboratory and other test data, was performed as part of comprehensive evaluation and provision of chronic care management services.  SDOH: (Social Determinant of Health) assessments and interventions performed: SDOH Interventions    Flowsheet Row Patient Outreach Telephone from 06/07/2022 in [place] Telephone from 05/30/2022 in [place] Patient Outreach Telephone from 08/26/2021 in [place] Patient Outreach Telephone from 07/20/2021 in [place] Coordination  SDOH Interventions      Food Insecurity Interventions -- -- -- Intervention Not Indicated  Transportation Interventions -- Intervention Not Indicated -- --  Depression Interventions/Treatment  -- -- Counseling --  Financial Strain Interventions -- --  [Referred to [employer] [facility]] -- Other (Comment)  [discussed options]  Stress Interventions Offered [employer], Provide Counseling -- Provide Counseling Provide Counseling       Advanced Directives Status:  See Care Plan for related entries.  Care Plan                  Allergies  Allergen Reactions   Lisinopril Swelling    swelling to entire mouth.   Penicillins Rash    Has patient had a PCN reaction causing immediate rash, facial/tongue/throat swelling, SOB or lightheadedness with hypotension: no - next day Has patient had a PCN reaction causing severe rash involving mucus membranes or skin necrosis: No Has patient had a PCN reaction that required hospitalization No Has patient had a PCN reaction occurring within the last 10 years: Yes If all of the above answers are "NO", then may proceed with Cephalosporin use.    Tramadol Other (See Comments)    dizziness    Medications Reviewed Today     Reviewed by [name], MD (Physician) on 05/31/22 at [place] List Status: <None>   Medication Order Taking? Sig Documenting Provider Last Dose Status Informant  albuterol (PROAIR HFA) 108 (90 Base) MCG/ACT inhaler [id number] No INHALE 1-2 PUFFS BY MOUTH EVERY 6 HOURS AS NEEDED FOR WHEEZE OR SHORTNESS OF BREATHMNT  Patient not taking: Reported on 05/30/2022   Judy Cross, [place] Not Taking Active            Med Note Judy Cross, CATHERINE T   Tue May 30, 2022  3:27 PM)    cyclobenzaprine (FLEXERIL) 10 MG tablet [id number] No Take 1 tablet (10 mg total) by mouth 2 (two) times daily as needed for muscle spasms.  Patient not taking: Reported on 05/30/2022   Lindell Spar, MD Not Taking Active Self  hydrochlorothiazide (HYDRODIURIL) 25 MG tablet [id number] No Take 1 tablet (25 mg total) by mouth daily.  Patient not taking: Reported on 05/30/2022   Lindell Spar, MD Not Taking Active   HYDROcodone-acetaminophen (NORCO/VICODIN) 5-325 MG tablet [id number] No  One tablet every six hours for pain.  Limit 5 days.  Patient not taking: Reported on 05/30/2022   [name], MD Not Taking Active   ibuprofen (ADVIL) 600 MG tablet [id number] No Take 1 tablet (600 mg total) by mouth every 6 (six) hours as needed.  Patient not taking: Reported on 05/30/2022   Hendricks Limes, PA-C Not Taking Active   levETIRAcetam (KEPPRA) 750 MG tablet [id number] No Take 1 tablet (750 mg total) by mouth 2 (two) times daily.  Patient not taking: Reported on 05/30/2022   Alric Ran, MD Not Taking Active   levocetirizine (XYZAL) 5 MG tablet [id number] No Take 1 tablet (5 mg total) by mouth every evening.  Patient not taking: Reported on 05/30/2022   Lindell Spar, MD Not Taking Active   naproxen (NAPROSYN) 500 MG tablet [id number] No Take 1 tablet (500 mg total) by mouth 2 (two) times daily with a meal.  Patient not taking: Reported on 05/30/2022   [name], MD Not Taking Active Self  omeprazole (PRILOSEC) 20 MG capsule [id number] No Take 1 capsule (20 mg total) by mouth daily.  Patient not taking: Reported on 05/30/2022   Judy Belling, MD Not Taking Active   potassium chloride (KLOR-CON M) 10 MEQ tablet [id number] No Take 1 tablet (10 mEq total) by mouth daily.  Patient not taking: Reported on 05/30/2022   Lindell Spar, MD Not Taking Active   predniSONE (DELTASONE) 10 MG tablet [id number] No 6, 5, 4, 3, 2 then 1 tablet by mouth daily for 6 days total.  Patient not taking: Reported on 05/30/2022   Evalee Jefferson, PA-C Not Taking Active   sertraline (ZOLOFT) 25 MG tablet [id number] No Take 1 tablet (25 mg total) by mouth daily.  Patient not taking: Reported on 05/30/2022   Lindell Spar, MD Not Taking Active   Vitamin D, Ergocalciferol, (DRISDOL) 1.25 MG (50000 UNIT) CAPS capsule [id number] No Take 1 capsule (50,000 Units total) by mouth every 7 (seven) days.  Patient not taking: Reported on 05/30/2022   Lindell Spar, MD Not Taking Active             Patient Active Problem List   Diagnosis Date Noted   Hypokalemia 03/22/2022   Vitamin D deficiency 03/22/2022   Fall 03/22/2022   Current moderate episode of major depressive disorder without prior episode ([place]) 03/21/2022   Tobacco abuse 01/06/2022   Spinal stenosis of lumbar region 06/17/2021    Mass of lower outer quadrant of left breast 05/09/2021   Arthritis of left knee 12/02/2020   Chronic left shoulder pain 12/02/2020   Allergic sinusitis 12/02/2020   Mass of left finger    Anxiety 06/12/2019   Seizure disorder ([place]) 06/12/2019   Environmental and seasonal allergies 06/12/2019   Abnormal Pap smear of cervix 05/27/2019   Chronic midline low back pain with left-sided sciatica 01/12/2017   Essential hypertension 12/07/2016   Mild intermittent asthma without complication 06/20/3233    Conditions to be addressed/monitored per PCP order:  [place] : LCSW Plan of Care  Updates made by Greg Cutter, LCSW since 06/07/2022 12:00 AM     Problem: Coping Skills for symptoms of Anxiety      Goal: Coping Skills Enhanced by connecting for ongoing therapy   Start Date: 07/20/2021  Recent Progress: On track  Priority: High  Note:   Current Barriers:  Disease Management support and education needs related to Anxiety with Panic Symptoms, and Stress  at home  CSW Clinical Goal(s):  Patient  will work with [facility] LCSW to address needs related to managing symptoms of anxiety  through collaboration with Clinical Social Worker, provider, and care team Patient will gain counseling and psychiatry services to help increase mental health support  Interventions: Inter-disciplinary care team collaboration (see longitudinal plan of care) Evaluation of current treatment plan related to self management and patient's adherence to plan as established by provider Review resources, discussed options and provided patient information about  Department of Social Services ( currently receiving services ) Transportation provided by insurance provider (currently using) Discussed therapy with options based on need and insurance  BSW completed a telephone outreach with patient, she states she is not working and does not have any income. Patient states she is in need of resources for everything. BSW  will mail patient a list of resources.  Mental Health:  (Status: New goal.) Evaluation of current treatment plan related to Anxiety with Panic Symptoms, Solution-Focused Strategies employed:  Mindfulness or Relaxation training provided Active listening / Reflection utilized  Emotional Support Provided Quality of sleep assessed & Sleep Hygiene techniques promoted  Provided EMMI education information on relieving stress, and insomnia getting a good night's rest Suicidal Ideation/Homicidal Ideation assessed: denied Collaborated with [facility] in [place], [place] [phone]. Made referral to [facility]( patient has appointment 07/22/2021. Patient was unable to attend this appointment due to a family emergency. She has not rescheduled this appointment yet but will do so today. 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 Patient reports that she has already completed her UDS, TB test and Criminal Background. UPDATE- New mental health referrals placed for psychiatry and counseling to [facility] at [place].  Patient reports that she is implementing appropriate boundaries to implement negative people, places and things. Patient works on her breathing exercises daily.  Patient denies any SI/HI. Patient reports that she continues to experience signs of grief but was able to turn it around and even let a balloon go in remembrance of her father. Positive reinforcement provided.   LCSW provided education on relaxation techniques such as meditation, deep breathing, massage, grounding exercsies or yoga that can activate the body's relaxation response and ease symptoms of stress and anxiety. LCSW ask that when pt is struggling with difficult emotions and racing thoughts that they start this relaxation response process. LCSW provided extensive education on  healthy coping skills for anxiety. SW used active and reflective listening, validated patient's feelings/concerns, and provided emotional support.  Patient Self-Care Activities: Practice relaxed breathing 3 times a day     24- Hour Availability:    [facility]  [address] [place], [place] [facility] [phone]   [facility] [employer] [place]  [phone]    [place]  [phone] (after hours)   Therapeutic Alternative/Mobile Crisis   [phone]   [place] National Suicide Hotline  231-684-1817 (TALK) OR 988   Call 911 or go to emergency room   [facility]  [phone]);  [place] and [employer]  [phone]); [place], [place], [place], [place], [place], [place], [place]        10 LITTLE Things To Do When You're Feeling Too Down To Do Anything  Take a shower. Even if you  plan to stay in all day long and not see a soul, take a shower. It takes the most effort to hop in to the shower but once you do, you'll feel immediate results. It will wake you up and you'll be feeling much fresher (and cleaner too).  Brush and floss your teeth. Give your teeth a good brushing with a floss finish. It's a small task but it feels so good and you can check 'taking care of your health' off the list of things to do.  Do something small on your list. Most of [place] have some small thing we would like to get done (load of laundry, sew a button, email a friend). Doing one of these things will make you feel like you've accomplished something.  Drink water. Drinking water is easy right? It's also really beneficial for your health so keep a glass beside you all day and take sips often. It gives you energy and prevents you from boredom eating.  Do some floor exercises. The last thing you want to do is exercise but it might be just the thing  you need the most. Keep it simple and do exercises that involve sitting or laying on the floor. Even the smallest of exercises release chemicals in the brain that make you feel good. Yoga stretches or core exercises are going to make you feel good with minimal effort.  Make your bed. Making your bed takes a few minutes but it's productive and you'll feel relieved when it's done. An unmade bed is a huge visual reminder that you're having an unproductive day. Do it and consider it your housework for the day.  Put on some nice clothes. Take the sweatpants off even if you don't plan to go anywhere. Put on clothes that make you feel good. Take a look in the mirror so your brain recognizes the sweatpants have been replaced with clothes that make you look great. It's an instant confidence booster.  Wash the dishes. A pile of dirty dishes in the sink is a reflection of your mood. It's possible that if you wash up the dishes, your mood will follow suit. It's worth a try.  Cook a real meal. If you have the luxury to have a "do nothing" day, you have time to make a real meal for yourself. Make a meal that you love to eat. The process is good to get you out of the funk and the food will ensure you have more energy for tomorrow.  Write out your thoughts by hand. When you hand write, you stimulate your brain to focus on the moment that you're in so make yourself comfortable and write whatever comes into your mind. Put those thoughts out on paper so they stop spinning around in your head. Those thoughts might be the very thing holding you down.      Follow up:  Patient agrees to Care Plan and Follow-up.  Plan: The Managed Medicaid care management team will reach out to the patient again over the next 30 days.  Date/time of next scheduled Social Work care management/care coordination outreach:  06/16/22 at [place], [place], MSW, [place] Medicaid LCSW [email or web address] Phone: [phone]

## 2022-06-07 NOTE — Telephone Encounter (Signed)
Patient called multiple times while we we were closed leaving messages requesting a refill on

## 2022-06-07 NOTE — Telephone Encounter (Signed)
Inadvertently sent to Dr.Keeling who is out of the office this week. Resent to [place] who is in the office today.

## 2022-06-13 ENCOUNTER — Telehealth: Payer: Self-pay | Admitting: Orthopaedic Surgery

## 2022-06-13 MED ORDER — HYDROCODONE-ACETAMINOPHEN 5-325 MG PO TABS: ORAL_TABLET | ORAL | 0 refills | Status: DC

## 2022-06-13 NOTE — Telephone Encounter (Signed)
Patient called and requested a refill on Hydrocodone 5-325 to be sent to [facility] on [address].  Pt's # [phone]

## 2022-06-16 ENCOUNTER — Encounter: Payer: Self-pay | Admitting: *Deleted

## 2022-06-16 ENCOUNTER — Other Ambulatory Visit: Payer: Medicaid Other | Admitting: Licensed Clinical Social Worker

## 2022-06-16 ENCOUNTER — Other Ambulatory Visit: Payer: Medicaid Other | Admitting: *Deleted

## 2022-06-16 NOTE — Patient Outreach (Signed)
Care Coordination  06/16/2022  Judy Cross 1965-11-05 [id number]   Successful telephone outreach with Judy Cross today. Judy Cross has not picked up her medications, reports that with [employer] [facility] she can only [facility]. [facility] of patients prescribed medications are sent to [facility]. This RNCM advised patient to contact [employer] [facility] to insure she has active coverage. If this is the case, she will need to have her medications transferred [facility] the nearest [facility]. Patient voiced understanding and will do so after this call. RNCM will follow up with patient in 2 weeks to insure that she does infact have [employer] [facility] insurance coverage and not [facility] [employer] plan and that she has received her medications.   Lurena Joiner RN, BSN [place]  [employer] manager

## 2022-06-16 NOTE — Patient Instructions (Signed)
Visit Information  Ms. Boss was given information about Medicaid Managed Care team care coordination services as a part of their [place] Medicaid benefit. Nichole ANNAMAE SHIVLEY verbally consented to engagement with the [facility] Managed Care team.   If you are experiencing a medical emergency, please call 911 or report to your local emergency department or urgent care.   If you have a non-emergency medical problem during routine business hours, please contact your provider's office and ask to speak with a nurse.   For questions related to your Judy Cross, please call: [phone] or visit the homepage here: [email or web address]  If you would like to schedule transportation through your [facility], please call the following number at least 2 days in advance of your appointment: [phone]   Rides for urgent appointments can also be made after hours by calling Member Services.  Call the [place] at [phone], at any time, 24 hours a day, 7 days a week. If you are in danger or need immediate medical attention call 911.  If you would like help to quit smoking, call 1-800-QUIT-NOW 484-587-7657) OR Espaol: 1-855-Djelo-Ya (6-659-935-7017) o para ms informacin haga clic aqu or Text READY to 200-400 to register via text  Following is a copy of your plan of care:  Care Plan : LCSW Plan of Care  Updates made by Greg Cutter, LCSW since 06/16/2022 12:00 AM     Problem: Coping Skills for symptoms of Anxiety      Goal: Coping Skills Enhanced by connecting for ongoing therapy   Start Date: 07/20/2021  Recent Progress: On track  Priority: High  Note:   Current Barriers:  Disease Management support and education needs related to Anxiety with Panic Symptoms, and Stress at home  CSW Clinical Goal(s):  Patient  will work with [facility] LCSW to  address needs related to managing symptoms of anxiety  through collaboration with Clinical Social Worker, provider, and care team Patient will gain counseling and psychiatry services to help increase mental health support  10 LITTLE Things To Do When You're Feeling Too Down To Do Anything  Take a shower. Even if you plan to stay in all day long and not see a soul, take a shower. It takes the most effort to hop in to the shower but once you do, you'll feel immediate results. It will wake you up and you'll be feeling much fresher (and cleaner too).  Brush and floss your teeth. Give your teeth a good brushing with a floss finish. It's a small task but it feels so good and you can check 'taking care of your health' off the list of things to do.  Do something small on your list. Most of [place] have some small thing we would like to get done (load of laundry, sew a button, email a friend). Doing one of these things will make you feel like you've accomplished something.  Drink water. Drinking water is easy right? It's also really beneficial for your health so keep a glass beside you all day and take sips often. It gives you energy and prevents you from boredom eating.  Do some floor exercises. The last thing you want to do is exercise but it might be just the thing you need the most. Keep it simple and do exercises that involve sitting or laying on the floor. Even the smallest of exercises release chemicals in the brain that make you feel good. Yoga  stretches or core exercises are going to make you feel good with minimal effort.  Make your bed. Making your bed takes a few minutes but it's productive and you'll feel relieved when it's done. An unmade bed is a huge visual reminder that you're having an unproductive day. Do it and consider it your housework for the day.  Put on some nice clothes. Take the sweatpants off even if you don't plan to go anywhere. Put on clothes that make you feel good. Take a  look in the mirror so your brain recognizes the sweatpants have been replaced with clothes that make you look great. It's an instant confidence booster.  Wash the dishes. A pile of dirty dishes in the sink is a reflection of your mood. It's possible that if you wash up the dishes, your mood will follow suit. It's worth a try.  Cook a real meal. If you have the luxury to have a "do nothing" day, you have time to make a real meal for yourself. Make a meal that you love to eat. The process is good to get you out of the funk and the food will ensure you have more energy for tomorrow.  Write out your thoughts by hand. When you hand write, you stimulate your brain to focus on the moment that you're in so make yourself comfortable and write whatever comes into your mind. Put those thoughts out on paper so they stop spinning around in your head. Those thoughts might be the very thing holding you down.

## 2022-06-16 NOTE — Patient Outreach (Signed)
Medicaid Managed Care Social Work Note  06/16/2022 Name:  Judy Cross MRN:  [id number] DOB:  [birth year]  Judy Cross is an 57 y.o. year old female who is a primary patient of Judy Spar, MD.  The Medicaid Managed Care Coordination team was consulted for assistance with:  [place] and Resources  Judy Cross was given information about Medicaid Managed Care Coordination team services today. Judy Cross Patient agreed to services and verbal consent obtained.  Engaged with patient  for by telephone forfollow up visit in response to referral for case management and/or care coordination services.   Assessments/Interventions:  Review of past medical history, allergies, medications, health status, including review of consultants reports, laboratory and other test data, was performed as part of comprehensive evaluation and provision of chronic care management services.  SDOH: (Social Determinant of Health) assessments and interventions performed: SDOH Interventions    Flowsheet Row Patient Outreach Telephone from 06/16/2022 in [place] Patient Outreach Telephone from 06/07/2022 in [place] Telephone from 05/30/2022 in [place] Patient Outreach Telephone from 08/26/2021 in [place] Patient Outreach Telephone from 07/20/2021 in [place] Coordination  SDOH Interventions       Food Insecurity Interventions -- -- -- -- Intervention Not Indicated  Transportation Interventions Intervention Not Indicated -- Intervention Not Indicated -- --  Depression Interventions/Treatment  -- -- -- Counseling --  Financial Strain Interventions -- -- --  [Referred to [facility]] -- Other (Comment)  [discussed options]  Stress Interventions -- [employer], Provide Counseling -- Provide Counseling  Provide Counseling       Advanced Directives Status:  See Care Plan for related entries.  Care Plan                 Allergies  Allergen Reactions   Lisinopril Swelling    swelling to entire mouth.   Penicillins Rash    Has patient had a PCN reaction causing immediate rash, facial/tongue/throat swelling, SOB or lightheadedness with hypotension: no - next day Has patient had a PCN reaction causing severe rash involving mucus membranes or skin necrosis: No Has patient had a PCN reaction that required hospitalization No Has patient had a PCN reaction occurring within the last 10 years: Yes If all of the above answers are "NO", then may proceed with Cephalosporin use.    Tramadol Other (See Comments)    dizziness    Medications Reviewed Today     Reviewed by Melissa Montane, RN (Registered Nurse) on 06/16/22 at [place] List Status: <None>   Medication Order Taking? Sig Documenting Provider Last Dose Status Informant  HYDROcodone-acetaminophen (NORCO/VICODIN) 5-325 MG tablet [id number]  One tablet every six hours for pain.  Limit 5 days. [name], MD  Active             Patient Active Problem List   Diagnosis Date Noted   Hypokalemia 03/22/2022   Vitamin D deficiency 03/22/2022   Fall 03/22/2022   Current moderate episode of major depressive disorder without prior episode ([place]) 03/21/2022   Tobacco abuse 01/06/2022   Spinal stenosis of lumbar region 06/17/2021   Mass of lower outer quadrant of left breast 05/09/2021   Arthritis of left knee 12/02/2020   Chronic left shoulder pain 12/02/2020   Allergic sinusitis 12/02/2020   Mass of left finger    Anxiety 06/12/2019   Seizure disorder (  [place]) 06/12/2019   Environmental and seasonal allergies 06/12/2019   Abnormal Pap smear of cervix 05/27/2019   Chronic midline low back pain with left-sided sciatica 01/12/2017   Essential hypertension 12/07/2016   Mild intermittent asthma without complication 95/02/3266     Conditions to be addressed/monitored per PCP order:  [place] : LCSW Plan of Care  Updates made by Greg Cutter, LCSW since 06/16/2022 12:00 AM     Problem: Coping Skills for symptoms of Anxiety      Goal: Coping Skills Enhanced by connecting for ongoing therapy   Start Date: 07/20/2021  Recent Progress: On track  Priority: High  Note:   Current Barriers:  Disease Management support and education needs related to Anxiety with Panic Symptoms, and Stress at home  CSW Clinical Goal(s):  Patient  will work with [facility] LCSW to address needs related to managing symptoms of anxiety  through collaboration with Clinical Social Worker, provider, and care team Patient will gain counseling and psychiatry services to help increase mental health support  Interventions: Inter-disciplinary care team collaboration (see longitudinal plan of care) Evaluation of current treatment plan related to self management and patient's adherence to plan as established by provider Review resources, discussed options and provided patient information about  Department of Social Services ( currently receiving services ) Transportation provided by insurance provider (currently using) Discussed therapy with options based on need and insurance  BSW completed a telephone outreach with patient, she states she is not working and does not have any income. Patient states she is in need of resources for everything. BSW will mail patient a list of resources.  Mental Health:  (Status: New goal.) Evaluation of current treatment plan related to Anxiety with Panic Symptoms, Solution-Focused Strategies employed:  Mindfulness or Relaxation training provided Active listening / Reflection utilized  Emotional Support Provided Quality of sleep assessed & Sleep Hygiene techniques promoted  Provided EMMI education information on relieving stress, and insomnia getting a good night's rest Suicidal Ideation/Homicidal Ideation  assessed: denied Collaborated with [facility] in [place], [place] [phone]. Made referral to [facility]( patient has appointment 07/22/2021. Patient was unable to attend this appointment due to a family emergency. She has not rescheduled this appointment yet but will do so today. 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 Patient reports that she has already completed her UDS, TB test and Criminal Background. Update New mental health referrals placed for psychiatry and counseling to [facility] at [place]. 06/16/22 Update- Patient reports that she has not received a call from [facility] at [place]. One of the two referrals that [facility] LCSW made is denied. Patient is agreeable to contact agency next week (they close early on Fridays) to make sure she gets scheduled for her initial sessions. Emotional support provided. Mental Health resource contact information provided as well.  Patient reports that she is implementing appropriate boundaries to implement negative people, places and things. Patient works on her breathing exercises daily.  Patient denies any SI/HI. Patient reports that she continues to experience signs of grief but was able to turn it around and even let a balloon go in remembrance of her father. Positive reinforcement provided.   LCSW provided education on relaxation techniques such as meditation, deep breathing, massage, grounding exercsies or yoga that can activate the body's relaxation response and ease  symptoms of stress and anxiety. LCSW ask that when pt is struggling with difficult emotions and racing thoughts that they start this relaxation response process. LCSW provided extensive education on healthy coping skills for anxiety. SW used active and reflective listening, validated patient's feelings/concerns, and provided  emotional support.  Patient Self-Care Activities: Practice relaxed breathing 3 times a day     24- Hour Availability:    [facility]  [address] [place], [place] Crisis [phone]   [facility] [employer] [place]  [phone]    [place]  [phone] (after hours)   Therapeutic Alternative/Mobile Crisis   [phone]   [place] National Suicide Hotline  450-454-9479 (TALK) OR 988   Call 911 or go to emergency room   [facility]  [phone]);  [place] and [employer]  [phone]); [place], [place], [place], [place], [place], [place], [place]        10 LITTLE Things To Do When You're Feeling Too Down To Do Anything  Take a shower. Even if you plan to stay in all day long and not see a soul, take a shower. It takes the most effort to hop in to the shower but once you do, you'll feel immediate results. It will wake you up and you'll be feeling much fresher (and cleaner too).  Brush and floss your teeth. Give your teeth a good brushing with a floss finish. It's a small task but it feels so good and you can check 'taking care of your health' off the list of things to do.  Do something small on your list. Most of [place] have some small thing we would like to get done (load of laundry, sew a button, email a friend). Doing one of these things will make you feel like you've accomplished something.  Drink water. Drinking water is easy right? It's also really beneficial for your health so keep a glass beside you all day and take sips often. It gives you energy and prevents you from boredom eating.  Do some floor exercises. The last thing you want to do is exercise but it might be just the thing you need the most. Keep it simple and do exercises that involve sitting or laying on the floor. Even the smallest of exercises  release chemicals in the brain that make you feel good. Yoga stretches or core exercises are going to make you feel good with minimal effort.  Make your bed. Making your bed takes a few minutes but it's productive and you'll feel relieved when it's done. An unmade bed is a huge visual reminder that you're having an unproductive day. Do it and consider it your housework for the day.  Put on some nice clothes. Take the sweatpants off even if you don't plan to go anywhere. Put on clothes that make you feel good. Take a look in the mirror so your brain recognizes the sweatpants have been replaced with clothes that make you look great. It's an instant confidence booster.  Wash the dishes. A pile of dirty dishes in the sink is a reflection of your mood. It's possible that if you wash up the dishes, your mood will follow suit. It's worth a try.  Cook a real meal. If you have the luxury to have a "do nothing" day, you have time to make a real meal for yourself. Make a meal that you love to  eat. The process is good to get you out of the funk and the food will ensure you have more energy for tomorrow.  Write out your thoughts by hand. When you hand write, you stimulate your brain to focus on the moment that you're in so make yourself comfortable and write whatever comes into your mind. Put those thoughts out on paper so they stop spinning around in your head. Those thoughts might be the very thing holding you down.       Follow up:  Patient agrees to Care Plan and Follow-up.  Plan: The Managed Medicaid care management team will reach out to the patient again over the next 30 days.  Date/time of next scheduled Social Work care management/care coordination outreach:  06/26/22 at 245 pm  Eula Fried, [place], MSW, [place] Medicaid LCSW [email or web address] Phone: [phone]

## 2022-06-20 ENCOUNTER — Telehealth: Payer: Self-pay | Admitting: Orthopaedic Surgery

## 2022-06-20 ENCOUNTER — Ambulatory Visit: Payer: Medicaid Other | Admitting: Orthopaedic Surgery

## 2022-06-20 MED ORDER — HYDROCODONE-ACETAMINOPHEN 5-325 MG PO TABS: ORAL_TABLET | ORAL | 0 refills | Status: DC

## 2022-06-20 NOTE — Telephone Encounter (Signed)
SENT TO PROVIDER

## 2022-06-20 NOTE — Telephone Encounter (Signed)
Patient lvm stating her Hydrocodone 5-325 is due today.  [place].  Pt's # [phone]

## 2022-06-21 ENCOUNTER — Encounter (HOSPITAL_COMMUNITY): Payer: Self-pay | Admitting: Physical Therapy

## 2022-06-21 ENCOUNTER — Ambulatory Visit (HOSPITAL_COMMUNITY): Payer: Medicaid Other | Attending: Orthopedic Surgery | Admitting: Physical Therapy

## 2022-06-21 DIAGNOSIS — M5459 Other low back pain: Secondary | ICD-10-CM | POA: Diagnosis present

## 2022-06-21 DIAGNOSIS — G8929 Other chronic pain: Secondary | ICD-10-CM | POA: Diagnosis not present

## 2022-06-21 DIAGNOSIS — R2689 Other abnormalities of gait and mobility: Secondary | ICD-10-CM | POA: Insufficient documentation

## 2022-06-21 DIAGNOSIS — M6281 Muscle weakness (generalized): Secondary | ICD-10-CM | POA: Insufficient documentation

## 2022-06-21 DIAGNOSIS — M25562 Pain in left knee: Secondary | ICD-10-CM | POA: Diagnosis present

## 2022-06-21 DIAGNOSIS — R29898 Other symptoms and signs involving the musculoskeletal system: Secondary | ICD-10-CM | POA: Diagnosis present

## 2022-06-21 NOTE — Therapy (Signed)
OUTPATIENT PHYSICAL THERAPY LOWER EXTREMITY EVALUATION   Patient Name: LAPORSCHE HOEGER MRN: [id number] DOB:1965/08/15, 57 y.o., female Today's Date: 06/21/2022  END OF SESSION:  PT End of Session - 06/21/22 1037     Visit Number 1    Number of Visits 12    Date for PT Re-Evaluation 08/02/22    Authorization Type [place] Medicaid [employer]HC    Authorization Time Period 12 visits requested - check auth    Progress Note Due on Visit 10    PT Start Time 1040    PT Stop Time 1116    PT Time Calculation (min) 36 min    Activity Tolerance Patient tolerated treatment well    Behavior During Therapy WFL for tasks assessed/performed             Past Medical History:  Diagnosis Date   Allergy    Anxiety 06/12/2019   Arthritis    Asthma in adult, mild intermittent, uncomplicated 9/32/3557   Breast nodule 10/12/2015   Breast nodule 10/12/2015   Bulge of lumbar disc without myelopathy    Endometrial polyp 04/28/2013   Enlarged uterus 03/26/2013   Fibroids 04/28/2013   Hot flashes 03/26/2013   Hot flashes 03/26/2013   Hypertension    Irregular bleeding 03/26/2013   Neuromuscular disorder ([place])    sciatica   Seasonal allergies    Seizures ([place])    last seizure was 2 years ago; unknown etiology. On Keppra.   Trichomoniasis 10/20/2020   tx 10/20/20  POC___   Past Surgical History:  Procedure Laterality Date   ABLATION     with polyp removal.   BREAST BIOPSY Left    neg   CESAREAN SECTION     x 3   COLONOSCOPY WITH PROPOFOL N/A 01/11/2016   Surgeon: Danie Binder, MD; mild diverticulosis in proximal ascending colon, nonbleeding internal hemorrhoids.  Repeat in 10 years.   EXAMINATION UNDER ANESTHESIA  02/28/2012   Procedure: EXAM UNDER ANESTHESIA;  Surgeon: Donato Heinz, MD;  Location: [facility];  Service: General;  Laterality: N/A;   EXCISION MASS UPPER EXTREMETIES Left 11/13/2019   Procedure: EXCISION MASS UPPER EXTREMETIES ring finger left;  Surgeon: Carole Civil, MD;   Location: [facility];  Service: Orthopedics;  Laterality: Left;   GANGLION CYST EXCISION Left    HYSTEROSCOPY WITH D & C N/A 05/23/2013   Procedure: DILATATION AND CURETTAGE /HYSTEROSCOPY;  Surgeon: Jonnie Kind, MD;  Location: [facility];  Service: Gynecology;  Laterality: N/A;   POLYPECTOMY N/A 05/23/2013   Procedure: ENDOMETRIAL POLYP REMOVAL;  Surgeon: Jonnie Kind, MD;  Location: [facility];  Service: Gynecology;  Laterality: N/A;   SPHINCTEROTOMY  02/28/2012   Procedure: SPHINCTEROTOMY;  Surgeon: Donato Heinz, MD;  Location: [facility];  Service: General;  Laterality: N/A;  Lateral Internal Sphincterotomy   TRIGGER FINGER RELEASE Left    ring finger   TUBAL LIGATION     Patient Active Problem List   Diagnosis Date Noted   Hypokalemia 03/22/2022   Vitamin D deficiency 03/22/2022   Fall 03/22/2022   Current moderate episode of major depressive disorder without prior episode ([place]) 03/21/2022   Tobacco abuse 01/06/2022   Spinal stenosis of lumbar region 06/17/2021   Mass of lower outer quadrant of left breast 05/09/2021   Arthritis of left knee 12/02/2020   Chronic left shoulder pain 12/02/2020   Allergic sinusitis 12/02/2020   Mass of left finger    Anxiety 06/12/2019   Seizure disorder ([place]) 06/12/2019  Environmental and seasonal allergies 06/12/2019   Abnormal Pap smear of cervix 05/27/2019   Chronic midline low back pain with left-sided sciatica 01/12/2017   Essential hypertension 12/07/2016   Mild intermittent asthma without complication 40/98/1191    PCP: Ihor Dow MD  REFERRING PROVIDER: [name], MD  REFERRING DIAG: [phone] (ICD-10-CM) - Chronic pain of left knee  THERAPY DIAG:  Left knee pain, unspecified chronicity  Other low back pain  Muscle weakness (generalized)  Other abnormalities of gait and mobility  Other symptoms and signs involving the musculoskeletal system  Rationale for Evaluation and Treatment: Rehabilitation  ONSET DATE: 5-6  years +  SUBJECTIVE:   SUBJECTIVE STATEMENT: Patient states she has a cyst in it causing pain. She has had to use a brace and SPC. She has been out of work about a year. She has had seizures and had one last month. Pain with walking and at rest, constant pain. Knee gives out on her without the brace.  PERTINENT HISTORY: Cyst in left knee; seizures, anxiety and depression, LBP, herniated disc in back PAIN:  Are you having pain? Yes: NPRS scale: 8/10 Pain location: L knee Pain description: numb Aggravating factors: constant Relieving factors: none  PRECAUTIONS: None  WEIGHT BEARING RESTRICTIONS: No  FALLS:  Has patient fallen in last 6 months? Yes. Number of falls 7  LIVING ENVIRONMENT: Lives with: lives with their family Lives in: House/apartment Stairs: Yes: External: 1 steps; none Has following equipment at home: None  OCCUPATION: unemployed  PLOF: Independent  PATIENT GOALS: get back and leg feeling better  NEXT MD VISIT: Tomorrow  OBJECTIVE:   DIAGNOSTIC FINDINGS: XR 03/10/21 Impression: effusion left knee, mild degenerative joint disease, no fracture noted   PATIENT SURVEYS: LEFS 21/80  COGNITION: Overall cognitive status: Within functional limits for tasks assessed     SENSATION: WFL   POSTURE: rounded shoulders and forward head  PALPATION: TTP R quad, grossly throughout knee  LOWER EXTREMITY ROM:  Active ROM Right eval Left eval  Hip flexion    Hip extension    Hip abduction    Hip adduction    Hip internal rotation    Hip external rotation    Knee flexion 133 119  Knee extension 0 0  Ankle dorsiflexion    Ankle plantarflexion    Ankle inversion    Ankle eversion     (Blank rows = not tested)  LOWER EXTREMITY MMT:  MMT Right eval Left eval  Hip flexion 4+ 4  Hip extension 4+ 4  Hip abduction 4+ 4  Hip adduction    Hip internal rotation    Hip external rotation    Knee flexion 5 4*  Knee extension 5 4*  Ankle dorsiflexion     Ankle plantarflexion    Ankle inversion    Ankle eversion     (Blank rows = not tested) LUMBAR ROM:    Active  AROM  eval   Flexion 0% limited   Extension 25% limited   Right lateral flexion 0% limited    Left lateral flexion  0% limited   Right rotation     Left rotation     (Blank rows = not tested)    FUNCTIONAL TESTS:  5 times sit to stand: 10.3 seconds without UE use, equal bilateral LE use, dynamic knee valgus bilaterally 2 minute walk test: 185 feet  GAIT: Distance walked: 185 feet Assistive device utilized: None Level of assistance: Modified independence Comments: 2MWT, antalgic on L with L knee  flexed in stance, limited L knee flexion extension, intermittent unsteadiness without loss of balance   TODAY'S TREATMENT:                                                                                                                              DATE:  06/21/22 SLR x 10 LAQ x 10 with 5 second holds    PATIENT EDUCATION:  Education details: Patient educated on exam findings, POC, scope of PT, HEP Person educated: Patient Education method: Consulting civil engineer, Demonstration, and Handouts Education comprehension: verbalized understanding, returned demonstration, verbal cues required, and tactile cues required  HOME EXERCISE PROGRAM: Access Code: [email or web address] URL: [email or web address]  Date: 06/21/2022 - Supine Active Straight Leg Raise  - 2-3 x daily - 7 x weekly - 2 sets - 10 reps - Seated Long Arc Quad  - 2-3 x daily - 7 x weekly - 2 sets - 10 reps - 5 second hold  ASSESSMENT:  CLINICAL IMPRESSION: Patient a 57 y.o. y.o. female who was seen today for physical therapy evaluation and treatment for L knee pain. Patient presents with pain limited deficits in L knee strength, ROM, endurance, activity tolerance, gait, balance and functional mobility with ADL. Patient is having to modify and restrict ADL as indicated by outcome measure score as well as subjective  information and objective measures which is affecting overall participation. Patient will benefit from skilled physical therapy in order to improve function and reduce impairment.  OBJECTIVE IMPAIRMENTS: Abnormal gait, decreased activity tolerance, decreased balance, decreased endurance, decreased mobility, difficulty walking, decreased ROM, decreased strength, increased muscle spasms, impaired flexibility, improper body mechanics, and pain.   ACTIVITY LIMITATIONS: carrying, lifting, bending, standing, squatting, stairs, transfers, locomotion level, and caring for others  PARTICIPATION LIMITATIONS: meal prep, cleaning, laundry, shopping, community activity, occupation, and yard work  PERSONAL FACTORS: Fitness, Time since onset of injury/illness/exacerbation, and 3+ comorbidities: Cyst in left knee; seizures, anxiety and depression, LBP, herniated disc in back  are also affecting patient's functional outcome.   REHAB POTENTIAL: Good  CLINICAL DECISION MAKING: Stable/uncomplicated  EVALUATION COMPLEXITY: Low   GOALS: Goals reviewed with patient? Yes  SHORT TERM GOALS: Target date: 07/12/2022    Patient will be independent with HEP in order to improve functional outcomes. Baseline: Goal status: INITIAL  2.  Patient will report at least 25% improvement in symptoms for improved quality of life. Baseline: Goal status: INITIAL    LONG TERM GOALS: Target date: 08/02/22  Patient will report at least 75% improvement in symptoms for improved quality of life. Baseline:  Goal status: INITIAL  2.  Patient will improve FOTO score by at least 9 points in order to indicate improved tolerance to activity. Baseline: 21/80 Goal status: INITIAL  3.  Patient will be able to navigate stairs with reciprocal pattern without compensation in order to demonstrate improved LE strength. Baseline: unable Goal status: INITIAL  4.  Patient will be able to ambulate at least 226 feet in  2MWT in order to  demonstrate improved tolerance to activity. Baseline: 135 feet Goal status: INITIAL  5.  Patient will demonstrate grade of 5/5 MMT grade in all tested musculature as evidence of improved strength to assist with stair ambulation and gait.   Baseline: see above Goal status: INITIAL   PLAN:  PT FREQUENCY: 2x/week  PT DURATION: 6 weeks  PLANNED INTERVENTIONS: Therapeutic exercises, Therapeutic activity, Neuromuscular re-education, Balance training, Gait training, Patient/Family education, Joint manipulation, Joint mobilization, Stair training, Orthotic/Fit training, DME instructions, Aquatic Therapy, Dry Needling, Electrical stimulation, Spinal manipulation, Spinal mobilization, Cryotherapy, Moist heat, Compression bandaging, scar mobilization, Splintting, Taping, Traction, Ultrasound, Ionotophoresis '4mg'$ /ml Dexamethasone, and Manual therapy  PLAN FOR NEXT SESSION: quad strength, glute strength, core strength; f/u with HEP, progress as able, gait/balance training   Mearl Latin, PT 06/21/2022, 11:20 AM

## 2022-06-22 ENCOUNTER — Other Ambulatory Visit: Payer: Medicaid Other

## 2022-06-22 ENCOUNTER — Ambulatory Visit (INDEPENDENT_AMBULATORY_CARE_PROVIDER_SITE_OTHER): Payer: Medicaid Other | Admitting: Neurology

## 2022-06-22 DIAGNOSIS — G40909 Epilepsy, unspecified, not intractable, without status epilepticus: Secondary | ICD-10-CM | POA: Diagnosis not present

## 2022-06-22 NOTE — Patient Instructions (Signed)
  Medicaid Managed Care   Unsuccessful Outreach Note  06/22/2022 Name: Judy Cross MRN: [id number] DOB: April 08, 1966  Referred by: Lindell Spar, MD Reason for referral : High Risk Managed Medicaid (MM Social work unsuccessful telephone outreach )   An unsuccessful telephone outreach was attempted today. The patient was referred to the case management team for assistance with care management and care coordination.   Follow Up Plan: The patient has been provided with contact information for the care management team and has been advised to call with any health related questions or concerns.   Mickel Fuchs, BSW, [place] Managed Medicaid Team  [phone]

## 2022-06-22 NOTE — Patient Outreach (Signed)
  Medicaid Managed Care   Unsuccessful Outreach Note  06/22/2022 Name: Judy Cross MRN: [id number] DOB: 1965-12-31  Referred by: Lindell Spar, MD Reason for referral : High Risk Managed Medicaid (MM Social work unsuccessful telephone outreach )   An unsuccessful telephone outreach was attempted today. The patient was referred to the case management team for assistance with care management and care coordination.   Follow Up Plan: The patient has been provided with contact information for the care management team and has been advised to call with any health related questions or concerns.   Mickel Fuchs, BSW, [place] Managed Medicaid Team  [phone]

## 2022-06-23 NOTE — Progress Notes (Signed)
Please call and inform patient that her recent EEG (Brain wave test) was normal. In particular, there were no epileptiform discharges and no seizures. No further action is required on this test at this time. Please keep any upcoming appointments or tests and  call us with any interim questions, concerns, problems or updates. Thanks,   Alric Ran, MD

## 2022-06-23 NOTE — Procedures (Signed)
    History:  58 year old woman with seizure disorder   EEG classification: Awake and drowsy  Description of the recording: The background rhythms of this recording consists of a fairly well modulated medium amplitude alpha rhythm of 9-10 Hz that is reactive to eye opening and closure. Present in the anterior head region is a 15-20 Hz beta activity. Photic stimulation was performed, did not show any abnormalities. Hyperventilation was also performed, did not show any abnormalities. Drowsiness was manifested by background fragmentation. No abnormal epileptiform discharges seen during this recording. There was no focal slowing. There were no electrographic seizure identified.   Abnormality: None   Impression: This is a normal EEG recorded while drowsy and awake. No evidence of interictal epileptiform discharges. Normal EEGs, however, do not rule out epilepsy.    Alric Ran, MD [facility]

## 2022-06-26 ENCOUNTER — Other Ambulatory Visit: Payer: Medicaid Other | Admitting: Licensed Clinical Social Worker

## 2022-06-26 ENCOUNTER — Telehealth: Payer: Self-pay

## 2022-06-26 NOTE — Patient Instructions (Signed)
Visit Information  Judy Cross was given information about Medicaid Managed Care team care coordination services as a part of their [place] Medicaid benefit. Keosha WESTON FULCO verbally consented to engagement with the [facility] Managed Care team.   If you are experiencing a medical emergency, please call 911 or report to your local emergency department or urgent care.   If you have a non-emergency medical problem during routine business hours, please contact your provider's office and ask to speak with a nurse.   For questions related to your [facility], please call: [phone] or visit the homepage here: [email or web address]  If you would like to schedule transportation through your [facility], please call the following number at least 2 days in advance of your appointment: [phone]   Rides for urgent appointments can also be made after hours by calling Member Services.  Call the [place] at [phone], at any time, 24 hours a day, 7 days a week. If you are in danger or need immediate medical attention call 911.  If you would like help to quit smoking, call 1-800-QUIT-NOW 213-884-4631) OR Espaol: 1-855-Djelo-Ya (8-250-539-7673) o para ms informacin haga clic aqu or Text READY to 200-400 to register via text   Following is a copy of your plan of care:  Care Plan : LCSW Plan of Care  Updates made by Greg Cutter, LCSW since 06/26/2022 12:00 AM     Problem: Coping Skills for symptoms of Anxiety      Goal: Coping Skills Enhanced by connecting for ongoing therapy   Start Date: 07/20/2021  Recent Progress: On track  Priority: High  Note:   Current Barriers:  Disease Management support and education needs related to Anxiety with Panic Symptoms, and Stress at home  CSW Clinical Goal(s):  Patient  will work with [facility] LCSW to  address needs related to managing symptoms of anxiety  through collaboration with Clinical Social Worker, provider, and care team Patient will gain counseling and psychiatry services to help increase mental health support  Interventions: Inter-disciplinary care team collaboration (see longitudinal plan of care) Evaluation of current treatment plan related to self management and patient's adherence to plan as established by provider Review resources, discussed options and provided patient information about  Department of Social Services ( currently receiving services ) Transportation provided by insurance provider (currently using) Discussed therapy with options based on need and insurance  BSW completed a telephone outreach with patient, she states she is not working and does not have any income. Patient states she is in need of resources for everything. BSW will mail patient a list of resources.  Patient Self-Care Activities: Practice relaxed breathing 3 times a day     24- Hour Availability:    [facility]  [address] [place], [place] [facility] [phone]   [facility] of the [employer] [place]  [phone]    [place]  [phone] (after hours)   Therapeutic Alternative/Mobile Crisis   [phone]   [place] National Suicide Hotline  516-658-5178 (TALK) OR 988   Call 911 or go to emergency room   [facility]  [phone]);  [place] and [employer]  [phone]); [place], [place], [place], [place], [place], [place], [place]        10 LITTLE Things To Do When You're Feeling Too Down To Do Anything  Take  a shower. Even if you plan to stay in all day long and not see a soul, take a shower. It takes the most effort to hop in to the shower but once you do, you'll feel immediate results. It will wake you up and you'll be  feeling much fresher (and cleaner too).  Brush and floss your teeth. Give your teeth a good brushing with a floss finish. It's a small task but it feels so good and you can check 'taking care of your health' off the list of things to do.  Do something small on your list. Most of [place] have some small thing we would like to get done (load of laundry, sew a button, email a friend). Doing one of these things will make you feel like you've accomplished something.  Drink water. Drinking water is easy right? It's also really beneficial for your health so keep a glass beside you all day and take sips often. It gives you energy and prevents you from boredom eating.  Do some floor exercises. The last thing you want to do is exercise but it might be just the thing you need the most. Keep it simple and do exercises that involve sitting or laying on the floor. Even the smallest of exercises release chemicals in the brain that make you feel good. Yoga stretches or core exercises are going to make you feel good with minimal effort.  Make your bed. Making your bed takes a few minutes but it's productive and you'll feel relieved when it's done. An unmade bed is a huge visual reminder that you're having an unproductive day. Do it and consider it your housework for the day.  Put on some nice clothes. Take the sweatpants off even if you don't plan to go anywhere. Put on clothes that make you feel good. Take a look in the mirror so your brain recognizes the sweatpants have been replaced with clothes that make you look great. It's an instant confidence booster.  Wash the dishes. A pile of dirty dishes in the sink is a reflection of your mood. It's possible that if you wash up the dishes, your mood will follow suit. It's worth a try.  Cook a real meal. If you have the luxury to have a "do nothing" day, you have time to make a real meal for yourself. Make a meal that you love to eat. The process is good to get you out  of the funk and the food will ensure you have more energy for tomorrow.  Write out your thoughts by hand. When you hand write, you stimulate your brain to focus on the moment that you're in so make yourself comfortable and write whatever comes into your mind. Put those thoughts out on paper so they stop spinning around in your head. Those thoughts might be the very thing holding you down.

## 2022-06-26 NOTE — Telephone Encounter (Signed)
-----  Message from Alric Ran, MD sent at 06/23/2022 10:13 AM EST ----- Please call and inform patient that her recent EEG (Brain wave test) was normal. In particular, there were no epileptiform discharges and no seizures. No further action is required on this test at this time. Please keep any upcoming appointments or tests and  call us with any interim questions, concerns, problems or updates. Thanks,   Alric Ran, MD

## 2022-06-26 NOTE — Patient Outreach (Addendum)
Medicaid Managed Care Social Work Note  06/26/2022 Name:  Judy Cross MRN:  [id number] DOB:  03/29/1966  Judy Cross is an 57 y.o. year old female who is a primary patient of Lindell Spar, MD.  The Medicaid Managed Care Coordination team was consulted for assistance with:  [place] and Resources  Ms. Lacross was given information about Medicaid Managed Care Coordination team services today. [name] Patient agreed to services and verbal consent obtained.  Engaged with patient  for by telephone forfollow up visit in response to referral for case management and/or care coordination services.   Assessments/Interventions:  Review of past medical history, allergies, medications, health status, including review of consultants reports, laboratory and other test data, was performed as part of comprehensive evaluation and provision of chronic care management services.  SDOH: (Social Determinant of Health) assessments and interventions performed: SDOH Interventions    Flowsheet Row Patient Outreach Telephone from 06/26/2022 in [place] Patient Outreach Telephone from 06/16/2022 in [place] Patient Outreach Telephone from 06/07/2022 in [place] Telephone from 05/30/2022 in [place] Patient Outreach Telephone from 08/26/2021 in [place] Patient Outreach Telephone from 07/20/2021 in [place] Coordination  SDOH Interventions        Food Insecurity Interventions -- -- -- -- -- Intervention Not Indicated  Transportation Interventions -- Intervention Not Indicated -- Intervention Not Indicated -- --  Depression Interventions/Treatment  -- -- -- -- Counseling --  Financial Strain Interventions -- -- -- --  [Referred to BSW and Pharmacy] -- Other (Comment)  [discussed options]  Stress  Interventions Offered [employer], Provide Counseling -- [employer], Provide Counseling -- Provide Counseling Provide Counseling       Advanced Directives Status:  See Care Plan for related entries.  Care Plan                 Allergies  Allergen Reactions   Lisinopril Swelling    swelling to entire mouth.   Penicillins Rash    Has patient had a PCN reaction causing immediate rash, facial/tongue/throat swelling, SOB or lightheadedness with hypotension: no - next day Has patient had a PCN reaction causing severe rash involving mucus membranes or skin necrosis: No Has patient had a PCN reaction that required hospitalization No Has patient had a PCN reaction occurring within the last 10 years: Yes If all of the above answers are "NO", then may proceed with Cephalosporin use.    Tramadol Other (See Comments)    dizziness    Medications Reviewed Today     Reviewed by Mearl Latin, PT (Physical Therapist) on 06/21/22 at [place] List Status: <None>   Medication Order Taking? Sig Documenting Provider Last Dose Status Informant  HYDROcodone-acetaminophen (NORCO/VICODIN) 5-325 MG tablet [id number]  One tablet every six hours for pain.  Limit 5 days. [name], MD  Active             Patient Active Problem List   Diagnosis Date Noted   Hypokalemia 03/22/2022   Vitamin D deficiency 03/22/2022   Fall 03/22/2022   Current moderate episode of major depressive disorder without prior episode ([place]) 03/21/2022   Tobacco abuse 01/06/2022   Spinal stenosis of lumbar region 06/17/2021   Mass of lower outer quadrant of left breast 05/09/2021   Arthritis of left knee 12/02/2020   Chronic left shoulder  pain 12/02/2020   Allergic sinusitis 12/02/2020   Mass of left finger    Anxiety 06/12/2019   Seizure disorder ([place]) 06/12/2019   Environmental and seasonal allergies 06/12/2019   Abnormal Pap smear of cervix 05/27/2019   Chronic  midline low back pain with left-sided sciatica 01/12/2017   Essential hypertension 12/07/2016   Mild intermittent asthma without complication 29/93/7169    Conditions to be addressed/monitored per PCP order:  Depression  Care Plan : LCSW Plan of Care  Updates made by Greg Cutter, LCSW since 06/26/2022 12:00 AM     Problem: Coping Skills for symptoms of Anxiety      Goal: Coping Skills Enhanced by connecting for ongoing therapy   Start Date: 07/20/2021  Recent Progress: On track  Priority: High  Note:   Current Barriers:  Disease Management support and education needs related to Anxiety with Panic Symptoms, and Stress at home  CSW Clinical Goal(s):  Patient  will work with [facility] LCSW to address needs related to managing symptoms of anxiety  through collaboration with Clinical Social Worker, provider, and care team Patient will gain counseling and psychiatry services to help increase mental health support  Interventions: Inter-disciplinary care team collaboration (see longitudinal plan of care) Evaluation of current treatment plan related to self management and patient's adherence to plan as established by provider Review resources, discussed options and provided patient information about  Department of Social Services ( currently receiving services ) Transportation provided by insurance provider (currently using) Discussed therapy with options based on need and insurance  BSW completed a telephone outreach with patient, she states she is not working and does not have any income. Patient states she is in need of resources for everything. BSW will mail patient a list of resources.  Mental Health:  (Status: New goal.) Evaluation of current treatment plan related to Anxiety with Panic Symptoms, Solution-Focused Strategies employed:  Mindfulness or Relaxation training provided Active listening / Reflection utilized  Emotional Support Provided Quality of sleep assessed & Sleep  Hygiene techniques promoted  Provided EMMI education information on relieving stress, and insomnia getting a good night's rest Suicidal Ideation/Homicidal Ideation assessed: denied Collaborated with [facility] in [place], [place] [phone]. Made referral to [facility]( patient has appointment 07/22/2021. Patient was unable to attend this appointment due to a family emergency. She has not rescheduled this appointment yet but will do so today. 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 Patient reports that she has already completed her UDS, TB test and Criminal Background. Update New mental health referrals placed for psychiatry and counseling to [facility] [place] [facility]. 06/16/22 Update- Patient reports that she has not received a call from [facility] [place]. One of the two referrals that [facility] LCSW made is denied. Patient is agreeable to contact agency next week (they close early on Fridays) to make sure she gets scheduled for her initial sessions. Emotional support provided. Mental Health resource contact information provided as well. 06/26/22 update- Patient reports that she has not heard from [facility] yet but will contact them today as they are closed today for the holiday. [facility] provided brief coping skill education for stress management. [facility] LCSW will follow up within 2 weeks. Patient reports that she successfully retrieved email that [facility] LCSW sent during last session. Patient reports that she is implementing  appropriate boundaries to implement negative people, places and things. Patient works on her breathing exercises daily.  Patient denies any SI/HI. Patient reports that she continues to experience signs of grief but was able to turn it around and even let a balloon go in remembrance of her father. Positive reinforcement  provided.   LCSW provided education on relaxation techniques such as meditation, deep breathing, massage, grounding exercsies or yoga that can activate the body's relaxation response and ease symptoms of stress and anxiety. LCSW ask that when pt is struggling with difficult emotions and racing thoughts that they start this relaxation response process. LCSW provided extensive education on healthy coping skills for anxiety. SW used active and reflective listening, validated patient's feelings/concerns, and provided emotional support.  Patient Self-Care Activities: Practice relaxed breathing 3 times a day     24- Hour Availability:    [facility]  [address] [place], [place] [facility] [phone]   [facility] [employer] [place]  [phone]    [place]  [phone] (after hours)   Therapeutic Alternative/Mobile Crisis   [phone]   [place] National Suicide Hotline  530-709-9586 (TALK) OR 988   Call 911 or go to emergency room   [facility]  [phone]);  [place] and [employer]  [phone]); [place], [place], [place], [place], [place], [place], [place]        10 LITTLE Things To Do When You're Feeling Too Down To Do Anything  Take a shower. Even if you plan to stay in all day long and not see a soul, take a shower. It takes the most effort to hop in to the shower but once you do, you'll feel immediate results. It will wake you up and you'll be feeling much fresher (and cleaner too).  Brush and floss your teeth. Give your teeth a good brushing with a floss finish. It's a small task but it feels so good and you can check 'taking care of your health' off the list of things to do.  Do something small on your list. Most of [place] have some small thing we would like to get done (load of laundry, sew a button, email a  friend). Doing one of these things will make you feel like you've accomplished something.  Drink water. Drinking water is easy right? It's also really beneficial for your health so keep a glass beside you all day and take sips often. It gives you energy and prevents you from boredom eating.  Do some floor exercises. The last thing you want to do is exercise but it might be just the thing you need the most. Keep it simple and do exercises that involve sitting or laying on the floor. Even the smallest of exercises release chemicals in the brain that make you feel good. Yoga stretches or core exercises are going to make you feel good with minimal effort.  Make your bed. Making your bed takes a few minutes but it's productive and you'll feel relieved when it's done. An unmade bed is a huge visual reminder that you're having an unproductive day. Do it and consider it your housework for the day.  Put on some nice clothes. Take the sweatpants off even if you don't plan to go anywhere. Put on clothes that make you feel good. Take a look in the mirror so your brain recognizes the sweatpants have been replaced with clothes  that make you look great. It's an instant confidence booster.  Wash the dishes. A pile of dirty dishes in the sink is a reflection of your mood. It's possible that if you wash up the dishes, your mood will follow suit. It's worth a try.  Cook a real meal. If you have the luxury to have a "do nothing" day, you have time to make a real meal for yourself. Make a meal that you love to eat. The process is good to get you out of the funk and the food will ensure you have more energy for tomorrow.  Write out your thoughts by hand. When you hand write, you stimulate your brain to focus on the moment that you're in so make yourself comfortable and write whatever comes into your mind. Put those thoughts out on paper so they stop spinning around in your head. Those thoughts might be the very thing  holding you down.      Follow up:  Patient agrees to Care Plan and Follow-up.  Plan: The Managed Medicaid care management team will reach out to the patient again over the next 30 days.  Date/time of next scheduled Social Work care management/care coordination outreach:  07/10/22 at [place], [place], MSW, [email or web address] Phone: [phone]

## 2022-06-26 NOTE — Telephone Encounter (Signed)
I called pt. No answer, left a message asking pt to call me back.  Telephone room can relay normal results if pt calls back.

## 2022-06-27 ENCOUNTER — Telehealth: Payer: Self-pay | Admitting: Orthopaedic Surgery

## 2022-06-27 ENCOUNTER — Telehealth: Payer: Self-pay

## 2022-06-27 MED ORDER — HYDROCODONE-ACETAMINOPHEN 5-325 MG PO TABS: ORAL_TABLET | ORAL | 0 refills | Status: DC

## 2022-06-27 NOTE — Telephone Encounter (Signed)
Called and spoke to patient, Pt verbalized understanding. Pt had no questions at this time but was encouraged to call back if questions arise.

## 2022-06-27 NOTE — Telephone Encounter (Signed)
SENT TO PROVIDER

## 2022-06-27 NOTE — Telephone Encounter (Signed)
-----  Message from Alric Ran, MD sent at 06/23/2022 10:13 AM EST ----- Please call and inform patient that her recent EEG (Brain wave test) was normal. In particular, there were no epileptiform discharges and no seizures. No further action is required on this test at this time. Please keep any upcoming appointments or tests and  call us with any interim questions, concerns, problems or updates. Thanks,   Alric Ran, MD

## 2022-06-27 NOTE — Telephone Encounter (Signed)
Patient lvm stating her pain medication is due today.  She would like a refill on Hydrocodone 5-325 to be sent [facility] on [address].  Pt's # [phone]

## 2022-06-28 ENCOUNTER — Ambulatory Visit: Payer: Medicaid Other | Admitting: Orthopaedic Surgery

## 2022-06-29 ENCOUNTER — Encounter (HOSPITAL_COMMUNITY): Payer: Self-pay

## 2022-06-29 ENCOUNTER — Telehealth (HOSPITAL_COMMUNITY): Payer: Self-pay | Admitting: Physical Therapy

## 2022-06-29 ENCOUNTER — Ambulatory Visit (HOSPITAL_COMMUNITY): Payer: Medicaid Other | Admitting: Physical Therapy

## 2022-06-29 NOTE — Telephone Encounter (Signed)
Patient no show, left message for patient to make her aware of missed appointment and reminded her of next appointment.   8:57 AM, 06/29/22 Mearl Latin PT, DPT Physical Therapist at [facility]

## 2022-06-30 ENCOUNTER — Other Ambulatory Visit: Payer: Medicaid Other | Admitting: *Deleted

## 2022-06-30 NOTE — Patient Outreach (Signed)
  Medicaid Managed Care   Unsuccessful Attempt Note   06/30/2022 Name: Judy Cross MRN: [id number] DOB: Jul 20, 1965  Referred by: Lindell Spar, MD Reason for referral : High Risk Managed Medicaid (Unsuccessful RNCM follow up telephone outreach)   An unsuccessful telephone outreach was attempted today. The patient was referred to the case management team for assistance with care management and care coordination.    Follow Up Plan: A HIPAA compliant phone message was left for the patient providing contact information and requesting a return call. and The Managed Medicaid care management team will reach out to the patient again over the next 14 days.    Lurena Joiner RN, BSN   [employer] manager

## 2022-06-30 NOTE — Patient Instructions (Signed)
Visit Information  Judy Cross  - as a part of your Medicaid benefit, you are eligible for care management and care coordination services at no cost or copay. I was unable to reach you by phone today but would be happy to help you with your health related needs. Please feel free to call me @ [phone].   A member of the Managed Medicaid care management team will reach out to you again over the next 14 days.   Lurena Joiner RN, BSN [name]  [employer] manager

## 2022-07-04 ENCOUNTER — Encounter: Payer: Self-pay | Admitting: Orthopaedic Surgery

## 2022-07-04 ENCOUNTER — Ambulatory Visit (INDEPENDENT_AMBULATORY_CARE_PROVIDER_SITE_OTHER): Payer: Medicaid Other | Admitting: Orthopaedic Surgery

## 2022-07-04 ENCOUNTER — Encounter (HOSPITAL_COMMUNITY): Payer: Medicaid Other | Admitting: Physical Therapy

## 2022-07-04 VITALS — BP 159/102 | HR 68

## 2022-07-04 DIAGNOSIS — F17210 Nicotine dependence, cigarettes, uncomplicated: Secondary | ICD-10-CM

## 2022-07-04 DIAGNOSIS — G8929 Other chronic pain: Secondary | ICD-10-CM | POA: Diagnosis not present

## 2022-07-04 DIAGNOSIS — M25562 Pain in left knee: Secondary | ICD-10-CM | POA: Diagnosis not present

## 2022-07-04 MED ORDER — HYDROCODONE-ACETAMINOPHEN 5-325 MG PO TABS: ORAL_TABLET | ORAL | 0 refills | Status: DC

## 2022-07-04 NOTE — Progress Notes (Signed)
My knee is no better.  She complains of pain in the left knee all the time.  She is using a knee brace.  She has swelling.  She has been going to PT with no help.  She has giving way.  I will have Dr. Aline Brochure see her again.  I have reviewed the PT notes.  Left knee is painful, ROM 0 to 110, slight effusion, crepitus, stable, NV intact.  No distal edema.  Encounter Diagnoses  Name Primary?   Chronic pain of left knee Yes   Cigarette nicotine dependence without complication    I have reviewed the [place] web site prior to prescribing narcotic medicine for this patient.  To see Dr. Lemmie Evens.  Return here in six weeks.  Call if any problem.  Precautions discussed.  Electronically Signed [name], MD 1/23/20248:23 AM

## 2022-07-04 NOTE — Patient Instructions (Signed)
SCHEDULE W/ DR.HARRISON ROV LT KNEE NOT IMPROVING

## 2022-07-06 ENCOUNTER — Ambulatory Visit (HOSPITAL_COMMUNITY): Payer: Medicaid Other | Admitting: Physical Therapy

## 2022-07-06 ENCOUNTER — Encounter (HOSPITAL_COMMUNITY): Payer: Self-pay | Admitting: Physical Therapy

## 2022-07-06 DIAGNOSIS — M25562 Pain in left knee: Secondary | ICD-10-CM | POA: Diagnosis not present

## 2022-07-06 DIAGNOSIS — M6281 Muscle weakness (generalized): Secondary | ICD-10-CM

## 2022-07-06 DIAGNOSIS — M5459 Other low back pain: Secondary | ICD-10-CM

## 2022-07-06 DIAGNOSIS — R2689 Other abnormalities of gait and mobility: Secondary | ICD-10-CM

## 2022-07-06 DIAGNOSIS — R29898 Other symptoms and signs involving the musculoskeletal system: Secondary | ICD-10-CM

## 2022-07-06 NOTE — Therapy (Signed)
OUTPATIENT PHYSICAL THERAPY TREATMENT   Patient Name: Judy Cross MRN: [id number] DOB:07-11-1965, 57 y.o., female Today's Date: 07/06/2022  END OF SESSION:  PT End of Session - 07/06/22 0901     Visit Number 2    Number of Visits 12    Date for PT Re-Evaluation 08/02/22    Authorization Type [place] Medicaid UHC    Authorization Time Period Per UHC no auth, required for pts over the age of 72, visit limit 30 PT/OT/SLP    Authorization - Visit Number 2    Authorization - Number of Visits 30    Progress Note Due on Visit 10    PT Start Time 0902    PT Stop Time 0941    PT Time Calculation (min) 39 min    Activity Tolerance Patient tolerated treatment well    Behavior During Therapy [facility] for tasks assessed/performed             Past Medical History:  Diagnosis Date   Allergy    Anxiety 06/12/2019   Arthritis    Asthma in adult, mild intermittent, uncomplicated 6/59/9357   Breast nodule 10/12/2015   Breast nodule 10/12/2015   Bulge of lumbar disc without myelopathy    Endometrial polyp 04/28/2013   Enlarged uterus 03/26/2013   Fibroids 04/28/2013   Hot flashes 03/26/2013   Hot flashes 03/26/2013   Hypertension    Irregular bleeding 03/26/2013   Neuromuscular disorder ([place])    sciatica   Seasonal allergies    Seizures ([place])    last seizure was 2 years ago; unknown etiology. On Keppra.   Trichomoniasis 10/20/2020   tx 10/20/20  POC___   Past Surgical History:  Procedure Laterality Date   ABLATION     with polyp removal.   BREAST BIOPSY Left    neg   CESAREAN SECTION     x 3   COLONOSCOPY WITH PROPOFOL N/A 01/11/2016   Surgeon: Danie Binder, MD; mild diverticulosis in proximal ascending colon, nonbleeding internal hemorrhoids.  Repeat in 10 years.   EXAMINATION UNDER ANESTHESIA  02/28/2012   Procedure: EXAM UNDER ANESTHESIA;  Surgeon: Donato Heinz, MD;  Location: [facility];  Service: General;  Laterality: N/A;   EXCISION MASS UPPER EXTREMETIES Left 11/13/2019    Procedure: EXCISION MASS UPPER EXTREMETIES ring finger left;  Surgeon: Carole Civil, MD;  Location: [facility];  Service: Orthopedics;  Laterality: Left;   GANGLION CYST EXCISION Left    HYSTEROSCOPY WITH D & C N/A 05/23/2013   Procedure: DILATATION AND CURETTAGE /HYSTEROSCOPY;  Surgeon: Jonnie Kind, MD;  Location: [facility];  Service: Gynecology;  Laterality: N/A;   POLYPECTOMY N/A 05/23/2013   Procedure: ENDOMETRIAL POLYP REMOVAL;  Surgeon: Jonnie Kind, MD;  Location: [facility];  Service: Gynecology;  Laterality: N/A;   SPHINCTEROTOMY  02/28/2012   Procedure: SPHINCTEROTOMY;  Surgeon: Donato Heinz, MD;  Location: [facility];  Service: General;  Laterality: N/A;  Lateral Internal Sphincterotomy   TRIGGER FINGER RELEASE Left    ring finger   TUBAL LIGATION     Patient Active Problem List   Diagnosis Date Noted   Hypokalemia 03/22/2022   Vitamin D deficiency 03/22/2022   Fall 03/22/2022   Current moderate episode of major depressive disorder without prior episode ([place]) 03/21/2022   Tobacco abuse 01/06/2022   Spinal stenosis of lumbar region 06/17/2021   Mass of lower outer quadrant of left breast 05/09/2021   Arthritis of left knee 12/02/2020   Chronic left  shoulder pain 12/02/2020   Allergic sinusitis 12/02/2020   Mass of left finger    Anxiety 06/12/2019   Seizure disorder ([place]) 06/12/2019   Environmental and seasonal allergies 06/12/2019   Abnormal Pap smear of cervix 05/27/2019   Chronic midline low back pain with left-sided sciatica 01/12/2017   Essential hypertension 12/07/2016   Mild intermittent asthma without complication 35/45/6256    PCP: Ihor Dow MD  REFERRING PROVIDER: [name], MD  REFERRING DIAG: [phone] (ICD-10-CM) - Chronic pain of left knee  THERAPY DIAG:  Left knee pain, unspecified chronicity  Other low back pain  Muscle weakness (generalized)  Other abnormalities of gait and mobility  Other symptoms and signs involving the  musculoskeletal system  Rationale for Evaluation and Treatment: Rehabilitation  ONSET DATE: 5-6 years +  SUBJECTIVE:   SUBJECTIVE STATEMENT: Patient states leg has been hurting still. Leg hasn't been feeling better. Continues to have swelling. Pain worse with raining. Has OA and a cyst and feels like something is not right.   PERTINENT HISTORY: Cyst in left knee; seizures, anxiety and depression, LBP, herniated disc in back PAIN:  Are you having pain? Yes: NPRS scale: 7/10 Pain location: L knee Pain description: numb Aggravating factors: constant Relieving factors: none  PRECAUTIONS: None  WEIGHT BEARING RESTRICTIONS: No  FALLS:  Has patient fallen in last 6 months? Yes. Number of falls 7  LIVING ENVIRONMENT: Lives with: lives with their family Lives in: House/apartment Stairs: Yes: External: 1 steps; none Has following equipment at home: None  OCCUPATION: unemployed  PLOF: Independent  PATIENT GOALS: get back and leg feeling better  NEXT MD VISIT: Tomorrow  OBJECTIVE:   DIAGNOSTIC FINDINGS: XR 03/10/21 Impression: effusion left knee, mild degenerative joint disease, no fracture noted   PATIENT SURVEYS: LEFS 21/80  COGNITION: Overall cognitive status: Within functional limits for tasks assessed     SENSATION: WFL   POSTURE: rounded shoulders and forward head  PALPATION: TTP R quad, grossly throughout knee  LOWER EXTREMITY ROM:  Active ROM Right eval Left eval  Hip flexion    Hip extension    Hip abduction    Hip adduction    Hip internal rotation    Hip external rotation    Knee flexion 133 119  Knee extension 0 0  Ankle dorsiflexion    Ankle plantarflexion    Ankle inversion    Ankle eversion     (Blank rows = not tested)  LOWER EXTREMITY MMT:  MMT Right eval Left eval  Hip flexion 4+ 4  Hip extension 4+ 4  Hip abduction 4+ 4  Hip adduction    Hip internal rotation    Hip external rotation    Knee flexion 5 4*  Knee extension 5  4*  Ankle dorsiflexion    Ankle plantarflexion    Ankle inversion    Ankle eversion     (Blank rows = not tested) LUMBAR ROM:    Active  AROM  eval   Flexion 0% limited   Extension 25% limited   Right lateral flexion 0% limited    Left lateral flexion  0% limited   Right rotation     Left rotation     (Blank rows = not tested)    FUNCTIONAL TESTS:  5 times sit to stand: 10.3 seconds without UE use, equal bilateral LE use, dynamic knee valgus bilaterally 2 minute walk test: 185 feet  GAIT: Distance walked: 185 feet Assistive device utilized: None Level of assistance: Modified independence Comments:  2MWT, antalgic on L with L knee flexed in stance, limited L knee flexion extension, intermittent unsteadiness without loss of balance   TODAY'S TREATMENT:                                                                                                                              DATE:  07/06/22 Nustep - seat 7, level 2, 5 minutes for dynamic warm up and conditioning SLR 2 x 10 bilateral  Sidelying hip abduction 2 x 10 bilateral  LAQ x 10 with 5 second holds bilateral  Calf stretch on slant board 3 x 20-30 second holds Standing HR 1 x 15 Standing TR 1 x 15 Lateral stepping 6 x 10 feet bilateral Step up 4 inch 3 x 10 bilateral  Standing hip abduction RTB at knees 2x 10 bilateral   06/21/22 SLR x 10 LAQ x 10 with 5 second holds    PATIENT EDUCATION:  Education details: 07/06/22: HEP; EVAL: Patient educated on exam findings, POC, scope of PT, HEP Person educated: Patient Education method: Explanation, Demonstration, and Handouts Education comprehension: verbalized understanding, returned demonstration, verbal cues required, and tactile cues required  HOME EXERCISE PROGRAM: Access Code: [email or web address] URL: [email or web address] 07/06/22 - Sidelying Hip Abduction  - 1 x daily - 7 x weekly - 2 sets - 10 reps - Standing Hip Abduction with Resistance at Ankles and  Counter Support  - 1 x daily - 7 x weekly - 2 sets - 10 reps  Date: 06/21/2022 - Supine Active Straight Leg Raise  - 2-3 x daily - 7 x weekly - 2 sets - 10 reps - Seated Long Arc Quad  - 2-3 x daily - 7 x weekly - 2 sets - 10 reps - 5 second hold  ASSESSMENT:  CLINICAL IMPRESSION: Began session with nustep for dynamic warm up and conditioning. Continued with LE and glute strengthening which is performed with good mechanics after initial cueing and demonstration. L dynamic knee valgus with step exercises. Cueing for LE alignment and avoiding L knee hyperextension with fair carry over. Patient will continue to benefit from physical therapy in order to improve function and reduce impairment.   OBJECTIVE IMPAIRMENTS: Abnormal gait, decreased activity tolerance, decreased balance, decreased endurance, decreased mobility, difficulty walking, decreased ROM, decreased strength, increased muscle spasms, impaired flexibility, improper body mechanics, and pain.   ACTIVITY LIMITATIONS: carrying, lifting, bending, standing, squatting, stairs, transfers, locomotion level, and caring for others  PARTICIPATION LIMITATIONS: meal prep, cleaning, laundry, shopping, community activity, occupation, and yard work  PERSONAL FACTORS: Fitness, Time since onset of injury/illness/exacerbation, and 3+ comorbidities: Cyst in left knee; seizures, anxiety and depression, LBP, herniated disc in back  are also affecting patient's functional outcome.   REHAB POTENTIAL: Good  CLINICAL DECISION MAKING: Stable/uncomplicated  EVALUATION COMPLEXITY: Low   GOALS: Goals reviewed with patient? Yes  SHORT TERM GOALS: Target date: 07/12/2022    Patient will be independent with HEP in order to improve functional  outcomes. Baseline: Goal status: INITIAL  2.  Patient will report at least 25% improvement in symptoms for improved quality of life. Baseline: Goal status: INITIAL    LONG TERM GOALS: Target date:  08/02/22  Patient will report at least 75% improvement in symptoms for improved quality of life. Baseline:  Goal status: INITIAL  2.  Patient will improve FOTO score by at least 9 points in order to indicate improved tolerance to activity. Baseline: 21/80 Goal status: INITIAL  3.  Patient will be able to navigate stairs with reciprocal pattern without compensation in order to demonstrate improved LE strength. Baseline: unable Goal status: INITIAL  4.  Patient will be able to ambulate at least 226 feet in 2MWT in order to demonstrate improved tolerance to activity. Baseline: 135 feet Goal status: INITIAL  5.  Patient will demonstrate grade of 5/5 MMT grade in all tested musculature as evidence of improved strength to assist with stair ambulation and gait.   Baseline: see above Goal status: INITIAL   PLAN:  PT FREQUENCY: 2x/week  PT DURATION: 6 weeks  PLANNED INTERVENTIONS: Therapeutic exercises, Therapeutic activity, Neuromuscular re-education, Balance training, Gait training, Patient/Family education, Joint manipulation, Joint mobilization, Stair training, Orthotic/Fit training, DME instructions, Aquatic Therapy, Dry Needling, Electrical stimulation, Spinal manipulation, Spinal mobilization, Cryotherapy, Moist heat, Compression bandaging, scar mobilization, Splintting, Taping, Traction, Ultrasound, Ionotophoresis '4mg'$ /ml Dexamethasone, and Manual therapy  PLAN FOR NEXT SESSION: quad strength, glute strength, core strength; f/u with HEP, progress as able, gait/balance training   Mearl Latin, PT 07/06/2022, 9:42 AM

## 2022-07-10 ENCOUNTER — Encounter (HOSPITAL_COMMUNITY): Payer: Medicaid Other | Admitting: Physical Therapy

## 2022-07-10 ENCOUNTER — Other Ambulatory Visit: Payer: Medicaid Other | Admitting: Licensed Clinical Social Worker

## 2022-07-10 NOTE — Patient Instructions (Signed)
Visit Information  Ms. Cropley was given information about Medicaid Managed Care team care coordination services as a part of their [place] Medicaid benefit. Kyung ORI KREITER verbally consented to engagement with the [facility] Managed Care team.   If you are experiencing a medical emergency, please call 911 or report to your local emergency department or urgent care.   If you have a non-emergency medical problem during routine business hours, please contact your provider's office and ask to speak with a nurse.   For questions related to your [facility], please call: [phone] or visit the homepage here: [email or web address]  If you would like to schedule transportation through your [facility], please call the following number at least 2 days in advance of your appointment: [phone]   Rides for urgent appointments can also be made after hours by calling Member Services.  Call the [place] at [phone], at any time, 24 hours a day, 7 days a week. If you are in danger or need immediate medical attention call 911.  If you would like help to quit smoking, call 1-800-QUIT-NOW (209) 129-0013) OR Espaol: 1-855-Djelo-Ya (0-109-323-5573) o para ms informacin haga clic aqu or Text READY to 200-400 to register via text   Following is a copy of your plan of care:  Care Plan : LCSW Plan of Care  Updates made by Greg Cutter, LCSW since 07/10/2022 12:00 AM     Problem: Coping Skills for symptoms of Anxiety      Goal: Coping Skills Enhanced by connecting for ongoing therapy   Start Date: 07/20/2021  Recent Progress: On track  Priority: High  Note:   Current Barriers:  Disease Management support and education needs related to Anxiety with Panic Symptoms, and Stress at home  CSW Clinical Goal(s):  Patient  will work with [facility] LCSW to  address needs related to managing symptoms of anxiety  through collaboration with Clinical Social Worker, provider, and care team Patient will gain counseling and psychiatry services to help increase mental health support  Interventions: Inter-disciplinary care team collaboration (see longitudinal plan of care) Evaluation of current treatment plan related to self management and patient's adherence to plan as established by provider Review resources, discussed options and provided patient information about  Department of Social Services ( currently receiving services ) Transportation provided by insurance provider (currently using) Discussed therapy with options based on need and insurance  BSW completed a telephone outreach with patient, she states she is not working and does not have any income. Patient states she is in need of resources for everything. BSW will mail patient a list of resources.  10 LITTLE Things To Do When You're Feeling Too Down To Do Anything  Take a shower. Even if you plan to stay in all day long and not see a soul, take a shower. It takes the most effort to hop in to the shower but once you do, you'll feel immediate results. It will wake you up and you'll be feeling much fresher (and cleaner too).  Brush and floss your teeth. Give your teeth a good brushing with a floss finish. It's a small task but it feels so good and you can check 'taking care of your health' off the list of things to do.  Do something small on your list. Most of [place] have some small thing we would like to get done (load of laundry, sew a button, email a friend). Doing one  of these things will make you feel like you've accomplished something.  Drink water. Drinking water is easy right? It's also really beneficial for your health so keep a glass beside you all day and take sips often. It gives you energy and prevents you from boredom eating.  Do some floor exercises. The last thing you want to do is  exercise but it might be just the thing you need the most. Keep it simple and do exercises that involve sitting or laying on the floor. Even the smallest of exercises release chemicals in the brain that make you feel good. Yoga stretches or core exercises are going to make you feel good with minimal effort.  Make your bed. Making your bed takes a few minutes but it's productive and you'll feel relieved when it's done. An unmade bed is a huge visual reminder that you're having an unproductive day. Do it and consider it your housework for the day.  Put on some nice clothes. Take the sweatpants off even if you don't plan to go anywhere. Put on clothes that make you feel good. Take a look in the mirror so your brain recognizes the sweatpants have been replaced with clothes that make you look great. It's an instant confidence booster.  Wash the dishes. A pile of dirty dishes in the sink is a reflection of your mood. It's possible that if you wash up the dishes, your mood will follow suit. It's worth a try.  Cook a real meal. If you have the luxury to have a "do nothing" day, you have time to make a real meal for yourself. Make a meal that you love to eat. The process is good to get you out of the funk and the food will ensure you have more energy for tomorrow.  Write out your thoughts by hand. When you hand write, you stimulate your brain to focus on the moment that you're in so make yourself comfortable and write whatever comes into your mind. Put those thoughts out on paper so they stop spinning around in your head. Those thoughts might be the very thing holding you down.

## 2022-07-10 NOTE — Patient Outreach (Signed)
Medicaid Managed Care Social Work Note  07/10/2022 Name:  Judy Cross MRN:  [id number] DOB:  [birth year]  Judy Cross is an 57 y.o. year old female who is a primary patient of Lindell Spar, MD.  The Medicaid Managed Care Coordination team was consulted for assistance with:  [place] and Resources  Ms. Roig was given information about Medicaid Managed Care Coordination team services today. Judy Cross Patient agreed to services and verbal consent obtained.  Engaged with patient  for by telephone forfollow up visit in response to referral for case management and/or care coordination services.   Assessments/Interventions:  Review of past medical history, allergies, medications, health status, including review of consultants reports, laboratory and other test data, was performed as part of comprehensive evaluation and provision of chronic care management services.  SDOH: (Social Determinant of Health) assessments and interventions performed: SDOH Interventions    Flowsheet Row Patient Outreach Telephone from 07/10/2022 in [place] Patient Outreach Telephone from 06/26/2022 in [place] Patient Outreach Telephone from 06/16/2022 in [place] Patient Outreach Telephone from 06/07/2022 in [place] Telephone from 05/30/2022 in [place] Patient Outreach Telephone from 08/26/2021 in [place] Coordination  SDOH Interventions        Transportation Interventions -- -- Intervention Not Indicated -- Intervention Not Indicated --  Depression Interventions/Treatment  -- -- -- -- -- Firefighter Strain Interventions -- -- -- -- --  [Referred to [employer] [facility]] --  Stress Interventions Offered [employer], Provide Counseling Offered [employer], Provide  Counseling -- [employer], Provide Counseling -- Provide Counseling       Advanced Directives Status:  See Care Plan for related entries.  Care Plan                 Allergies  Allergen Reactions   Lisinopril Swelling    swelling to entire mouth.   Penicillins Rash    Has patient had a PCN reaction causing immediate rash, facial/tongue/throat swelling, SOB or lightheadedness with hypotension: no - next day Has patient had a PCN reaction causing severe rash involving mucus membranes or skin necrosis: No Has patient had a PCN reaction that required hospitalization No Has patient had a PCN reaction occurring within the last 10 years: Yes If all of the above answers are "NO", then may proceed with Cephalosporin use.    Tramadol Other (See Comments)    dizziness    Medications Reviewed Today     Reviewed by Mearl Latin, PT (Physical Therapist) on 07/06/22 at [place] List Status: <None>   Medication Order Taking? Sig Documenting Provider Last Dose Status Informant  HYDROcodone-acetaminophen (NORCO/VICODIN) 5-325 MG tablet [id number]  One tablet every six hours for pain.  Limit 5 days. Judy Kava, MD  Active             Patient Active Problem List   Diagnosis Date Noted   Hypokalemia 03/22/2022   Vitamin D deficiency 03/22/2022   Fall 03/22/2022   Current moderate episode of major depressive disorder without prior episode ([place]) 03/21/2022   Tobacco abuse 01/06/2022   Spinal stenosis of lumbar region 06/17/2021   Mass of lower outer quadrant of left breast 05/09/2021   Arthritis of left knee 12/02/2020   Chronic left shoulder pain 12/02/2020   Allergic sinusitis 12/02/2020   Mass of left finger  Anxiety 06/12/2019   Seizure disorder ([place]) 06/12/2019   Environmental and seasonal allergies 06/12/2019   Abnormal Pap smear of cervix 05/27/2019   Chronic midline low back pain with left-sided sciatica 01/12/2017   Essential hypertension  12/07/2016   Mild intermittent asthma without complication 82/50/5397    Conditions to be addressed/monitored per PCP order:  Depression  Care Plan : LCSW Plan of Care  Updates made by Greg Cutter, LCSW since 07/10/2022 12:00 AM     Problem: Coping Skills for symptoms of Anxiety      Goal: Coping Skills Enhanced by connecting for ongoing therapy   Start Date: 07/20/2021  Recent Progress: On track  Priority: High  Note:   Current Barriers:  Disease Management support and education needs related to Anxiety with Panic Symptoms, and Stress at home  CSW Clinical Goal(s):  Patient  will work with [facility] LCSW to address needs related to managing symptoms of anxiety  through collaboration with Clinical Social Worker, provider, and care team Patient will gain counseling and psychiatry services to help increase mental health support  Interventions: Inter-disciplinary care team collaboration (see longitudinal plan of care) Evaluation of current treatment plan related to self management and patient's adherence to plan as established by provider Review resources, discussed options and provided patient information about  Department of Social Services ( currently receiving services ) Transportation provided by insurance provider (currently using) Discussed therapy with options based on need and insurance  BSW completed a telephone outreach with patient, she states she is not working and does not have any income. Patient states she is in need of resources for everything. BSW will mail patient a list of resources.  Mental Health:  (Status: New goal.) Evaluation of current treatment plan related to Anxiety with Panic Symptoms, Solution-Focused Strategies employed:  Mindfulness or Relaxation training provided Active listening / Reflection utilized  Emotional Support Provided Quality of sleep assessed & Sleep Hygiene techniques promoted  Provided EMMI education information on relieving stress, and  insomnia getting a good night's rest Suicidal Ideation/Homicidal Ideation assessed: denied Collaborated with [facility] in [place], [place] [phone]. Made referral to [facility]( patient has appointment 07/22/2021. Patient was unable to attend this appointment due to a family emergency. She has not rescheduled this appointment yet but will do so today. 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 Patient reports that she has already completed her UDS, TB test and Criminal Background. Update New mental health referrals placed for psychiatry and counseling to [facility] [place]. 06/16/22 Update- Patient reports that she has not received a call from [facility] [place]. One of the two referrals that [facility] LCSW made is denied. Patient is agreeable to contact agency next week (they close early on Fridays) to make sure she gets scheduled for her initial sessions. Emotional support provided. Mental Health resource contact information provided as well. 06/26/22 update- Patient reports that she has not heard from [facility] yet but will contact them today as they are closed today for the holiday. [facility] provided brief coping skill education for stress management. [facility] LCSW will follow up within 2 weeks. Patient reports that she successfully retrieved email that [facility] LCSW sent during last session. 07/10/22 update- [place] LCSW was informed by patient that her boyfriend of 2 months passed away over the weekend which has increased  her symptoms of grief and depression. Grief support provided to patient and she was receptive to this. [facility] LCSW made call to [facility] [place] and spoke to staff. They will contact patient directly today to schedule her counseling appointment but her psychiatry referral will have to be reviewed first before  scheduling even though that referral was made over a month ago. [facility] LCSW will follow up next month.  Patient reports that she is implementing appropriate boundaries to implement negative people, places and things. Patient works on her breathing exercises daily.  Patient denies any SI/HI. Patient reports that she continues to experience signs of grief but was able to turn it around and even let a balloon go in remembrance of her father. Positive reinforcement provided.   LCSW provided education on relaxation techniques such as meditation, deep breathing, massage, grounding exercsies or yoga that can activate the body's relaxation response and ease symptoms of stress and anxiety. LCSW ask that when pt is struggling with difficult emotions and racing thoughts that they start this relaxation response process. LCSW provided extensive education on healthy coping skills for anxiety. SW used active and reflective listening, validated patient's feelings/concerns, and provided emotional support.  Patient Self-Care Activities: Practice relaxed breathing 3 times a day     24- Hour Availability:    [facility]  [address] [place], [place] [facility] [phone]   [facility] [employer] [place]  [phone]    [place]  [phone] (after hours)   [facility]   [phone]   [place] National Suicide Hotline  858-078-3559 (TALK) OR 988   Call 911 or go to emergency room   [facility]  [phone]);  [place] and [employer]  [phone]); [place], [place], [place], [place], [place], [place], [place]        10 LITTLE Things To Do When You're Feeling Too Down To Do Anything  Take a shower. Even if you plan to stay in all day long and not see a soul, take a shower. It takes the most effort to hop in to the shower  but once you do, you'll feel immediate results. It will wake you up and you'll be feeling much fresher (and cleaner too).  Brush and floss your teeth. Give your teeth a good brushing with a floss finish. It's a small task but it feels so good and you can check 'taking care of your health' off the list of things to do.  Do something small on your list. Most of [place] have some small thing we would like to get done (load of laundry, sew a button, email a friend). Doing one of these things will make you feel like you've accomplished something.  Drink water. Drinking water is easy right? It's also really beneficial for your health so keep a glass beside you all day and take sips often. It gives you energy and prevents you from boredom eating.  Do some floor exercises. The last thing you want to do is exercise but it might be just the thing you need the most. Keep it simple and do exercises that involve sitting or laying on the floor. Even the smallest of exercises release chemicals in the brain that make you feel good. Yoga stretches or core exercises are going to make you feel good with minimal effort.  Make your bed. Making your bed takes a few  minutes but it's productive and you'll feel relieved when it's done. An unmade bed is a huge visual reminder that you're having an unproductive day. Do it and consider it your housework for the day.  Put on some nice clothes. Take the sweatpants off even if you don't plan to go anywhere. Put on clothes that make you feel good. Take a look in the mirror so your brain recognizes the sweatpants have been replaced with clothes that make you look great. It's an instant confidence booster.  Wash the dishes. A pile of dirty dishes in the sink is a reflection of your mood. It's possible that if you wash up the dishes, your mood will follow suit. It's worth a try.  Cook a real meal. If you have the luxury to have a "do nothing" day, you have time to make a real meal  for yourself. Make a meal that you love to eat. The process is good to get you out of the funk and the food will ensure you have more energy for tomorrow.  Write out your thoughts by hand. When you hand write, you stimulate your brain to focus on the moment that you're in so make yourself comfortable and write whatever comes into your mind. Put those thoughts out on paper so they stop spinning around in your head. Those thoughts might be the very thing holding you down.      Follow up:  Patient agrees to Care Plan and Follow-up.  Plan: The Managed Medicaid care management team will reach out to the patient again over the next 30 days.  Date/time of next scheduled Social Work care management/care coordination outreach:  07/19/22 at [place], [place], MSW, [place] Medicaid LCSW [email or web address] Phone: [phone]

## 2022-07-11 ENCOUNTER — Other Ambulatory Visit: Payer: Self-pay | Admitting: Neurology

## 2022-07-11 ENCOUNTER — Telehealth: Payer: Self-pay | Admitting: Orthopaedic Surgery

## 2022-07-11 ENCOUNTER — Other Ambulatory Visit (HOSPITAL_COMMUNITY): Payer: Self-pay

## 2022-07-11 ENCOUNTER — Other Ambulatory Visit: Payer: Medicaid Other | Admitting: Pharmacist

## 2022-07-11 MED ORDER — HYDROCHLOROTHIAZIDE 25 MG PO TABS
25.0000 mg | ORAL_TABLET | Freq: Every day | ORAL | 3 refills | Status: DC
  Filled 2022-07-12: qty 30, 30d supply, fill #0

## 2022-07-11 MED ORDER — LEVOCETIRIZINE DIHYDROCHLORIDE 5 MG PO TABS: 5.0000 mg | ORAL_TABLET | Freq: Every evening | ORAL | 1 refills | Status: DC

## 2022-07-11 MED ORDER — VITAMIN D (ERGOCALCIFEROL) 1.25 MG (50000 UNIT) PO CAPS
50000.0000 [IU] | ORAL_CAPSULE | ORAL | 1 refills | Status: DC
  Filled 2022-07-12: qty 4, 28d supply, fill #0

## 2022-07-11 MED ORDER — LEVETIRACETAM 750 MG PO TABS: 750.0000 mg | ORAL_TABLET | Freq: Two times a day (BID) | ORAL | 11 refills | Status: AC

## 2022-07-11 MED ORDER — SERTRALINE HCL 25 MG PO TABS: 25.0000 mg | ORAL_TABLET | Freq: Every day | ORAL | 1 refills | Status: DC

## 2022-07-11 MED ORDER — HYDROCODONE-ACETAMINOPHEN 5-325 MG PO TABS: ORAL_TABLET | ORAL | 0 refills | Status: DC

## 2022-07-11 MED ORDER — ALBUTEROL SULFATE HFA 108 (90 BASE) MCG/ACT IN AERS: 2.0000 | INHALATION_SPRAY | Freq: Four times a day (QID) | RESPIRATORY_TRACT | 11 refills | Status: DC | PRN

## 2022-07-11 MED ORDER — OMEPRAZOLE 20 MG PO CPDR
20.0000 mg | DELAYED_RELEASE_CAPSULE | Freq: Every day | ORAL | 1 refills | Status: DC
  Filled 2022-07-12: qty 30, 30d supply, fill #0

## 2022-07-11 NOTE — Telephone Encounter (Signed)
Patient lvm to let us know that her medication is due today.  Hydrocodone 5-325, [place].

## 2022-07-11 NOTE — Progress Notes (Signed)
Care Coordination Call  Contacted patient. Notes she has been out of all of her medications for several weeks due to not being able to afford the copay. She has Medicaid and a [employer] plan that requires her to use [facility] and she doesn't know how to transfer prescriptions.   Discussed with PCP, placed refills for co-sign to restart sertraline 25 mg daily, HCTZ 25 mg daily, albuterol HFA, levocetirizine, Vitamin D weekly, omeprazole 20 mg daily. Collaborated with neurology to restart keppra.   [facility]; copay of all medications will be $26 which patient cannot afford. Coordinated with pharmacy team at [facility] - they will transfer prescriptions, place copays on A/R account and mail to patient.   Will collaborate with pharmacy team to outreach patient for follow up in ~ 4 weeks.   Catie Hedwig Morton, PharmD, [place], [facility] [phone]

## 2022-07-12 ENCOUNTER — Other Ambulatory Visit (HOSPITAL_COMMUNITY): Payer: Self-pay

## 2022-07-12 ENCOUNTER — Encounter (HOSPITAL_COMMUNITY): Payer: Medicaid Other

## 2022-07-12 MED ORDER — HYDROCHLOROTHIAZIDE 25 MG PO TABS
25.0000 mg | ORAL_TABLET | Freq: Every day | ORAL | 0 refills | Status: DC
  Filled 2022-07-12: qty 90, 90d supply, fill #0

## 2022-07-13 ENCOUNTER — Other Ambulatory Visit (HOSPITAL_COMMUNITY): Payer: Self-pay

## 2022-07-18 ENCOUNTER — Telehealth (HOSPITAL_COMMUNITY): Payer: Self-pay | Admitting: Physical Therapy

## 2022-07-18 ENCOUNTER — Telehealth: Payer: Self-pay | Admitting: Orthopaedic Surgery

## 2022-07-18 ENCOUNTER — Encounter (HOSPITAL_COMMUNITY): Payer: Medicaid Other | Admitting: Physical Therapy

## 2022-07-18 MED ORDER — HYDROCODONE-ACETAMINOPHEN 5-325 MG PO TABS: ORAL_TABLET | ORAL | 0 refills | Status: DC

## 2022-07-18 NOTE — Telephone Encounter (Signed)
Pt did not show for appt today.  Today is second NS.  Called and informed of next scheduled appt.  All other ones removed due to NS policy.  Teena Irani, PTA/CLT [place] Ph: [phone]

## 2022-07-18 NOTE — Telephone Encounter (Signed)
Patient lvm requesting a refill for his Hydrocodone 5-325 to be sent to [facility] on [address].  Pt's # [phone]

## 2022-07-19 ENCOUNTER — Ambulatory Visit: Payer: Medicaid Other | Admitting: Licensed Clinical Social Worker

## 2022-07-20 ENCOUNTER — Other Ambulatory Visit: Payer: Medicaid Other | Admitting: Licensed Clinical Social Worker

## 2022-07-20 ENCOUNTER — Ambulatory Visit (HOSPITAL_COMMUNITY): Payer: Commercial Managed Care - HMO | Attending: Orthopedic Surgery | Admitting: Physical Therapy

## 2022-07-20 DIAGNOSIS — M6281 Muscle weakness (generalized): Secondary | ICD-10-CM | POA: Diagnosis present

## 2022-07-20 DIAGNOSIS — M5459 Other low back pain: Secondary | ICD-10-CM | POA: Diagnosis present

## 2022-07-20 DIAGNOSIS — R2689 Other abnormalities of gait and mobility: Secondary | ICD-10-CM | POA: Diagnosis present

## 2022-07-20 DIAGNOSIS — M25562 Pain in left knee: Secondary | ICD-10-CM | POA: Insufficient documentation

## 2022-07-20 NOTE — Therapy (Signed)
OUTPATIENT PHYSICAL THERAPY TREATMENT   Patient Name: Judy Cross MRN: [id number] DOB:May 03, 1966, 57 y.o., female Today's Date: 07/06/2022  END OF SESSION:  PT End of Session - 07/06/22 0901     Visit Number 2    Number of Visits 12    Date for PT Re-Evaluation 08/02/22    Authorization Type [place] Medicaid UHC    Authorization Time Period Per UHC no auth, required for pts over the age of 16, visit limit 30 PT/OT/SLP    Authorization - Visit Number 2    Authorization - Number of Visits 30    Progress Note Due on Visit 10    PT Start Time 0902    PT Stop Time 0941    PT Time Calculation (min) 39 min    Activity Tolerance Patient tolerated treatment well    Behavior During Therapy [facility] for tasks assessed/performed             Past Medical History:  Diagnosis Date   Allergy    Anxiety 06/12/2019   Arthritis    Asthma in adult, mild intermittent, uncomplicated 6/31/4970   Breast nodule 10/12/2015   Breast nodule 10/12/2015   Bulge of lumbar disc without myelopathy    Endometrial polyp 04/28/2013   Enlarged uterus 03/26/2013   Fibroids 04/28/2013   Hot flashes 03/26/2013   Hot flashes 03/26/2013   Hypertension    Irregular bleeding 03/26/2013   Neuromuscular disorder ([place])    sciatica   Seasonal allergies    Seizures ([place])    last seizure was 2 years ago; unknown etiology. On Keppra.   Trichomoniasis 10/20/2020   tx 10/20/20  POC___   Past Surgical History:  Procedure Laterality Date   ABLATION     with polyp removal.   BREAST BIOPSY Left    neg   CESAREAN SECTION     x 3   COLONOSCOPY WITH PROPOFOL N/A 01/11/2016   Surgeon: Danie Binder, MD; mild diverticulosis in proximal ascending colon, nonbleeding internal hemorrhoids.  Repeat in 10 years.   EXAMINATION UNDER ANESTHESIA  02/28/2012   Procedure: EXAM UNDER ANESTHESIA;  Surgeon: Donato Heinz, MD;  Location: [facility];  Service: General;  Laterality: N/A;   EXCISION MASS UPPER EXTREMETIES Left 11/13/2019    Procedure: EXCISION MASS UPPER EXTREMETIES ring finger left;  Surgeon: Carole Civil, MD;  Location: [facility];  Service: Orthopedics;  Laterality: Left;   GANGLION CYST EXCISION Left    HYSTEROSCOPY WITH D & C N/A 05/23/2013   Procedure: DILATATION AND CURETTAGE /HYSTEROSCOPY;  Surgeon: Jonnie Kind, MD;  Location: [facility];  Service: Gynecology;  Laterality: N/A;   POLYPECTOMY N/A 05/23/2013   Procedure: ENDOMETRIAL POLYP REMOVAL;  Surgeon: Jonnie Kind, MD;  Location: [facility];  Service: Gynecology;  Laterality: N/A;   SPHINCTEROTOMY  02/28/2012   Procedure: SPHINCTEROTOMY;  Surgeon: Donato Heinz, MD;  Location: [facility];  Service: General;  Laterality: N/A;  Lateral Internal Sphincterotomy   TRIGGER FINGER RELEASE Left    ring finger   TUBAL LIGATION     Patient Active Problem List   Diagnosis Date Noted   Hypokalemia 03/22/2022   Vitamin D deficiency 03/22/2022   Fall 03/22/2022   Current moderate episode of major depressive disorder without prior episode ([place]) 03/21/2022   Tobacco abuse 01/06/2022   Spinal stenosis of lumbar region 06/17/2021   Mass of lower outer quadrant of left breast 05/09/2021   Arthritis of left knee 12/02/2020   Chronic left  shoulder pain 12/02/2020   Allergic sinusitis 12/02/2020   Mass of left finger    Anxiety 06/12/2019   Seizure disorder ([place]) 06/12/2019   Environmental and seasonal allergies 06/12/2019   Abnormal Pap smear of cervix 05/27/2019   Chronic midline low back pain with left-sided sciatica 01/12/2017   Essential hypertension 12/07/2016   Mild intermittent asthma without complication 95/02/3266    PCP: Ihor Dow MD  REFERRING PROVIDER: [name], MD  REFERRING DIAG: [phone] (ICD-10-CM) - Chronic pain of left knee  THERAPY DIAG:  Left knee pain, unspecified chronicity  Other low back pain  Muscle weakness (generalized)  Other abnormalities of gait and mobility  Other symptoms and signs involving the  musculoskeletal system  Rationale for Evaluation and Treatment: Rehabilitation  ONSET DATE: 5-6 years +  SUBJECTIVE:   SUBJECTIVE STATEMENT: Patient states back pain and running down Rt LE.  Lt knee is also hurting.  Reports 10/10 pain and hasn't improved.  When asked if her pain is high enough for ED pt responded she would probably go to ED when leaves therapy today.  PT reports doing her exercises some but mostly sitting and laying around.     PERTINENT HISTORY: Cyst in left knee; seizures, anxiety and depression, LBP, herniated disc in back PAIN:  Are you having pain? Yes: NPRS scale: 10/10 Pain location: back and down Rt LE Pain description: numb Aggravating factors: constant Relieving factors: none  PRECAUTIONS: None  WEIGHT BEARING RESTRICTIONS: No  FALLS:  Has patient fallen in last 6 months? Yes. Number of falls 7  LIVING ENVIRONMENT: Lives with: lives with their family Lives in: House/apartment Stairs: Yes: External: 1 steps; none Has following equipment at home: None  OCCUPATION: unemployed  PLOF: Independent  PATIENT GOALS: get back and leg feeling better  NEXT MD VISIT: Tomorrow  OBJECTIVE:   DIAGNOSTIC FINDINGS: XR 03/10/21 Impression: effusion left knee, mild degenerative joint disease, no fracture noted   PATIENT SURVEYS: LEFS 21/80  COGNITION: Overall cognitive status: Within functional limits for tasks assessed     SENSATION: WFL   POSTURE: rounded shoulders and forward head  PALPATION: TTP R quad, grossly throughout knee  LOWER EXTREMITY ROM:  Active ROM Right eval Left eval  Hip flexion    Hip extension    Hip abduction    Hip adduction    Hip internal rotation    Hip external rotation    Knee flexion 133 119  Knee extension 0 0  Ankle dorsiflexion    Ankle plantarflexion    Ankle inversion    Ankle eversion     (Blank rows = not tested)  LOWER EXTREMITY MMT:  MMT Right eval Left eval  Hip flexion 4+ 4  Hip  extension 4+ 4  Hip abduction 4+ 4  Hip adduction    Hip internal rotation    Hip external rotation    Knee flexion 5 4*  Knee extension 5 4*  Ankle dorsiflexion    Ankle plantarflexion    Ankle inversion    Ankle eversion     (Blank rows = not tested) LUMBAR ROM:    Active  AROM  eval   Flexion 0% limited   Extension 25% limited   Right lateral flexion 0% limited    Left lateral flexion  0% limited   Right rotation     Left rotation     (Blank rows = not tested)    FUNCTIONAL TESTS:  5 times sit to stand: 10.3 seconds without UE  use, equal bilateral LE use, dynamic knee valgus bilaterally 2 minute walk test: 185 feet  GAIT: Distance walked: 185 feet Assistive device utilized: None Level of assistance: Modified independence Comments: 2MWT, antalgic on L with L knee flexed in stance, limited L knee flexion extension, intermittent unsteadiness without loss of balance   TODAY'S TREATMENT:                                                                                                                              DATE:  07/20/22 Standing:  slant board stretch 3X30" Heelraises 20X on incline 4" lateral step ups 20X each LE 4" forward step ups 20X each LE Hip abduction RTB at ankles 2x 10 bilateral  Hip extension RTB at ankles 2X10 bilateral  Lateral stepping 3RT on 20 foot line  Forward lunges 20X each no UE onto 4" step  Lateral lunges onto 4" step 20X each no UE Gait around clinic without AD 200 feet, no antalgia noted.  07/06/22 Nustep - seat 7, level 2, 5 minutes for dynamic warm up and conditioning SLR 2 x 10 bilateral  Sidelying hip abduction 2 x 10 bilateral  LAQ x 10 with 5 second holds bilateral  Calf stretch on slant board 3 x 20-30 second holds Standing HR 1 x 15 Standing TR 1 x 15 Lateral stepping 6 x 10 feet bilateral Step up 4 inch 3 x 10 bilateral  Standing hip abduction RTB at knees 2x 10 bilateral   06/21/22 SLR x 10 LAQ x 10 with 5 second holds     PATIENT EDUCATION:  Education details: 07/06/22: HEP; EVAL: Patient educated on exam findings, POC, scope of PT, HEP Person educated: Patient Education method: Explanation, Demonstration, and Handouts Education comprehension: verbalized understanding, returned demonstration, verbal cues required, and tactile cues required  HOME EXERCISE PROGRAM: Access Code: [email or web address] URL: [email or web address] 07/06/22 - Sidelying Hip Abduction  - 1 x daily - 7 x weekly - 2 sets - 10 reps - Standing Hip Abduction with Resistance at Ankles and Counter Support  - 1 x daily - 7 x weekly - 2 sets - 10 reps  Date: 06/21/2022 - Supine Active Straight Leg Raise  - 2-3 x daily - 7 x weekly - 2 sets - 10 reps - Seated Long Arc Quad  - 2-3 x daily - 7 x weekly - 2 sets - 10 reps - 5 second hold  ASSESSMENT:  CLINICAL IMPRESSION: Continued with established therex.  Added stability exercises without UE assist with ability to complete without difficulty.  Pt did not require any rest breaks and no painful deviations with movements noted.  Pt able to complete all without complaints of pain and remain jovial despite 10/10 pain rating.  Encouraged to increase her activity at home as laying around is worse for her pain.  Pt reports she has to do this and limit her activity due to tendency to have seizures.  Ended with ambulation around clinic  without AD or antalgia.  Pt tends to present with more deviation/antalgia when using SPC vs no AD.  Informed to make appt when leaving facility as she has no further ones scheduled due to [facility] policy.  Reminded to call and cancel if she is unable to make her appts.  Patient will continue to benefit from physical therapy in order to improve function and reduce impairment.   OBJECTIVE IMPAIRMENTS: Abnormal gait, decreased activity tolerance, decreased balance, decreased endurance, decreased mobility, difficulty walking, decreased ROM, decreased strength, increased muscle  spasms, impaired flexibility, improper body mechanics, and pain.   ACTIVITY LIMITATIONS: carrying, lifting, bending, standing, squatting, stairs, transfers, locomotion level, and caring for others  PARTICIPATION LIMITATIONS: meal prep, cleaning, laundry, shopping, community activity, occupation, and yard work  PERSONAL FACTORS: Fitness, Time since onset of injury/illness/exacerbation, and 3+ comorbidities: Cyst in left knee; seizures, anxiety and depression, LBP, herniated disc in back  are also affecting patient's functional outcome.   REHAB POTENTIAL: Good  CLINICAL DECISION MAKING: Stable/uncomplicated  EVALUATION COMPLEXITY: Low   GOALS: Goals reviewed with patient? Yes  SHORT TERM GOALS: Target date: 07/12/2022    Patient will be independent with HEP in order to improve functional outcomes. Baseline: Goal status: INITIAL  2.  Patient will report at least 25% improvement in symptoms for improved quality of life. Baseline: Goal status: INITIAL    LONG TERM GOALS: Target date: 08/02/22  Patient will report at least 75% improvement in symptoms for improved quality of life. Baseline:  Goal status: INITIAL  2.  Patient will improve FOTO score by at least 9 points in order to indicate improved tolerance to activity. Baseline: 21/80 Goal status: INITIAL  3.  Patient will be able to navigate stairs with reciprocal pattern without compensation in order to demonstrate improved LE strength. Baseline: unable Goal status: INITIAL  4.  Patient will be able to ambulate at least 226 feet in 2MWT in order to demonstrate improved tolerance to activity. Baseline: 135 feet Goal status: INITIAL  5.  Patient will demonstrate grade of 5/5 MMT grade in all tested musculature as evidence of improved strength to assist with stair ambulation and gait.   Baseline: see above Goal status: INITIAL   PLAN:  PT FREQUENCY: 2x/week  PT DURATION: 6 weeks  PLANNED INTERVENTIONS: Therapeutic  exercises, Therapeutic activity, Neuromuscular re-education, Balance training, Gait training, Patient/Family education, Joint manipulation, Joint mobilization, Stair training, Orthotic/Fit training, DME instructions, Aquatic Therapy, Dry Needling, Electrical stimulation, Spinal manipulation, Spinal mobilization, Cryotherapy, Moist heat, Compression bandaging, scar mobilization, Splintting, Taping, Traction, Ultrasound, Ionotophoresis '4mg'$ /ml Dexamethasone, and Manual therapy  PLAN FOR NEXT SESSION: quad strength, glute strength, core strength; f/u with HEP, progress as able, gait/balance training   Teena Irani, PTA/CLT [place] Ph: [phone]  Teena Irani, PTA 07/20/2022, 10:32 AM

## 2022-07-20 NOTE — Patient Outreach (Signed)
  Medicaid Managed Care   Unsuccessful Attempt Note   07/20/2022 Name: Judy Cross MRN: [id number] DOB: Feb 09, 1966  Referred by: Lindell Spar, MD Reason for referral : No chief complaint on file.   An unsuccessful telephone outreach was attempted today. The patient was referred to the case management team for assistance with care management and care coordination.    Follow Up Plan: A HIPAA compliant phone message was left for the patient providing contact information and requesting a return call.   Eula Fried, BSW, MSW, [employer] Managed Medicaid LCSW [email or web address] Phone: [phone]

## 2022-07-21 ENCOUNTER — Other Ambulatory Visit (HOSPITAL_COMMUNITY): Payer: Self-pay

## 2022-07-24 ENCOUNTER — Telehealth: Payer: Self-pay | Admitting: Orthopaedic Surgery

## 2022-07-24 MED ORDER — HYDROCODONE-ACETAMINOPHEN 5-325 MG PO TABS: ORAL_TABLET | ORAL | 0 refills | Status: DC

## 2022-07-24 NOTE — Telephone Encounter (Signed)
Sent to provider 

## 2022-07-24 NOTE — Telephone Encounter (Signed)
Patient is requesting a refill on her Hydrocodone 5-325 to be sent to [facility] on [address].  She also made an appointment w/Dr. Aline Brochure per Dr. Luna Glasgow.  She stated she didn't stop at checkout at her last visit so she didn't get an appointment then.

## 2022-07-24 NOTE — Telephone Encounter (Signed)
Returned the pt's call, lvm for her to call me back

## 2022-07-25 ENCOUNTER — Encounter (HOSPITAL_COMMUNITY): Payer: Medicaid Other | Admitting: Physical Therapy

## 2022-07-25 ENCOUNTER — Telehealth: Payer: Self-pay | Admitting: Orthopedic Surgery

## 2022-07-25 NOTE — Telephone Encounter (Signed)
Returned the pt's call, lvm for her to call us.

## 2022-07-26 ENCOUNTER — Other Ambulatory Visit: Payer: Commercial Managed Care - HMO | Admitting: Pharmacist

## 2022-07-27 ENCOUNTER — Encounter: Payer: Self-pay | Admitting: Internal Medicine

## 2022-07-27 ENCOUNTER — Ambulatory Visit (INDEPENDENT_AMBULATORY_CARE_PROVIDER_SITE_OTHER): Payer: Commercial Managed Care - HMO | Admitting: Internal Medicine

## 2022-07-27 VITALS — BP 148/84 | HR 79 | Ht 61.0 in | Wt 137.4 lb

## 2022-07-27 DIAGNOSIS — I10 Essential (primary) hypertension: Secondary | ICD-10-CM | POA: Diagnosis not present

## 2022-07-27 DIAGNOSIS — G40909 Epilepsy, unspecified, not intractable, without status epilepticus: Secondary | ICD-10-CM

## 2022-07-27 DIAGNOSIS — Z0001 Encounter for general adult medical examination with abnormal findings: Secondary | ICD-10-CM

## 2022-07-27 DIAGNOSIS — Z23 Encounter for immunization: Secondary | ICD-10-CM | POA: Diagnosis not present

## 2022-07-27 DIAGNOSIS — R7303 Prediabetes: Secondary | ICD-10-CM

## 2022-07-27 DIAGNOSIS — K219 Gastro-esophageal reflux disease without esophagitis: Secondary | ICD-10-CM

## 2022-07-27 DIAGNOSIS — E559 Vitamin D deficiency, unspecified: Secondary | ICD-10-CM

## 2022-07-27 DIAGNOSIS — F321 Major depressive disorder, single episode, moderate: Secondary | ICD-10-CM

## 2022-07-27 DIAGNOSIS — J309 Allergic rhinitis, unspecified: Secondary | ICD-10-CM

## 2022-07-27 MED ORDER — HYDROCHLOROTHIAZIDE 25 MG PO TABS: 25.0000 mg | ORAL_TABLET | Freq: Every day | ORAL | 3 refills | Status: DC

## 2022-07-27 MED ORDER — VITAMIN D (ERGOCALCIFEROL) 1.25 MG (50000 UNIT) PO CAPS: 50000.0000 [IU] | ORAL_CAPSULE | ORAL | 1 refills | Status: DC

## 2022-07-27 MED ORDER — LEVOCETIRIZINE DIHYDROCHLORIDE 5 MG PO TABS: 5.0000 mg | ORAL_TABLET | Freq: Every evening | ORAL | 3 refills | Status: DC

## 2022-07-27 MED ORDER — SERTRALINE HCL 50 MG PO TABS: 50.0000 mg | ORAL_TABLET | Freq: Every day | ORAL | 1 refills | Status: AC

## 2022-07-27 MED ORDER — OMEPRAZOLE 20 MG PO CPDR: 20.0000 mg | DELAYED_RELEASE_CAPSULE | Freq: Every day | ORAL | 1 refills | Status: DC

## 2022-07-27 NOTE — Assessment & Plan Note (Signed)
Her episodes of syncope likely due to seizures On Keppra Followed by Neurology Advised to go to ER if she has any episode of syncope or seizure

## 2022-07-27 NOTE — Assessment & Plan Note (Signed)
[place] Office Visit from 07/27/2022 in [facility]  PHQ-9 Total Score 20      Uncontrolled, likely due to her medical problems including back and knee pain limiting her functional capacity On Zoloft 25 mg daily, increased dose to 50 mg QD Her other chronic medical conditions also contributing to her symptoms

## 2022-07-27 NOTE — Addendum Note (Signed)
Addended by: Everett Graff D on: 07/27/2022 09:00 AM   Modules accepted: Orders

## 2022-07-27 NOTE — Assessment & Plan Note (Addendum)
Headache likely due to frontal sinusitis, chronic - likely allergic Refilled Levocetirizine

## 2022-07-27 NOTE — Progress Notes (Signed)
Established Patient Office Visit  Subjective:  Patient ID: Judy Cross, female    DOB: 1965/07/26  Age: 57 y.o. MRN: [id number]  CC:  Chief Complaint  Patient presents with   Annual Exam    Patient is going through a lot, she feels hopeless.    HPI Judy Cross is a 57 y.o. female with past medical history of HTN, asthma, seizure disorder, lumbar spinal stenosis, tobacco abuse and OA of knee who presents for annual physical.  HTN: Her BP was elevated today.  Of note, she has run out of her medicines due to financial constraints.  She has intermittent headache, but denies any chest pain, dyspnea or palpitations.  Denies any visual disturbance.  Seizure disorder: Followed by neurology.  Has started taking Keppra again.  MDD: She reports anhedonia, anxiety, insomnia, fatigue and feeling hopeless.  She was placed on Zoloft 25 mg daily, but has not been able to continue it due to financial constraints.  She has not been able to work due to chronic low back and knee pain, which has been making her more depressed.    Past Medical History:  Diagnosis Date   Allergy    Anxiety 06/12/2019   Arthritis    Asthma in adult, mild intermittent, uncomplicated [id number]   Breast nodule 10/12/2015   Breast nodule 10/12/2015   Bulge of lumbar disc without myelopathy    Endometrial polyp 04/28/2013   Enlarged uterus 03/26/2013   Fibroids 04/28/2013   Hot flashes 03/26/2013   Hot flashes 03/26/2013   Hypertension    Irregular bleeding 03/26/2013   Neuromuscular disorder ([place])    sciatica   Seasonal allergies    Seizures ([place])    last seizure was 2 years ago; unknown etiology. On Keppra.   Trichomoniasis 10/20/2020   tx 10/20/20  POC___    Past Surgical History:  Procedure Laterality Date   ABLATION     with polyp removal.   BREAST BIOPSY Left    neg   CESAREAN SECTION     x 3   COLONOSCOPY WITH PROPOFOL N/A 01/11/2016   Surgeon: Danie Binder, MD; mild diverticulosis in  proximal ascending colon, nonbleeding internal hemorrhoids.  Repeat in 10 years.   EXAMINATION UNDER ANESTHESIA  02/28/2012   Procedure: EXAM UNDER ANESTHESIA;  Surgeon: Donato Heinz, MD;  Location: [facility];  Service: General;  Laterality: N/A;   EXCISION MASS UPPER EXTREMETIES Left 11/13/2019   Procedure: EXCISION MASS UPPER EXTREMETIES ring finger left;  Surgeon: Carole Civil, MD;  Location: [facility];  Service: Orthopedics;  Laterality: Left;   GANGLION CYST EXCISION Left    HYSTEROSCOPY WITH D & C N/A 05/23/2013   Procedure: DILATATION AND CURETTAGE /HYSTEROSCOPY;  Surgeon: Jonnie Kind, MD;  Location: [facility];  Service: Gynecology;  Laterality: N/A;   POLYPECTOMY N/A 05/23/2013   Procedure: ENDOMETRIAL POLYP REMOVAL;  Surgeon: Jonnie Kind, MD;  Location: [facility];  Service: Gynecology;  Laterality: N/A;   SPHINCTEROTOMY  02/28/2012   Procedure: SPHINCTEROTOMY;  Surgeon: Donato Heinz, MD;  Location: [facility];  Service: General;  Laterality: N/A;  Lateral Internal Sphincterotomy   TRIGGER FINGER RELEASE Left    ring finger   TUBAL LIGATION      Family History  Problem Relation Age of Onset   Hypertension Mother    Arthritis Mother    COPD Mother    Diabetes Mother    Heart disease Mother    Hypertension Father  Parkinson's disease Father    Colon cancer Neg Hx     Social History   Socioeconomic History   Marital status: Single    Spouse name: Not on file   Number of children: 3   Years of education: 14   Highest education level: Not on file  Occupational History   Occupation: home health aid  Tobacco Use   Smoking status: Some Days    Packs/day: 0.25    Years: 20.00    Total pack years: 5.00    Types: Cigarettes   Smokeless tobacco: Never   Tobacco comments:    smokes 1 cig daily  Vaping Use   Vaping Use: Never used  Substance and Sexual Activity   Alcohol use: Yes    Comment: occasional   Drug use: No   Sexual activity: Yes    Birth  control/protection: Surgical    Comment: tubal and ablation  Other Topics Concern   Not on file  Social History Narrative   Degree in child care   Currently is a home health aid   Lives at home with youngest daughter Jeanella Craze senior this year    Son 24-Jalon  in college for sociology    Daughter Saucier      Enjoys: write, reading, cooking, drawing      Diet: eats all food groups   Caffeine: pepsi weekly    Water: 6-8 cups    Juice some times      Wears seat belt    Smoke detectors    Does not use phone with driving.   Social Determinants of Health   Financial Resource Strain: High Risk (05/30/2022)   Overall Financial Resource Strain (CARDIA)    Difficulty of Paying Living Expenses: Hard  Food Insecurity: No Food Insecurity (07/20/2021)   Hunger Vital Sign    Worried About Running Out of Food in the Last Year: Never true    Ran Out of Food in the Last Year: Never true  Transportation Needs: No Transportation Needs (06/16/2022)   PRAPARE - Hydrologist (Medical): No    Lack of Transportation (Non-Medical): No  Physical Activity: Sufficiently Active (10/12/2020)   Exercise Vital Sign    Days of Exercise per Week: 5 days    Minutes of Exercise per Session: 40 min  Stress: Stress Concern Present (07/10/2022)   [place]    Feeling of Stress : Very much  Social Connections: Moderately Isolated (10/12/2020)   Social Connection and Isolation Panel [NHANES]    Frequency of Communication with Friends and Family: Three times a week    Frequency of Social Gatherings with Friends and Family: Once a week    Attends Religious Services: More than 4 times per year    Active Member of [employer] or Organizations: No    Attends Archivist Meetings: Never    Marital Status: Never married  Intimate Partner Violence: Not At Risk (10/12/2020)   Humiliation, Afraid, Rape,  and Kick questionnaire    Fear of Current or Ex-Partner: No    Emotionally Abused: No    Physically Abused: No    Sexually Abused: No    Outpatient Medications Prior to Visit  Medication Sig Dispense Refill   levETIRAcetam (KEPPRA) 750 MG tablet Take 1 tablet (750 mg total) by mouth 2 (two) times daily. 120 tablet 11   albuterol (VENTOLIN HFA) 108 (90 Base) MCG/ACT inhaler Inhale 2  puffs into the lungs every 6 (six) hours as needed for wheezing or shortness of breath. 8 g 11   HYDROcodone-acetaminophen (NORCO/VICODIN) 5-325 MG tablet One tablet every six hours for pain.  Limit 5 days. 11 tablet 0   hydrochlorothiazide (HYDRODIURIL) 25 MG tablet Take 1 tablet (25 mg total) by mouth daily. 90 tablet 3   levocetirizine (XYZAL) 5 MG tablet Take 1 tablet (5 mg total) by mouth every evening. 90 tablet 1   omeprazole (PRILOSEC) 20 MG capsule Take 1 capsule (20 mg total) by mouth daily. 90 capsule 1   sertraline (ZOLOFT) 25 MG tablet Take 1 tablet (25 mg total) by mouth daily. 90 tablet 1   Vitamin D, Ergocalciferol, (DRISDOL) 1.25 MG (50000 UNIT) CAPS capsule Take 1 capsule (50,000 Units total) by mouth every 7 (seven) days. 12 capsule 1   No facility-administered medications prior to visit.    Allergies  Allergen Reactions   Lisinopril Swelling    swelling to entire mouth.   Penicillins Rash    Has patient had a PCN reaction causing immediate rash, facial/tongue/throat swelling, SOB or lightheadedness with hypotension: no - next day Has patient had a PCN reaction causing severe rash involving mucus membranes or skin necrosis: No Has patient had a PCN reaction that required hospitalization No Has patient had a PCN reaction occurring within the last 10 years: Yes If all of the above answers are "NO", then may proceed with Cephalosporin use.    Tramadol Other (See Comments)    dizziness    ROS Review of Systems  Constitutional:  Positive for fatigue. Negative for chills and fever.   HENT:  Negative for postnasal drip, sinus pressure, sinus pain, sore throat and trouble swallowing.   Eyes:  Negative for pain and discharge.  Respiratory:  Negative for cough and shortness of breath.   Cardiovascular:  Negative for chest pain and palpitations.  Gastrointestinal:  Negative for diarrhea, nausea and vomiting.  Genitourinary:  Negative for dysuria and hematuria.  Musculoskeletal:  Positive for arthralgias, back pain and neck pain.  Skin:  Negative for rash.  Neurological:  Positive for seizures and syncope. Negative for weakness and numbness.  Psychiatric/Behavioral:  Positive for dysphoric mood and sleep disturbance. Negative for agitation and behavioral problems. The patient is nervous/anxious.       Objective:    Physical Exam Vitals reviewed.  Constitutional:      General: She is not in acute distress.    Appearance: She is not diaphoretic.  HENT:     Head: Normocephalic and atraumatic.     Nose: Nose normal.     Mouth/Throat:     Mouth: Mucous membranes are moist.  Eyes:     General: No scleral icterus.    Extraocular Movements: Extraocular movements intact.  Cardiovascular:     Rate and Rhythm: Normal rate and regular rhythm.     Pulses: Normal pulses.     Heart sounds: Normal heart sounds. No murmur heard. Pulmonary:     Breath sounds: Normal breath sounds. No wheezing or rales.  Musculoskeletal:        General: Tenderness (Left knee, with minimal swelling) present.     Cervical back: Neck supple. No tenderness. Decreased range of motion.     Lumbar back: Tenderness present. Decreased range of motion.     Right lower leg: No edema.     Left lower leg: No edema.  Skin:    General: Skin is warm.     Findings: No  rash.  Neurological:     General: No focal deficit present.     Mental Status: She is alert and oriented to person, place, and time.     Sensory: No sensory deficit.     Motor: Weakness (B/l LE - 3/5) present.  Psychiatric:        Mood and  Affect: Mood is depressed.        Behavior: Behavior normal.     BP (!) 148/84 (BP Location: Right Arm, Cuff Size: Normal)   Pulse 79   Ht 5' 1"$  (1.549 m)   Wt 137 lb 6.4 oz (62.3 kg)   SpO2 97%   BMI 25.96 kg/m  Wt Readings from Last 3 Encounters:  07/27/22 137 lb 6.4 oz (62.3 kg)  05/27/22 135 lb (61.2 kg)  04/17/22 124 lb (56.2 kg)    Lab Results  Component Value Date   TSH 0.634 03/21/2022   Lab Results  Component Value Date   WBC 5.9 04/15/2022   HGB 12.9 04/15/2022   HCT 37.8 04/15/2022   MCV 95.7 04/15/2022   PLT 229 04/15/2022   Lab Results  Component Value Date   NA 141 04/15/2022   K 3.1 (L) 04/15/2022   CO2 25 04/15/2022   GLUCOSE 112 (H) 04/15/2022   BUN 13 04/15/2022   CREATININE 0.80 04/15/2022   BILITOT 1.1 03/21/2022   ALKPHOS 73 03/21/2022   AST 19 03/21/2022   ALT 13 03/21/2022   PROT 7.4 03/21/2022   ALBUMIN 4.8 03/21/2022   CALCIUM 9.1 04/15/2022   ANIONGAP 8 04/15/2022   EGFR 89 03/21/2022   Lab Results  Component Value Date   CHOL 193 03/21/2022   Lab Results  Component Value Date   HDL 68 03/21/2022   Lab Results  Component Value Date   LDLCALC 75 03/21/2022   Lab Results  Component Value Date   TRIG 317 (H) 03/21/2022   Lab Results  Component Value Date   CHOLHDL 2.8 03/21/2022   Lab Results  Component Value Date   HGBA1C 6.0 (H) 03/21/2022      Assessment & Plan:   Problem List Items Addressed This Visit       Cardiovascular and Mediastinum   Essential hypertension    BP Readings from Last 1 Encounters:  07/27/22 (!) 148/84  Usually well-controlled with HCTZ, but has run out of her meds, had financial constraints Counseled for compliance with the medications Advised DASH diet and moderate exercise/walking as tolerated       Relevant Medications   hydrochlorothiazide (HYDRODIURIL) 25 MG tablet   Other Relevant Orders   Basic Metabolic Panel (BMET)     Respiratory   Allergic sinusitis    Headache  likely due to frontal sinusitis, chronic - likely allergic Refilled Levocetirizine      Relevant Medications   levocetirizine (XYZAL) 5 MG tablet     Nervous and Auditory   Seizure disorder (H[facility])    Her episodes of syncope likely due to seizures On Keppra Followed by Neurology Advised to go to ER if she has any episode of syncope or seizure        Other   Current moderate episode of major depressive disorder without prior episode ([place])    [place] Office Visit from 07/27/2022 in [facility]  PHQ-9 Total Score 20     Uncontrolled, likely due to her medical problems including back and knee pain limiting her functional capacity On Zoloft 25 mg daily, increased  dose to 50 mg QD Her other chronic medical conditions also contributing to her symptoms      Relevant Medications   sertraline (ZOLOFT) 50 MG tablet   Vitamin D deficiency   Relevant Medications   Vitamin D, Ergocalciferol, (DRISDOL) 1.25 MG (50000 UNIT) CAPS capsule   Encounter for general adult medical examination with abnormal findings - Primary    Physical exam as documented. Fasting blood tests reviewed. Shingrix first dose today.      Other Visit Diagnoses     Gastroesophageal reflux disease, unspecified whether esophagitis present       Relevant Medications   omeprazole (PRILOSEC) 20 MG capsule   Prediabetes       Relevant Orders   Hemoglobin A1c       Meds ordered this encounter  Medications   sertraline (ZOLOFT) 50 MG tablet    Sig: Take 1 tablet (50 mg total) by mouth daily.    Dispense:  90 tablet    Refill:  1   hydrochlorothiazide (HYDRODIURIL) 25 MG tablet    Sig: Take 1 tablet (25 mg total) by mouth daily.    Dispense:  90 tablet    Refill:  3   levocetirizine (XYZAL) 5 MG tablet    Sig: Take 1 tablet (5 mg total) by mouth every evening.    Dispense:  90 tablet    Refill:  3   omeprazole (PRILOSEC) 20 MG capsule    Sig: Take 1 capsule (20 mg total) by  mouth daily.    Dispense:  90 capsule    Refill:  1   Vitamin D, Ergocalciferol, (DRISDOL) 1.25 MG (50000 UNIT) CAPS capsule    Sig: Take 1 capsule (50,000 Units total) by mouth every 7 (seven) days.    Dispense:  12 capsule    Refill:  1    Follow-up: Return in about 6 weeks (around 09/07/2022).    Lindell Spar, MD

## 2022-07-27 NOTE — Patient Instructions (Addendum)
Please start taking Zoloft 50 mg as prescribed.  Please start taking other medications as prescribed.  Please follow low salt diet and ambulate as tolerated.  Please get blood tests before the next visit.

## 2022-07-27 NOTE — Assessment & Plan Note (Signed)
BP Readings from Last 1 Encounters:  07/27/22 (!) 148/84   Usually well-controlled with HCTZ, but has run out of her meds, had financial constraints Counseled for compliance with the medications Advised DASH diet and moderate exercise/walking as tolerated

## 2022-07-27 NOTE — Assessment & Plan Note (Signed)
Physical exam as documented. Fasting blood tests reviewed. Shingrix first dose today.

## 2022-07-28 ENCOUNTER — Telehealth: Payer: Self-pay

## 2022-07-28 ENCOUNTER — Encounter (HOSPITAL_COMMUNITY): Payer: Medicaid Other

## 2022-07-28 NOTE — Telephone Encounter (Signed)
..   Medicaid Managed Care   Unsuccessful Outreach Note  07/28/2022 Name: Judy Cross MRN: [id number] DOB: [birth year]  Referred by: Lindell Spar, MD Reason for referral : Appointment (Called to reschedule her missed phone appt with the [facility] LCSW)   A second unsuccessful telephone outreach was attempted today. The patient was referred to the case management team for assistance with care management and care coordination.   Follow Up Plan: A HIPAA compliant phone message was left for the patient providing contact information and requesting a return call.  The care management team will reach out to the patient again over the next 7 days.   [place]

## 2022-07-31 ENCOUNTER — Ambulatory Visit (INDEPENDENT_AMBULATORY_CARE_PROVIDER_SITE_OTHER): Payer: Commercial Managed Care - HMO | Admitting: Orthopedic Surgery

## 2022-07-31 DIAGNOSIS — G8929 Other chronic pain: Secondary | ICD-10-CM | POA: Diagnosis not present

## 2022-07-31 DIAGNOSIS — M5442 Lumbago with sciatica, left side: Secondary | ICD-10-CM | POA: Diagnosis not present

## 2022-07-31 NOTE — Progress Notes (Signed)
Chief Complaint  Patient presents with   Follow-up    Recheck on left knee   The patient was seen by Dr. Luna Glasgow and referred back to me for left knee pain but has a normal MRI  She complains of left knee pain and left leg radicular pain with numbness and tingling running from the back and hip down into the left foot.  Her left leg gives way.  She uses a cane and a brace to help support the knee  I sent her for an MRI back in August but she could not have it because insurance required physical therapy which she has completed  I have asked for a new MRI to be done  However, with her normal knee MRI there is no need for knee surgery  She will follow-up with Dr. Luna Glasgow with the MRI results  We note from prior MRIs of her spine In 2016  L4-5: Desiccation of the disc with endplate osteophytes and shallow protrusion of the disc. Mild facet and ligamentous hypertrophy. Mild stenosis of both lateral recesses. Definite neural compression is not established but neural irritation could occur.   L5-S1: Degeneration of the disc with endplate osteophytes and bulging of the disc. Mild facet degeneration. No apparent canal stenosis. Mild foraminal narrowing bilaterally that could cause neural irritation, but without definite nerve compression.   Since the previous study, the changes at L4-5 are quite similar. The disc at L5-S1 has actually involuted slightly.   IMPRESSION: L4-5: Chronic degenerative disc disease with endplate osteophytes and shallow disc protrusion. Mild facet and ligamentous hypertrophy. Mild stenosis of the lateral recesses that could possibly cause neural irritation. Similar appearance to the study of last year.   L5-S1: Involution of a shallow disc protrusion. Mild foraminal stenosis bilaterally that would have some potential to irritate the exiting L5 nerve roots.    Second knee MRI after fall dated May 07, 2021  CLINICAL DATA:  Recent fall. Knee trauma.  Meniscal/ligament injury suspected.   EXAM: MRI OF THE LEFT KNEE WITHOUT CONTRAST   TECHNIQUE: Multiplanar, multisequence MR imaging of the knee was performed. No intravenous contrast was administered.   COMPARISON:  Radiographs 03/10/2021.  MRI 11/19/2019.   FINDINGS: MENISCI   Medial meniscus:  Intact with normal morphology.   Lateral meniscus:  Intact with normal morphology.   LIGAMENTS   Cruciates:  Intact.   Collaterals:  Intact.   CARTILAGE   Patellofemoral: The patellar cartilage is preserved. There is minimal central trochlear chondromalacia.   Medial:  Preserved.   Lateral:  Preserved.   MISCELLANEOUS   Joint:  No significant joint effusion.   Popliteal Fossa: The popliteus muscle and tendon are intact. No significant Baker's cyst.   Extensor Mechanism: Intact. Mild patella alta again noted with mild edema superolaterally in Hoffa's fat. The patellar retinacula and medial patellofemoral ligament appear intact.   Bones: No acute osseous findings. No signs of transient patellar dislocation injury. There is a shallow femoral trochlear groove. The tibial tubercle/trochlear groove (TT-TG) distance is 8 mm (normal).   Other: No other significant periarticular soft tissue findings.   IMPRESSION: 1. No acute findings or evidence of internal derangement. The menisci, cruciate and collateral ligaments remain intact. 2. 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 No signs of transient patellar dislocation injury.     Electronically Signed   By: Richardean Sale M.D.   On: 05/07/2021 10:47  Per request of Christella Scheuermann they requested a  detailed physical and neurological exam based on the patient's current set of symptoms  They also wanted 6 weeks of  Conservative treatment within the last 12 weeks followed by reevaluation  Note the patient has had physical therapy with the [facility] outpatient  physical therapy department from March 21, 2022 to July 20, 2022  As for the examination please include the following which was omitted  Physical Exam Constitutional:      Appearance: Normal appearance. She is normal weight. She is ill-appearing.  Musculoskeletal:     Comments: The patient has tenderness and increased muscle spasm across the lower back with worsening signs on the left  Right leg straight leg raise negative left leg positive  Dermatomal distribution radiculopathy and numbness L4-5 dermatomes  Dorsiflexion ankle 4 out of 5 versus 5 out of 5 left to right plantarflexion normal    Neurological:     Mental Status: She is alert and oriented to person, place, and time.     Sensory: Sensory deficit present.     Motor: Weakness present. No tremor, atrophy or abnormal muscle tone.     Coordination: Coordination abnormal. Finger-Nose-Finger Test normal.     Gait: Gait abnormal.     Deep Tendon Reflexes: Babinski sign absent on the right side. Babinski sign absent on the left side.     Reflex Scores:      Patellar reflexes are 2+ on the right side and 1+ on the left side.      Achilles reflexes are 2+ on the right side and 1+ on the left side.

## 2022-07-31 NOTE — Patient Instructions (Signed)
While we are working on your approval for MRI please go ahead and call to schedule your appointment with [facility] within at least one (1) week.   Central Scheduling [phone]  

## 2022-08-01 ENCOUNTER — Telehealth: Payer: Self-pay | Admitting: Orthopaedic Surgery

## 2022-08-01 ENCOUNTER — Encounter (HOSPITAL_COMMUNITY): Payer: Medicaid Other | Admitting: Physical Therapy

## 2022-08-01 ENCOUNTER — Ambulatory Visit: Payer: Medicaid Other | Admitting: Neurology

## 2022-08-01 MED ORDER — HYDROCODONE-ACETAMINOPHEN 5-325 MG PO TABS: ORAL_TABLET | ORAL | 0 refills | Status: DC

## 2022-08-01 NOTE — Telephone Encounter (Signed)
Patient lvm stating it was time for her refill for her Hydrocodone.  She wants it sent to [facility] on [address].  Pt's # [phone]

## 2022-08-02 ENCOUNTER — Encounter (HOSPITAL_COMMUNITY): Payer: Medicaid Other | Admitting: Physical Therapy

## 2022-08-02 NOTE — Progress Notes (Signed)
   07/26/2022 Name: Judy Cross MRN: [id number] DOB: 1966/05/14   Pharmacy services spoke with [name] and confirmed she now has Medicaid and is able to afford her medications.  She has a PCP appt tomorrow.  PharmD to follow up blood pressure and medication status in 3-4 weeks.   Regina Eck, PharmD, BCPS, BCACP Clinical Pharmacist, [facility]

## 2022-08-03 ENCOUNTER — Other Ambulatory Visit: Payer: Commercial Managed Care - HMO | Admitting: Licensed Clinical Social Worker

## 2022-08-03 ENCOUNTER — Encounter (HOSPITAL_COMMUNITY): Payer: Medicaid Other | Admitting: Physical Therapy

## 2022-08-03 ENCOUNTER — Encounter: Payer: Self-pay | Admitting: Orthopaedic Surgery

## 2022-08-03 NOTE — Progress Notes (Signed)
PLEASE SEE DOCUMENTATION NOTE BELOW. DR.HARRISON WOULD LIKE FOR YOU TO SEE PATIENT AFTER MRI LUMBAR SPINE FOR CONTINUITY OF CARE. KNEE WAS OK.   CASE FOR MRI LUMBAR HAS BEEN SUBMITTED WAITING ON MEDICAL REVIEW FOR [employer].

## 2022-08-03 NOTE — Patient Instructions (Signed)
Visit Information  Judy Cross was given information about Medicaid Managed Care team care coordination services as a part of their [place] [employer] benefit. Judy Cross verbally consented to engagement with the [facility] Managed Care team.   If you are experiencing a medical emergency, please call 911 or report to your local emergency department or urgent care.   If you have a non-emergency medical problem during routine business hours, please contact your provider's office and ask to speak with a nurse.   For questions related to your [facility], please call: [phone] or visit the homepage here: [email or web address]  If you would like to schedule transportation through your [facility], please call the following number at least 2 days in advance of your appointment: [phone]   Rides for urgent appointments can also be made after hours by calling Member Services.  Call the [place] at [phone], at any time, 24 hours a day, 7 days a week. If you are in danger or need immediate medical attention call 911.  If you would like help to quit smoking, call 1-800-QUIT-NOW 207-067-5012) OR Espaol: 1-855-Djelo-Ya HD:1601594) o para ms informacin haga clic aqu or Text READY to 200-400 to register via text   Following is a copy of your plan of care:  Care Plan : LCSW Plan of Care  Updates made by Greg Cutter, LCSW since 08/03/2022 12:00 AM     Problem: Coping Skills for symptoms of Anxiety      Goal: Coping Skills Enhanced by connecting for ongoing therapy   Start Date: 07/20/2021  Recent Progress: On track  Priority: High  Note:   Current Barriers:  Disease Management support and education needs related to Anxiety with Panic Symptoms, and Stress at home  CSW Clinical Goal(s):  Patient  will work with [facility] LCSW to  address needs related to managing symptoms of anxiety  through collaboration with Clinical Social Worker, provider, and care team Patient will gain counseling and psychiatry services to help increase mental health support Patient Self-Care Activities: Practice relaxed breathing 3 times a day     24- Hour Availability:    [facility]  [address] [place], [place] Crisis [phone]   Family Service of the [employer] [phone]   [place]  [phone]    [place]  [phone] (after hours)   Therapeutic Alternative/Mobile Crisis   [phone]   [place] National Suicide Hotline  973-822-6115 (TALK) OR 988   Call 911 or go to emergency room   [facility]  [phone]);  [place] and [employer]  [phone]); [place], [place], [place], [place], [place], [place], [place]        10 LITTLE Things To Do When You're Feeling Too Down To Do Anything  Take a shower. Even if you plan to stay in all day long and not see a soul, take a shower. It takes the most effort to hop in to the shower but once you do, you'll feel immediate results. It will wake you up and you'll be feeling much fresher (and cleaner too).  Brush and floss your teeth. Give your teeth a good brushing with a floss finish. It's a small task but it feels so good and you can check 'taking care of your health' off the list of things to do.  Do something small on your list. Most of  [place] have some small thing we would like to get done (load of laundry, sew a button, email a friend). Doing one of these things will make you feel like you've accomplished something.  Drink water. Drinking water is easy right? It's also really beneficial for your health so keep a glass beside you all day and take sips often. It gives you energy and prevents you from boredom eating.  Do some floor  exercises. The last thing you want to do is exercise but it might be just the thing you need the most. Keep it simple and do exercises that involve sitting or laying on the floor. Even the smallest of exercises release chemicals in the brain that make you feel good. Yoga stretches or core exercises are going to make you feel good with minimal effort.  Make your bed. Making your bed takes a few minutes but it's productive and you'll feel relieved when it's done. An unmade bed is a huge visual reminder that you're having an unproductive day. Do it and consider it your housework for the day.  Put on some nice clothes. Take the sweatpants off even if you don't plan to go anywhere. Put on clothes that make you feel good. Take a look in the mirror so your brain recognizes the sweatpants have been replaced with clothes that make you look great. It's an instant confidence booster.  Wash the dishes. A pile of dirty dishes in the sink is a reflection of your mood. It's possible that if you wash up the dishes, your mood will follow suit. It's worth a try.  Cook a real meal. If you have the luxury to have a "do nothing" day, you have time to make a real meal for yourself. Make a meal that you love to eat. The process is good to get you out of the funk and the food will ensure you have more energy for tomorrow.  Write out your thoughts by hand. When you hand write, you stimulate your brain to focus on the moment that you're in so make yourself comfortable and write whatever comes into your mind. Put those thoughts out on paper so they stop spinning around in your head. Those thoughts might be the very thing holding you down.

## 2022-08-03 NOTE — Patient Outreach (Signed)
Medicaid Managed Care Social Work Note  08/03/2022 Name:  Judy Cross MRN:  [id number] DOB:  21-Jan-1966  Judy Cross is an 57 y.o. year old female who is a primary patient of Lindell Spar, MD.  The Medicaid Managed Care Coordination team was consulted for assistance with:  [facility] Resources  Judy Cross was given information about Medicaid Managed Care Coordination team services today. Judy Cross Patient agreed to services and verbal consent obtained.  Engaged with patient  for by telephone forinitial visit in response to referral for case management and/or care coordination services.   Assessments/Interventions:  Review of past medical history, allergies, medications, health status, including review of consultants reports, laboratory and other test data, was performed as part of comprehensive evaluation and provision of chronic care management services.  SDOH: (Social Determinant of Health) assessments and interventions performed: SDOH Interventions    Flowsheet Row Patient Outreach Telephone from 08/03/2022 in [place] Patient Outreach Telephone from 07/10/2022 in [place] Patient Outreach Telephone from 06/26/2022 in [place] Patient Outreach Telephone from 06/16/2022 in [place] Patient Outreach Telephone from 06/07/2022 in [place] Telephone from 05/30/2022 in [place]  SDOH Interventions        Transportation Interventions -- -- -- Intervention Not Indicated -- Intervention Not Indicated  Depression Interventions/Treatment  Counseling -- -- -- -- --  Financial Strain Interventions -- -- -- -- -- --  [Referred to BSW and Pharmacy]  Stress Interventions Offered [employer], Provide Counseling Offered [employer], Provide Counseling  Offered [employer] Resources, Provide Counseling -- [place], Provide Counseling --       Advanced Directives Status:  See Care Plan for related entries.  Care Plan                 Allergies  Allergen Reactions   Lisinopril Swelling    swelling to entire mouth.   Penicillins Rash    Has patient had a PCN reaction causing immediate rash, facial/tongue/throat swelling, SOB or lightheadedness with hypotension: no - next day Has patient had a PCN reaction causing severe rash involving mucus membranes or skin necrosis: No Has patient had a PCN reaction that required hospitalization No Has patient had a PCN reaction occurring within the last 10 years: Yes If all of the above answers are "NO", then may proceed with Cephalosporin use.    Tramadol Other (See Comments)    dizziness    Medications Reviewed Today     Reviewed by Santo Held (Technologist) on 07/31/22 at 1117  Med List Status: <None>   Medication Order Taking? Sig Documenting Provider Last Dose Status Informant  albuterol (VENTOLIN HFA) 108 (90 Base) MCG/ACT inhaler [id number] Yes Inhale 2 puffs into the lungs every 6 (six) hours as needed for wheezing or shortness of breath. Lindell Spar, MD Taking Active   hydrochlorothiazide (HYDRODIURIL) 25 MG tablet [id number] Yes Take 1 tablet (25 mg total) by mouth daily. Lindell Spar, MD Taking Active   HYDROcodone-acetaminophen (NORCO/VICODIN) 5-325 MG tablet [id number] Yes One tablet every six hours for pain.  Limit 5 days. [name], MD Taking Active   levETIRAcetam (KEPPRA) 750 MG tablet [id number] Yes Take 1 tablet (750 mg total) by mouth 2 (two) times daily. Alric Ran, MD Taking Active   levocetirizine (XYZAL) 5 MG tablet [id number] Yes Take 1 tablet (  5 mg total) by mouth every evening. Lindell Spar, MD Taking Active   omeprazole (PRILOSEC) 20 MG capsule [id number] Yes Take 1 capsule (20 mg total) by mouth daily. Lindell Spar, MD Taking Active   sertraline (ZOLOFT) 50 MG tablet [id number] Yes Take 1 tablet (50 mg total) by mouth daily. Lindell Spar, MD Taking Active   Vitamin D, Ergocalciferol, (DRISDOL) 1.25 MG (50000 UNIT) CAPS capsule [id number] Yes Take 1 capsule (50,000 Units total) by mouth every 7 (seven) days. Lindell Spar, MD Taking Active             Patient Active Problem List   Diagnosis Date Noted   Encounter for general adult medical examination with abnormal findings 07/27/2022   Hypokalemia 03/22/2022   Vitamin D deficiency 03/22/2022   Fall 03/22/2022   Current moderate episode of major depressive disorder without prior episode ([place]) 03/21/2022   Tobacco abuse 01/06/2022   Spinal stenosis of lumbar region 06/17/2021   Mass of lower outer quadrant of left breast 05/09/2021   Arthritis of left knee 12/02/2020   Chronic left shoulder pain 12/02/2020   Allergic sinusitis 12/02/2020   Mass of left finger    Anxiety 06/12/2019   Seizure disorder ([place]) 06/12/2019   Environmental and seasonal allergies 06/12/2019   Abnormal Pap smear of cervix 05/27/2019   Chronic midline low back pain with left-sided sciatica 01/12/2017   Essential hypertension 12/07/2016   Mild intermittent asthma without complication [id number]    Conditions to be addressed/monitored per PCP order:  Anxiety and Depression  Care Plan : LCSW Plan of Care  Updates made by Greg Cutter, LCSW since 08/03/2022 12:00 AM     Problem: Coping Skills for symptoms of Anxiety      Goal: Coping Skills Enhanced by connecting for ongoing therapy   Start Date: 07/20/2021  Recent Progress: On track  Priority: High  Note:   Current Barriers:  Disease Management support and education needs related to Anxiety with Panic Symptoms, and Stress at home  CSW Clinical Goal(s):  Patient  will work with [facility] LCSW to address needs related to managing symptoms of anxiety  through collaboration with Clinical Social Worker, provider,  and care team Patient will gain counseling and psychiatry services to help increase mental health support  Interventions: Inter-disciplinary care team collaboration (see longitudinal plan of care) Evaluation of current treatment plan related to self management and patient's adherence to plan as established by provider Review resources, discussed options and provided patient information about  Department of Social Services ( currently receiving services ) Transportation provided by insurance provider (currently using) Discussed therapy with options based on need and insurance  BSW completed a telephone outreach with patient, she states she is not working and does not have any income. Patient states she is in need of resources for everything. BSW will mail patient a list of resources.  Mental Health:  (Status: New goal.) Evaluation of current treatment plan related to Anxiety with Panic Symptoms, Solution-Focused Strategies employed:  Mindfulness or Relaxation training provided Active listening / Reflection utilized  Emotional Support Provided Quality of sleep assessed & Sleep Hygiene techniques promoted  Provided EMMI education information on relieving stress, and insomnia getting a good night's rest Suicidal Ideation/Homicidal Ideation assessed: denied Collaborated with [facility] in [place], [place] [phone]. Made referral to [facility]( patient has appointment 07/22/2021. Patient was unable to attend this appointment due to a family emergency. She has not rescheduled this  appointment yet but will do so today. 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 Patient reports that she has already completed her UDS, TB test and Criminal Background. Update New mental health referrals placed for psychiatry and counseling to [facility] [place].  06/16/22 Update- Patient reports that she has not received a call from [facility] at [place]. One of the two referrals that [facility] LCSW made is denied. Patient is agreeable to contact agency next week (they close early on Fridays) to make sure she gets scheduled for her initial sessions. Emotional support provided. Mental Health resource contact information provided as well. 06/26/22 update- Patient reports that she has not heard from [facility] yet but will contact them today as they are closed today for the holiday. [facility] provided brief coping skill education for stress management. [facility] LCSW will follow up within 2 weeks. Patient reports that she successfully retrieved email that [facility] LCSW sent during last session. 07/10/22 update- [place] LCSW was informed by patient that her boyfriend of 2 months passed away over the weekend which has increased her symptoms of grief and depression. Grief support provided to patient and she was receptive to this. [facility] LCSW made call to [facility] [place] and spoke to staff. They will contact patient directly today to schedule her counseling appointment but her psychiatry referral will have to be reviewed first before scheduling even though that referral was made over a month ago. [facility] LCSW will follow up next month. LCSW 08/03/22 update- Patient is not picking up her depression medication or any other medication due to financial issues. [facility] LCSW will update Pharmacist that is involved in her care. Emotional support and crisis resource education provided. Patient was encouraged to consider [facility] walk in clinic until her appointment next month at [facility] at [place], [place].  Patient reports that she is implementing appropriate boundaries to implement negative people, places and things. Patient works on her breathing exercises daily.  Patient denies any SI/HI. Patient reports that she continues to experience signs of grief but was  able to turn it around and even let a balloon go in remembrance of her father. Positive reinforcement provided.   LCSW provided education on relaxation techniques such as meditation, deep breathing, massage, grounding exercsies or yoga that can activate the body's relaxation response and ease symptoms of stress and anxiety. LCSW ask that when pt is struggling with difficult emotions and racing thoughts that they start this relaxation response process. LCSW provided extensive education on healthy coping skills for anxiety. SW used active and reflective listening, validated patient's feelings/concerns, and provided emotional support.  Patient Self-Care Activities: Practice relaxed breathing 3 times a day     24- Hour Availability:    [facility]  [address] [place], [place] Crisis [phone]   Family Service of the [employer] [place]  [phone]    [place]  [phone] (after hours)   Therapeutic Alternative/Mobile Crisis   [phone]   [place] National Suicide Hotline  770-266-9907 (TALK) OR 988   Call 911 or go to emergency room   [facility]  [phone]);  [place] and [employer]  [phone]); [place], [place], [place], [place], [place], [place], [place]  10 LITTLE Things To Do When You're Feeling Too Down To Do Anything  Take a shower. Even if you plan to stay in all day long and not see a soul, take a shower. It takes the most effort to hop in to the shower but once you do, you'll feel immediate results. It will wake you up and you'll be feeling much fresher (and cleaner too).  Brush and floss your teeth. Give your teeth a good brushing with a floss finish. It's a small task but it feels so good and you can check 'taking care of your health' off the list of things to do.  Do something small on your list. Most  of [place] have some small thing we would like to get done (load of laundry, sew a button, email a friend). Doing one of these things will make you feel like you've accomplished something.  Drink water. Drinking water is easy right? It's also really beneficial for your health so keep a glass beside you all day and take sips often. It gives you energy and prevents you from boredom eating.  Do some floor exercises. The last thing you want to do is exercise but it might be just the thing you need the most. Keep it simple and do exercises that involve sitting or laying on the floor. Even the smallest of exercises release chemicals in the brain that make you feel good. Yoga stretches or core exercises are going to make you feel good with minimal effort.  Make your bed. Making your bed takes a few minutes but it's productive and you'll feel relieved when it's done. An unmade bed is a huge visual reminder that you're having an unproductive day. Do it and consider it your housework for the day.  Put on some nice clothes. Take the sweatpants off even if you don't plan to go anywhere. Put on clothes that make you feel good. Take a look in the mirror so your brain recognizes the sweatpants have been replaced with clothes that make you look great. It's an instant confidence booster.  Wash the dishes. A pile of dirty dishes in the sink is a reflection of your mood. It's possible that if you wash up the dishes, your mood will follow suit. It's worth a try.  Cook a real meal. If you have the luxury to have a "do nothing" day, you have time to make a real meal for yourself. Make a meal that you love to eat. The process is good to get you out of the funk and the food will ensure you have more energy for tomorrow.  Write out your thoughts by hand. When you hand write, you stimulate your brain to focus on the moment that you're in so make yourself comfortable and write whatever comes into your mind. Put those thoughts  out on paper so they stop spinning around in your head. Those thoughts might be the very thing holding you down.      Follow up:  Patient agrees to Care Plan and Follow-up.  Plan: The Managed Medicaid care management team will reach out to the patient again over the next 30 days.  Date/time of next scheduled Social Work care management/care coordination outreach:  08/23/22 at 63 am.  Eula Fried, BSW, MSW, [place] Medicaid LCSW [email or web address] Phone: [phone]

## 2022-08-04 ENCOUNTER — Other Ambulatory Visit: Payer: Medicaid Other | Admitting: *Deleted

## 2022-08-04 NOTE — Patient Instructions (Signed)
Visit Information  Ms. [place]  - as a part of your Medicaid benefit, you are eligible for care management and care coordination services at no cost or copay. I was unable to reach you by phone today but would be happy to help you with your health related needs. Please feel free to call me @ [phone].   A member of the Managed Medicaid care management team will reach out to you again over the next 7 days.   Lurena Joiner RN, BSN [place]  [employer] manager

## 2022-08-04 NOTE — Patient Outreach (Signed)
  Medicaid Managed Care   Unsuccessful Attempt Note   08/04/2022 Name: Judy Cross MRN: [id number] DOB: 1965-10-14  Referred by: Lindell Spar, MD Reason for referral : High Risk Managed Medicaid (Unsuccessful RNCM follow up telephone outreach)   An unsuccessful telephone outreach was attempted today. The patient was referred to the case management team for assistance with care management and care coordination.    Follow Up Plan: A HIPAA compliant phone message was left for the patient providing contact information and requesting a return call. and The Managed Medicaid care management team will reach out to the patient again over the next 7 days.    Lurena Joiner RN, BSN [place]  [employer] manager

## 2022-08-07 ENCOUNTER — Telehealth (HOSPITAL_COMMUNITY): Payer: Self-pay | Admitting: Physical Therapy

## 2022-08-07 ENCOUNTER — Encounter (HOSPITAL_COMMUNITY): Payer: Medicaid Other | Admitting: Physical Therapy

## 2022-08-07 NOTE — Telephone Encounter (Signed)
Pt did not show for appt.  Today is second NS with no further appts scheduled.  Pt was evaluated 1 month ago and has only made 1 appointment since that evaluation.  Pt will now be discharged due to non compliance.   Teena Irani, PTA/CLT [place] Ph: [phone]

## 2022-08-08 ENCOUNTER — Telehealth: Payer: Self-pay | Admitting: Orthopaedic Surgery

## 2022-08-08 ENCOUNTER — Encounter (HOSPITAL_COMMUNITY): Payer: Medicaid Other | Admitting: Physical Therapy

## 2022-08-08 MED ORDER — HYDROCODONE-ACETAMINOPHEN 5-325 MG PO TABS: ORAL_TABLET | ORAL | 0 refills | Status: DC

## 2022-08-08 NOTE — Telephone Encounter (Signed)
Patient lvm stating her medication is due today.  She is requesting a refill on Hydrocodone 5-325 to be sent to [facility] on [address].

## 2022-08-10 ENCOUNTER — Encounter: Payer: Self-pay | Admitting: *Deleted

## 2022-08-10 ENCOUNTER — Other Ambulatory Visit: Payer: Commercial Managed Care - HMO | Admitting: *Deleted

## 2022-08-10 NOTE — Patient Outreach (Signed)
Medicaid Managed Care   Nurse Care Manager Note  08/10/2022 Name:  Judy Cross MRN:  [id number] DOB:  Oct 10, 1965  Judy Cross is an 56 y.o. year old female who is a primary patient of Lindell Spar, MD.  The [facility] Managed Care Coordination team was consulted for assistance with:    HTN Seizure Disorder  Judy Cross was given information about Medicaid Managed Care Coordination team services today. Judy Cross Patient agreed to services and verbal consent obtained.  Engaged with patient by telephone for initial visit in response to provider referral for case management and/or care coordination services.   Assessments/Interventions:  Review of past medical history, allergies, medications, health status, including review of consultants reports, laboratory and other test data, was performed as part of comprehensive evaluation and provision of chronic care management services.  SDOH (Social Determinants of Health) assessments and interventions performed: SDOH Interventions    Flowsheet Row Patient Outreach Telephone from 08/10/2022 in [place] Patient Outreach Telephone from 08/03/2022 in [place] Patient Outreach Telephone from 07/10/2022 in [place] Patient Outreach Telephone from 06/26/2022 in [place] Patient Outreach Telephone from 06/16/2022 in [place] Patient Outreach Telephone from 06/07/2022 in [place]  SDOH Interventions        Transportation Interventions Intervention Not Indicated -- -- -- Intervention Not Indicated --  Depression Interventions/Treatment  -- Counseling -- -- -- --  Stress Interventions -- [employer], Provide Counseling Offered [employer], Provide Counseling Offered Judy Cross, Provide Counseling -- Offered  [facility], Provide Counseling       Care Plan  Allergies  Allergen Reactions   Lisinopril Swelling    swelling to entire mouth.   Penicillins Rash    Has patient had a PCN reaction causing immediate rash, facial/tongue/throat swelling, SOB or lightheadedness with hypotension: no - next day Has patient had a PCN reaction causing severe rash involving mucus membranes or skin necrosis: No Has patient had a PCN reaction that required hospitalization No Has patient had a PCN reaction occurring within the last 10 years: Yes If all of the above answers are "NO", then may proceed with Cephalosporin use.    Tramadol Other (See Comments)    dizziness    Medications Reviewed Today     Reviewed by Melissa Montane, RN (Registered Nurse) on 08/10/22 at [place] List Status: <None>   Medication Order Taking? Sig Documenting Provider Last Dose Status Informant  albuterol (VENTOLIN HFA) 108 (90 Base) MCG/ACT inhaler [id number] Yes Inhale 2 puffs into the lungs every 6 (six) hours as needed for wheezing or shortness of breath. Lindell Spar, MD Taking Active   hydrochlorothiazide (HYDRODIURIL) 25 MG tablet [id number] No Take 1 tablet (25 mg total) by mouth daily.  Patient not taking: Reported on 08/10/2022   Lindell Spar, MD Not Taking Active   HYDROcodone-acetaminophen (NORCO/VICODIN) 5-325 MG tablet [id number] Yes One tablet every six hours for pain.  Limit 5 days. [name], MD Taking Active   levETIRAcetam (KEPPRA) 750 MG tablet [id number] Yes Take 1 tablet (750 mg total) by mouth 2 (two) times daily. Alric Ran, MD Taking Active            Med Note (Katoya Amato A   Thu Aug 10, 2022  9:11 AM) Taking once daily  levocetirizine (XYZAL) 5 MG tablet [id number] No Take  1 tablet (5 mg total) by mouth every evening.  Patient not taking: Reported on 08/10/2022   Lindell Spar, MD Not Taking Active   omeprazole (PRILOSEC) 20 MG capsule [id number] No Take 1 capsule (20 mg  total) by mouth daily.  Patient not taking: Reported on 08/10/2022   Lindell Spar, MD Not Taking Active   sertraline (ZOLOFT) 50 MG tablet [id number] No Take 1 tablet (50 mg total) by mouth daily.  Patient not taking: Reported on 08/10/2022   Lindell Spar, MD Not Taking Active   Vitamin D, Ergocalciferol, (DRISDOL) 1.25 MG (50000 UNIT) CAPS capsule [id number] Yes Take 1 capsule (50,000 Units total) by mouth every 7 (seven) days. Lindell Spar, MD Taking Active             Patient Active Problem List   Diagnosis Date Noted   Encounter for general adult medical examination with abnormal findings 07/27/2022   Hypokalemia 03/22/2022   Vitamin D deficiency 03/22/2022   Fall 03/22/2022   Current moderate episode of major depressive disorder without prior episode ([place]) 03/21/2022   Tobacco abuse 01/06/2022   Spinal stenosis of lumbar region 06/17/2021   Mass of lower outer quadrant of left breast 05/09/2021   Arthritis of left knee 12/02/2020   Chronic left shoulder pain 12/02/2020   Allergic sinusitis 12/02/2020   Mass of left finger    Anxiety 06/12/2019   Seizure disorder ([place]) 06/12/2019   Environmental and seasonal allergies 06/12/2019   Abnormal Pap smear of cervix 05/27/2019   Chronic midline low back pain with left-sided sciatica 01/12/2017   Essential hypertension 12/07/2016   Mild intermittent asthma without complication [id number]    Conditions to be addressed/monitored per PCP order:  HTN and Seizure Disorder  Care Plan : RN Care Manager Plan of Care  Updates made by Melissa Montane, RN since 08/10/2022 12:00 AM     Problem: Health Management needs related to HTN and Seizure disorder      Long-Range Goal: Development of Plan of Care to address Health Management needs related to HTN and Seizure disorder   Start Date: 08/10/2022  Expected End Date: 12/08/2022  Note:   Current Barriers:  Chronic Disease Management support and education needs related to HTN and  Seizure Disorder Financial Constraints. Patient now has [place] and Managed Medicaid and has applied for SSI  RNCM Clinical Goal(s):  Patient will verbalize understanding of plan for management of HTN and Seizure Disorder as evidenced by patient reports take all medications exactly as prescribed and will call provider for medication related questions as evidenced by patient reports and EMR documentation    attend all scheduled medical appointments: 08/23/22 with LCSW, 08/24/22 for MRI, 08/28/22 with Orthopedic and 09/08/22 with PCP as evidenced by provider documentation in EMR        through collaboration with RN Care manager, provider, and care team.   Interventions: Inter-disciplinary care team collaboration (see longitudinal plan of care) Evaluation of current treatment plan related to  self management and patient's adherence to plan as established by provider Verified patient now has [employer] and Managed Medicaid [facility]   Seizure Disorder  (Status: New goal.) Long Term Goal  Evaluation of current treatment plan related to  seizure disorder ,  self-management and patient's adherence to plan as established by provider. Discussed plans with patient for ongoing care management follow up and provided patient with direct contact information for care management team Advised patient to pick up [place] today and take as  directed, twice daily; Reviewed medications with patient and discussed the importance of taking prescribed medications as directed, advised she pick up HCTZ, Keppra and zoloft this week and the rest of her medications when she has more money; Reviewed scheduled/upcoming provider appointments including 08/23/22 with LCSW, 08/24/22 for MRI, 08/28/22 with Orthopedic and 09/08/22 with PCP; Assessed social determinant of health barriers;  Provided education on managing seizures  Hypertension: (Status: New goal.) Long Term Goal  Last practice recorded BP readings:  BP Readings from Last 3 Encounters:   07/27/22 (!) 148/84  07/04/22 (!) 159/102  05/27/22 (!) 150/110  Most recent eGFR/CrCl:  Lab Results  Component Value Date   EGFR 89 03/21/2022    No components found for: "CRCL"  Evaluation of current treatment plan related to hypertension self management and patient's adherence to plan as established by provider;   Provided education to patient re: stroke prevention, s/s of heart attack and stroke; Reviewed prescribed diet heart healthy Reviewed medications with patient and discussed importance of compliance;  Discussed plans with patient for ongoing care management follow up and provided patient with direct contact information for care management team; Reviewed scheduled/upcoming provider appointments including:  Assessed social determinant of health barriers;  Reviewed medications and discussed the importance of compliance, advised she pick up HCTZ, Keppra and Zoloft today and the rest of her medication when she has more money  Patient Goals/Self-Care Activities: Take medications as prescribed   Attend all scheduled provider appointments Call provider office for new concerns or questions  keep all doctor appointments take medications for blood pressure exactly as prescribed eat more whole grains, fruits and vegetables, lean meats and healthy fats Decrease prepackaged foods which are high in sodium       Follow Up:  Patient agrees to Care Plan and Follow-up.  Plan: The Managed Medicaid care management team will reach out to the patient again over the next 14 days.  Date/time of next scheduled RN care management/care coordination outreach:  08/21/22 @ [place] RN, [place] RN Care Coordinator

## 2022-08-10 NOTE — Patient Instructions (Signed)
Visit Information  Judy Cross was given information about Medicaid Managed Care team care coordination services as a part of their [place] Medicaid benefit. Judy Cross verbally consented to engagement with the [facility] Managed Care team.   If you are experiencing a medical emergency, please call 911 or report to your local emergency department or urgent care.   If you have a non-emergency medical problem during routine business hours, please contact your provider's office and ask to speak with a nurse.   For questions related to your [facility], please call: [phone] or visit the homepage here: [email or web address]  If you would like to schedule transportation through your [facility], please call the following number at least 2 days in advance of your appointment: [phone]   Rides for urgent appointments can also be made after hours by calling Member Services.  Call the [place] at [phone], at any time, 24 hours a day, 7 days a week. If you are in danger or need immediate medical attention call 911.  If you would like help to quit smoking, call 1-800-QUIT-NOW 219-021-6066) OR Espaol: 1-855-Djelo-Ya HD:1601594) o para ms informacin haga clic aqu or Text READY to 200-400 to register via text  Judy Cross,   Please see education materials related to seizures and HTN provided as print materials.   The patient verbalized understanding of instructions, educational materials, and care plan provided today and agreed to receive a mailed copy of patient instructions, educational materials, and care plan.   Telephone follow up appointment with Managed Medicaid care management team member scheduled for:08/21/22 @ [place] RN, BSN [place] RN Care Coordinator   Following is a  copy of your plan of care:  Care Plan : RN Care Manager Plan of Care  Updates made by Melissa Montane, RN since 08/10/2022 12:00 AM     Problem: Health Management needs related to HTN and Seizure disorder      Long-Range Goal: Development of Plan of Care to address Health Management needs related to HTN and Seizure disorder   Start Date: 08/10/2022  Expected End Date: 12/08/2022  Note:   Current Barriers:  Chronic Disease Management support and education needs related to HTN and Seizure Disorder Financial Constraints. Patient now has [place] and Managed Medicaid and has applied for SSI  RNCM Clinical Goal(s):  Patient will verbalize understanding of plan for management of HTN and Seizure Disorder as evidenced by patient reports take all medications exactly as prescribed and will call provider for medication related questions as evidenced by patient reports and EMR documentation    attend all scheduled medical appointments: 08/23/22 with LCSW, 08/24/22 for MRI, 08/28/22 with Orthopedic and 09/08/22 with PCP as evidenced by provider documentation in EMR        through collaboration with RN Care manager, provider, and care team.   Interventions: Inter-disciplinary care team collaboration (see longitudinal plan of care) Evaluation of current treatment plan related to  self management and patient's adherence to plan as established by provider Verified patient now has [employer] and Managed Medicaid [facility]   Seizure Disorder  (Status: New goal.) Long Term Goal  Evaluation of current treatment plan related to  seizure disorder ,  self-management and patient's adherence to plan as established by provider. Discussed plans with patient for ongoing care management follow up and provided patient with direct contact information for care management team Advised patient  to pick up Keppra today and take as directed, twice daily; Reviewed medications with patient and discussed the importance of taking prescribed  medications as directed, advised she pick up HCTZ, Keppra and zoloft this week and the rest of her medications when she has more money; Reviewed scheduled/upcoming provider appointments including 08/23/22 with LCSW, 08/24/22 for MRI, 08/28/22 with Orthopedic and 09/08/22 with PCP; Assessed social determinant of health barriers;  Provided education on managing seizures  Hypertension: (Status: New goal.) Long Term Goal  Last practice recorded BP readings:  BP Readings from Last 3 Encounters:  07/27/22 (!) 148/84  07/04/22 (!) 159/102  05/27/22 (!) 150/110  Most recent eGFR/CrCl:  Lab Results  Component Value Date   EGFR 89 03/21/2022    No components found for: "CRCL"  Evaluation of current treatment plan related to hypertension self management and patient's adherence to plan as established by provider;   Provided education to patient re: stroke prevention, s/s of heart attack and stroke; Reviewed prescribed diet heart healthy Reviewed medications with patient and discussed importance of compliance;  Discussed plans with patient for ongoing care management follow up and provided patient with direct contact information for care management team; Reviewed scheduled/upcoming provider appointments including:  Assessed social determinant of health barriers;  Reviewed medications and discussed the importance of compliance, advised she pick up HCTZ, Keppra and Zoloft today and the rest of her medication when she has more money  Patient Goals/Self-Care Activities: Take medications as prescribed   Attend all scheduled provider appointments Call provider office for new concerns or questions  keep all doctor appointments take medications for blood pressure exactly as prescribed eat more whole grains, fruits and vegetables, lean meats and healthy fats Decrease prepackaged foods which are high in sodium

## 2022-08-15 ENCOUNTER — Telehealth: Payer: Self-pay | Admitting: Orthopaedic Surgery

## 2022-08-15 MED ORDER — HYDROCODONE-ACETAMINOPHEN 5-325 MG PO TABS: ORAL_TABLET | ORAL | 0 refills | Status: DC

## 2022-08-15 NOTE — Telephone Encounter (Signed)
Patient lvm stating her medication is due today.  She is requesting a refill on Oxycodone 5-325 to be sent to [facility] on [address]

## 2022-08-21 ENCOUNTER — Other Ambulatory Visit: Payer: Commercial Managed Care - HMO | Admitting: *Deleted

## 2022-08-21 ENCOUNTER — Encounter: Payer: Self-pay | Admitting: *Deleted

## 2022-08-21 NOTE — Patient Outreach (Signed)
Medicaid Managed Care   Nurse Care Manager Note  08/21/2022 Name:  Judy Cross MRN:  [id number] DOB:  1965/08/09  Judy Cross is an 57 y.o. year old female who is a primary patient of Judy Spar, MD.  The [facility] Managed Care Coordination team was consulted for assistance with:    HTN seizures  Ms. Langlie was given information about Medicaid Managed Care Coordination team services today. Judy Cross Patient agreed to services and verbal consent obtained.  Engaged with patient by telephone for follow up visit in response to provider referral for case management and/or care coordination services.   Assessments/Interventions:  Review of past medical history, allergies, medications, health status, including review of consultants reports, laboratory and other test data, was performed as part of comprehensive evaluation and provision of chronic care management services.  SDOH (Social Determinants of Health) assessments and interventions performed: SDOH Interventions    Flowsheet Row Patient Outreach Telephone from 08/21/2022 in [place] Patient Outreach Telephone from 08/10/2022 in [place] Patient Outreach Telephone from 08/03/2022 in [place] Patient Outreach Telephone from 07/10/2022 in [place] Patient Outreach Telephone from 06/26/2022 in [place] Patient Outreach Telephone from 06/16/2022 in [place] Interventions        Food Insecurity Interventions Intervention Not Indicated -- -- -- -- --  Housing Interventions --  [Currently staying with her son. Waiting for disability determination] -- -- -- -- --  Transportation Interventions -- Intervention Not Indicated -- -- -- Intervention Not Indicated  Depression Interventions/Treatment  -- -- Counseling -- -- --  Stress Interventions -- --  [employer], Provide Counseling Offered [employer], Provide Counseling Offered [employer], Provide Counseling --       Care Plan  Allergies  Allergen Reactions   Lisinopril Swelling    swelling to entire mouth.   Penicillins Rash    Has patient had a PCN reaction causing immediate rash, facial/tongue/throat swelling, SOB or lightheadedness with hypotension: no - next day Has patient had a PCN reaction causing severe rash involving mucus membranes or skin necrosis: No Has patient had a PCN reaction that required hospitalization No Has patient had a PCN reaction occurring within the last 10 years: Yes If all of the above answers are "NO", then may proceed with Cephalosporin use.    Tramadol Other (See Comments)    dizziness    Medications Reviewed Today     Reviewed by Melissa Montane, RN (Registered Nurse) on 08/21/22 at [place] List Status: <None>   Medication Order Taking? Sig Documenting Provider Last Dose Status Informant  albuterol (VENTOLIN HFA) 108 (90 Base) MCG/ACT inhaler [id number] No Inhale 2 puffs into the lungs every 6 (six) hours as needed for wheezing or shortness of breath.  Patient not taking: Reported on 08/21/2022   Judy Spar, MD Not Taking Active   hydrochlorothiazide (HYDRODIURIL) 25 MG tablet [id number] Yes Take 1 tablet (25 mg total) by mouth daily. Judy Spar, MD Taking Active   HYDROcodone-acetaminophen (NORCO/VICODIN) 5-325 MG tablet [id number] Yes One tablet every six hours for pain.  Limit 5 days. [name], MD Taking Active   levETIRAcetam (KEPPRA) 750 MG tablet [id number] Yes Take 1 tablet (750 mg total) by mouth 2 (two) times daily. Alric Ran, MD Taking Active  Med Note (Zahid Carneiro A   Mon Aug 21, 2022  9:09 AM)    levocetirizine (XYZAL) 5 MG tablet [id number] No Take 1 tablet (5 mg total) by mouth every evening.  Patient not taking: Reported on 08/10/2022    Judy Spar, MD Not Taking Active   omeprazole (PRILOSEC) 20 MG capsule [id number] No Take 1 capsule (20 mg total) by mouth daily.  Patient not taking: Reported on 08/10/2022   Judy Spar, MD Not Taking Active   sertraline (ZOLOFT) 50 MG tablet [id number] Yes Take 1 tablet (50 mg total) by mouth daily. Judy Spar, MD Taking Active   Vitamin D, Ergocalciferol, (DRISDOL) 1.25 MG (50000 UNIT) CAPS capsule [id number] No Take 1 capsule (50,000 Units total) by mouth every 7 (seven) days.  Patient not taking: Reported on 08/21/2022   Judy Spar, MD Not Taking Active             Patient Active Problem List   Diagnosis Date Noted   Encounter for general adult medical examination with abnormal findings 07/27/2022   Hypokalemia 03/22/2022   Vitamin D deficiency 03/22/2022   Fall 03/22/2022   Current moderate episode of major depressive disorder without prior episode ([place]) 03/21/2022   Tobacco abuse 01/06/2022   Spinal stenosis of lumbar region 06/17/2021   Mass of lower outer quadrant of left breast 05/09/2021   Arthritis of left knee 12/02/2020   Chronic left shoulder pain 12/02/2020   Allergic sinusitis 12/02/2020   Mass of left finger    Anxiety 06/12/2019   Seizure disorder ([place]) 06/12/2019   Environmental and seasonal allergies 06/12/2019   Abnormal Pap smear of cervix 05/27/2019   Chronic midline low back pain with left-sided sciatica 01/12/2017   Essential hypertension 12/07/2016   Mild intermittent asthma without complication [id number]    Conditions to be addressed/monitored per PCP order:  HTN and Seizures  Care Plan : RN Care Manager Plan of Care  Updates made by Melissa Montane, RN since 08/21/2022 12:00 AM     Problem: Health Management needs related to HTN and Seizure disorder      Long-Range Goal: Development of Plan of Care to address Health Management needs related to HTN and Seizure disorder   Start Date: 08/10/2022  Expected End Date: 12/08/2022   Note:   Current Barriers:  Chronic Disease Management support and education needs related to HTN and Seizure Disorder Financial Constraints. Patient now has [place] and Managed Medicaid and has applied for SSI  RNCM Clinical Goal(s):  Patient will verbalize understanding of plan for management of HTN and Seizure Disorder as evidenced by patient reports take all medications exactly as prescribed and will call provider for medication related questions as evidenced by patient reports and EMR documentation    attend all scheduled medical appointments: 08/23/22 with LCSW, 08/24/22 for MRI, 08/28/22 with Orthopedic and 09/08/22 with PCP as evidenced by provider documentation in EMR        through collaboration with RN Care manager, provider, and care team.   Interventions: Inter-disciplinary care team collaboration (see longitudinal plan of care) Evaluation of current treatment plan related to  self management and patient's adherence to plan as established by provider Verified that patient has transportation to upcoming appointments Advised patient contact [facility] member services [phone] for enhanced benefits   Seizure Disorder  (Status: Goal on Track (progressing): YES.) Long Term Goal  Evaluation of current treatment plan related to  seizure disorder ,  self-management and patient's adherence to  plan as established by provider. Discussed plans with patient for ongoing care management follow up and provided patient with direct contact information for care management team Reviewed medications with patient and discussed the importance of taking prescribed medications as directed; Reviewed scheduled/upcoming provider appointments including 08/23/22 with LCSW, 08/24/22 for MRI, 08/28/22 with Orthopedic and 09/08/22 with PCP; Assessed social determinant of health barriers;  Verified patient has Keppra and is taking twice daily  Hypertension: (Status: New goal.) Long Term Goal  Last practice recorded BP  readings:  BP Readings from Last 3 Encounters:  07/27/22 (!) 148/84  07/04/22 (!) 159/102  05/27/22 (!) 150/110  Most recent eGFR/CrCl:  Lab Results  Component Value Date   EGFR 89 03/21/2022    No components found for: "CRCL"  Evaluation of current treatment plan related to hypertension self management and patient's adherence to plan as established by provider;   Provided education to patient re: stroke prevention, s/s of heart attack and stroke; Reviewed prescribed diet heart healthy Reviewed medications with patient and discussed importance of compliance;  Discussed plans with patient for ongoing care management follow up and provided patient with direct contact information for care management team; Reviewed scheduled/upcoming provider appointments including:  Assessed social determinant of health barriers;  Advised patient to discuss needing a BP cuff with PCP during upcoming appointment Verified patient picked up HCTZ and taking as directed  Patient Goals/Self-Care Activities: Take medications as prescribed   Attend all scheduled provider appointments Call provider office for new concerns or questions  keep all doctor appointments take medications for blood pressure exactly as prescribed eat more whole grains, fruits and vegetables, lean meats and healthy fats Decrease prepackaged foods which are high in sodium       Follow Up:  Patient agrees to Care Plan and Follow-up.  Plan: The Managed Medicaid care management team will reach out to the patient again over the next 30 days.  Date/time of next scheduled RN care management/care coordination outreach:  09/22/22 @ [place] RN, [place] RN Care Coordinator

## 2022-08-21 NOTE — Patient Instructions (Signed)
Visit Information  Ms. Pavel was given information about Medicaid Managed Care team care coordination services as a part of their [place] Medicaid benefit. Cherlynn NIERRA SCHOLLE verbally consented to engagement with the [facility] Managed Care team.   If you are experiencing a medical emergency, please call 911 or report to your local emergency department or urgent care.   If you have a non-emergency medical problem during routine business hours, please contact your provider's office and ask to speak with a nurse.   For questions related to your [facility], please call: [phone] or visit the homepage here: [email or web address]  If you would like to schedule transportation through your [facility], please call the following number at least 2 days in advance of your appointment: [phone]   Rides for urgent appointments can also be made after hours by calling Member Services.  Call the [place] at [phone], at any time, 24 hours a day, 7 days a week. If you are in danger or need immediate medical attention call 911.  If you would like help to quit smoking, call 1-800-QUIT-NOW (269)308-7676) OR Espaol: 1-855-Djelo-Ya HD:1601594) o para ms informacin haga clic aqu or Text READY to 200-400 to register via text  Ms. Cardarelli - following are the goals we discussed in your visit today:   Goals Addressed   None     Telephone follow up appointment with Managed Medicaid care management team member scheduled for:09/22/22 @ [place], [place] RN Care Coordinator   Following is a copy of your plan of care:  Care Plan : RN Care Manager Plan of Care  Updates made by Melissa Montane, RN since 08/21/2022 12:00 AM     Problem: Health Management needs related to HTN and Seizure disorder       Long-Range Goal: Development of Plan of Care to address Health Management needs related to HTN and Seizure disorder   Start Date: 08/10/2022  Expected End Date: 12/08/2022  Note:   Current Barriers:  Chronic Disease Management support and education needs related to HTN and Seizure Disorder Financial Constraints. Patient now has [place] and Managed Medicaid and has applied for SSI  RNCM Clinical Goal(s):  Patient will verbalize understanding of plan for management of HTN and Seizure Disorder as evidenced by patient reports take all medications exactly as prescribed and will call provider for medication related questions as evidenced by patient reports and EMR documentation    attend all scheduled medical appointments: 08/23/22 with LCSW, 08/24/22 for MRI, 08/28/22 with Orthopedic and 09/08/22 with PCP as evidenced by provider documentation in EMR        through collaboration with RN Care manager, provider, and care team.   Interventions: Inter-disciplinary care team collaboration (see longitudinal plan of care) Evaluation of current treatment plan related to  self management and patient's adherence to plan as established by provider Verified that patient has transportation to upcoming appointments Advised patient contact [facility] member services [phone] for enhanced benefits   Seizure Disorder  (Status: Goal on Track (progressing): YES.) Long Term Goal  Evaluation of current treatment plan related to  seizure disorder ,  self-management and patient's adherence to plan as established by provider. Discussed plans with patient for ongoing care management follow up and provided patient with direct contact information for care management team Reviewed medications with patient and discussed the importance of taking prescribed medications as directed; Reviewed  scheduled/upcoming provider appointments including 08/23/22 with LCSW, 08/24/22 for MRI, 08/28/22 with Orthopedic and 09/08/22 with PCP; Assessed  social determinant of health barriers;  Verified patient has Keppra and is taking twice daily  Hypertension: (Status: New goal.) Long Term Goal  Last practice recorded BP readings:  BP Readings from Last 3 Encounters:  07/27/22 (!) 148/84  07/04/22 (!) 159/102  05/27/22 (!) 150/110  Most recent eGFR/CrCl:  Lab Results  Component Value Date   EGFR 89 03/21/2022    No components found for: "CRCL"  Evaluation of current treatment plan related to hypertension self management and patient's adherence to plan as established by provider;   Provided education to patient re: stroke prevention, s/s of heart attack and stroke; Reviewed prescribed diet heart healthy Reviewed medications with patient and discussed importance of compliance;  Discussed plans with patient for ongoing care management follow up and provided patient with direct contact information for care management team; Reviewed scheduled/upcoming provider appointments including:  Assessed social determinant of health barriers;  Advised patient to discuss needing a BP cuff with PCP during upcoming appointment Verified patient picked up HCTZ and taking as directed  Patient Goals/Self-Care Activities: Take medications as prescribed   Attend all scheduled provider appointments Call provider office for new concerns or questions  keep all doctor appointments take medications for blood pressure exactly as prescribed eat more whole grains, fruits and vegetables, lean meats and healthy fats Decrease prepackaged foods which are high in sodium

## 2022-08-22 ENCOUNTER — Telehealth: Payer: Self-pay | Admitting: Orthopaedic Surgery

## 2022-08-22 MED ORDER — HYDROCODONE-ACETAMINOPHEN 5-325 MG PO TABS: ORAL_TABLET | ORAL | 0 refills | Status: DC

## 2022-08-22 NOTE — Telephone Encounter (Signed)
Patient lvm stating her Hydrocodone 5-325 is due today ad would like it to be sent to [facility] on [address].

## 2022-08-23 ENCOUNTER — Other Ambulatory Visit: Payer: Commercial Managed Care - HMO | Admitting: Licensed Clinical Social Worker

## 2022-08-23 NOTE — Patient Instructions (Signed)
Visit Information  Judy Cross was given information about Medicaid Managed Care team care coordination services as a part of their [place] Medicaid benefit. Judy Cross verbally consented to engagement with the [facility] Managed Care team.   If you are experiencing a medical emergency, please call 911 or report to your local emergency department or urgent care.   If you have a non-emergency medical problem during routine business hours, please contact your provider's office and ask to speak with a nurse.   For questions related to your [facility], please call: [phone] or visit the homepage here: [email or web address]  If you would like to schedule transportation through your [facility], please call the following number at least 2 days in advance of your appointment: [phone]   Rides for urgent appointments can also be made after hours by calling Member Services.  Call the [place] at [phone], at any time, 24 hours a day, 7 days a week. If you are in danger or need immediate medical attention call 911.  If you would like help to quit smoking, call 1-800-QUIT-NOW 3325025907) OR Espaol: 1-855-Djelo-Ya QO:409462) o para ms informacin haga clic aqu or Text READY to 200-400 to register via text.  Eula Fried, BSW, MSW, [employer] Managed Medicaid LCSW [email or web address] Phone: [phone]

## 2022-08-23 NOTE — Patient Outreach (Addendum)
Medicaid Managed Care Social Work Note  08/23/2022 Name:  Judy Cross MRN:  [id number] DOB:  01/23/1966  Judy Cross is an 57 y.o. year old female who is a primary patient of Lindell Spar, MD.  The Medicaid Managed Care Coordination team was consulted for assistance with:  [place] and Resources  [name] was given information about Medicaid Managed Care Coordination team services today. [name] Patient agreed to services and verbal consent obtained.  Engaged with patient  for by telephone forfollow up visit in response to referral for case management and/or care coordination services.   Assessments/Interventions:  Review of past medical history, allergies, medications, health status, including review of consultants reports, laboratory and other test data, was performed as part of comprehensive evaluation and provision of chronic care management services.  SDOH: (Social Determinant of Health) assessments and interventions performed: SDOH Interventions    Flowsheet Row Patient Outreach Telephone from 08/23/2022 in [place] Patient Outreach Telephone from 08/21/2022 in [place] Patient Outreach Telephone from 08/10/2022 in [place] Patient Outreach Telephone from 08/03/2022 in [place] Patient Outreach Telephone from 07/10/2022 in [place] Patient Outreach Telephone from 06/26/2022 in [place] Interventions        Food Insecurity Interventions -- Intervention Not Indicated -- -- -- --  Housing Interventions -- --  [Currently staying with her son. Waiting for disability determination] -- -- -- --  Transportation Interventions -- -- Intervention Not Indicated -- -- --  Depression Interventions/Treatment  -- -- -- Counseling -- --  Stress Interventions Offered [employer], Provide Counseling -- -- Offered [employer], Provide Counseling Offered [employer], Provide Counseling Offered [employer], Provide Counseling       Advanced Directives Status:  See Care Plan for related entries.  Care Plan                 Allergies  Allergen Reactions   Lisinopril Swelling    swelling to entire mouth.   Penicillins Rash    Has patient had a PCN reaction causing immediate rash, facial/tongue/throat swelling, SOB or lightheadedness with hypotension: no - next day Has patient had a PCN reaction causing severe rash involving mucus membranes or skin necrosis: No Has patient had a PCN reaction that required hospitalization No Has patient had a PCN reaction occurring within the last 10 years: Yes If all of the above answers are "NO", then may proceed with Cephalosporin use.    Tramadol Other (See Comments)    dizziness    Medications Reviewed Today     Reviewed by Melissa Montane, RN (Registered Nurse) on 08/21/22 at [place] List Status: <None>   Medication Order Taking? Sig Documenting Provider Last Dose Status Informant  albuterol (VENTOLIN HFA) 108 (90 Base) MCG/ACT inhaler [id number] No Inhale 2 puffs into the lungs every 6 (six) hours as needed for wheezing or shortness of breath.  Patient not taking: Reported on 08/21/2022   Lindell Spar, MD Not Taking Active   hydrochlorothiazide (HYDRODIURIL) 25 MG tablet [id number] Yes Take 1 tablet (25 mg total) by mouth daily. Lindell Spar, MD Taking Active   HYDROcodone-acetaminophen (NORCO/VICODIN) 5-325 MG tablet [id number] Yes One tablet every six hours for pain.  Limit 5 days. [name], MD Taking Active   levETIRAcetam (KEPPRA) 750 MG tablet [id number] Yes Take  1 tablet (750 mg total) by mouth 2 (two) times daily. Alric Ran, MD Taking Active            Med Note (ROBB, MELANIE A   Mon Aug 21, 2022  9:09 AM)    levocetirizine (XYZAL) 5  MG tablet [id number] No Take 1 tablet (5 mg total) by mouth every evening.  Patient not taking: Reported on 08/10/2022   Lindell Spar, MD Not Taking Active   omeprazole (PRILOSEC) 20 MG capsule [id number] No Take 1 capsule (20 mg total) by mouth daily.  Patient not taking: Reported on 08/10/2022   Lindell Spar, MD Not Taking Active   sertraline (ZOLOFT) 50 MG tablet [id number] Yes Take 1 tablet (50 mg total) by mouth daily. Lindell Spar, MD Taking Active   Vitamin D, Ergocalciferol, (DRISDOL) 1.25 MG (50000 UNIT) CAPS capsule [id number] No Take 1 capsule (50,000 Units total) by mouth every 7 (seven) days.  Patient not taking: Reported on 08/21/2022   Lindell Spar, MD Not Taking Active             Patient Active Problem List   Diagnosis Date Noted   Encounter for general adult medical examination with abnormal findings 07/27/2022   Hypokalemia 03/22/2022   Vitamin D deficiency 03/22/2022   Fall 03/22/2022   Current moderate episode of major depressive disorder without prior episode ([place]) 03/21/2022   Tobacco abuse 01/06/2022   Spinal stenosis of lumbar region 06/17/2021   Mass of lower outer quadrant of left breast 05/09/2021   Arthritis of left knee 12/02/2020   Chronic left shoulder pain 12/02/2020   Allergic sinusitis 12/02/2020   Mass of left finger    Anxiety 06/12/2019   Seizure disorder ([place]) 06/12/2019   Environmental and seasonal allergies 06/12/2019   Abnormal Pap smear of cervix 05/27/2019   Chronic midline low back pain with left-sided sciatica 01/12/2017   Essential hypertension 12/07/2016   Mild intermittent asthma without complication [id number]    Conditions to be addressed/monitored per PCP order:  Depression  Care Plan : LCSW Plan of Care  Updates made by Greg Cutter, LCSW since 08/23/2022 12:00 AM     Problem: Coping Skills for symptoms of Anxiety      Goal: Coping Skills Enhanced by connecting for ongoing therapy   Start Date: 07/20/2021   Recent Progress: On track  Priority: High  Note:   Current Barriers:  Disease Management support and education needs related to Anxiety with Panic Symptoms, and Stress at home  CSW Clinical Goal(s):  Patient  will work with [facility] LCSW to address needs related to managing symptoms of anxiety  through collaboration with Clinical Social Worker, provider, and care team Patient will gain counseling and psychiatry services to help increase mental health support  Interventions: Inter-disciplinary care team collaboration (see longitudinal plan of care) Evaluation of current treatment plan related to self management and patient's adherence to plan as established by provider Review resources, discussed options and provided patient information about  Department of Social Services ( currently receiving services ) Transportation provided by insurance provider (currently using) Discussed therapy with options based on need and insurance  BSW completed a telephone outreach with patient, she states she is not working and does not have any income. Patient states she is in need of resources for everything. BSW will mail patient a list of resources.  Mental Health:  (Status: New goal.) Evaluation of current treatment plan related to Anxiety with Panic Symptoms, Solution-Focused Strategies employed:  Mindfulness or Relaxation training provided Active listening / Reflection utilized  Emotional Support Provided Quality of sleep assessed & Sleep Hygiene techniques promoted  Provided EMMI education information on relieving stress, and insomnia getting a good night's rest Suicidal Ideation/Homicidal Ideation assessed: denied Collaborated with [facility] in [place] [phone]. Made referral to [facility]( patient has appointment 07/22/2021. Patient was unable to attend this appointment due to a family emergency. She has not rescheduled this appointment yet but will do so  today. 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 Patient reports that she has already completed her UDS, TB test and Criminal Background. Update New mental health referrals placed for psychiatry and counseling to [facility] [place]. 06/16/22 Update- Patient reports that she has not received a call from [facility] at [place]. One of the two referrals that [facility] LCSW made is denied. Patient is agreeable to contact agency next week (they close early on Fridays) to make sure she gets scheduled for her initial sessions. Emotional support provided. Mental Health resource contact information provided as well. 06/26/22 update- Patient reports that she has not heard from [facility] yet but will contact them today as they are closed today for the holiday. [facility] provided brief coping skill education for stress management. [facility] LCSW will follow up within 2 weeks. Patient reports that she successfully retrieved email that [facility] LCSW sent during last session. 07/10/22 update- [place] LCSW was informed by patient that her boyfriend of 2 months passed away over the weekend which has increased her symptoms of grief and depression. Grief support provided to patient and she was receptive to this. [facility] LCSW made call to [facility] [place] and spoke to staff. They will contact patient directly today to schedule her counseling appointment but her psychiatry referral will have to be reviewed first before scheduling even though that referral was made over a month ago. [facility] LCSW will follow up next month. LCSW 08/03/22 update- Patient is not picking up her depression medication or any other medication due to financial issues. [facility] LCSW will update Pharmacist that is involved in her care. Emotional support and crisis resource education provided. Patient was encouraged to consider [facility]  walk in clinic until her appointment next month at [facility] at [place], [place].  Patient reports that she is implementing appropriate boundaries to implement negative people, places and things. Patient works on her breathing exercises daily.  Patient denies any SI/HI. Patient reports that she continues to experience signs of grief but was able to turn it around and even let a balloon go in remembrance of her father. Positive reinforcement provided.   LCSW provided education on relaxation techniques such as meditation, deep breathing, massage, grounding exercsies or yoga that can activate the body's relaxation response and ease symptoms of stress and anxiety. LCSW ask that when pt is struggling with difficult emotions and racing thoughts that they start this relaxation response process. LCSW provided extensive education on healthy coping skills for anxiety. SW used active and reflective listening, validated patient's feelings/concerns, and provided emotional support. LCSW 08/23/22 update- Patient was reminded of her upcoming counseling appointment and she confirms stable transportation to this appointment.   Patient Self-Care Activities: Practice relaxed breathing 3 times a day     24- Hour Availability:    [facility]  [place], [place] [facility] [phone]   [facility] [employer] [place]  [phone]    [place]  [phone] (after hours)   Therapeutic Alternative/Mobile Crisis   [phone]   [place] National Suicide Hotline  351-610-6597 [place]ina Mon[place]) [place] 988   Call 911 or go to emergency room   [facility]  [phone]);  [place] and [employer]  [phone]); [place], [place], [place], [place], [place], [place], [place]        10 LITTLE Things To Do When You're Feeling Too Down To Do  Anything  Take a shower. Even if you plan to stay in all day long and not see a soul, take a shower. It takes the most effort to hop in to the shower but once you do, you'll feel immediate results. It will wake you up and you'll be feeling much fresher (and cleaner too).  Brush and floss your teeth. Give your teeth a good brushing with a floss finish. It's a small task but it feels so good and you can check 'taking care of your health' off the list of things to do.  Do something small on your list. Most of [place] have some small thing we would like to get done (load of laundry, sew a button, email a friend). Doing one of these things will make you feel like you've accomplished something.  Drink water. Drinking water is easy right? It's also really beneficial for your health so keep a glass beside you all day and take sips often. It gives you energy and prevents you from boredom eating.  Do some floor exercises. The last thing you want to do is exercise but it might be just the thing you need the most. Keep it simple and do exercises that involve sitting or laying on the floor. Even the smallest of exercises release chemicals in the brain that make you feel good. Yoga stretches or core exercises are going to make you feel good with minimal effort.  Make your bed. Making your bed takes a few minutes but it's productive and you'll feel relieved when it's done. An unmade bed is a huge visual reminder that you're having an unproductive day. Do it and consider it your housework for the day.  Put on some nice clothes. Take the sweatpants off even if you don't plan to go anywhere. Put on clothes that make you feel good. Take a look in the mirror so your brain recognizes the sweatpants have been replaced with clothes that make you look great. It's an instant confidence booster.  Wash the dishes. A pile of dirty dishes in the sink is a reflection of your mood. It's possible that if you wash up the dishes,  your mood will follow suit. It's worth a try.  Cook a real meal. If you have the luxury to have a "do nothing" day, you have time to make a real meal for yourself. Make a meal that you love to eat. The process is good to get you out of the funk and the food will ensure you have more energy for tomorrow.  Write out your thoughts by hand. When you hand write, you stimulate your brain to focus on the moment that you're in so make yourself comfortable and write whatever comes into your mind. Put those thoughts out on paper so they stop spinning around in your head. Those thoughts might  be the very thing holding you down.      Follow up:  Patient agrees to Care Plan and Follow-up.  Plan: The Managed Medicaid care management team will reach out to the patient again over the next 30 days.  Date/time of next scheduled Social Work care management/care coordination outreach:  09/07/22 at 9 am  Eula Fried, BSW, MSW, [place] Medicaid LCSW [email or web address] Phone: [phone]

## 2022-08-24 ENCOUNTER — Ambulatory Visit (HOSPITAL_COMMUNITY)
Admission: RE | Admit: 2022-08-24 | Discharge: 2022-08-24 | Disposition: A | Discharge status: Home / Self Care | Payer: Commercial Managed Care - HMO | Source: Ambulatory Visit | Attending: Orthopedic Surgery | Admitting: Orthopedic Surgery

## 2022-08-24 DIAGNOSIS — M5442 Lumbago with sciatica, left side: Secondary | ICD-10-CM | POA: Diagnosis present

## 2022-08-24 DIAGNOSIS — G8929 Other chronic pain: Secondary | ICD-10-CM | POA: Diagnosis present

## 2022-08-24 DIAGNOSIS — M545 Low back pain, unspecified: Secondary | ICD-10-CM | POA: Diagnosis not present

## 2022-08-28 ENCOUNTER — Ambulatory Visit: Payer: Commercial Managed Care - HMO | Admitting: Orthopedic Surgery

## 2022-08-28 ENCOUNTER — Telehealth: Payer: Self-pay | Admitting: Orthopedic Surgery

## 2022-08-28 NOTE — Telephone Encounter (Signed)
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  She needs to see a Licensed conveyancer.  I told her that she does not need to come in today.  I also left a note for the staff to stop scheduling her to see me for back pain.  She does not have any knee pathology does not need knee surgery.  Dr. Luna Glasgow has been following her for chronic back pain chronic pain and chronic opioid management.  I have asked our staff to set her up to see a back specialist.

## 2022-08-29 ENCOUNTER — Telehealth: Payer: Self-pay | Admitting: Orthopaedic Surgery

## 2022-08-29 MED ORDER — HYDROCODONE-ACETAMINOPHEN 5-325 MG PO TABS: ORAL_TABLET | ORAL | 0 refills | Status: DC

## 2022-08-29 NOTE — Telephone Encounter (Signed)
Dr. Brooke Bonito patient - patient called, lvm stating her medicine is due today.  She would like a refill on Hydrocodone 5-325 to be sent to [facility] on [address].  Pt's # [phone]

## 2022-08-31 ENCOUNTER — Ambulatory Visit (HOSPITAL_COMMUNITY): Payer: Commercial Managed Care - HMO | Admitting: Clinical

## 2022-09-05 ENCOUNTER — Telehealth: Payer: Self-pay | Admitting: Orthopaedic Surgery

## 2022-09-05 MED ORDER — HYDROCODONE-ACETAMINOPHEN 5-325 MG PO TABS: ORAL_TABLET | ORAL | 0 refills | Status: DC

## 2022-09-05 NOTE — Telephone Encounter (Signed)
Dr. Brooke Bonito patient - spoke w/the patient, she is requesting a refill on her Hydrocodone 5-325 to be sent to [facility] on [address].  She stated it's due today.  She also wants to know the status of her appointment for her neck, stated Dr. Aline Brochure was sending her somewhere and she hasn't heard anything.

## 2022-09-07 ENCOUNTER — Other Ambulatory Visit: Payer: Commercial Managed Care - HMO | Admitting: Licensed Clinical Social Worker

## 2022-09-07 NOTE — Patient Outreach (Signed)
Medicaid Managed Care Social Work Note  09/07/2022 Name:  Judy Cross MRN:  [id number] DOB:  [birth year]  Judy Cross is an 57 y.o. year old female who is a primary patient of Lindell Spar, MD.  The Medicaid Managed Care Coordination team was consulted for assistance with:  [place] and Resources  Ms. Lahrman was given information about Medicaid Managed Care Coordination team services today. [name] Patient agreed to services and verbal consent obtained.  Engaged with patient  for by telephone forfollow up visit in response to referral for case management and/or care coordination services.   Assessments/Interventions:  Review of past medical history, allergies, medications, health status, including review of consultants reports, laboratory and other test data, was performed as part of comprehensive evaluation and provision of chronic care management services.  SDOH: (Social Determinant of Health) assessments and interventions performed: SDOH Interventions    Flowsheet Row Patient Outreach Telephone from 09/07/2022 in [place] Patient Outreach Telephone from 08/23/2022 in [place] Patient Outreach Telephone from 08/21/2022 in [place] Patient Outreach Telephone from 08/10/2022 in [place] Patient Outreach Telephone from 08/03/2022 in [place] Patient Outreach Telephone from 07/10/2022 in [place] Interventions        Food Insecurity Interventions -- -- Intervention Not Indicated -- -- --  Housing Interventions -- -- --  [Currently staying with her son. Waiting for disability determination] -- -- --  Transportation Interventions -- -- -- Intervention Not Indicated -- --  Depression Interventions/Treatment  -- -- -- -- Counseling --  Stress Interventions Offered [employer], Provide Counseling Offered [employer], Provide Counseling -- -- Offered [employer], Provide Counseling Offered [employer] Resources, Provide Counseling       Advanced Directives Status:  See Care Plan for related entries.  Care Plan                 Allergies  Allergen Reactions   Lisinopril Swelling    swelling to entire mouth.   Penicillins Rash    Has patient had a PCN reaction causing immediate rash, facial/tongue/throat swelling, SOB or lightheadedness with hypotension: no - next day Has patient had a PCN reaction causing severe rash involving mucus membranes or skin necrosis: No Has patient had a PCN reaction that required hospitalization No Has patient had a PCN reaction occurring within the last 10 years: Yes If all of the above answers are "NO", then may proceed with Cephalosporin use.    Tramadol Other (See Comments)    dizziness    Medications Reviewed Today     Reviewed by Melissa Montane, RN (Registered Nurse) on 08/21/22 at [place] List Status: <None>   Medication Order Taking? Sig Documenting Provider Last Dose Status Informant  albuterol (VENTOLIN HFA) 108 (90 Base) MCG/ACT inhaler [id number] No Inhale 2 puffs into the lungs every 6 (six) hours as needed for wheezing or shortness of breath.  Patient not taking: Reported on 08/21/2022   Lindell Spar, MD Not Taking Active   hydrochlorothiazide (HYDRODIURIL) 25 MG tablet [id number] Yes Take 1 tablet (25 mg total) by mouth daily. Lindell Spar, MD Taking Active   HYDROcodone-acetaminophen (NORCO/VICODIN) 5-325 MG tablet [id number] Yes One tablet every six hours for pain.  Limit 5 days. [name], MD Taking Active   levETIRAcetam (KEPPRA) 750 MG tablet [id number] Yes Take  1 tablet (750 mg total) by mouth 2 (two) times daily. Alric Ran, MD Taking Active            Med Note (ROBB, MELANIE A   Mon Aug 21, 2022  9:09 AM)    levocetirizine (XYZAL) 5  MG tablet [id number] No Take 1 tablet (5 mg total) by mouth every evening.  Patient not taking: Reported on 08/10/2022   Lindell Spar, MD Not Taking Active   omeprazole (PRILOSEC) 20 MG capsule [id number] No Take 1 capsule (20 mg total) by mouth daily.  Patient not taking: Reported on 08/10/2022   Lindell Spar, MD Not Taking Active   sertraline (ZOLOFT) 50 MG tablet [id number] Yes Take 1 tablet (50 mg total) by mouth daily. Lindell Spar, MD Taking Active   Vitamin D, Ergocalciferol, (DRISDOL) 1.25 MG (50000 UNIT) CAPS capsule [id number] No Take 1 capsule (50,000 Units total) by mouth every 7 (seven) days.  Patient not taking: Reported on 08/21/2022   Lindell Spar, MD Not Taking Active             Patient Active Problem List   Diagnosis Date Noted   Encounter for general adult medical examination with abnormal findings 07/27/2022   Hypokalemia 03/22/2022   Vitamin D deficiency 03/22/2022   Fall 03/22/2022   Current moderate episode of major depressive disorder without prior episode ([place]) 03/21/2022   Tobacco abuse 01/06/2022   Spinal stenosis of lumbar region 06/17/2021   Mass of lower outer quadrant of left breast 05/09/2021   Arthritis of left knee 12/02/2020   Chronic left shoulder pain 12/02/2020   Allergic sinusitis 12/02/2020   Mass of left finger    Anxiety 06/12/2019   Seizure disorder ([place]) 06/12/2019   Environmental and seasonal allergies 06/12/2019   Abnormal Pap smear of cervix 05/27/2019   Chronic midline low back pain with left-sided sciatica 01/12/2017   Essential hypertension 12/07/2016   Mild intermittent asthma without complication [id number]    Conditions to be addressed/monitored per PCP order:  [place] : LCSW Plan of Care  Updates made by Greg Cutter, LCSW since 09/07/2022 12:00 AM     Problem: Coping Skills for symptoms of Anxiety      Goal: Coping Skills Enhanced by connecting for ongoing therapy   Start Date: 07/20/2021   Recent Progress: On track  Priority: High  Note:   Current Barriers:  Disease Management support and education needs related to Anxiety with Panic Symptoms, and Stress at home  CSW Clinical Goal(s):  Patient  will work with [facility] LCSW to address needs related to managing symptoms of anxiety  through collaboration with Clinical Social Worker, provider, and care team Patient will gain counseling and psychiatry services to help increase mental health support  Interventions: Inter-disciplinary care team collaboration (see longitudinal plan of care) Evaluation of current treatment plan related to self management and patient's adherence to plan as established by provider Review resources, discussed options and provided patient information about  Department of Social Services ( currently receiving services ) Transportation provided by insurance provider (currently using) Discussed therapy with options based on need and insurance  BSW completed a telephone outreach with patient, she states she is not working and does not have any income. Patient states she is in need of resources for everything. BSW will mail patient a list of resources.  Mental Health:  (Status: New goal.) Evaluation of current treatment plan related to Anxiety with Panic Symptoms, Solution-Focused Strategies employed:  Mindfulness or Relaxation training provided Active listening / Reflection utilized  Emotional Support Provided Quality of sleep assessed & Sleep Hygiene techniques promoted  Provided EMMI education information on relieving stress, and insomnia getting a good night's rest Suicidal Ideation/Homicidal Ideation assessed: denied Collaborated with [facility] in [place] [phone]. Made referral to [facility]( patient has appointment 07/22/2021. Patient was unable to attend this appointment due to a family emergency. She has not rescheduled this appointment yet but will do so  today. 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 Patient reports that she has already completed her UDS, TB test and Criminal Background. Update New mental health referrals placed for psychiatry and counseling to [facility] [place]. 06/16/22 Update- Patient reports that she has not received a call from [facility] [place]. One of the two referrals that [facility] LCSW made is denied. Patient is agreeable to contact agency next week (they close early on Fridays) to make sure she gets scheduled for her initial sessions. Emotional support provided. Mental Health resource contact information provided as well. 06/26/22 update- Patient reports that she has not heard from [facility] yet but will contact them today as they are closed today for the holiday. [facility] provided brief coping skill education for stress management. [facility] LCSW will follow up within 2 weeks. Patient reports that she successfully retrieved email that [facility] LCSW sent during last session. 07/10/22 update- [place] LCSW was informed by patient that her boyfriend of 2 months passed away over the weekend which has increased her symptoms of grief and depression. Grief support provided to patient and she was receptive to this. [facility] LCSW made call to [facility] [place] and spoke to staff. They will contact patient directly today to schedule her counseling appointment but her psychiatry referral will have to be reviewed first before scheduling even though that referral was made over a month ago. [facility] LCSW will follow up next month. LCSW 08/03/22 update- Patient is not picking up her depression medication or any other medication due to financial issues. [facility] LCSW will update Pharmacist that is involved in her care. Emotional support and crisis resource education provided. Patient was encouraged to consider [facility]  walk in clinic until her appointment next month at [facility] at [place], [place].  Patient reports that she is implementing appropriate boundaries to implement negative people, places and things. Patient works on her breathing exercises daily.  Patient denies any SI/HI. Patient reports that she continues to experience signs of grief but was able to turn it around and even let a balloon go in remembrance of her father. Positive reinforcement provided.   LCSW provided education on relaxation techniques such as meditation, deep breathing, massage, grounding exercsies or yoga that can activate the body's relaxation response and ease symptoms of stress and anxiety. LCSW ask that when pt is struggling with difficult emotions and racing thoughts that they start this relaxation response process. LCSW provided extensive education on healthy coping skills for anxiety. SW used active and reflective listening, validated patient's feelings/concerns, and provided emotional support. LCSW 08/23/22 update- Patient was reminded of upcoming counseling appointment and she confirms that she will be able to attend this appointment. LCSW 09/07/22 update- Patient was a NO SHOW for scheduled counseling appointment. Patient reports that "I'm not as stressed now that I recently  got approved for disability but I would still like to gain therapy." Patient was advised that she must be the one to contact [place] [facility] to reschedule appointment because agency prefers to talk to patient directly. Patient is agreeable to contact agency today. [facility] LCSW will close referral soon as patient has been provided extension mental health resource education and stress management coping skill education.   Patient Self-Care Activities: Practice relaxed breathing 3 times a day     24- Hour Availability:    [facility]  [address] [place], [place] Crisis  [phone]   Family Service of the [employer] [place]  [phone]    [place]  [phone] (after hours)   Therapeutic Alternative/Mobile Crisis   [phone]   [place] National Suicide Hotline  (406)129-8060 (TALK) OR 988   Call 911 or go to emergency room   [facility]  [phone]);  [place] and [employer]  [phone]); [place], [place], [place], [place], [place], [place], [place]        10 LITTLE Things To Do When You're Feeling Too Down To Do Anything  Take a shower. Even if you plan to stay in all day long and not see a soul, take a shower. It takes the most effort to hop in to the shower but once you do, you'll feel immediate results. It will wake you up and you'll be feeling much fresher (and cleaner too).  Brush and floss your teeth. Give your teeth a good brushing with a floss finish. It's a small task but it feels so good and you can check 'taking care of your health' off the list of things to do.  Do something small on your list. Most of [place] have some small thing we would like to get done (load of laundry, sew a button, email a friend). Doing one of these things will make you feel like you've accomplished something.  Drink water. Drinking water is easy right? It's also really beneficial for your health so keep a glass beside you all day and take sips often. It gives you energy and prevents you from boredom eating.  Do some floor exercises. The last thing you want to do is exercise but it might be just the thing you need the most. Keep it simple and do exercises that involve sitting or laying on the floor. Even the smallest of exercises release chemicals in the brain that make you feel good. Yoga stretches or core exercises are going to make you feel good with minimal effort.  Make your bed. Making your bed takes a few minutes but it's productive and you'll feel  relieved when it's done. An unmade bed is a huge visual reminder that you're having an unproductive day. Do it and consider it your housework for the day.  Put on some nice clothes. Take the sweatpants off even if you don't plan to go anywhere. Put on clothes that make you feel good. Take a look in the mirror so your brain recognizes the sweatpants have been replaced with clothes that make you look great. It's an instant confidence booster.  Wash the dishes. A pile of dirty dishes in the sink is a reflection of your mood. It's possible that if you wash up the dishes, your mood will follow suit. It's worth a try.  Cook a real meal. If you have the luxury to have  a "do nothing" day, you have time to make a real meal for yourself. Make a meal that you love to eat. The process is good to get you out of the funk and the food will ensure you have more energy for tomorrow.  Write out your thoughts by hand. When you hand write, you stimulate your brain to focus on the moment that you're in so make yourself comfortable and write whatever comes into your mind. Put those thoughts out on paper so they stop spinning around in your head. Those thoughts might be the very thing holding you down.      Follow up:  Patient agrees to Care Plan and Follow-up.  Plan: The Managed Medicaid care management team will reach out to the patient again over the next 30 days.  Date/time of next scheduled Social Work care management/care coordination outreach:  09/19/22 at [place], [place], MSW, [place] Medicaid LCSW [email or web address] Phone: [phone]

## 2022-09-07 NOTE — Patient Instructions (Signed)
Visit Information  Ms. Blatter was given information about Medicaid Managed Care team care coordination services as a part of their [place] benefit. Freeda RESSIE DIAMOND verbally consented to engagement with the [facility] Managed Care team.   If you are experiencing a medical emergency, please call 911 or report to your local emergency department or urgent care.   If you have a non-emergency medical problem during routine business hours, please contact your provider's office and ask to speak with a nurse.   For questions related to your [facility], please call: [phone] or visit the homepage here: [email or web address]  If you would like to schedule transportation through your [facility], please call the following number at least 2 days in advance of your appointment: [phone]   Rides for urgent appointments can also be made after hours by calling Member Services.  Call the [place] at [phone], at any time, 24 hours a day, 7 days a week. If you are in danger or need immediate medical attention call 911.  If you would like help to quit smoking, call 1-800-QUIT-NOW (702)121-2433) OR Espaol: 1-855-Djelo-Ya QO:409462) o para ms informacin haga clic aqu or Text READY to 200-400 to register via text   Following is a copy of your plan of care:  Care Plan : LCSW Plan of Care  Updates made by Greg Cutter, LCSW since 09/07/2022 12:00 AM     Problem: Coping Skills for symptoms of Anxiety      Goal: Coping Skills Enhanced by connecting for ongoing therapy   Start Date: 07/20/2021  Recent Progress: On track  Priority: High  Note:   Current Barriers:  Disease Management support and education needs related to Anxiety with Panic Symptoms, and Stress at home  CSW Clinical Goal(s):  Patient  will work with [facility] LCSW to  address needs related to managing symptoms of anxiety  through collaboration with Clinical Social Worker, provider, and care team Patient will gain counseling and psychiatry services to help increase mental health support  Patient Self-Care Activities: Practice relaxed breathing 3 times a day     24- Hour Availability:    [facility]  [address] [place], [place] Crisis [phone]   Family Service of the [employer] [phone]   [place]  [phone]    [place]  [phone] (after hours)   Therapeutic Alternative/Mobile Crisis   [phone]   [place] National Suicide Hotline  (915)279-9927 (TALK) OR 988   Call 911 or go to emergency room   [facility]  [phone]);  [place] and [employer]  [phone]); [place], [place], [place], [place], [place], [place], [place]        10 LITTLE Things To Do When You're Feeling Too Down To Do Anything  Take a shower. Even if you plan to stay in all day long and not see a soul, take a shower. It takes the most effort to hop in to the shower but once you do, you'll feel immediate results. It will wake you up and you'll be feeling much fresher (and cleaner too).  Brush and floss your teeth. Give your teeth a good brushing with a floss finish. It's a small task but it feels so good and you can check 'taking care of your health' off the list of things to do.  Do something small on your list. Most  of [place] have some small thing we would like to get done (load of laundry, sew a button, email a friend). Doing one of these things will make you feel like you've accomplished something.  Drink water. Drinking water is easy right? It's also really beneficial for your health so keep a glass beside you all day and take sips often. It gives you energy and prevents you from boredom eating.  Do some floor  exercises. The last thing you want to do is exercise but it might be just the thing you need the most. Keep it simple and do exercises that involve sitting or laying on the floor. Even the smallest of exercises release chemicals in the brain that make you feel good. Yoga stretches or core exercises are going to make you feel good with minimal effort.  Make your bed. Making your bed takes a few minutes but it's productive and you'll feel relieved when it's done. An unmade bed is a huge visual reminder that you're having an unproductive day. Do it and consider it your housework for the day.  Put on some nice clothes. Take the sweatpants off even if you don't plan to go anywhere. Put on clothes that make you feel good. Take a look in the mirror so your brain recognizes the sweatpants have been replaced with clothes that make you look great. It's an instant confidence booster.  Wash the dishes. A pile of dirty dishes in the sink is a reflection of your mood. It's possible that if you wash up the dishes, your mood will follow suit. It's worth a try.  Cook a real meal. If you have the luxury to have a "do nothing" day, you have time to make a real meal for yourself. Make a meal that you love to eat. The process is good to get you out of the funk and the food will ensure you have more energy for tomorrow.  Write out your thoughts by hand. When you hand write, you stimulate your brain to focus on the moment that you're in so make yourself comfortable and write whatever comes into your mind. Put those thoughts out on paper so they stop spinning around in your head. Those thoughts might be the very thing holding you down.

## 2022-09-08 ENCOUNTER — Ambulatory Visit (INDEPENDENT_AMBULATORY_CARE_PROVIDER_SITE_OTHER): Payer: Commercial Managed Care - HMO | Admitting: Internal Medicine

## 2022-09-08 ENCOUNTER — Encounter: Payer: Self-pay | Admitting: Internal Medicine

## 2022-09-08 VITALS — BP 123/71 | HR 58 | Ht 61.0 in | Wt 131.4 lb

## 2022-09-08 DIAGNOSIS — F331 Major depressive disorder, recurrent, moderate: Secondary | ICD-10-CM | POA: Diagnosis not present

## 2022-09-08 DIAGNOSIS — I10 Essential (primary) hypertension: Secondary | ICD-10-CM

## 2022-09-08 DIAGNOSIS — M5136 Other intervertebral disc degeneration, lumbar region: Secondary | ICD-10-CM | POA: Diagnosis not present

## 2022-09-08 DIAGNOSIS — G40909 Epilepsy, unspecified, not intractable, without status epilepticus: Secondary | ICD-10-CM

## 2022-09-08 LAB — BASIC METABOLIC PANEL
BUN/Creatinine Ratio: 13 (ref 9–23)
BUN: 9 mg/dL (ref 6–24)
CO2: 23 mmol/L (ref 20–29)
Calcium: 9.8 mg/dL (ref 8.7–10.2)
Chloride: 101 mmol/L (ref 96–106)
Creatinine, Ser: 0.71 mg/dL (ref 0.57–1.00)
Glucose: 108 mg/dL — ABNORMAL HIGH (ref 70–99)
Potassium: 3.7 mmol/L (ref 3.5–5.2)
Sodium: 141 mmol/L (ref 134–144)
eGFR: 99 mL/min/{1.73_m2} (ref >59)

## 2022-09-08 LAB — HEMOGLOBIN A1C
Est. average glucose Bld gHb Est-mCnc: 120 mg/dL
Hgb A1c MFr Bld: 5.8 % — ABNORMAL HIGH (ref 4.8–5.6)

## 2022-09-08 MED ORDER — METHYLPREDNISOLONE ACETATE 80 MG/ML IJ SUSP
80.0000 mg | Freq: Once | INTRAMUSCULAR | Status: AC
  Administered 2022-09-08: 80 mg via INTRAMUSCULAR

## 2022-09-08 MED ORDER — KETOROLAC TROMETHAMINE 60 MG/2ML IM SOLN
60.0000 mg | Freq: Once | INTRAMUSCULAR | Status: AC
  Administered 2022-09-08: 60 mg via INTRAMUSCULAR

## 2022-09-08 NOTE — Assessment & Plan Note (Signed)
BP Readings from Last 1 Encounters:  09/08/22 123/71   Usually well-controlled with HCTZ, but has run out of her meds, had financial constraints Counseled for compliance with the medications Advised DASH diet and moderate exercise/walking as tolerated

## 2022-09-08 NOTE — Addendum Note (Signed)
Addended byIhor Dow on: 09/08/2022 09:42 AM   Modules accepted: Level of Service

## 2022-09-08 NOTE — Assessment & Plan Note (Addendum)
Her episodes of syncope likely due to seizures, although her recent dizziness is likely presyncope - needs to maintain adequate hydration and avoid skipping meals On Keppra Followed by Neurology Advised to go to ER if she has any episode of syncope or seizure

## 2022-09-08 NOTE — Progress Notes (Signed)
Established Patient Office Visit  Subjective:  Patient ID: Judy Cross, female    DOB: [birth year]  Age: 57 y.o. MRN: [id number]  CC:  Chief Complaint  Patient presents with   Hypertension    Follow up. Patient is in a lot of pain from her back and knee.    HPI Judy Cross is a 57 y.o. female with past medical history of HTN, asthma, seizure disorder, lumbar spinal stenosis, tobacco abuse and OA of knee who presents for f/u of her chronic medical conditions.  HTN: Her BP is well controlled today.  She is taking hydrochlorothiazide regularly now.  Denies any headache, chest pain, dyspnea or palpitations currently.  She has not had episodes of dizziness, likely presyncope due to inadequate hydration and could be due to opioid medications.  She has history of chronic low back pain, followed by orthopedic surgeon.  She has history of lumbar spinal stenosis and takes Norco as needed for it.  She has chronic weakness of the LE from it.  She also complains of left knee pain.  She has received steroid injections and follows up with Dr. Luna Glasgow.  Seizure disorder: She denies any episode of shaking or seizures recently.  She was placed on Keppra in the last visit as she was having episodes of seizures, and has been able to see neurologist.  MDD: She complains of anhedonia, and lack of interest in her routine activities, insomnia, lack of concentration, crying spells and agitation.  Denies SI or HI currently.  Her dose of Zoloft was recently increased to 50 mg QD.      Past Medical History:  Diagnosis Date   Allergy    Anxiety 06/12/2019   Arthritis    Asthma in adult, mild intermittent, uncomplicated [id number]   Breast nodule 10/12/2015   Breast nodule 10/12/2015   Bulge of lumbar disc without myelopathy    Endometrial polyp 04/28/2013   Enlarged uterus 03/26/2013   Fibroids 04/28/2013   Hot flashes 03/26/2013   Hot flashes 03/26/2013   Hypertension    Irregular bleeding  03/26/2013   Neuromuscular disorder ([place])    sciatica   Seasonal allergies    Seizures ([place])    last seizure was 2 years ago; unknown etiology. On Keppra.   Trichomoniasis 10/20/2020   tx 10/20/20  POC___    Past Surgical History:  Procedure Laterality Date   ABLATION     with polyp removal.   BREAST BIOPSY Left    neg   CESAREAN SECTION     x 3   COLONOSCOPY WITH PROPOFOL N/A 01/11/2016   Surgeon: Danie Binder, MD; mild diverticulosis in proximal ascending colon, nonbleeding internal hemorrhoids.  Repeat in 10 years.   EXAMINATION UNDER ANESTHESIA  02/28/2012   Procedure: EXAM UNDER ANESTHESIA;  Surgeon: Donato Heinz, MD;  Location: [facility];  Service: General;  Laterality: N/A;   EXCISION MASS UPPER EXTREMETIES Left 11/13/2019   Procedure: EXCISION MASS UPPER EXTREMETIES ring finger left;  Surgeon: Carole Civil, MD;  Location: [facility];  Service: Orthopedics;  Laterality: Left;   GANGLION CYST EXCISION Left    HYSTEROSCOPY WITH D & C N/A 05/23/2013   Procedure: DILATATION AND CURETTAGE /HYSTEROSCOPY;  Surgeon: Jonnie Kind, MD;  Location: [facility];  Service: Gynecology;  Laterality: N/A;   POLYPECTOMY N/A 05/23/2013   Procedure: ENDOMETRIAL POLYP REMOVAL;  Surgeon: Jonnie Kind, MD;  Location: [facility];  Service: Gynecology;  Laterality: N/A;   SPHINCTEROTOMY  02/28/2012  Procedure: SPHINCTEROTOMY;  Surgeon: Donato Heinz, MD;  Location: [facility];  Service: General;  Laterality: N/A;  Lateral Internal Sphincterotomy   TRIGGER FINGER RELEASE Left    ring finger   TUBAL LIGATION      Family History  Problem Relation Age of Onset   Hypertension Mother    Arthritis Mother    COPD Mother    Diabetes Mother    Heart disease Mother    Hypertension Father    Parkinson's disease Father    Colon cancer Neg Hx     Social History   Socioeconomic History   Marital status: Single    Spouse name: Not on file   Number of children: 3   Years of education: 14    Highest education level: Not on file  Occupational History   Occupation: home health aid  Tobacco Use   Smoking status: Some Days    Packs/day: 0.25    Years: 20.00    Additional pack years: 0.00    Total pack years: 5.00    Types: Cigarettes   Smokeless tobacco: Never   Tobacco comments:    smokes 1 cig daily  Vaping Use   Vaping Use: Never used  Substance and Sexual Activity   Alcohol use: Yes    Comment: occasional   Drug use: No   Sexual activity: Yes    Birth control/protection: Surgical    Comment: tubal and ablation  Other Topics Concern   Not on file  Social History Narrative   Degree in child care   Currently is a home health aid   Lives at home with youngest daughter Jeanella Craze senior this year    Son 24-Jalon  in college for sociology    Daughter Charco      Enjoys: write, reading, cooking, drawing      Diet: eats all food groups   Caffeine: pepsi weekly    Water: 6-8 cups    Juice some times      Wears seat belt    Smoke detectors    Does not use phone with driving.   Social Determinants of Health   Financial Resource Strain: High Risk (05/30/2022)   Overall Financial Resource Strain (CARDIA)    Difficulty of Paying Living Expenses: Hard  Food Insecurity: Food Insecurity Present (08/21/2022)   Hunger Vital Sign    Worried About Running Out of Food in the Last Year: Sometimes true    Ran Out of Food in the Last Year: Sometimes true  Transportation Needs: No Transportation Needs (08/10/2022)   PRAPARE - Hydrologist (Medical): No    Lack of Transportation (Non-Medical): No  Physical Activity: Sufficiently Active (10/12/2020)   Exercise Vital Sign    Days of Exercise per Week: 5 days    Minutes of Exercise per Session: 40 min  Stress: No Stress Concern Present (09/07/2022)   [place]    Feeling of Stress : Only a little  Recent  Concern: Stress - Stress Concern Present (08/23/2022)   [place]    Feeling of Stress : To some extent  Social Connections: Moderately Isolated (10/12/2020)   Social Connection and Isolation Panel [NHANES]    Frequency of Communication with Friends and Family: Three times a week    Frequency of Social Gatherings with Friends and Family: Once a week    Attends Religious Services:  More than 4 times per year    Active Member of Clubs or Organizations: No    Attends Archivist Meetings: Never    Marital Status: Never married  Intimate Partner Violence: Not At Risk (10/12/2020)   Humiliation, Afraid, Rape, and Kick questionnaire    Fear of Current or Ex-Partner: No    Emotionally Abused: No    Physically Abused: No    Sexually Abused: No    Outpatient Medications Prior to Visit  Medication Sig Dispense Refill   albuterol (VENTOLIN HFA) 108 (90 Base) MCG/ACT inhaler Inhale 2 puffs into the lungs every 6 (six) hours as needed for wheezing or shortness of breath. (Patient not taking: Reported on 08/21/2022) 8 g 11   hydrochlorothiazide (HYDRODIURIL) 25 MG tablet Take 1 tablet (25 mg total) by mouth daily. 90 tablet 3   HYDROcodone-acetaminophen (NORCO/VICODIN) 5-325 MG tablet One tablet every six hours for pain.  Limit 5 days. 11 tablet 0   levETIRAcetam (KEPPRA) 750 MG tablet Take 1 tablet (750 mg total) by mouth 2 (two) times daily. 120 tablet 11   levocetirizine (XYZAL) 5 MG tablet Take 1 tablet (5 mg total) by mouth every evening. (Patient not taking: Reported on 08/10/2022) 90 tablet 3   omeprazole (PRILOSEC) 20 MG capsule Take 1 capsule (20 mg total) by mouth daily. (Patient not taking: Reported on 08/10/2022) 90 capsule 1   sertraline (ZOLOFT) 50 MG tablet Take 1 tablet (50 mg total) by mouth daily. 90 tablet 1   Vitamin D, Ergocalciferol, (DRISDOL) 1.25 MG (50000 UNIT) CAPS capsule Take 1 capsule (50,000 Units total) by  mouth every 7 (seven) days. (Patient not taking: Reported on 08/21/2022) 12 capsule 1   No facility-administered medications prior to visit.    Allergies  Allergen Reactions   Lisinopril Swelling    swelling to entire mouth.   Penicillins Rash    Has patient had a PCN reaction causing immediate rash, facial/tongue/throat swelling, SOB or lightheadedness with hypotension: no - next day Has patient had a PCN reaction causing severe rash involving mucus membranes or skin necrosis: No Has patient had a PCN reaction that required hospitalization No Has patient had a PCN reaction occurring within the last 10 years: Yes If all of the above answers are "NO", then may proceed with Cephalosporin use.    Tramadol Other (See Comments)    dizziness    ROS Review of Systems  Constitutional:  Positive for fatigue. Negative for chills and fever.  HENT:  Negative for postnasal drip, sinus pressure, sinus pain, sore throat and trouble swallowing.   Eyes:  Negative for pain and discharge.  Respiratory:  Negative for cough and shortness of breath.   Cardiovascular:  Negative for chest pain and palpitations.  Gastrointestinal:  Negative for diarrhea, nausea and vomiting.  Genitourinary:  Negative for dysuria and hematuria.  Musculoskeletal:  Positive for arthralgias, back pain and neck pain.  Skin:  Negative for rash.  Neurological:  Positive for seizures and syncope. Negative for weakness and numbness.      Objective:    Physical Exam Vitals reviewed.  Constitutional:      General: She is not in acute distress.    Appearance: She is not diaphoretic.  HENT:     Head: Normocephalic and atraumatic.     Nose: Nose normal.     Mouth/Throat:     Mouth: Mucous membranes are moist.  Eyes:     General: No scleral icterus.    Extraocular Movements: Extraocular  movements intact.  Cardiovascular:     Rate and Rhythm: Normal rate and regular rhythm.     Pulses: Normal pulses.     Heart sounds:  Normal heart sounds. No murmur heard. Pulmonary:     Breath sounds: Normal breath sounds. No wheezing or rales.  Musculoskeletal:        General: Tenderness (Left knee, with minimal swelling) present.     Cervical back: Neck supple. No tenderness. Decreased range of motion.     Lumbar back: Tenderness present. Decreased range of motion.     Right lower leg: No edema.     Left lower leg: No edema.  Skin:    General: Skin is warm.     Findings: No rash.  Neurological:     General: No focal deficit present.     Mental Status: She is alert and oriented to person, place, and time.     Sensory: No sensory deficit.     Motor: Weakness (B/l LE - 3/5) present.  Psychiatric:        Mood and Affect: Mood normal.        Behavior: Behavior normal.     BP 123/71 (BP Location: Left Arm, Patient Position: Sitting, Cuff Size: Normal)   Pulse (!) 58   Ht 5\' 1"  (1.549 m)   Wt 131 lb 6.4 oz (59.6 kg)   SpO2 98%   BMI 24.83 kg/m  Wt Readings from Last 3 Encounters:  09/08/22 131 lb 6.4 oz (59.6 kg)  07/27/22 137 lb 6.4 oz (62.3 kg)  05/27/22 135 lb (61.2 kg)    Lab Results  Component Value Date   TSH 0.634 03/21/2022   Lab Results  Component Value Date   WBC 5.9 04/15/2022   HGB 12.9 04/15/2022   HCT 37.8 04/15/2022   MCV 95.7 04/15/2022   PLT 229 04/15/2022   Lab Results  Component Value Date   NA 141 09/07/2022   K 3.7 09/07/2022   CO2 23 09/07/2022   GLUCOSE 108 (H) 09/07/2022   BUN 9 09/07/2022   CREATININE 0.71 09/07/2022   BILITOT 1.1 03/21/2022   ALKPHOS 73 03/21/2022   AST 19 03/21/2022   ALT 13 03/21/2022   PROT 7.4 03/21/2022   ALBUMIN 4.8 03/21/2022   CALCIUM 9.8 09/07/2022   ANIONGAP 8 04/15/2022   EGFR 99 09/07/2022   Lab Results  Component Value Date   CHOL 193 03/21/2022   Lab Results  Component Value Date   HDL 68 03/21/2022   Lab Results  Component Value Date   LDLCALC 75 03/21/2022   Lab Results  Component Value Date   TRIG 317 (H)  03/21/2022   Lab Results  Component Value Date   CHOLHDL 2.8 03/21/2022   Lab Results  Component Value Date   HGBA1C 5.8 (H) 09/07/2022      Assessment & Plan:   Problem List Items Addressed This Visit       Cardiovascular and Mediastinum   Essential hypertension - Primary    BP Readings from Last 1 Encounters:  09/08/22 123/71  Usually well-controlled with HCTZ, but has run out of her meds, had financial constraints Counseled for compliance with the medications Advised DASH diet and moderate exercise/walking as tolerated        Nervous and Auditory   Seizure disorder (HCC)    Her episodes of syncope likely due to seizures, although her recent dizziness is likely presyncope - needs to maintain adequate hydration and avoid skipping meals On  Keppra Followed by Neurology Advised to go to ER if she has any episode of syncope or seizure        Musculoskeletal and Integument   DDD (degenerative disc disease), lumbar    MRI of lumbar spine reviewed - showed DDD of lumbar spine and neural foraminal narrowing, likely contributing to radiating symptoms towards LE She is currently taking Norco as needed for pain, followed by Dr. Luna Glasgow Toradol and Depo-Medrol IM today Referred to spine surgeon      Relevant Orders   Ambulatory referral to Spine Surgery     Other   Moderate episode of recurrent major depressive disorder ([place])    [place] Office Visit from 09/08/2022 in [facility]  PHQ-9 Total Score 24     Uncontrolled, likely due to her medical problems including back and knee pain limiting her functional capacity On Zoloft 25 mg daily, increased dose to 50 mg QD recently Her other chronic medical conditions also contributing to her symptoms      Meds ordered this encounter  Medications   methylPREDNISolone acetate (DEPO-MEDROL) injection 80 mg   ketorolac (TORADOL) injection 60 mg    Follow-up: Return in about 4 months (around  01/08/2023) for HTN and MDD.    Lindell Spar, MD

## 2022-09-08 NOTE — Assessment & Plan Note (Signed)
MRI of lumbar spine reviewed - showed DDD of lumbar spine and neural foraminal narrowing, likely contributing to radiating symptoms towards LE She is currently taking Norco as needed for pain, followed by Dr. Luna Glasgow Toradol and Depo-Medrol IM today Referred to spine surgeon

## 2022-09-08 NOTE — Assessment & Plan Note (Signed)
[place] Office Visit from 09/08/2022 in [facility]  PHQ-9 Total Score 24      Uncontrolled, likely due to her medical problems including back and knee pain limiting her functional capacity On Zoloft 25 mg daily, increased dose to 50 mg QD Her other chronic medical conditions also contributing to her symptoms

## 2022-09-08 NOTE — Patient Instructions (Addendum)
Please continue to take medications as prescribed.  Please continue to follow low salt diet and perform moderate exercise/walking at least 150 mins/week.  You are being referred to Spine surgeon in [place].  [place]  [phone]

## 2022-09-08 NOTE — Assessment & Plan Note (Addendum)
[place] Office Visit from 09/08/2022 in [facility]  PHQ-9 Total Score 24      Uncontrolled, likely due to her medical problems including back and knee pain limiting her functional capacity On Zoloft 25 mg daily, increased dose to 50 mg QD recently Her other chronic medical conditions also contributing to her symptoms

## 2022-09-12 ENCOUNTER — Other Ambulatory Visit: Payer: Self-pay

## 2022-09-12 MED ORDER — HYDROCODONE-ACETAMINOPHEN 5-325 MG PO TABS: ORAL_TABLET | ORAL | 0 refills | Status: DC

## 2022-09-12 NOTE — Telephone Encounter (Signed)
Hydrocodone-Acetaminophen 5/325 MG  Qty 11 Tablets  PATIENT USES [facility] ON [address] 

## 2022-09-19 ENCOUNTER — Other Ambulatory Visit: Payer: Commercial Managed Care - HMO | Admitting: Licensed Clinical Social Worker

## 2022-09-19 ENCOUNTER — Telehealth: Payer: Self-pay | Admitting: Orthopaedic Surgery

## 2022-09-19 MED ORDER — HYDROCODONE-ACETAMINOPHEN 5-325 MG PO TABS: ORAL_TABLET | ORAL | 0 refills | Status: DC

## 2022-09-19 NOTE — Telephone Encounter (Signed)
Dr. Sanjuan Dame patient - patient lvm stating it was time for her medication.  She'd like a refill on Hydrocodone 5-325 to be sent to [facility] on [address].  [phone]

## 2022-09-19 NOTE — Patient Outreach (Signed)
Medicaid Managed Care Social Work Note  09/19/2022 Name:  Judy Cross MRN:  [id number] DOB:  09/06/1965  Judy Cross is an 57 y.o. year old female who is a primary patient of Anabel HalonPatel, Rutwik K, MD.  The Medicaid Managed Care Coordination team was consulted for assistance with:  Mental Health Counseling and Resources  Ms. Judy MagesHairston was given information about Medicaid Managed Care Coordination team services today. Judy Cross Patient agreed to services and verbal consent obtained.  Engaged with patient  for by telephone forfollow up visit in response to referral for case management and/or care coordination services.   Assessments/Interventions:  Review of past medical history, allergies, medications, health status, including review of consultants reports, laboratory and other test data, was performed as part of comprehensive evaluation and provision of chronic care management services.  SDOH: (Social Determinant of Health) assessments and interventions performed: SDOH Interventions    Flowsheet Row Patient Outreach Telephone from 09/07/2022 in [place] POPULATION HEALTH DEPARTMENT Patient Outreach Telephone from 08/23/2022 in [place] POPULATION HEALTH DEPARTMENT Patient Outreach Telephone from 08/21/2022 in [place] POPULATION HEALTH DEPARTMENT Patient Outreach Telephone from 08/10/2022 in [place] POPULATION HEALTH DEPARTMENT Patient Outreach Telephone from 08/03/2022 in [place] POPULATION HEALTH DEPARTMENT Patient Outreach Telephone from 07/10/2022 in [place] POPULATION HEALTH DEPARTMENT  SDOH Interventions        Food Insecurity Interventions -- -- Intervention Not Indicated -- -- --  Housing Interventions -- -- --  [Currently staying with her son. Waiting for disability determination] -- -- --  Transportation Interventions -- -- -- Intervention Not Indicated -- --  Depression Interventions/Treatment  -- -- -- -- Counseling --  Stress Interventions Offered [employer] Resources, Provide Counseling Offered [employer] Wellness Resources, Provide Counseling -- -- Offered [employer] Wellness Resources, Provide Counseling Offered [employer] Wellness Resources, Provide Counseling       Advanced Directives Status:  See Care Plan for related entries.  Care Plan                 Allergies  Allergen Reactions   Lisinopril Swelling    swelling to entire mouth.   Penicillins Rash    Has patient had a PCN reaction causing immediate rash, facial/tongue/throat swelling, SOB or lightheadedness with hypotension: no - next day Has patient had a PCN reaction causing severe rash involving mucus membranes or skin necrosis: No Has patient had a PCN reaction that required hospitalization No Has patient had a PCN reaction occurring within the last 10 years: Yes If all of the above answers are "NO", then may proceed with Cephalosporin use.    Tramadol Other (See Comments)    dizziness    Medications Reviewed Today     Reviewed by Anabel HalonPatel, Rutwik K, MD (Physician) on 09/08/22 at 0909  Med List Status: <None>   Medication Order Taking? Sig Documenting Provider Last Dose Status Informant  albuterol (VENTOLIN HFA) 108 (90 Base) MCG/ACT inhaler [id number] No Inhale 2 puffs into the lungs every 6 (six) hours as needed for wheezing or shortness of breath.  Patient not taking: Reported on 08/21/2022   Anabel HalonPatel, Rutwik K, MD Not Taking Active   hydrochlorothiazide (HYDRODIURIL) 25 MG tablet [id number] No Take 1 tablet (25 mg total) by mouth daily. Anabel HalonPatel, Rutwik K, MD Taking Active   HYDROcodone-acetaminophen (NORCO/VICODIN) 5-325 MG tablet [id number]  One tablet every six hours for pain.  Limit 5 days. Darreld McleanKeeling, Wayne, MD  Active   levETIRAcetam (KEPPRA) 750 MG tablet [id number] No Take 1  tablet (750 mg total) by mouth 2 (two) times daily. Windell Norfolk, MD Taking Active            Med Note (ROBB, MELANIE A   Mon Aug 21, 2022  9:09 AM)    levocetirizine (XYZAL) 5 MG tablet  [id number] No Take 1 tablet (5 mg total) by mouth every evening.  Patient not taking: Reported on 08/10/2022   Anabel Halon, MD Not Taking Active   omeprazole (PRILOSEC) 20 MG capsule [id number] No Take 1 capsule (20 mg total) by mouth daily.  Patient not taking: Reported on 08/10/2022   Anabel Halon, MD Not Taking Active   sertraline (ZOLOFT) 50 MG tablet [id number] No Take 1 tablet (50 mg total) by mouth daily. Anabel Halon, MD Taking Active   Vitamin D, Ergocalciferol, (DRISDOL) 1.25 MG (50000 UNIT) CAPS capsule [id number] No Take 1 capsule (50,000 Units total) by mouth every 7 (seven) days.  Patient not taking: Reported on 08/21/2022   Anabel Halon, MD Not Taking Active             Patient Active Problem List   Diagnosis Date Noted   Moderate episode of recurrent major depressive disorder 09/08/2022   DDD (degenerative disc disease), lumbar 09/08/2022   Encounter for general adult medical examination with abnormal findings 07/27/2022   Hypokalemia 03/22/2022   Vitamin D deficiency 03/22/2022   Fall 03/22/2022   Tobacco abuse 01/06/2022   Spinal stenosis of lumbar region 06/17/2021   Mass of lower outer quadrant of left breast 05/09/2021   Arthritis of left knee 12/02/2020   Chronic left shoulder pain 12/02/2020   Allergic sinusitis 12/02/2020   Mass of left finger    Anxiety 06/12/2019   Seizure disorder 06/12/2019   Environmental and seasonal allergies 06/12/2019   Abnormal Pap smear of cervix 05/27/2019   Chronic midline low back pain with left-sided sciatica 01/12/2017   Essential hypertension 12/07/2016   Mild intermittent asthma without complication 12/07/2016    Conditions to be addressed/monitored per PCP order:  Anxiety and Depression  Care Plan : LCSW Plan of Care  Updates made by Gustavus Bryant, LCSW since 09/19/2022 12:00 AM     Problem: Coping Skills for symptoms of Anxiety      Goal: Coping Skills Enhanced by connecting for ongoing therapy    Start Date: 07/20/2021  Recent Progress: On track  Priority: High  Note:   Current Barriers:  Disease Management support and education needs related to Anxiety with Panic Symptoms, and Stress at home  CSW Clinical Goal(s):  Patient  will work with [facility] LCSW to address needs related to managing symptoms of anxiety  through collaboration with Clinical Social Worker, provider, and care team Patient will gain counseling and psychiatry services to help increase mental health support  Interventions: Inter-disciplinary care team collaboration (see longitudinal plan of care) Evaluation of current treatment plan related to self management and patient's adherence to plan as established by provider Review resources, discussed options and provided patient information about  Department of Social Services ( currently receiving services ) Transportation provided by insurance provider (currently using) Discussed therapy with options based on need and insurance  BSW completed a telephone outreach with patient, she states she is not working and does not have any income. Patient states she is in need of resources for everything. BSW will mail patient a list of resources.  Mental Health:  (Status: New goal.) Evaluation of current treatment plan related to Anxiety with Panic  Symptoms, Solution-Focused Strategies employed:  Mindfulness or Relaxation training provided Active listening / Reflection utilized  Emotional Support Provided Quality of sleep assessed & Sleep Hygiene techniques promoted  Provided EMMI education information on relieving stress, and insomnia getting a good night's rest Suicidal Ideation/Homicidal Ideation assessed: denied Collaborated with [facility] in [place], [place] [phone]. Made referral to [facility]( patient has appointment 07/22/2021. Patient was unable to attend this appointment due to a family emergency. She has not rescheduled this  appointment yet but will do so today. 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 Patient reports that she has already completed her UDS, TB test and Criminal Background. Update New mental health referrals placed for psychiatry and counseling to [facility] [place]. 06/16/22 Update- Patient reports that she has not received a call from [facility] [place]. One of the two referrals that [facility] LCSW made is denied. Patient is agreeable to contact agency next week (they close early on Fridays) to make sure she gets scheduled for her initial sessions. Emotional support provided. Mental Health resource contact information provided as well. 06/26/22 update- Patient reports that she has not heard from [facility] yet but will contact them today as they are closed today for the holiday. [facility] provided brief coping skill education for stress management. [facility] LCSW will follow up within 2 weeks. Patient reports that she successfully retrieved email that [facility] LCSW sent during last session. 07/10/22 update- [facility] LCSW was informed by patient that her boyfriend of 2 months passed away over the weekend which has increased her symptoms of grief and depression. Grief support provided to patient and she was receptive to this. [facility] LCSW made call to [facility] [place] and spoke to staff. They will contact patient directly today to schedule her counseling appointment but her psychiatry referral will have to be reviewed first before scheduling even though that referral was made over a month ago. [facility] LCSW will follow up next month. LCSW 08/03/22 update- Patient is not picking up her depression medication or any other medication due to financial issues. [facility] LCSW will update Pharmacist that is involved in her care. Emotional support and crisis resource education provided. Patient was  encouraged to consider [facility] walk in clinic until her appointment next month at [facility] [place], [place].  Patient reports that she is implementing appropriate boundaries to implement negative people, places and things. Patient works on her breathing exercises daily.  Patient denies any SI/HI. Patient reports that she continues to experience signs of grief but was able to turn it around and even let a balloon go in remembrance of her father. Positive reinforcement provided.   LCSW provided education on relaxation techniques such as meditation, deep breathing, massage, grounding exercsies or yoga that can activate the body's relaxation response and ease symptoms of stress and anxiety. LCSW ask that when pt is struggling with difficult emotions and racing thoughts that they start this relaxation response process. LCSW provided extensive education on healthy coping skills for anxiety. SW used active and reflective listening, validated patient's feelings/concerns, and provided emotional support. LCSW 08/23/22 update- Patient was reminded of upcoming counseling appointment and she confirms that she will be able to attend this appointment. LCSW 09/07/22 update- Patient was a NO SHOW for scheduled counseling appointment. Patient reports that "I'm not as  stressed now that I recently got approved for disability but I would still like to gain therapy." Patient was advised that she must be the one to contact [facility] to reschedule appointment because agency prefers to talk to patient directly. Patient is agreeable to contact agency today. 09/19/22 update- Patient reports that she contacted agency but was unable to reschedule her counseling appointment but she will try again once she returns back home as she is currently out doing errands. [facility] LCSW will close referral soon as patient has been provided extension mental health resource education and stress management coping  skill education.   Patient Self-Care Activities: Practice relaxed breathing 3 times a day     24- Hour Availability:    [facility]  [address] [place], [place] [phone] Crisis [phone]   [facility] [phone]   [place] [facility]  [phone]    [facility]  [phone] (after hours)   Therapeutic Alternative/Mobile Crisis   [phone]   [place] National Suicide Hotline  (734)614-4054 (TALK) OR 988   Call 911 or go to emergency room   [facility]  [phone]);  [place] and [employer]  [phone]); [place], [place], [place], [place], [place], [place], [place]        10 LITTLE Things To Do When You're Feeling Too Down To Do Anything  Take a shower. Even if you plan to stay in all day long and not see a soul, take a shower. It takes the most effort to hop in to the shower but once you do, you'll feel immediate results. It will wake you up and you'll be feeling much fresher (and cleaner too).  Brush and floss your teeth. Give your teeth a good brushing with a floss finish. It's a small task but it feels so good and you can check 'taking care of your health' off the list of things to do.  Do something small on your list. Most of [place] have some small thing we would like to get done (load of laundry, sew a button, email a friend). Doing one of these things will make you feel like you've accomplished something.  Drink water. Drinking water is easy right? It's also really beneficial for your health so keep a glass beside you all day and take sips often. It gives you energy and prevents you from boredom eating.  Do some floor exercises. The last thing you want to do is exercise but it might be just the thing you need the most. Keep it simple and do exercises that involve sitting or laying on the floor. Even the smallest of exercises  release chemicals in the brain that make you feel good. Yoga stretches or core exercises are going to make you feel good with minimal effort.  Make your bed. Making your bed takes a few minutes but it's productive and you'll feel relieved when it's done. An unmade bed is a huge visual reminder that you're having an unproductive day. Do it and consider it your housework for the day.  Put on some nice clothes. Take the sweatpants off even if you don't plan to go anywhere. Put on clothes that make you feel good. Take a look in the mirror so your brain recognizes the sweatpants have been replaced with clothes that make you look great. It's an instant confidence booster.  Wash the dishes. A pile of dirty dishes in  the sink is a reflection of your mood. It's possible that if you wash up the dishes, your mood will follow suit. It's worth a try.  Cook a real meal. If you have the luxury to have a "do nothing" day, you have time to make a real meal for yourself. Make a meal that you love to eat. The process is good to get you out of the funk and the food will ensure you have more energy for tomorrow.  Write out your thoughts by hand. When you hand write, you stimulate your brain to focus on the moment that you're in so make yourself comfortable and write whatever comes into your mind. Put those thoughts out on paper so they stop spinning around in your head. Those thoughts might be the very thing holding you down.      Follow up:  Patient agrees to Care Plan and Follow-up.  Plan: The Managed Medicaid care management team will reach out to the patient again over the next 30 days.  Date/time of next scheduled Social Work care management/care coordination outreach:  10/03/22 at 145 pm  Dickie La, BSW, MSW, [employer] Managed Medicaid LCSW [facility]  [employer] [email or web address] Phone: [phone]

## 2022-09-19 NOTE — Patient Instructions (Signed)
Visit Information  Judy Cross was given information about Medicaid Managed Care team care coordination services as a part of their [facility] Medicaid benefit. Judy Cross verbally consented to engagement with the [facility] Managed Care team.   If you are experiencing a medical emergency, please call 911 or report to your local emergency department or urgent care.   If you have a non-emergency medical problem during routine business hours, please contact your provider's office and ask to speak with a nurse.   For questions related to your [facility], please call: [phone] or visit the homepage here: [email or web address]  If you would like to schedule transportation through your [facility], please call the following number at least 2 days in advance of your appointment: [phone]   Rides for urgent appointments can also be made after hours by calling Member Services.  Call the Behavioral Health Crisis Line at [phone], at any time, 24 hours a day, 7 days a week. If you are in danger or need immediate medical attention call 911.  If you would like help to quit smoking, call 1-800-QUIT-NOW (769-399-7718) OR Espaol: 1-855-Djelo-Ya (2-423-536-1443) o para ms informacin haga clic aqu or Text READY to 154-008 to register via text  Following is a copy of your plan of care:  Care Plan : LCSW Plan of Care  Updates made by Gustavus Bryant, LCSW since 09/19/2022 12:00 AM     Problem: Coping Skills for symptoms of Anxiety      Goal: Coping Skills Enhanced by connecting for ongoing therapy   Start Date: 07/20/2021  Recent Progress: On track  Priority: High  Note:   Current Barriers:  Disease Management support and education needs related to Anxiety with Panic Symptoms, and Stress at home  CSW Clinical Goal(s):  Patient  will work with [facility] LCSW to  address needs related to managing symptoms of anxiety  through collaboration with Clinical Social Worker, provider, and care team Patient will gain counseling and psychiatry services to help increase mental health support  10 LITTLE Things To Do When You're Feeling Too Down To Do Anything  Take a shower. Even if you plan to stay in all day long and not see a soul, take a shower. It takes the most effort to hop in to the shower but once you do, you'll feel immediate results. It will wake you up and you'll be feeling much fresher (and cleaner too).  Brush and floss your teeth. Give your teeth a good brushing with a floss finish. It's a small task but it feels so good and you can check 'taking care of your health' off the list of things to do.  Do something small on your list. Most of [place] have some small thing we would like to get done (load of laundry, sew a button, email a friend). Doing one of these things will make you feel like you've accomplished something.  Drink water. Drinking water is easy right? It's also really beneficial for your health so keep a glass beside you all day and take sips often. It gives you energy and prevents you from boredom eating.  Do some floor exercises. The last thing you want to do is exercise but it might be just the thing you need the most. Keep it simple and do exercises that involve sitting or laying on the floor. Even the smallest of exercises release chemicals in the brain that make you feel good. Yoga  stretches or core exercises are going to make you feel good with minimal effort.  Make your bed. Making your bed takes a few minutes but it's productive and you'll feel relieved when it's done. An unmade bed is a huge visual reminder that you're having an unproductive day. Do it and consider it your housework for the day.  Put on some nice clothes. Take the sweatpants off even if you don't plan to go anywhere. Put on clothes that make you feel good. Take a  look in the mirror so your brain recognizes the sweatpants have been replaced with clothes that make you look great. It's an instant confidence booster.  Wash the dishes. A pile of dirty dishes in the sink is a reflection of your mood. It's possible that if you wash up the dishes, your mood will follow suit. It's worth a try.  Cook a real meal. If you have the luxury to have a "do nothing" day, you have time to make a real meal for yourself. Make a meal that you love to eat. The process is good to get you out of the funk and the food will ensure you have more energy for tomorrow.  Write out your thoughts by hand. When you hand write, you stimulate your brain to focus on the moment that you're in so make yourself comfortable and write whatever comes into your mind. Put those thoughts out on paper so they stop spinning around in your head. Those thoughts might be the very thing holding you down.

## 2022-09-22 ENCOUNTER — Ambulatory Visit: Payer: Commercial Managed Care - HMO | Admitting: Adult Health

## 2022-09-22 ENCOUNTER — Other Ambulatory Visit: Payer: Commercial Managed Care - HMO | Admitting: *Deleted

## 2022-09-22 NOTE — Patient Outreach (Signed)
  Medicaid Managed Care   Unsuccessful Attempt Note   09/22/2022 Name: Judy Cross MRN: [id number] DOB: May 08, 1966  Referred by: Anabel Halon, MD Reason for referral : High Risk Managed Medicaid (Unsuccessful RNCM follow up telephone outreach)   An unsuccessful telephone outreach was attempted today. The patient was referred to the case management team for assistance with care management and care coordination.    Follow Up Plan: A HIPAA compliant phone message was left for the patient providing contact information and requesting a return call. and The Managed Medicaid care management team will reach out to the patient again over the next 7 days.    Estanislado Emms RN, BSN [place]  Managed [facility] RN Care Coordinator [phone]

## 2022-09-22 NOTE — Patient Instructions (Signed)
Visit Information  Ms. Cindie SHIRLINE COELHO  - as a part of your Medicaid benefit, you are eligible for care management and care coordination services at no cost or copay. I was unable to reach you by phone today but would be happy to help you with your health related needs. Please feel free to call me @ [phone].   A member of the Managed Medicaid care management team will reach out to you again over the next 7 days.   Estanislado Emms RN, BSN   Managed [facility] RN Care Coordinator [phone]

## 2022-09-26 ENCOUNTER — Telehealth: Payer: Self-pay | Admitting: Orthopaedic Surgery

## 2022-09-26 ENCOUNTER — Ambulatory Visit (INDEPENDENT_AMBULATORY_CARE_PROVIDER_SITE_OTHER): Payer: Commercial Managed Care - HMO | Admitting: Adult Health

## 2022-09-26 ENCOUNTER — Encounter: Payer: Self-pay | Admitting: Adult Health

## 2022-09-26 VITALS — BP 113/80 | HR 74 | Ht 61.5 in | Wt 127.5 lb

## 2022-09-26 DIAGNOSIS — N644 Mastodynia: Secondary | ICD-10-CM | POA: Insufficient documentation

## 2022-09-26 MED ORDER — HYDROCODONE-ACETAMINOPHEN 5-325 MG PO TABS: ORAL_TABLET | ORAL | 0 refills | Status: DC

## 2022-09-26 NOTE — Telephone Encounter (Signed)
Dr. Sanjuan Dame pt - pt lvm stating she wanted to let us know it's time for her refill on Hydrocodone 5-325 to [facility] on [address]

## 2022-09-26 NOTE — Progress Notes (Signed)
  Subjective:     Patient ID: Judy Cross, female   DOB: 1965-12-15, 57 y.o.   MRN: [id number]  HPI Judy Cross is a 57 year old black female, single, PM in complaining of left breast irritation and some pain but better now.  Last pap was negative HPV, NILM 10/12/20.  PCP is Dr Allena Katz.  Review of Systems Has left breast irritation and pain, but is better now Reviewed past medical,surgical, social and family history. Reviewed medications and allergies.     Objective:   Physical Exam BP 113/80 (BP Location: Left Arm, Patient Position: Sitting, Cuff Size: Normal)   Pulse 74   Ht 5' 1.5" (1.562 m)   Wt 127 lb 8 oz (57.8 kg)   BMI 23.70 kg/m   Skin warm and dry,  Breasts:no dominate palpable mass, retraction or nipple discharge, no skin changes on left, has some tenderness 3-6 0' clock on left   Up[place] - 09/26/22 1110       Pregnancy Intention Screening   Does the patient want to become pregnant in the next year? No    Does the patient's partner want to become pregnant in the next year? No    Would the patient like to discuss contraceptive options today? No      Contraception Wrap Up   Current Method Female Sterilization    End Method Female Sterilization    Contraception Counseling Provided No                Assessment:     1. Breast pain, left She will get 6 month follow up diagnostic mammogram and [place] in June - bilateral diagnostic mammogram and left breast US scheduled for 12/05/22 at 2 pm at [facility]:     Follow up prm

## 2022-09-29 ENCOUNTER — Telehealth: Payer: Self-pay

## 2022-09-29 NOTE — Telephone Encounter (Signed)
..   Medicaid Managed Care   Unsuccessful Outreach Note  09/29/2022 Name: Judy Cross MRN: [id number] DOB: [birth year]  Referred by: Anabel Halon, MD Reason for referral : Appointment (I called the patient today to get her missed phone appointment with the MM RNCM rescheduled. I left my name and number on her voicemail.)   A second unsuccessful telephone outreach was attempted today. The patient was referred to the case management team for assistance with care management and care coordination.   Follow Up Plan: A HIPAA compliant phone message was left for the patient providing contact information and requesting a return call.  The care management team will reach out to the patient again over the next 7 days.    Weston Settle Care Guide  [facility] Managed  [facility]  [phone]

## 2022-10-03 ENCOUNTER — Ambulatory Visit: Payer: Commercial Managed Care - HMO | Admitting: Licensed Clinical Social Worker

## 2022-10-03 ENCOUNTER — Telehealth: Payer: Self-pay | Admitting: Orthopaedic Surgery

## 2022-10-03 MED ORDER — HYDROCODONE-ACETAMINOPHEN 5-325 MG PO TABS: ORAL_TABLET | ORAL | 0 refills | Status: DC

## 2022-10-03 NOTE — Telephone Encounter (Signed)
Dr. Sanjuan Dame pt - pt called and lvm stating it's time for her refill on her Hydrocodone 5-325 to be sent to [facility] on [address].

## 2022-10-05 ENCOUNTER — Other Ambulatory Visit: Payer: Commercial Managed Care - HMO | Admitting: *Deleted

## 2022-10-05 ENCOUNTER — Encounter: Payer: Self-pay | Admitting: *Deleted

## 2022-10-05 NOTE — Patient Outreach (Signed)
Medicaid Managed Care   Nurse Care Manager Note  10/05/2022 Name:  Judy Cross MRN:  [id number] DOB:  [birth year]  Judy Cross is an 57 y.o. year old female who is a primary patient of Anabel Halon, MD.  The [facility] Managed Care Coordination team was consulted for assistance with:    HTN seizure  Ms. Judy Cross was given information about Medicaid Managed Care Coordination team services today. Judy Cross Patient agreed to services and verbal consent obtained.  Engaged with patient by telephone for follow up visit in response to provider referral for case management and/or care coordination services.   Assessments/Interventions:  Review of past medical history, allergies, medications, health status, including review of consultants reports, laboratory and other test data, was performed as part of comprehensive evaluation and provision of chronic care management services.  SDOH (Social Determinants of Health) assessments and interventions performed: SDOH Interventions    Flowsheet Row Patient Outreach Telephone from 10/05/2022 in [place] POPULATION HEALTH DEPARTMENT Patient Outreach Telephone from 09/07/2022 in [place] POPULATION HEALTH DEPARTMENT Patient Outreach Telephone from 08/23/2022 in [place] POPULATION HEALTH DEPARTMENT Patient Outreach Telephone from 08/21/2022 in [place] POPULATION HEALTH DEPARTMENT Patient Outreach Telephone from 08/10/2022 in [place] POPULATION HEALTH DEPARTMENT Patient Outreach Telephone from 08/03/2022 in  POPULATION HEALTH DEPARTMENT  SDOH Interventions        Food Insecurity Interventions -- -- -- Intervention Not Indicated -- --  Housing Interventions -- -- -- --  [Currently staying with her son. Waiting for disability determination] -- --  Transportation Interventions Payor Benefit -- -- -- Intervention Not Indicated --  Depression Interventions/Treatment  -- -- -- -- -- Counseling  Stress Interventions -- [employer], Provide Counseling Offered [employer], Provide Counseling -- -- [employer], Provide Counseling       Care Plan  Allergies  Allergen Reactions   Lisinopril Swelling    swelling to entire mouth.   Penicillins Rash    Has patient had a PCN reaction causing immediate rash, facial/tongue/throat swelling, SOB or lightheadedness with hypotension: no - next day Has patient had a PCN reaction causing severe rash involving mucus membranes or skin necrosis: No Has patient had a PCN reaction that required hospitalization No Has patient had a PCN reaction occurring within the last 10 years: Yes If all of the above answers are "NO", then may proceed with Cephalosporin use.    Tramadol Other (See Comments)    dizziness    Medications Reviewed Today     Reviewed by Heidi Dach, RN (Registered Nurse) on 10/05/22 at 1633  Med List Status: <None>   Medication Order Taking? Sig Documenting Provider Last Dose Status Informant  hydrochlorothiazide (HYDRODIURIL) 25 MG tablet [id number] Yes Take 1 tablet (25 mg total) by mouth daily. Anabel Halon, MD Taking Active   HYDROcodone-acetaminophen (NORCO/VICODIN) 5-325 MG tablet [id number] Yes One tablet every six hours for pain.  Limit 5 days. Darreld Mclean, MD Taking Active   levETIRAcetam (KEPPRA) 750 MG tablet [id number] Yes Take 1 tablet (750 mg total) by mouth 2 (two) times daily. Windell Norfolk, MD Taking Active            Med Note (Rithwik Schmieg A   Mon Aug 21, 2022  9:09 AM)    sertraline (ZOLOFT) 50 MG tablet [id number] Yes Take 1 tablet (50 mg total) by mouth daily. Anabel Halon, MD Taking Active  Patient Active Problem List   Diagnosis Date Noted   Breast pain, left 09/26/2022   Moderate episode of recurrent major depressive disorder 09/08/2022   DDD (degenerative disc disease), lumbar 09/08/2022   Encounter for general adult medical examination  with abnormal findings 07/27/2022   Hypokalemia 03/22/2022   Vitamin D deficiency 03/22/2022   Fall 03/22/2022   Tobacco abuse 01/06/2022   Spinal stenosis of lumbar region 06/17/2021   Mass of lower outer quadrant of left breast 05/09/2021   Arthritis of left knee 12/02/2020   Chronic left shoulder pain 12/02/2020   Allergic sinusitis 12/02/2020   Mass of left finger    Anxiety 06/12/2019   Seizure disorder 06/12/2019   Environmental and seasonal allergies 06/12/2019   Abnormal Pap smear of cervix 05/27/2019   Chronic midline low back pain with left-sided sciatica 01/12/2017   Essential hypertension 12/07/2016   Mild intermittent asthma without complication 12/07/2016    Conditions to be addressed/monitored per PCP order:  HTN and seizure  Care Plan : RN Care Manager Plan of Care  Updates made by Heidi Dach, RN since 10/05/2022 12:00 AM     Problem: Health Management needs related to HTN and Seizure disorder      Long-Range Goal: Development of Plan of Care to address Health Management needs related to HTN and Seizure disorder   Start Date: 08/10/2022  Expected End Date: 12/08/2022  Note:   Current Barriers:  Chronic Disease Management support and education needs related to HTN and Seizure Disorder Financial Constraints. Patient now has [place] and Managed Medicaid and is now receiving SSI  RNCM Clinical Goal(s):  Patient will verbalize understanding of plan for management of HTN and Seizure Disorder as evidenced by patient reports take all medications exactly as prescribed and will call provider for medication related questions as evidenced by patient reports and EMR documentation    attend all scheduled medical appointments: 10/17/22 with [facility] on 12/05/22 as evidenced by provider documentation in EMR        through collaboration with RN Care manager, provider, and care team.   Interventions: Inter-disciplinary care team collaboration (see  longitudinal plan of care) Evaluation of current treatment plan related to  self management and patient's adherence to plan as established by provider Verified that patient has transportation to upcoming appointments Provided therapeutic listening-patient reports everything is good, she is receiving SSI and working on finding her own housing   Seizure Disorder  (Status: Goal on Track (progressing): YES.) Long Term Goal  Evaluation of current treatment plan related to  seizure disorder ,  self-management and patient's adherence to plan as established by provider. Discussed plans with patient for ongoing care management follow up and provided patient with direct contact information for care management team Reviewed medications with patient and discussed the importance of taking prescribed medications as directed; Reviewed scheduled/upcoming provider appointments including 10/17/22 with [facility] on 12/05/22; Assessed social determinant of health barriers;  Verified patient has Keppra and is taking twice daily  Hypertension: (Status: Goal on Track (progressing): YES.) Long Term Goal  Last practice recorded BP readings:  BP Readings from Last 3 Encounters:  09/26/22 113/80  09/08/22 123/71  07/27/22 (!) 148/84  Most recent eGFR/CrCl:  Lab Results  Component Value Date   EGFR 99 09/07/2022    No components found for: "CRCL"  Evaluation of current treatment plan related to hypertension self management and patient's adherence to plan as established by provider;  Provided education to patient re: stroke prevention, s/s of heart attack and stroke; Reviewed prescribed diet heart healthy Reviewed medications with patient and discussed importance of compliance;  Discussed plans with patient for ongoing care management follow up and provided patient with direct contact information for care management team; Reviewed scheduled/upcoming provider appointments including:  Assessed  social determinant of health barriers;    Patient Goals/Self-Care Activities: Take medications as prescribed   Attend all scheduled provider appointments Call provider office for new concerns or questions  keep all doctor appointments take medications for blood pressure exactly as prescribed eat more whole grains, fruits and vegetables, lean meats and healthy fats Decrease prepackaged foods which are high in sodium       Follow Up:  Patient agrees to Care Plan and Follow-up.  Plan: The Managed Medicaid care management team will reach out to the patient again over the next 30 days.  Date/time of next scheduled RN care management/care coordination outreach:  11/13/22 @ 2:30pm  Estanislado Emms RN, BSN [place]  Managed [facility] RN Care Coordinator [phone]

## 2022-10-05 NOTE — Patient Instructions (Signed)
Visit Information  Ms. Mehler was given information about Medicaid Managed Care team care coordination services as a part of their [facility] Community Plan Medicaid benefit. Zury ZOSIA LUCCHESE verbally consented to engagement with the [facility] Managed Care team.   If you are experiencing a medical emergency, please call 911 or report to your local emergency department or urgent care.   If you have a non-emergency medical problem during routine business hours, please contact your provider's office and ask to speak with a nurse.   For questions related to your [facility], please call: [phone] or visit the homepage here: [email or web address]  If you would like to schedule transportation through your [facility], please call the following number at least 2 days in advance of your appointment: [phone]   Rides for urgent appointments can also be made after hours by calling Member Services.  Call the Behavioral Health Crisis Line at [phone], at any time, 24 hours a day, 7 days a week. If you are in danger or need immediate medical attention call 911.  If you would like help to quit smoking, call 1-800-QUIT-NOW (620-052-9600) OR Espaol: 1-855-Djelo-Ya (1-324-401-0272) o para ms informacin haga clic aqu or Text READY to 536-644 to register via text  Ms. Furtick,   Please see education materials related to seizures and HTN provided as print materials.   The patient verbalized understanding of instructions, educational materials, and care plan provided today and agreed to receive a mailed copy of patient instructions, educational materials, and care plan.   Telephone follow up appointment with Managed Medicaid care management team member scheduled for:11/13/22 @ 2:30pm  Estanislado Emms RN, BSN [place]  Managed [facility] RN Care Coordinator [phone]   Following  is a copy of your plan of care:  Care Plan : RN Care Manager Plan of Care  Updates made by Heidi Dach, RN since 10/05/2022 12:00 AM     Problem: Health Management needs related to HTN and Seizure disorder      Long-Range Goal: Development of Plan of Care to address Health Management needs related to HTN and Seizure disorder   Start Date: 08/10/2022  Expected End Date: 12/08/2022  Note:   Current Barriers:  Chronic Disease Management support and education needs related to HTN and Seizure Disorder Financial Constraints. Patient now has [place] and Managed Medicaid and is now receiving SSI  RNCM Clinical Goal(s):  Patient will verbalize understanding of plan for management of HTN and Seizure Disorder as evidenced by patient reports take all medications exactly as prescribed and will call provider for medication related questions as evidenced by patient reports and EMR documentation    attend all scheduled medical appointments: 10/17/22 with [facility] on 12/05/22 as evidenced by provider documentation in EMR        through collaboration with RN Care manager, provider, and care team.   Interventions: Inter-disciplinary care team collaboration (see longitudinal plan of care) Evaluation of current treatment plan related to  self management and patient's adherence to plan as established by provider Verified that patient has transportation to upcoming appointments Provided therapeutic listening-patient reports everything is good, she is receiving SSI and working on finding her own housing   Seizure Disorder  (Status: Goal on Track (progressing): YES.) Long Term Goal  Evaluation of current treatment plan related to  seizure disorder ,  self-management and patient's adherence to plan as established by provider. Discussed plans with patient for ongoing care  management follow up and provided patient with direct contact information for care management team Reviewed medications  with patient and discussed the importance of taking prescribed medications as directed; Reviewed scheduled/upcoming provider appointments including 10/17/22 with [facility] on 12/05/22; Assessed social determinant of health barriers;  Verified patient has Keppra and is taking twice daily  Hypertension: (Status: Goal on Track (progressing): YES.) Long Term Goal  Last practice recorded BP readings:  BP Readings from Last 3 Encounters:  09/26/22 113/80  09/08/22 123/71  07/27/22 (!) 148/84  Most recent eGFR/CrCl:  Lab Results  Component Value Date   EGFR 99 09/07/2022    No components found for: "CRCL"  Evaluation of current treatment plan related to hypertension self management and patient's adherence to plan as established by provider;   Provided education to patient re: stroke prevention, s/s of heart attack and stroke; Reviewed prescribed diet heart healthy Reviewed medications with patient and discussed importance of compliance;  Discussed plans with patient for ongoing care management follow up and provided patient with direct contact information for care management team; Reviewed scheduled/upcoming provider appointments including:  Assessed social determinant of health barriers;    Patient Goals/Self-Care Activities: Take medications as prescribed   Attend all scheduled provider appointments Call provider office for new concerns or questions  keep all doctor appointments take medications for blood pressure exactly as prescribed eat more whole grains, fruits and vegetables, lean meats and healthy fats Decrease prepackaged foods which are high in sodium

## 2022-10-10 ENCOUNTER — Telehealth: Payer: Self-pay | Admitting: Orthopaedic Surgery

## 2022-10-10 MED ORDER — HYDROCODONE-ACETAMINOPHEN 5-325 MG PO TABS: ORAL_TABLET | ORAL | 0 refills | Status: DC

## 2022-10-10 NOTE — Telephone Encounter (Signed)
Dr. Sanjuan Dame pt, Dr. Hilda Lias is out of the office - pt lvm requesting a refill on Hydrocodone 5-325, 11 quantity, as directed to be sent to [facility] on [address]

## 2022-10-17 ENCOUNTER — Telehealth: Payer: Self-pay | Admitting: Orthopaedic Surgery

## 2022-10-17 MED ORDER — HYDROCODONE-ACETAMINOPHEN 5-325 MG PO TABS: ORAL_TABLET | ORAL | 0 refills | Status: DC

## 2022-10-17 NOTE — Telephone Encounter (Signed)
Dr. Sanjuan Dame pt - pt lvm stating it was time for her refill, Hydrocodone 5-325 to be sent to [facility] on [address]

## 2022-10-24 ENCOUNTER — Telehealth: Payer: Self-pay | Admitting: Orthopaedic Surgery

## 2022-10-24 DIAGNOSIS — M5416 Radiculopathy, lumbar region: Secondary | ICD-10-CM | POA: Diagnosis not present

## 2022-10-24 MED ORDER — HYDROCODONE-ACETAMINOPHEN 5-325 MG PO TABS: ORAL_TABLET | ORAL | 0 refills | Status: DC

## 2022-10-24 NOTE — Telephone Encounter (Signed)
Dr. Sanjuan Dame patient - spoke w/the patient, she stated it's time for her refill on Hydrocodone, she'd like it sent to [facility] on [address].  She also stated she is seeing the neurosurgeon today.

## 2022-10-31 ENCOUNTER — Telehealth: Payer: Self-pay | Admitting: Orthopaedic Surgery

## 2022-10-31 MED ORDER — HYDROCODONE-ACETAMINOPHEN 5-325 MG PO TABS: ORAL_TABLET | ORAL | 0 refills | Status: DC

## 2022-10-31 NOTE — Telephone Encounter (Signed)
Dr. Sanjuan Dame pt - pt lvm requesting a refill for Hydrocodone to be sent to [facility] on [address]

## 2022-11-07 ENCOUNTER — Telehealth: Payer: Self-pay | Admitting: Orthopaedic Surgery

## 2022-11-07 MED ORDER — HYDROCODONE-ACETAMINOPHEN 5-325 MG PO TABS: ORAL_TABLET | ORAL | 0 refills | Status: DC

## 2022-11-07 NOTE — Telephone Encounter (Signed)
Dr. Sanjuan Dame pt - pt lvm stating it was time for her refill on Hydrocodone 5-325, to be sent to [facility] on [address]

## 2022-11-13 ENCOUNTER — Other Ambulatory Visit: Payer: Commercial Managed Care - HMO | Admitting: *Deleted

## 2022-11-13 NOTE — Patient Outreach (Signed)
  Medicaid Managed Care   Unsuccessful Attempt Note   11/13/2022 Name: Judy Cross MRN: [id number] DOB: Jun 10, 1966  Referred by: Anabel Halon, MD Reason for referral : High Risk Managed Medicaid (Unsuccessful RNCM follow up telephone outreach)   An unsuccessful telephone outreach was attempted today. The patient was referred to the case management team for assistance with care management and care coordination.    Follow Up Plan: A HIPAA compliant phone message was left for the patient providing contact information and requesting a return call. and The Managed Medicaid care management team will reach out to the patient again over the next 7 days.    Estanislado Emms RN, BSN [place]  Managed [facility] RN Care Coordinator [phone]

## 2022-11-13 NOTE — Patient Instructions (Signed)
Visit Information  Ms. Judy Cross  - as a part of your Medicaid benefit, you are eligible for care management and care coordination services at no cost or copay. I was unable to reach you by phone today but would be happy to help you with your health related needs. Please feel free to call me @ [phone].   A member of the Managed Medicaid care management team will reach out to you again over the next 7 days.   Temitope Griffing RN, BSN [place]  Managed Medicaid RN Care Coordinator [phone]   

## 2022-11-14 ENCOUNTER — Telehealth: Payer: Self-pay | Admitting: Orthopaedic Surgery

## 2022-11-14 MED ORDER — HYDROCODONE-ACETAMINOPHEN 5-325 MG PO TABS: ORAL_TABLET | ORAL | 0 refills | Status: DC

## 2022-11-14 NOTE — Addendum Note (Signed)
Addended by: Earnstine Regal on: 11/14/2022 10:36 AM   Modules accepted: Orders

## 2022-11-14 NOTE — Telephone Encounter (Signed)
Dr. Sanjuan Cross pt - pt lvm requesting a refill on Hydrocodone 5-325 to be sent to [facility] on [address]

## 2022-11-20 ENCOUNTER — Other Ambulatory Visit (HOSPITAL_COMMUNITY): Payer: Self-pay | Admitting: Adult Health

## 2022-11-20 DIAGNOSIS — R928 Other abnormal and inconclusive findings on diagnostic imaging of breast: Secondary | ICD-10-CM

## 2022-11-21 ENCOUNTER — Ambulatory Visit (HOSPITAL_COMMUNITY)
Admission: RE | Admit: 2022-11-21 | Discharge: 2022-11-21 | Disposition: A | Discharge status: Home / Self Care | Payer: Medicaid Other | Source: Ambulatory Visit | Attending: Adult Health | Admitting: Adult Health

## 2022-11-21 ENCOUNTER — Telehealth: Payer: Self-pay | Admitting: Orthopaedic Surgery

## 2022-11-21 ENCOUNTER — Encounter (HOSPITAL_COMMUNITY): Payer: Self-pay

## 2022-11-21 DIAGNOSIS — N6321 Unspecified lump in the left breast, upper outer quadrant: Secondary | ICD-10-CM | POA: Diagnosis not present

## 2022-11-21 DIAGNOSIS — R92323 Mammographic fibroglandular density, bilateral breasts: Secondary | ICD-10-CM | POA: Diagnosis not present

## 2022-11-21 DIAGNOSIS — R928 Other abnormal and inconclusive findings on diagnostic imaging of breast: Secondary | ICD-10-CM | POA: Insufficient documentation

## 2022-11-21 DIAGNOSIS — N6323 Unspecified lump in the left breast, lower outer quadrant: Secondary | ICD-10-CM | POA: Diagnosis not present

## 2022-11-21 MED ORDER — HYDROCODONE-ACETAMINOPHEN 5-325 MG PO TABS: ORAL_TABLET | ORAL | 0 refills | Status: DC

## 2022-11-21 NOTE — Telephone Encounter (Signed)
Dr. Sanjuan Dame pt - pt lvm stating it's time for her refill, Hydrocodone 5-325 to be sent to [facility].

## 2022-11-22 ENCOUNTER — Other Ambulatory Visit: Payer: Commercial Managed Care - HMO | Admitting: *Deleted

## 2022-11-22 ENCOUNTER — Other Ambulatory Visit: Payer: Commercial Managed Care - HMO | Admitting: Licensed Clinical Social Worker

## 2022-11-22 DIAGNOSIS — F331 Major depressive disorder, recurrent, moderate: Secondary | ICD-10-CM

## 2022-11-22 DIAGNOSIS — F419 Anxiety disorder, unspecified: Secondary | ICD-10-CM

## 2022-11-22 NOTE — Patient Outreach (Signed)
  Medicaid Managed Care   Unsuccessful Attempt Note   11/22/2022 Name: Judy Cross MRN: [id number] DOB: Aug 26, 1965  Referred by: Anabel Halon, MD Reason for referral : High Risk Managed Medicaid (Unsuccessful RNCM follow up telephone outreach)   A second unsuccessful telephone outreach was attempted today. The patient was referred to the case management team for assistance with care management and care coordination.    Follow Up Plan: A HIPAA compliant phone message was left for the patient providing contact information and requesting a return call. and The Managed Medicaid care management team will reach out to the patient again over the next 7 days.    Estanislado Emms RN, BSN [place]  Managed [facility] RN Care Coordinator [phone]

## 2022-11-22 NOTE — Patient Outreach (Signed)
Medicaid Managed Care Social Work Note  11/22/2022 Name:  Judy Cross MRN:  [id number] DOB:  April 23, 1966  Judy Cross is an 57 y.o. year old female who is a primary patient of Judy Halon, MD.  The Medicaid Managed Care Coordination team was consulted for assistance with:  Mental Health Counseling and Resources  Judy Cross was given information about Medicaid Managed Care Coordination team services today. Judy Cross Patient agreed to services and verbal consent obtained.  Engaged with patient  for by telephone forfollow up visit in response to referral for case management and/or care coordination services.   Assessments/Interventions:  Review of past medical history, allergies, medications, health status, including review of consultants reports, laboratory and other test data, was performed as part of comprehensive evaluation and provision of chronic care management services.  SDOH: (Social Determinant of Health) assessments and interventions performed: SDOH Interventions    Flowsheet Row Patient Outreach Telephone from 11/22/2022 in [place] POPULATION HEALTH DEPARTMENT Patient Outreach Telephone from 10/05/2022 in [place] POPULATION HEALTH DEPARTMENT Patient Outreach Telephone from 09/07/2022 in [place] POPULATION HEALTH DEPARTMENT Patient Outreach Telephone from 08/23/2022 in [place] POPULATION HEALTH DEPARTMENT Patient Outreach Telephone from 08/21/2022 in [place] POPULATION HEALTH DEPARTMENT Patient Outreach Telephone from 08/10/2022 in [place] POPULATION HEALTH DEPARTMENT  SDOH Interventions        Food Insecurity Interventions -- -- -- -- Intervention Not Indicated --  Housing Interventions -- -- -- -- --  [Currently staying with her son. Waiting for disability determination] --  Transportation Interventions -- Payor Benefit -- -- -- Intervention Not Indicated  Stress Interventions Offered [employer], Provide Counseling -- Offered  [employer], Provide Counseling Offered [employer], Provide Counseling -- --       Advanced Directives Status:  See Care Plan for related entries.  Care Plan                 Allergies  Allergen Reactions   Lisinopril Swelling    swelling to entire mouth.   Penicillins Rash    Has patient had a PCN reaction causing immediate rash, facial/tongue/throat swelling, SOB or lightheadedness with hypotension: no - next day Has patient had a PCN reaction causing severe rash involving mucus membranes or skin necrosis: No Has patient had a PCN reaction that required hospitalization No Has patient had a PCN reaction occurring within the last 10 years: Yes If all of the above answers are "NO", then may proceed with Cephalosporin use.    Tramadol Other (See Comments)    dizziness    Medications Reviewed Today     Reviewed by Judy Dach, RN (Registered Nurse) on 10/05/22 at 1633  Med List Status: <None>   Medication Order Taking? Sig Documenting Provider Last Dose Status Informant  hydrochlorothiazide (HYDRODIURIL) 25 MG tablet [id number] Yes Take 1 tablet (25 mg total) by mouth daily. Judy Halon, MD Taking Active   HYDROcodone-acetaminophen (NORCO/VICODIN) 5-325 MG tablet [id number] Yes One tablet every six hours for pain.  Limit 5 days. Judy Mclean, MD Taking Active   levETIRAcetam (KEPPRA) 750 MG tablet [id number] Yes Take 1 tablet (750 mg total) by mouth 2 (two) times daily. Judy Norfolk, MD Taking Active            Med Note (ROBB, MELANIE A   Mon Aug 21, 2022  9:09 AM)    sertraline (ZOLOFT) 50 MG tablet [id number] Yes Take 1 tablet (50 mg total) by mouth daily.  Judy Halon, MD Taking Active             Patient Active Problem List   Diagnosis Date Noted   Breast pain, left 09/26/2022   Moderate episode of recurrent major depressive disorder (HCC) 09/08/2022   DDD (degenerative disc disease), lumbar 09/08/2022   Encounter for  general adult medical examination with abnormal findings 07/27/2022   Hypokalemia 03/22/2022   Vitamin D deficiency 03/22/2022   Fall 03/22/2022   Tobacco abuse 01/06/2022   Spinal stenosis of lumbar region 06/17/2021   Mass of lower outer quadrant of left breast 05/09/2021   Arthritis of left knee 12/02/2020   Chronic left shoulder pain 12/02/2020   Allergic sinusitis 12/02/2020   Mass of left finger    Anxiety 06/12/2019   Seizure disorder (HCC) 06/12/2019   Environmental and seasonal allergies 06/12/2019   Abnormal Pap smear of cervix 05/27/2019   Chronic midline low back pain with left-sided sciatica 01/12/2017   Essential hypertension 12/07/2016   Mild intermittent asthma without complication 12/07/2016    Conditions to be addressed/monitored per PCP order:  Anxiety  Care Plan : LCSW Plan of Care  Updates made by Judy Bryant, LCSW since 11/22/2022 12:00 AM     Problem: Coping Skills for symptoms of Anxiety      Goal: Coping Skills Enhanced by connecting for ongoing therapy   Start Date: 07/20/2021  Recent Progress: On track  Priority: High  Note:   Current Barriers:  Disease Management support and education needs related to Anxiety with Panic Symptoms, and Stress at home  CSW Clinical Goal(s):  Patient  will work with [facility] LCSW to address needs related to managing symptoms of anxiety  through collaboration with Clinical Social Worker, provider, and care team Patient will gain counseling and psychiatry services to help increase mental health support  Interventions: Inter-disciplinary care team collaboration (see longitudinal plan of care) Evaluation of current treatment plan related to self management and patient's adherence to plan as established by provider Review resources, discussed options and provided patient information about  Department of Social Services ( currently receiving services ) Transportation provided by insurance provider (currently  using) Discussed therapy with options based on need and insurance  BSW completed a telephone outreach with patient, she states she is not working and does not have any income. Patient states she is in need of resources for everything. BSW will mail patient a list of resources.  Mental Health:  (Status: New goal.) Evaluation of current treatment plan related to Anxiety with Panic Symptoms, Solution-Focused Strategies employed:  Mindfulness or Relaxation training provided Active listening / Reflection utilized  Emotional Support Provided Quality of sleep assessed & Sleep Hygiene techniques promoted  Provided EMMI education information on relieving stress, and insomnia getting a good night's rest Suicidal Ideation/Homicidal Ideation assessed: denied Collaborated with [facility] in [place], [place] [phone]. Made referral to [facility]( patient has appointment 07/22/2021. Patient was unable to attend this appointment due to a family emergency. She has not rescheduled this appointment yet but will do so today. 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 Patient reports that she has already completed her UDS, TB test and Criminal Background. Update New mental health referrals placed for psychiatry and counseling to [facility]. 06/16/22 Update- Patient reports that she has not received a call from  [facility] [place]. One of the two referrals that [facility] LCSW made is denied. Patient is agreeable to contact agency next week (they close early on Fridays) to make sure she gets scheduled for her initial sessions. Emotional support provided. Mental Health resource contact information provided as well. 06/26/22 update- Patient reports that she has not heard from [facility] yet but will contact them today as they are closed  today for the holiday. [facility] provided brief coping skill education for stress management. [facility] LCSW will follow up within 2 weeks. Patient reports that she successfully retrieved email that [facility] LCSW sent during last session. 07/10/22 update- [facility] LCSW was informed by patient that her boyfriend of 2 months passed away over the weekend which has increased her symptoms of grief and depression. Grief support provided to patient and she was receptive to this. [facility] LCSW made call to [facility] [place] and spoke to staff. They will contact patient directly today to schedule her counseling appointment but her psychiatry referral will have to be reviewed first before scheduling even though that referral was made over a month ago. [facility] LCSW will follow up next month. LCSW 08/03/22 update- Patient is not picking up her depression medication or any other medication due to financial issues. [facility] LCSW will update Pharmacist that is involved in her care. Emotional support and crisis resource education provided. Patient was encouraged to consider [facility] walk in clinic until her appointment next month at [facility] [place], [place].  Patient reports that she is implementing appropriate boundaries to implement negative people, places and things. Patient works on her breathing exercises daily.  Patient denies any SI/HI. Patient reports that she continues to experience signs of grief but was able to turn it around and even let a balloon go in remembrance of her father. Positive reinforcement provided.   LCSW provided education on relaxation techniques such as meditation, deep breathing, massage, grounding exercsies or yoga that can activate the body's relaxation response and ease symptoms of stress and anxiety. LCSW ask that when pt is struggling with difficult emotions and racing thoughts that they start this relaxation response process. LCSW provided extensive education on healthy coping skills for  anxiety. SW used active and reflective listening, validated patient's feelings/concerns, and provided emotional support. LCSW 08/23/22 update- Patient was reminded of upcoming counseling appointment and she confirms that she will be able to attend this appointment. LCSW 09/07/22 update- Patient was a NO SHOW for scheduled counseling appointment. Patient reports that "I'm not as stressed now that I recently got approved for disability but I would still like to gain therapy." Patient was advised that she must be the one to contact [facility] to reschedule appointment because agency prefers to talk to patient directly. Patient is agreeable to contact agency today. 09/19/22 update- Patient reports that she contacted agency but was unable to reschedule her counseling appointment but she will try again once she returns back home as she is currently out doing errands. [facility] LCSW will close referral soon as patient has been provided extension mental health resource education and stress management coping skill education. Update 6/12/24[facility] LCSW received new referral from [facility] RNCM " She was sounding down and I asked her if she had anyone to talk to and she said you had helped her but she never seemed to hear back from those people so we agreed she needed your help again." Patient reports that she knows she was  a no show for one counseling appointment in the past for [facility] due to physical issues at that time and wishes to gain an additional referral for both counseling/psychiatry for program. New referral placed for both psychiatry and counseling on 11/22/22. Emotional support and coping skill education provided.   Patient Self-Care Activities: Practice relaxed breathing 3 times a day     24- Hour Availability:    [facility]  [address] [place], [place] [phone] Crisis [phone]   [facility]  [phone]   [place] [facility]  [phone]    [facility]  [phone] (after hours)   Therapeutic Alternative/Mobile Crisis   [phone]   [place] National Suicide Hotline  (917)756-9181 (TALK) OR 988   Call 911 or go to emergency room   [facility]  [phone]);  [place] and [employer]  [phone]); [place], [place], [place], [place], [place], [place], [place]        10 LITTLE Things To Do When You're Feeling Too Down To Do Anything  Take a shower. Even if you plan to stay in all day long and not see a soul, take a shower. It takes the most effort to hop in to the shower but once you do, you'll feel immediate results. It will wake you up and you'll be feeling much fresher (and cleaner too).  Brush and floss your teeth. Give your teeth a good brushing with a floss finish. It's a small task but it feels so good and you can check 'taking care of your health' off the list of things to do.  Do something small on your list. Most of [place] have some small thing we would like to get done (load of laundry, sew a button, email a friend). Doing one of these things will make you feel like you've accomplished something.  Drink water. Drinking water is easy right? It's also really beneficial for your health so keep a glass beside you all day and take sips often. It gives you energy and prevents you from boredom eating.  Do some floor exercises. The last thing you want to do is exercise but it might be just the thing you need the most. Keep it simple and do exercises that involve sitting or laying on the floor. Even the smallest of exercises release chemicals in the brain that make you feel good. Yoga stretches or core exercises are going to make you feel good with minimal effort.  Make your bed. Making your bed takes a few minutes but it's productive and you'll feel relieved when it's done. An unmade bed is a huge visual  reminder that you're having an unproductive day. Do it and consider it your housework for the day.  Put on some nice clothes. Take the sweatpants off even if you don't plan to go anywhere. Put on clothes that make you feel good. Take a look in the mirror so your brain recognizes the sweatpants have been replaced with clothes that make you look great. It's an instant confidence booster.  Wash the dishes. A pile of dirty dishes in the sink is a reflection of your mood. It's possible that if you wash up the dishes, your mood will follow suit. It's worth a try.  Cook a real meal. If you have the luxury to have a "do nothing" day, you have time to make a real meal for yourself. Make a meal  that you love to eat. The process is good to get you out of the funk and the food will ensure you have more energy for tomorrow.  Write out your thoughts by hand. When you hand write, you stimulate your brain to focus on the moment that you're in so make yourself comfortable and write whatever comes into your mind. Put those thoughts out on paper so they stop spinning around in your head. Those thoughts might be the very thing holding you down.      Follow up:  Patient agrees to Care Plan and Follow-up.  Plan: The Managed Medicaid care management team will reach out to the patient again over the next 30 days.  Dickie La, BSW, MSW, [employer] Managed Medicaid LCSW [facility]  [facility] [email or web address] Phone: [phone]

## 2022-11-22 NOTE — Patient Instructions (Signed)
Visit Information  Ms. Judy Cross  - as a part of your Medicaid benefit, you are eligible for care management and care coordination services at no cost or copay. I was unable to reach you by phone today but would be happy to help you with your health related needs. Please feel free to call me @ [phone].   A member of the Managed Medicaid care management team will reach out to you again over the next 7 days.   Jaynie Hitch RN, BSN [place]  Managed Medicaid RN Care Coordinator [phone]   

## 2022-11-22 NOTE — Patient Instructions (Signed)
Visit Information  Ms. Frable was given information about Medicaid Managed Care team care coordination services as a part of their [facility] Medicaid benefit. Neema VILENA CAJAS verbally consented to engagement with the [facility] Managed Care team.   If you are experiencing a medical emergency, please call 911 or report to your local emergency department or urgent care.   If you have a non-emergency medical problem during routine business hours, please contact your provider's office and ask to speak with a nurse.   For questions related to your [facility], please call: [phone] or visit the homepage here: [email or web address]  If you would like to schedule transportation through your [facility], please call the following number at least 2 days in advance of your appointment: [phone]   Rides for urgent appointments can also be made after hours by calling Member Services.  Call the Behavioral Health Crisis Line at [phone], at any time, 24 hours a day, 7 days a week. If you are in danger or need immediate medical attention call 911.  If you would like help to quit smoking, call 1-800-QUIT-NOW ((934)770-5824) OR Espaol: 1-855-Djelo-Ya (7-425-956-3875) o para ms informacin haga clic aqu or Text READY to 643-329 to register via text   Following is a copy of your plan of care:  Care Plan : LCSW Plan of Care  Updates made by Gustavus Bryant, LCSW since 11/22/2022 12:00 AM     Problem: Coping Skills for symptoms of Anxiety      Goal: Coping Skills Enhanced by connecting for ongoing therapy   Start Date: 07/20/2021  Recent Progress: On track  Priority: High  Note:   Current Barriers:  Disease Management support and education needs related to Anxiety with Panic Symptoms, and Stress at home  CSW Clinical Goal(s):  Patient  will work with [facility] LCSW to  address needs related to managing symptoms of anxiety  through collaboration with Clinical Social Worker, provider, and care team Patient will gain counseling and psychiatry services to help increase mental health support  Patient Self-Care Activities: Practice relaxed breathing 3 times a day     24- Hour Availability:    [facility]  [address] [place], [place] [phone] Crisis [phone]   [facility] [phone]   [place] [facility]  [phone]    [facility]  [phone] (after hours)   Therapeutic Alternative/Mobile Crisis   [phone]   [place] National Suicide Hotline  (548) 064-8189 (TALK) OR 988   Call 911 or go to emergency room   [facility]  [phone]);  [place] and [employer]  [phone]); [place], [place], [place], [place], [place], [place], [place]        10 LITTLE Things To Do When You're Feeling Too Down To Do Anything  Take a shower. Even if you plan to stay in all day long and not see a soul, take a shower. It takes the most effort to hop in to the shower but once you do, you'll feel immediate results. It will wake you up and you'll be feeling much fresher (and cleaner too).  Brush and floss your teeth. Give your teeth a good brushing with a floss finish. It's a small task but it feels so good and you can check 'taking care of your health' off the list of things to do.  Do something small on your list. Most  of [place] have some small thing we would like to get done (load of laundry, sew a button, email a friend). Doing one of these things will make you feel like you've accomplished something.  Drink water. Drinking water is easy right? It's also really beneficial for your health so keep a glass beside you all day and take sips often. It gives you energy and prevents you from boredom eating.  Do some floor  exercises. The last thing you want to do is exercise but it might be just the thing you need the most. Keep it simple and do exercises that involve sitting or laying on the floor. Even the smallest of exercises release chemicals in the brain that make you feel good. Yoga stretches or core exercises are going to make you feel good with minimal effort.  Make your bed. Making your bed takes a few minutes but it's productive and you'll feel relieved when it's done. An unmade bed is a huge visual reminder that you're having an unproductive day. Do it and consider it your housework for the day.  Put on some nice clothes. Take the sweatpants off even if you don't plan to go anywhere. Put on clothes that make you feel good. Take a look in the mirror so your brain recognizes the sweatpants have been replaced with clothes that make you look great. It's an instant confidence booster.  Wash the dishes. A pile of dirty dishes in the sink is a reflection of your mood. It's possible that if you wash up the dishes, your mood will follow suit. It's worth a try.  Cook a real meal. If you have the luxury to have a "do nothing" day, you have time to make a real meal for yourself. Make a meal that you love to eat. The process is good to get you out of the funk and the food will ensure you have more energy for tomorrow.  Write out your thoughts by hand. When you hand write, you stimulate your brain to focus on the moment that you're in so make yourself comfortable and write whatever comes into your mind. Put those thoughts out on paper so they stop spinning around in your head. Those thoughts might be the very thing holding you down.      Dickie La, BSW, MSW, [employer] Managed Medicaid LCSW [facility]  [facility] [email or web address] Phone: [phone]

## 2022-11-28 ENCOUNTER — Telehealth: Payer: Self-pay | Admitting: Orthopaedic Surgery

## 2022-11-28 MED ORDER — HYDROCODONE-ACETAMINOPHEN 5-325 MG PO TABS: ORAL_TABLET | ORAL | 0 refills | Status: DC

## 2022-11-28 NOTE — Telephone Encounter (Signed)
Dr. Keeling's pt - pt lvm stating it was time for her refill, Hydrocodone 5-325 to be sent to [facility] on [address] 

## 2022-11-30 ENCOUNTER — Other Ambulatory Visit: Payer: Medicaid Other | Admitting: Licensed Clinical Social Worker

## 2022-11-30 NOTE — Patient Instructions (Signed)
Visit Information  I'll call you on 12/22/22 at 2pm! Keep up the GREAT work :)  Ms. Aminov was given information about Medicaid Managed Care team care coordination services as a part of their [facility] Medicaid benefit. Nansi EKATERINA DELAUNE verbally consented to engagement with the [facility] Managed Care team.   If you are experiencing a medical emergency, please call 911 or report to your local emergency department or urgent care.   If you have a non-emergency medical problem during routine business hours, please contact your provider's office and ask to speak with a nurse.   For questions related to your [facility], please call: [phone] or visit the homepage here: [email or web address]  If you would like to schedule transportation through your [facility], please call the following number at least 2 days in advance of your appointment: [phone]   Rides for urgent appointments can also be made after hours by calling Member Services.  Call the Behavioral Health Crisis Line at [phone], at any time, 24 hours a day, 7 days a week. If you are in danger or need immediate medical attention call 911.  If you would like help to quit smoking, call 1-800-QUIT-NOW (5755195272) OR Espaol: 1-855-Djelo-Ya (2-536-644-0347) o para ms informacin haga clic aqu or Text READY to 425-956 to register via text  Following is a copy of your plan of care:  Care Plan : LCSW Plan of Care  Updates made by Gustavus Bryant, LCSW since 11/30/2022 12:00 AM     Problem: Coping Skills for symptoms of Anxiety      Goal: Coping Skills Enhanced by connecting for ongoing therapy   Start Date: 07/20/2021  Recent Progress: On track  Priority: High  Note:   Current Barriers:  Disease Management support and education needs related to Anxiety with Panic Symptoms, and Stress at  home  CSW Clinical Goal(s):  Patient  will work with [facility] LCSW to address needs related to managing symptoms of anxiety  through collaboration with Clinical Social Worker, provider, and care team Patient will gain counseling and psychiatry services to help increase mental health support  Patient Self-Care Activities: Practice relaxed breathing 3 times a day     24- Hour Availability:    [facility]  [address] [place], [place] [phone] Crisis [phone]   [facility] [phone]   [place] [facility]  [phone]    [facility]  [phone] (after hours)   Therapeutic Alternative/Mobile Crisis   [phone]   [place] National Suicide Hotline  [phone] (TALK) OR 988   Call 911 or go to emergency room   [facility]  [phone]);  [place] and [employer]  [phone]); [place], [place], [place], [place], [place], [place], [place]        10 LITTLE Things To Do When You're Feeling Too Down To Do Anything  Take a shower. Even if you plan to stay in all day long and not see a soul, take a shower. It takes the most effort to hop in to the shower but once you do, you'll feel immediate results. It will wake you up and you'll be feeling much fresher (and cleaner too).  Brush and floss your teeth. Give your teeth a good brushing with a floss finish. It's a small task but it feels so good and you can check 'taking care of your health' off the  list of things to do.  Do something small on your list. Most of [place] have some small thing we would like to get done (load of laundry, sew a button, email a friend). Doing one of these things will make you feel like you've accomplished something.  Drink water. Drinking water is easy right? It's also really beneficial for your health so keep a glass beside you all day and take sips often. It gives you  energy and prevents you from boredom eating.  Do some floor exercises. The last thing you want to do is exercise but it might be just the thing you need the most. Keep it simple and do exercises that involve sitting or laying on the floor. Even the smallest of exercises release chemicals in the brain that make you feel good. Yoga stretches or core exercises are going to make you feel good with minimal effort.  Make your bed. Making your bed takes a few minutes but it's productive and you'll feel relieved when it's done. An unmade bed is a huge visual reminder that you're having an unproductive day. Do it and consider it your housework for the day.  Put on some nice clothes. Take the sweatpants off even if you don't plan to go anywhere. Put on clothes that make you feel good. Take a look in the mirror so your brain recognizes the sweatpants have been replaced with clothes that make you look great. It's an instant confidence booster.  Wash the dishes. A pile of dirty dishes in the sink is a reflection of your mood. It's possible that if you wash up the dishes, your mood will follow suit. It's worth a try.  Cook a real meal. If you have the luxury to have a "do nothing" day, you have time to make a real meal for yourself. Make a meal that you love to eat. The process is good to get you out of the funk and the food will ensure you have more energy for tomorrow.  Write out your thoughts by hand. When you hand write, you stimulate your brain to focus on the moment that you're in so make yourself comfortable and write whatever comes into your mind. Put those thoughts out on paper so they stop spinning around in your head. Those thoughts might be the very thing holding you down.

## 2022-11-30 NOTE — Patient Outreach (Signed)
Medicaid Managed Care Social Work Note  11/30/2022 Name:  Judy Cross MRN:  [id number] DOB:  Aug 23, 1965  Judy Cross is an 57 y.o. year old female who is a primary patient of Anabel Halon, MD.  The Medicaid Managed Care Coordination team was consulted for assistance with:  Mental Health Counseling and Resources  Ms. Trimpe was given information about Medicaid Managed Care Coordination team services today. Judy Cross Patient agreed to services and verbal consent obtained.  Engaged with patient  for by telephone forfollow up visit in response to referral for case management and/or care coordination services.   Assessments/Interventions:  Review of past medical history, allergies, medications, health status, including review of consultants reports, laboratory and other test data, was performed as part of comprehensive evaluation and provision of chronic care management services.  SDOH: (Social Determinant of Health) assessments and interventions performed: SDOH Interventions    Flowsheet Row Patient Outreach Telephone from 11/30/2022 in [place] POPULATION HEALTH DEPARTMENT Patient Outreach Telephone from 11/22/2022 in [place] POPULATION HEALTH DEPARTMENT Patient Outreach Telephone from 10/05/2022 in [place] POPULATION HEALTH DEPARTMENT Patient Outreach Telephone from 09/07/2022 in [place] POPULATION HEALTH DEPARTMENT Patient Outreach Telephone from 08/23/2022 in [place] POPULATION HEALTH DEPARTMENT Patient Outreach Telephone from 08/21/2022 in [place] POPULATION HEALTH DEPARTMENT  SDOH Interventions        Food Insecurity Interventions -- -- -- -- -- Intervention Not Indicated  Housing Interventions -- -- -- -- -- --  [Currently staying with her son. Waiting for disability determination]  Transportation Interventions -- -- Payor Benefit -- -- --  Stress Interventions Offered [employer], Provide Counseling Offered [employer] Resources,  Provide Counseling -- Offered [employer] Resources, Provide Counseling Offered [employer] Resources, Provide Counseling --       Advanced Directives Status:  See Care Plan for related entries.  Care Plan                 Allergies  Allergen Reactions   Lisinopril Swelling    swelling to entire mouth.   Penicillins Rash    Has patient had a PCN reaction causing immediate rash, facial/tongue/throat swelling, SOB or lightheadedness with hypotension: no - next day Has patient had a PCN reaction causing severe rash involving mucus membranes or skin necrosis: No Has patient had a PCN reaction that required hospitalization No Has patient had a PCN reaction occurring within the last 10 years: Yes If all of the above answers are "NO", then may proceed with Cephalosporin use.    Tramadol Other (See Comments)    dizziness    Medications Reviewed Today     Reviewed by Heidi Dach, RN (Registered Nurse) on 10/05/22 at 1633  Med List Status: <None>   Medication Order Taking? Sig Documenting Provider Last Dose Status Informant  hydrochlorothiazide (HYDRODIURIL) 25 MG tablet [id number] Yes Take 1 tablet (25 mg total) by mouth daily. Anabel Halon, MD Taking Active   HYDROcodone-acetaminophen (NORCO/VICODIN) 5-325 MG tablet [id number] Yes One tablet every six hours for pain.  Limit 5 days. Darreld Mclean, MD Taking Active   levETIRAcetam (KEPPRA) 750 MG tablet [id number] Yes Take 1 tablet (750 mg total) by mouth 2 (two) times daily. Windell Norfolk, MD Taking Active            Med Note Ardelia Mems, MELANIE A   Mon Aug 21, 2022  9:09 AM)    sertraline (ZOLOFT) 50 MG tablet [id number] Yes Take 1 tablet (50 mg total)  by mouth daily. Anabel Halon, MD Taking Active             Patient Active Problem List   Diagnosis Date Noted   Breast pain, left 09/26/2022   Moderate episode of recurrent major depressive disorder (HCC) 09/08/2022   DDD (degenerative disc disease), lumbar  09/08/2022   Encounter for general adult medical examination with abnormal findings 07/27/2022   Hypokalemia 03/22/2022   Vitamin D deficiency 03/22/2022   Fall 03/22/2022   Tobacco abuse 01/06/2022   Spinal stenosis of lumbar region 06/17/2021   Mass of lower outer quadrant of left breast 05/09/2021   Arthritis of left knee 12/02/2020   Chronic left shoulder pain 12/02/2020   Allergic sinusitis 12/02/2020   Mass of left finger    Anxiety 06/12/2019   Seizure disorder (HCC) 06/12/2019   Environmental and seasonal allergies 06/12/2019   Abnormal Pap smear of cervix 05/27/2019   Chronic midline low back pain with left-sided sciatica 01/12/2017   Essential hypertension 12/07/2016   Mild intermittent asthma without complication 12/07/2016    Conditions to be addressed/monitored per PCP order:  Anxiety and Depression  Care Plan : LCSW Plan of Care  Updates made by Gustavus Bryant, LCSW since 11/30/2022 12:00 AM     Problem: Coping Skills for symptoms of Anxiety      Goal: Coping Skills Enhanced by connecting for ongoing therapy   Start Date: 07/20/2021  Recent Progress: On track  Priority: High  Note:   Current Barriers:  Disease Management support and education needs related to Anxiety with Panic Symptoms, and Stress at home  CSW Clinical Goal(s):  Patient  will work with [facility] LCSW to address needs related to managing symptoms of anxiety  through collaboration with Clinical Social Worker, provider, and care team Patient will gain counseling and psychiatry services to help increase mental health support  Interventions: Inter-disciplinary care team collaboration (see longitudinal plan of care) Evaluation of current treatment plan related to self management and patient's adherence to plan as established by provider Review resources, discussed options and provided patient information about  Department of Social Services ( currently receiving services ) Transportation provided by  insurance provider (currently using) Discussed therapy with options based on need and insurance  BSW completed a telephone outreach with patient, she states she is not working and does not have any income. Patient states she is in need of resources for everything. BSW will mail patient a list of resources.  Mental Health:  (Status: New goal.) Evaluation of current treatment plan related to Anxiety with Panic Symptoms, Solution-Focused Strategies employed:  Mindfulness or Relaxation training provided Active listening / Reflection utilized  Emotional Support Provided Quality of sleep assessed & Sleep Hygiene techniques promoted  Provided EMMI education information on relieving stress, and insomnia getting a good night's rest Suicidal Ideation/Homicidal Ideation assessed: denied Collaborated with [facility] in [place], [place] [phone]. Made referral to [facility]( patient has appointment 07/22/2021. Patient was unable to attend this appointment due to a family emergency. She has not rescheduled this appointment yet but will do so today. 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 Patient reports that she has already completed her UDS, TB test and Criminal Background. Update New mental health referrals placed for psychiatry and counseling to [facility]. 06/16/22 Update- Patient reports that she has  not received a call from [facility]. One of the two referrals that [facility] LCSW made is denied. Patient is agreeable to contact agency next week (they close early on Fridays) to make sure she gets scheduled for her initial sessions. Emotional support provided. Mental Health resource contact information provided as well. 06/26/22 update- Patient reports that she has not heard from [facility] yet but will contact  them today as they are closed today for the holiday. [facility] provided brief coping skill education for stress management. [facility] LCSW will follow up within 2 weeks. Patient reports that she successfully retrieved email that [facility] LCSW sent during last session. 07/10/22 update- [facility] LCSW was informed by patient that her boyfriend of 2 months passed away over the weekend which has increased her symptoms of grief and depression. Grief support provided to patient and she was receptive to this. [facility] LCSW made call to [facility] and spoke to staff. They will contact patient directly today to schedule her counseling appointment but her psychiatry referral will have to be reviewed first before scheduling even though that referral was made over a month ago. [facility] LCSW will follow up next month. LCSW 08/03/22 update- Patient is not picking up her depression medication or any other medication due to financial issues. [facility] LCSW will update Pharmacist that is involved in her care. Emotional support and crisis resource education provided. Patient was encouraged to consider [facility] walk in clinic until her appointment next month at [facility] [place], [place].  Patient reports that she is implementing appropriate boundaries to implement negative people, places and things. Patient works on her breathing exercises daily.  Patient denies any SI/HI. Patient reports that she continues to experience signs of grief but was able to turn it around and even let a balloon go in remembrance of her father. Positive reinforcement provided.   LCSW provided education on relaxation techniques such as meditation, deep breathing, massage, grounding exercsies or yoga that can activate the body's relaxation response and ease symptoms of stress and anxiety. LCSW ask that when pt is struggling with difficult emotions and racing thoughts that they start this relaxation response process. LCSW provided extensive education on  healthy coping skills for anxiety. SW used active and reflective listening, validated patient's feelings/concerns, and provided emotional support. LCSW 08/23/22 update- Patient was reminded of upcoming counseling appointment and she confirms that she will be able to attend this appointment. LCSW 09/07/22 update- Patient was a NO SHOW for scheduled counseling appointment. Patient reports that "I'm not as stressed now that I recently got approved for disability but I would still like to gain therapy." Patient was advised that she must be the one to contact [facility] to reschedule appointment because agency prefers to talk to patient directly. Patient is agreeable to contact agency today. 09/19/22 update- Patient reports that she contacted agency but was unable to reschedule her counseling appointment but she will try again once she returns back home as she is currently out doing errands. [facility] LCSW will close referral soon as patient has been provided extension mental health resource education and stress management coping skill education. Update 6/12/24[facility] LCSW received new referral from [facility] RNCM " She was sounding down and I asked her if she had anyone to talk to and she said you had helped her but she never seemed to hear back from those people so we agreed she needed your help again." Patient reports  that she knows she was a no show for one counseling appointment in the past for [facility] due to physical issues at that time and wishes to gain an additional referral for both counseling/psychiatry for program. New referral placed for both psychiatry and counseling on 11/22/22. Emotional support and coping skill education provided. 11/30/22- Patient reports that she has successfully scheduled her psychiatry appointment for 12/25/22 but has not yet scheduled her therapy session and will do so after her initial psychiatry appointment. She reports improvement in her mood management  recently due her daily effort in implementing self-care into her routine. Positive reinforcement provided.   Patient Self-Care Activities: Practice relaxed breathing 3 times a day     24- Hour Availability:    [facility]  [address] [place], [place] [phone] Crisis [phone]   [facility] [phone]   [place] [facility]  [phone]    [facility]  [phone] (after hours)   Therapeutic Alternative/Mobile Crisis   [phone]   [place] National Suicide Hotline  [phone] (TALK) OR 988   Call 911 or go to emergency room   [facility]  [phone]);  [place] and [employer]  [phone]); [place], [place], [place], [place], [place], [place], [place]        10 LITTLE Things To Do When You're Feeling Too Down To Do Anything  Take a shower. Even if you plan to stay in all day long and not see a soul, take a shower. It takes the most effort to hop in to the shower but once you do, you'll feel immediate results. It will wake you up and you'll be feeling much fresher (and cleaner too).  Brush and floss your teeth. Give your teeth a good brushing with a floss finish. It's a small task but it feels so good and you can check 'taking care of your health' off the list of things to do.  Do something small on your list. Most of [place] have some small thing we would like to get done (load of laundry, sew a button, email a friend). Doing one of these things will make you feel like you've accomplished something.  Drink water. Drinking water is easy right? It's also really beneficial for your health so keep a glass beside you all day and take sips often. It gives you energy and prevents you from boredom eating.  Do some floor exercises. The last thing you want to do is exercise but it might be just the thing you need the most. Keep it simple and do  exercises that involve sitting or laying on the floor. Even the smallest of exercises release chemicals in the brain that make you feel good. Yoga stretches or core exercises are going to make you feel good with minimal effort.  Make your bed. Making your bed takes a few minutes but it's productive and you'll feel relieved when it's done. An unmade bed is a huge visual reminder that you're having an unproductive day. Do it and consider it your housework for the day.  Put on some nice clothes. Take the sweatpants off even if you don't plan to go anywhere. Put on clothes that make you feel good. Take a look in the mirror so your brain recognizes the sweatpants have been replaced with clothes that make you look great. It's an instant confidence booster.  Wash the dishes. A pile of dirty dishes  in the sink is a reflection of your mood. It's possible that if you wash up the dishes, your mood will follow suit. It's worth a try.  Cook a real meal. If you have the luxury to have a "do nothing" day, you have time to make a real meal for yourself. Make a meal that you love to eat. The process is good to get you out of the funk and the food will ensure you have more energy for tomorrow.  Write out your thoughts by hand. When you hand write, you stimulate your brain to focus on the moment that you're in so make yourself comfortable and write whatever comes into your mind. Put those thoughts out on paper so they stop spinning around in your head. Those thoughts might be the very thing holding you down.      Follow up:  Patient agrees to Care Plan and Follow-up.  Plan: [facility] LCSW will follow up on 12/22/22 at 2 pm  Dickie La, BSW, MSW, [employer] Managed Medicaid LCSW [facility]  [email or web address] Phone: [phone]

## 2022-12-04 ENCOUNTER — Other Ambulatory Visit: Payer: Medicaid Other | Admitting: *Deleted

## 2022-12-04 ENCOUNTER — Encounter: Payer: Self-pay | Admitting: *Deleted

## 2022-12-04 NOTE — Patient Instructions (Signed)
Visit Information  Judy Cross was given information about Medicaid Managed Care team care coordination services as a part of their [facility] Community Plan Medicaid benefit. Judy Cross verbally consented to engagement with the [facility] Managed Care team.   If you are experiencing a medical emergency, please call 911 or report to your local emergency department or urgent care.   If you have a non-emergency medical problem during routine business hours, please contact your provider's office and ask to speak with a nurse.   For questions related to your [facility], please call: [phone] or visit the homepage here: [email or web address]  If you would like to schedule transportation through your [facility], please call the following number at least 2 days in advance of your appointment: [phone]   Rides for urgent appointments can also be made after hours by calling Member Services.  Call the Behavioral Health Crisis Line at [phone], at any time, 24 hours a day, 7 days a week. If you are in danger or need immediate medical attention call 911.  If you would like help to quit smoking, call 1-800-QUIT-NOW (865-678-5224) OR Espaol: 1-855-Djelo-Ya (2-536-644-0347) o para ms informacin haga clic aqu or Text READY to 425-956 to register via text  Judy Cross,   Please see education materials related to HTN provided as print materials.   The patient verbalized understanding of instructions, educational materials, and care plan provided today and agreed to receive a mailed copy of patient instructions, educational materials, and care plan.   Telephone follow up appointment with Managed Medicaid care management team member scheduled for:02/06/23 @ 12:30 pm  Estanislado Emms RN, BSN [place]  Managed [facility] RN Care Coordinator [phone]   Following is a copy  of your plan of care:  Care Plan : RN Care Manager Plan of Care  Updates made by Heidi Dach, RN since 12/04/2022 12:00 AM     Problem: Health Management needs related to HTN and Seizure disorder      Long-Range Goal: Development of Plan of Care to address Health Management needs related to HTN and Seizure disorder   Start Date: 08/10/2022  Expected End Date: 02/09/2023  Note:   Current Barriers:  Chronic Disease Management support and education needs related to HTN and Seizure Disorder Financial Constraints. Patient reports being in a "better place" and doing good. She has all of her medications and taking as prescribed. Denies needing medication for depression.  RNCM Clinical Goal(s):  Patient will verbalize understanding of plan for management of HTN and Seizure Disorder as evidenced by patient reports take all medications exactly as prescribed and will call provider for medication related questions as evidenced by patient reports and EMR documentation    attend all scheduled medical appointments: scheduling follow up with PCP as evidenced by provider documentation in EMR        through collaboration with RN Care manager, provider, and care team.   Interventions: Inter-disciplinary care team collaboration (see longitudinal plan of care) Evaluation of current treatment plan related to  self management and patient's adherence to plan as established by provider Provided therapeutic listening Reviewed and discussed recent breast imaging   Seizure Disorder  (Status: Goal on Track (progressing): YES.) Long Term Goal  Evaluation of current treatment plan related to  seizure disorder ,  self-management and patient's adherence to plan as established by provider. Discussed plans with patient for ongoing care management follow up and provided patient with direct contact  information for care management team Reviewed medications with patient and discussed the importance of taking prescribed  medications as directed; Reviewed scheduled/upcoming provider appointments including scheduling follow up with PCP; Assessed social determinant of health barriers;  Advised patient to schedule follow up with PCP and take all medications with her to the appointment  Hypertension: (Status: Goal on Track (progressing): YES.) Long Term Goal  Last practice recorded BP readings:  BP Readings from Last 3 Encounters:  09/26/22 113/80  09/08/22 123/71  07/27/22 (!) 148/84  Most recent eGFR/CrCl:  Lab Results  Component Value Date   EGFR 99 09/07/2022    No components found for: "CRCL"  Evaluation of current treatment plan related to hypertension self management and patient's adherence to plan as established by provider;   Provided education to patient re: stroke prevention, s/s of heart attack and stroke; Reviewed prescribed diet heart healthy Reviewed medications with patient and discussed importance of compliance;  Counseled on the importance of exercise goals with target of 150 minutes per week Reviewed scheduled/upcoming provider appointments including:  Assessed social determinant of health barriers;    Patient Goals/Self-Care Activities: Take medications as prescribed   Attend all scheduled provider appointments Call provider office for new concerns or questions  keep all doctor appointments take medications for blood pressure exactly as prescribed eat more whole grains, fruits and vegetables, lean meats and healthy fats Decrease prepackaged foods which are high in sodium

## 2022-12-04 NOTE — Patient Outreach (Signed)
Medicaid Managed Care   Nurse Care Manager Note  12/04/2022 Name:  Judy Cross MRN:  [id number] DOB:  02-23-1966  Judy Cross is an 57 y.o. year old female who is Cross primary patient of Judy Halon, MD.  The [facility] Managed Care Coordination team was consulted for assistance with:    HTN seizures  Judy Cross was given information about Medicaid Managed Care Coordination team services today. Judy Cross Patient agreed to services and verbal consent obtained.  Engaged with patient by telephone for follow up visit in response to provider referral for case management and/or care coordination services.   Assessments/Interventions:  Review of past medical history, allergies, medications, health status, including review of consultants reports, laboratory and other test data, was performed as part of comprehensive evaluation and provision of chronic care management services.  SDOH (Social Determinants of Health) assessments and interventions performed: SDOH Interventions    Flowsheet Row Patient Outreach Telephone from 12/04/2022 in [place] POPULATION HEALTH DEPARTMENT Patient Outreach Telephone from 11/30/2022 in [place] POPULATION HEALTH DEPARTMENT Patient Outreach Telephone from 11/22/2022 in [place] POPULATION HEALTH DEPARTMENT Patient Outreach Telephone from 10/05/2022 in [place] POPULATION HEALTH DEPARTMENT Patient Outreach Telephone from 09/07/2022 in [place] POPULATION HEALTH DEPARTMENT Patient Outreach Telephone from 08/23/2022 in [place] POPULATION HEALTH DEPARTMENT  SDOH Interventions        Food Insecurity Interventions Intervention Not Indicated -- -- -- -- --  Housing Interventions Intervention Not Indicated -- -- -- -- --  Transportation Interventions -- -- -- Payor Benefit -- --  Utilities Interventions Intervention Not Indicated -- -- -- -- --  Stress Interventions -- [employer], Provide Counseling Offered [employer], Provide Counseling -- Offered [employer], Provide Counseling Offered [employer], Provide Counseling       Care Plan  Allergies  Allergen Reactions   Lisinopril Swelling    swelling to entire mouth.   Penicillins Rash    Has patient had Cross PCN reaction causing immediate rash, facial/tongue/throat swelling, SOB or lightheadedness with hypotension: no - next day Has patient had Cross PCN reaction causing severe rash involving mucus membranes or skin necrosis: No Has patient had Cross PCN reaction that required hospitalization No Has patient had Cross PCN reaction occurring within the last 10 years: Yes If all of the above answers are "NO", then may proceed with Cephalosporin use.    Tramadol Other (See Comments)    dizziness    Medications Reviewed Today     Reviewed by Judy Dach, RN (Registered Nurse) on 12/04/22 at 1247  Med List Status: <None>   Medication Order Taking? Sig Documenting Provider Last Dose Status Informant  hydrochlorothiazide (HYDRODIURIL) 25 MG tablet [id number] Yes Take 1 tablet (25 mg total) by mouth daily. Judy Halon, MD Taking Active   HYDROcodone-acetaminophen (NORCO/VICODIN) 5-325 MG tablet [id number] Yes One tablet every six hours for pain.  Limit 5 days. Judy Mclean, MD Taking Active   levETIRAcetam (KEPPRA) 750 MG tablet [id number] Yes Take 1 tablet (750 mg total) by mouth 2 (two) times daily. Judy Norfolk, MD Taking Active            Med Note Judy Cross, Judy Cross   Mon Aug 21, 2022  9:09 AM)    levocetirizine (XYZAL) 5 MG tablet [id number] Yes Take 5 mg by mouth daily. [provider] Taking Active   sertraline (ZOLOFT) 50 MG tablet [id number] No Take 1 tablet (50 mg total) by  mouth daily.  Patient not taking: Reported on 12/04/2022   Judy Halon, MD Not Taking Active             Patient Active Problem List   Diagnosis Date Noted   Breast pain, left 09/26/2022   Moderate episode of recurrent  major depressive disorder (HCC) 09/08/2022   DDD (degenerative disc disease), lumbar 09/08/2022   Encounter for general adult medical examination with abnormal findings 07/27/2022   Hypokalemia 03/22/2022   Vitamin D deficiency 03/22/2022   Fall 03/22/2022   Tobacco abuse 01/06/2022   Spinal stenosis of lumbar region 06/17/2021   Mass of lower outer quadrant of left breast 05/09/2021   Arthritis of left knee 12/02/2020   Chronic left shoulder pain 12/02/2020   Allergic sinusitis 12/02/2020   Mass of left finger    Anxiety 06/12/2019   Seizure disorder (HCC) 06/12/2019   Environmental and seasonal allergies 06/12/2019   Abnormal Pap smear of cervix 05/27/2019   Chronic midline low back pain with left-sided sciatica 01/12/2017   Essential hypertension 12/07/2016   Mild intermittent asthma without complication 12/07/2016    Conditions to be addressed/monitored per PCP order:  HTN and Seizures  Care Plan : RN Care Manager Plan of Care  Updates made by Judy Dach, RN since 12/04/2022 12:00 AM     Problem: Health Management needs related to HTN and Seizure disorder      Long-Range Goal: Development of Plan of Care to address Health Management needs related to HTN and Seizure disorder   Start Date: 08/10/2022  Expected End Date: 02/09/2023  Note:   Current Barriers:  Chronic Disease Management support and education needs related to HTN and Seizure Disorder Financial Constraints. Patient reports being in Cross "better place" and doing good. She has all of her medications and taking as prescribed. Denies needing medication for depression.  RNCM Clinical Goal(s):  Patient will verbalize understanding of plan for management of HTN and Seizure Disorder as evidenced by patient reports take all medications exactly as prescribed and will call provider for medication related questions as evidenced by patient reports and EMR documentation    attend all scheduled medical appointments:  scheduling follow up with PCP as evidenced by provider documentation in EMR        through collaboration with RN Care manager, provider, and care team.   Interventions: Inter-disciplinary care team collaboration (see longitudinal plan of care) Evaluation of current treatment plan related to  self management and patient's adherence to plan as established by provider Provided therapeutic listening Reviewed and discussed recent breast imaging   Seizure Disorder  (Status: Goal on Track (progressing): YES.) Long Term Goal  Evaluation of current treatment plan related to  seizure disorder ,  self-management and patient's adherence to plan as established by provider. Discussed plans with patient for ongoing care management follow up and provided patient with direct contact information for care management team Reviewed medications with patient and discussed the importance of taking prescribed medications as directed; Reviewed scheduled/upcoming provider appointments including scheduling follow up with PCP; Assessed social determinant of health barriers;  Advised patient to schedule follow up with PCP and take all medications with her to the appointment  Hypertension: (Status: Goal on Track (progressing): YES.) Long Term Goal  Last practice recorded BP readings:  BP Readings from Last 3 Encounters:  09/26/22 113/80  09/08/22 123/71  07/27/22 (!) 148/84  Most recent eGFR/CrCl:  Lab Results  Component Value Date   EGFR 99 09/07/2022  No components found for: "CRCL"  Evaluation of current treatment plan related to hypertension self management and patient's adherence to plan as established by provider;   Provided education to patient re: stroke prevention, s/s of heart attack and stroke; Reviewed prescribed diet heart healthy Reviewed medications with patient and discussed importance of compliance;  Counseled on the importance of exercise goals with target of 150 minutes per week Reviewed  scheduled/upcoming provider appointments including:  Assessed social determinant of health barriers;    Patient Goals/Self-Care Activities: Take medications as prescribed   Attend all scheduled provider appointments Call provider office for new concerns or questions  keep all doctor appointments take medications for blood pressure exactly as prescribed eat more whole grains, fruits and vegetables, lean meats and healthy fats Decrease prepackaged foods which are high in sodium       Follow Up:  Patient agrees to Care Plan and Follow-up.  Plan: The Managed Medicaid care management team will reach out to the patient again over the next 60 days.  Date/time of next scheduled RN care management/care coordination outreach:  02/06/23 @ 12:30pm  Estanislado Emms RN, BSN [place]  Managed [facility] RN Care Coordinator [phone]

## 2022-12-05 ENCOUNTER — Inpatient Hospital Stay (HOSPITAL_COMMUNITY): Admission: RE | Admit: 2022-12-05 | Payer: Commercial Managed Care - HMO | Source: Ambulatory Visit

## 2022-12-05 ENCOUNTER — Ambulatory Visit (HOSPITAL_COMMUNITY): Payer: Commercial Managed Care - HMO

## 2022-12-05 ENCOUNTER — Telehealth: Payer: Self-pay

## 2022-12-05 NOTE — Telephone Encounter (Signed)
Hydrocodone-Acetaminophen 5/325 MG  Qty 11 Tablets  PATIENT USES [facility] ON [address] 

## 2022-12-06 MED ORDER — HYDROCODONE-ACETAMINOPHEN 5-325 MG PO TABS: ORAL_TABLET | ORAL | 0 refills | Status: DC

## 2022-12-12 ENCOUNTER — Telehealth: Payer: Self-pay

## 2022-12-12 ENCOUNTER — Telehealth: Payer: Self-pay | Admitting: Orthopaedic Surgery

## 2022-12-12 DIAGNOSIS — M545 Low back pain, unspecified: Secondary | ICD-10-CM | POA: Diagnosis not present

## 2022-12-12 DIAGNOSIS — J301 Allergic rhinitis due to pollen: Secondary | ICD-10-CM | POA: Diagnosis not present

## 2022-12-12 DIAGNOSIS — I10 Essential (primary) hypertension: Secondary | ICD-10-CM | POA: Diagnosis not present

## 2022-12-12 DIAGNOSIS — Z888 Allergy status to other drugs, medicaments and biological substances status: Secondary | ICD-10-CM | POA: Diagnosis not present

## 2022-12-12 DIAGNOSIS — M1990 Unspecified osteoarthritis, unspecified site: Secondary | ICD-10-CM | POA: Diagnosis not present

## 2022-12-12 DIAGNOSIS — R609 Edema, unspecified: Secondary | ICD-10-CM | POA: Diagnosis not present

## 2022-12-12 DIAGNOSIS — G40909 Epilepsy, unspecified, not intractable, without status epilepticus: Secondary | ICD-10-CM | POA: Diagnosis not present

## 2022-12-12 DIAGNOSIS — E559 Vitamin D deficiency, unspecified: Secondary | ICD-10-CM | POA: Diagnosis not present

## 2022-12-12 DIAGNOSIS — Z87891 Personal history of nicotine dependence: Secondary | ICD-10-CM | POA: Diagnosis not present

## 2022-12-12 DIAGNOSIS — K219 Gastro-esophageal reflux disease without esophagitis: Secondary | ICD-10-CM | POA: Diagnosis not present

## 2022-12-12 DIAGNOSIS — Z8249 Family history of ischemic heart disease and other diseases of the circulatory system: Secondary | ICD-10-CM | POA: Diagnosis not present

## 2022-12-12 DIAGNOSIS — F419 Anxiety disorder, unspecified: Secondary | ICD-10-CM | POA: Diagnosis not present

## 2022-12-12 MED ORDER — HYDROCODONE-ACETAMINOPHEN 5-325 MG PO TABS: ORAL_TABLET | ORAL | 0 refills | Status: DC

## 2022-12-12 NOTE — Telephone Encounter (Signed)
Complete

## 2022-12-12 NOTE — Telephone Encounter (Signed)
Dr. Sanjuan Dame pt - pt lvm requesting a refill on Hydrocodone be sent to [facility] on [address]

## 2022-12-19 ENCOUNTER — Telehealth: Payer: Self-pay | Admitting: Orthopaedic Surgery

## 2022-12-19 MED ORDER — HYDROCODONE-ACETAMINOPHEN 5-325 MG PO TABS: ORAL_TABLET | ORAL | 0 refills | Status: DC

## 2022-12-19 NOTE — Telephone Encounter (Signed)
Dr. Keeling's pt - pt lvm requesting a refill on Hydrocodone 5-325 to be sent to [facility] on [address] 

## 2022-12-22 ENCOUNTER — Other Ambulatory Visit: Payer: Medicaid Other | Admitting: Licensed Clinical Social Worker

## 2022-12-22 NOTE — Patient Instructions (Signed)
Judy Cross ,  ? ?The Medicaid Managed Care Team is available to provide assistance to you with your healthcare needs at no cost and as a benefit of your Medicaid Health plan. I'm sorry I was unable to reach you today for our scheduled appointment. Our care guide will call you to reschedule our telephone appointment. Please call me at the number below. I am available to be of assistance to you regarding your healthcare needs. .  ? ?Thank you,  ? ?Tiaunna Buford, BSW, MSW, LCSW ?Managed Medicaid LCSW ?[place]  [facility] ?[email or web address] ?Phone: [phone] ? ? ?

## 2022-12-22 NOTE — Patient Outreach (Signed)
  Medicaid Managed Care   Unsuccessful Attempt Note   12/22/2022 Name: Judy Cross MRN: [id number] DOB: 1965-10-26  Referred by: Anabel Halon, MD Reason for referral : No chief complaint on file.   An unsuccessful telephone outreach was attempted today. The patient was referred to the case management team for assistance with care management and care coordination.    Follow Up Plan: A HIPAA compliant phone message was left for the patient providing contact information and requesting a return call.   Dickie La, BSW, MSW, [employer] Managed Medicaid LCSW [facility]  [facility] [email or web address] Phone: [phone]

## 2022-12-25 ENCOUNTER — Ambulatory Visit (HOSPITAL_COMMUNITY): Payer: Medicaid Other | Admitting: Psychiatry

## 2022-12-25 ENCOUNTER — Encounter (HOSPITAL_COMMUNITY): Payer: Self-pay

## 2022-12-26 ENCOUNTER — Telehealth: Payer: Self-pay | Admitting: Orthopaedic Surgery

## 2022-12-26 MED ORDER — HYDROCODONE-ACETAMINOPHEN 5-325 MG PO TABS: ORAL_TABLET | ORAL | 0 refills | Status: DC

## 2022-12-26 NOTE — Addendum Note (Signed)
Addended byCaffie Damme on: 12/26/2022 08:27 AM   Modules accepted: Orders

## 2022-12-26 NOTE — Addendum Note (Signed)
Addended by: Earnstine Regal on: 12/26/2022 08:56 AM   Modules accepted: Orders

## 2022-12-26 NOTE — Telephone Encounter (Signed)
Dr. Sanjuan Dame pt - pt lvm requesting a refill on Hydrocodone 5-325 to be sent to [facility] on [address]

## 2023-01-02 ENCOUNTER — Telehealth: Payer: Self-pay | Admitting: Orthopaedic Surgery

## 2023-01-02 MED ORDER — HYDROCODONE-ACETAMINOPHEN 5-325 MG PO TABS: ORAL_TABLET | ORAL | 0 refills | Status: DC

## 2023-01-02 NOTE — Telephone Encounter (Signed)
Dr. Sanjuan Dame pt  - pt lvm requesting a refill for Hydrocodone 5-325 to be sent to [facility].

## 2023-01-02 NOTE — Telephone Encounter (Signed)
Dr. Sanjuan Dame pt - pt has called and lvm stating there is an issue w/her medication at the pharmacy.  She would like a call back [phone].

## 2023-01-02 NOTE — Telephone Encounter (Signed)
Dr. Sanjuan Cross pt - pt lvm requesting a refill for Hydrocodone 5-325

## 2023-01-02 NOTE — Telephone Encounter (Signed)
I called her back She said it was kicked out Pharmacy told her they were waiting on [place]. I called pharmacy They need Dr Jenetta Downer state controlled substances number I told her I have his DEA number but that's not what they need  Do you know his state controlled substances number?

## 2023-01-03 ENCOUNTER — Telehealth: Payer: Self-pay | Admitting: Orthopaedic Surgery

## 2023-01-03 ENCOUNTER — Other Ambulatory Visit: Payer: Self-pay

## 2023-01-03 MED ORDER — HYDROCODONE-ACETAMINOPHEN 5-325 MG PO TABS: ORAL_TABLET | ORAL | 0 refills | Status: DC

## 2023-01-03 NOTE — Telephone Encounter (Signed)
Dr. Sanjuan Dame pt - pt lvm for Amy to call her ASAP about her medication.  She said this was driving her crazy. [phone]

## 2023-01-03 NOTE — Telephone Encounter (Signed)
Dr Romeo Apple resent it for Dr Hilda Lias. I called her to advise.

## 2023-01-03 NOTE — Telephone Encounter (Signed)
Dr Hilda Lias does not have a controlled substances number I called pharmacy to advise. Left message

## 2023-01-03 NOTE — Telephone Encounter (Signed)
Hydrocodone-Acetaminophen 5/325 MG  Qty 11 Tablets One tablet every six hours for pain. Limit 5 days.   PATIENT USES [facility] ON [address]

## 2023-01-03 NOTE — Telephone Encounter (Signed)
I called patient to let her know the pharmacy needs his controlled substances number, I do not know it, Toniann Fail does not know it, I offered his DEA number but that's not what they need  Do you know your controlled substances number or who to ask since Toniann Fail does not know?

## 2023-01-03 NOTE — Telephone Encounter (Signed)
Dr Hilda Lias states you will renew his meds for patient since [facility] will not accept his prescriptions

## 2023-01-04 ENCOUNTER — Telehealth: Payer: Self-pay

## 2023-01-04 NOTE — Telephone Encounter (Signed)
..   Medicaid Managed Care   Unsuccessful Outreach Note  01/04/2023 Name: Judy Cross MRN: [id number] DOB: 1966/03/21  Referred by: Anabel Halon, MD Reason for referral : Appointment   A second unsuccessful telephone outreach was attempted today. The patient was referred to the case management team for assistance with care management and care coordination.   Follow Up Plan: A HIPAA compliant phone message was left for the patient providing contact information and requesting a return call.  The care management team will reach out to the patient again over the next 7 days.   Weston Settle Care Guide  [place] Managed  Care Guide [facility]  [phone]

## 2023-01-09 ENCOUNTER — Telehealth: Payer: Self-pay | Admitting: Orthopaedic Surgery

## 2023-01-09 MED ORDER — HYDROCODONE-ACETAMINOPHEN 5-325 MG PO TABS: ORAL_TABLET | ORAL | 0 refills | Status: DC

## 2023-01-09 NOTE — Telephone Encounter (Signed)
DR. Hilda Lias   Patient called left voicemail stating she needs refill on her pain medicine   HYDROcodone-acetaminophen (NORCO/VICODIN) 5-325 MG tablet   [facility] on [address] corner at [facility]

## 2023-01-15 ENCOUNTER — Encounter (HOSPITAL_COMMUNITY): Payer: Self-pay | Admitting: Physical Therapy

## 2023-01-15 NOTE — Therapy (Signed)
[facility] at [facility] [address] [place], [place], [address] Phone: [phone]   Fax:  [phone]  Patient Details  Name: Judy Cross MRN: [id number] Date of Birth: 1966/04/05 Referring Provider:  No ref. provider found  Encounter Date: 01/15/2023   PHYSICAL THERAPY DISCHARGE SUMMARY  Visits from Start of Care: 3  Current functional level related to goals / functional outcomes: Unknown as patient has not returned   Remaining deficits: Patient has not returned   Education / Equipment: HEP   Patient agrees to discharge. Patient goals were not met. Patient is being discharged due to not returning since the last visit.  2:22 PM, 01/15/23 Wyman Songster PT, DPT Physical Therapist at [facility]    [facility] Out[facility] Rehabilitation at [facility] [address] [place], [place], [address] Phone: [phone]   Fax:  [phone]

## 2023-01-16 ENCOUNTER — Telehealth: Payer: Self-pay | Admitting: Orthopaedic Surgery

## 2023-01-16 MED ORDER — HYDROCODONE-ACETAMINOPHEN 5-325 MG PO TABS: ORAL_TABLET | ORAL | 0 refills | Status: DC

## 2023-01-16 NOTE — Telephone Encounter (Signed)
Dr. Sanjuan Dame pt - pt lvm requesting a refill on her Hydrocodone 5-325 to be sent to [facility] on [address]

## 2023-01-18 ENCOUNTER — Other Ambulatory Visit: Payer: Medicaid Other | Admitting: Licensed Clinical Social Worker

## 2023-01-18 ENCOUNTER — Ambulatory Visit: Payer: Medicaid Other | Admitting: Licensed Clinical Social Worker

## 2023-01-18 NOTE — Patient Outreach (Signed)
  Medicaid Managed Care   Unsuccessful Attempt Note   01/18/2023 Name: Judy Cross MRN: [id number] DOB: 1965-08-08  Referred by: Anabel Halon, MD Reason for referral : No chief complaint on file.   Third unsuccessful telephone outreach was attempted today. The patient was referred to the case management team for assistance with care management and care coordination. The patient's primary care provider has been notified of our unsuccessful attempts to make or maintain contact with the patient. The care management team is pleased to engage with this patient at any time in the future should he/she be interested in assistance from the care management team.    Follow Up Plan: A HIPAA compliant phone message was left for the patient providing contact information and requesting a return call. and The Managed Medicaid care management team is available to follow up with the patient after provider conversation with the patient regarding recommendation for care management engagement and subsequent re-referral to the care management team.    Dickie La, BSW, MSW, LCSW Managed Medicaid LCSW [facility]  [email or web address] Phone: [phone]

## 2023-01-18 NOTE — Patient Instructions (Signed)
Judy Cross ,   The [facility] Managed Care Team is available to provide assistance to you with your healthcare needs at no cost and as a benefit of your [facility] Health plan. We have been unable to reach you on 3 separate attempts. The care management team is available to assist with your healthcare needs at any time. Please do not hesitate to contact me at the number below. .   Thank you,   Dickie La, BSW, MSW, LCSW Managed Medicaid LCSW [facility]  [email or web address] Phone: [phone]

## 2023-01-23 ENCOUNTER — Telehealth: Payer: Self-pay | Admitting: Orthopaedic Surgery

## 2023-01-23 MED ORDER — HYDROCODONE-ACETAMINOPHEN 5-325 MG PO TABS: ORAL_TABLET | ORAL | 0 refills | Status: DC

## 2023-01-23 NOTE — Telephone Encounter (Signed)
Dr. Sanjuan Dame pt - pt lvm requesting a refill on Hydrocodone 5-325 to be sent to [facility] on [address]

## 2023-01-30 ENCOUNTER — Other Ambulatory Visit: Payer: Self-pay

## 2023-01-30 MED ORDER — HYDROCODONE-ACETAMINOPHEN 5-325 MG PO TABS: ORAL_TABLET | ORAL | 0 refills | Status: DC

## 2023-01-30 NOTE — Telephone Encounter (Signed)
Dr. Hilda Lias pt----Hydrocodone-Acetaminophen 5/325 MG  Qty 11 Tablets  One tablet every six hours for pain. Limit 5 days.   PATIENT USES [facility] ON [address]

## 2023-02-05 ENCOUNTER — Telehealth (HOSPITAL_COMMUNITY): Payer: Self-pay

## 2023-02-05 NOTE — Telephone Encounter (Signed)
Called pt 02/05/23 at 2:59PM to confirm 02/08/23 appt no answer left vm

## 2023-02-06 ENCOUNTER — Other Ambulatory Visit: Payer: Medicaid Other | Admitting: *Deleted

## 2023-02-06 ENCOUNTER — Telehealth: Payer: Self-pay | Admitting: Orthopaedic Surgery

## 2023-02-06 MED ORDER — HYDROCODONE-ACETAMINOPHEN 5-325 MG PO TABS: ORAL_TABLET | ORAL | 0 refills | Status: DC

## 2023-02-06 NOTE — Patient Outreach (Signed)
  Medicaid Managed Care   Unsuccessful Attempt Note   02/06/2023 Name: Judy Cross MRN: [id number] DOB: Nov 21, 1965  Referred by: Anabel Halon, MD Reason for referral : High Risk Managed Medicaid (Unsuccessful RNCM follow up telephone outreach)   Third unsuccessful telephone outreach was attempted today. The patient was referred to the case management team for assistance with care management and care coordination. The patient's primary care provider has been notified of our unsuccessful attempts to make or maintain contact with the patient. The care management team is pleased to engage with this patient at any time in the future should he/she be interested in assistance from the care management team.    Follow Up Plan: The Managed Medicaid care management team is available to follow up with the patient after provider conversation with the patient regarding recommendation for care management engagement and subsequent re-referral to the care management team.     Estanislado Emms RN, BSN [place]  Value-Based [facility] RN Care Coordinator [phone]

## 2023-02-06 NOTE — Telephone Encounter (Signed)
Dr. Sanjuan Dame pt - pt lvm requesting a refill for Hydrocodone 5-325 to be sent to [facility] on [address]

## 2023-02-06 NOTE — Telephone Encounter (Signed)
Called pt vm left at 1:21 PM 02/06/23 to call office by 12:00 tomorrow for 02/08/23 appt

## 2023-02-08 ENCOUNTER — Encounter (HOSPITAL_COMMUNITY): Payer: Self-pay

## 2023-02-08 ENCOUNTER — Ambulatory Visit (HOSPITAL_COMMUNITY): Payer: Medicaid Other | Admitting: Psychiatry

## 2023-02-13 ENCOUNTER — Telehealth: Payer: Self-pay | Admitting: Orthopaedic Surgery

## 2023-02-13 MED ORDER — HYDROCODONE-ACETAMINOPHEN 5-325 MG PO TABS: ORAL_TABLET | ORAL | 0 refills | Status: DC

## 2023-02-13 NOTE — Telephone Encounter (Signed)
Dr. Sanjuan Dame pt - pt lvm requesting a refill on Hydrocodone 5-325 to be sent to [facility] on [address]

## 2023-02-20 ENCOUNTER — Telehealth: Payer: Self-pay | Admitting: Orthopaedic Surgery

## 2023-02-20 MED ORDER — HYDROCODONE-ACETAMINOPHEN 5-325 MG PO TABS: ORAL_TABLET | ORAL | 0 refills | Status: DC

## 2023-02-20 NOTE — Telephone Encounter (Signed)
Patient called and lvm it's time to renew her pain medicine today.    HYDROcodone-acetaminophen (NORCO/VICODIN) 5-325 MG tablet   Pharmacy [facility]

## 2023-02-27 ENCOUNTER — Telehealth: Payer: Self-pay | Admitting: Orthopaedic Surgery

## 2023-02-27 MED ORDER — HYDROCODONE-ACETAMINOPHEN 5-325 MG PO TABS: ORAL_TABLET | ORAL | 0 refills | Status: DC

## 2023-02-27 NOTE — Telephone Encounter (Signed)
Dr. Sanjuan Dame pt - pt lvm requesting a refill on Hydrocodone 5-325 to be sent to [facility] on [address]

## 2023-03-06 ENCOUNTER — Telehealth: Payer: Self-pay | Admitting: Orthopaedic Surgery

## 2023-03-06 MED ORDER — HYDROCODONE-ACETAMINOPHEN 5-325 MG PO TABS: ORAL_TABLET | ORAL | 0 refills | Status: DC

## 2023-03-06 NOTE — Telephone Encounter (Signed)
Dr. Sanjuan Dame pt - spoke w/the pt, she is requesting a refill on her Hydrocodone 5-325 to be sent to [facility] on [address]

## 2023-03-13 ENCOUNTER — Telehealth: Payer: Self-pay | Admitting: Orthopaedic Surgery

## 2023-03-13 ENCOUNTER — Other Ambulatory Visit: Payer: Self-pay | Admitting: Internal Medicine

## 2023-03-13 DIAGNOSIS — E559 Vitamin D deficiency, unspecified: Secondary | ICD-10-CM

## 2023-03-13 MED ORDER — HYDROCODONE-ACETAMINOPHEN 5-325 MG PO TABS: ORAL_TABLET | ORAL | 0 refills | Status: DC

## 2023-03-13 NOTE — Telephone Encounter (Signed)
Dr. Sanjuan Dame pt - pt lvm stating it's time for her refill on Hydrocodone 5-325, she'd like it sent to [facility] on [address]

## 2023-03-20 ENCOUNTER — Telehealth: Payer: Self-pay | Admitting: Orthopaedic Surgery

## 2023-03-20 MED ORDER — HYDROCODONE-ACETAMINOPHEN 5-325 MG PO TABS: ORAL_TABLET | ORAL | 0 refills | Status: DC

## 2023-03-20 NOTE — Telephone Encounter (Signed)
Dr. Sanjuan Dame pt - pt lvm requesting a refill on her Hydrocodone 5-325 to be sent to [facility] on [address]

## 2023-03-27 ENCOUNTER — Telehealth: Payer: Self-pay | Admitting: Orthopaedic Surgery

## 2023-03-27 MED ORDER — HYDROCODONE-ACETAMINOPHEN 5-325 MG PO TABS: ORAL_TABLET | ORAL | 0 refills | Status: DC

## 2023-03-27 NOTE — Telephone Encounter (Signed)
Dr. Sanjuan Dame pt - pt lvm requesting a refill on Hydrocodone 5-325 to be sent to [facility] on [address]

## 2023-04-03 ENCOUNTER — Telehealth: Payer: Self-pay | Admitting: Orthopaedic Surgery

## 2023-04-03 MED ORDER — HYDROCODONE-ACETAMINOPHEN 5-325 MG PO TABS: ORAL_TABLET | ORAL | 0 refills | Status: DC

## 2023-04-03 NOTE — Telephone Encounter (Signed)
Dr. Sanjuan Dame pt - pt lvm requesting a refill on her Hydrocodone 5-325 to be sent to [facility] on [address]

## 2023-04-10 ENCOUNTER — Telehealth: Payer: Self-pay | Admitting: Orthopaedic Surgery

## 2023-04-10 MED ORDER — HYDROCODONE-ACETAMINOPHEN 5-325 MG PO TABS: ORAL_TABLET | ORAL | 0 refills | Status: DC

## 2023-04-10 NOTE — Telephone Encounter (Signed)
Dr. Sanjuan Dame pt - spoke w/the patient, she is requesting a refill on Hydrocodone 5-325 to be sent to [facility] on [address]

## 2023-04-17 ENCOUNTER — Other Ambulatory Visit: Payer: Self-pay | Admitting: Orthopaedic Surgery

## 2023-04-17 MED ORDER — HYDROCODONE-ACETAMINOPHEN 5-325 MG PO TABS: ORAL_TABLET | ORAL | 0 refills | Status: DC

## 2023-04-17 NOTE — Telephone Encounter (Signed)
Patient is calling back again, wanting to know if her medication will be called in today.

## 2023-04-17 NOTE — Telephone Encounter (Signed)
Dr. Sanjuan Dame pt - spoke w/the patient, she is requesting a refill on her Hydrocodone 5-325, 11 tablets, as directed to be sent to [facility] on [address]

## 2023-04-24 ENCOUNTER — Telehealth: Payer: Self-pay | Admitting: Orthopaedic Surgery

## 2023-04-24 MED ORDER — HYDROCODONE-ACETAMINOPHEN 5-325 MG PO TABS: ORAL_TABLET | ORAL | 0 refills | Status: DC

## 2023-04-24 NOTE — Telephone Encounter (Signed)
Dr. Sanjuan Dame pt - pt called back about her medication, she was very rude.  She said there's always a problem, either with the pharmacy or [place].  I advised Dr. Hilda Lias will be back in the office tomorrow.  She wanted to know what the problem was.

## 2023-04-24 NOTE — Telephone Encounter (Signed)
Dr. Sanjuan Dame pt - pt lvm requesting a refill on Hydrocodone 5-325 to be sent to [facility] on [address]

## 2023-05-01 ENCOUNTER — Telehealth: Payer: Self-pay | Admitting: Orthopaedic Surgery

## 2023-05-01 MED ORDER — HYDROCODONE-ACETAMINOPHEN 5-325 MG PO TABS: ORAL_TABLET | ORAL | 0 refills | Status: DC

## 2023-05-01 NOTE — Telephone Encounter (Signed)
I spoke w/this patient this morning.  Again she was rude, cussing and fussing about her medication.  I see it has been called in.  She stated it is at the pharmacy, but she can't pick it up until tomorrow because she didn't get it until last Wednesday so the pharmacy will not let her get it today.  She scheduled an appointment for tomorrow for knee pain.  This is FYI for you.

## 2023-05-01 NOTE — Telephone Encounter (Signed)
DR.  Hilda Lias   Patient called at 7:54 AM left message request refill on his pain medicine    HYDROcodone-acetaminophen (NORCO/VICODIN) 5-325 MG tablet   Pharmacy:  [facility]

## 2023-05-02 ENCOUNTER — Encounter: Payer: Self-pay | Admitting: Orthopaedic Surgery

## 2023-05-02 ENCOUNTER — Ambulatory Visit (INDEPENDENT_AMBULATORY_CARE_PROVIDER_SITE_OTHER): Payer: Medicaid Other | Admitting: Orthopaedic Surgery

## 2023-05-02 VITALS — BP 167/101 | HR 86

## 2023-05-02 DIAGNOSIS — G8929 Other chronic pain: Secondary | ICD-10-CM | POA: Diagnosis not present

## 2023-05-02 DIAGNOSIS — M25562 Pain in left knee: Secondary | ICD-10-CM

## 2023-05-02 MED ORDER — PREDNISONE 5 MG (21) PO TBPK
ORAL_TABLET | ORAL | 0 refills | Status: DC
Start: 2023-05-02 — End: 2023-07-18

## 2023-05-02 NOTE — Progress Notes (Signed)
My knee hurts.  She has had more pain with the left knee and posterior swelling at times.  She has no trauma, no giving way, no redness, no numbness.  Left knee has slight effusion and slight Baker's cyst posteriorly.  NV intact.  ROM is full.  No distal edema is present.  Encounter Diagnosis  Name Primary?   Chronic pain of left knee Yes   I had renewed her pain medicine yesterday.  I will call in prednisone dose pack.  Return in three months.  Call if any problem.  Precautions discussed.  Electronically Signed Darreld Mclean, MD 11/20/20249:44 AM

## 2023-05-08 ENCOUNTER — Telehealth: Payer: Self-pay | Admitting: Orthopaedic Surgery

## 2023-05-08 MED ORDER — HYDROCODONE-ACETAMINOPHEN 5-325 MG PO TABS: ORAL_TABLET | ORAL | 0 refills | Status: DC

## 2023-05-08 NOTE — Telephone Encounter (Signed)
Dr. Sanjuan Dame pt - spoke w/the pt, she is requesting a refill on Hydrocodone 5-325 to be sent to [facility] on [address]

## 2023-05-16 ENCOUNTER — Telehealth: Payer: Self-pay | Admitting: Orthopaedic Surgery

## 2023-05-16 MED ORDER — HYDROCODONE-ACETAMINOPHEN 5-325 MG PO TABS: ORAL_TABLET | ORAL | 0 refills | Status: DC

## 2023-05-16 NOTE — Telephone Encounter (Signed)
DR. Hilda Lias   Patient called at 7:38 am left voicemail stating she needs her pain medicine filled    HYDROcodone-acetaminophen (NORCO/VICODIN) 5-325 MG tablet     [facility] on [address]

## 2023-05-23 ENCOUNTER — Telehealth: Payer: Self-pay | Admitting: Orthopaedic Surgery

## 2023-05-23 MED ORDER — HYDROCODONE-ACETAMINOPHEN 5-325 MG PO TABS: ORAL_TABLET | ORAL | 0 refills | Status: DC

## 2023-05-23 NOTE — Telephone Encounter (Signed)
Dr. Sanjuan Dame pt - pt lvm requesting a refill for Hydrocodone 5-325 to be sent to [facility] on [address]

## 2023-05-30 ENCOUNTER — Telehealth: Payer: Self-pay | Admitting: Orthopaedic Surgery

## 2023-05-30 MED ORDER — HYDROCODONE-ACETAMINOPHEN 5-325 MG PO TABS: ORAL_TABLET | ORAL | 0 refills | Status: DC

## 2023-05-30 NOTE — Telephone Encounter (Signed)
Dr. Sanjuan Dame pt - spoke w/the patient, she is requesting a refill on Hydrocodone 5-325 to be sent to [facility] on [address]

## 2023-06-04 ENCOUNTER — Other Ambulatory Visit: Payer: Self-pay

## 2023-06-04 MED ORDER — HYDROCODONE-ACETAMINOPHEN 5-325 MG PO TABS: ORAL_TABLET | ORAL | 0 refills | Status: DC

## 2023-06-04 NOTE — Telephone Encounter (Signed)
Hydrocodone-Acetaminophen 5/325 MG  Qty 11 Tablets One tablet every six hours for pain. Limit 5 days.   PATIENT USES [facility] ON [address]

## 2023-06-11 ENCOUNTER — Other Ambulatory Visit: Payer: Self-pay | Admitting: Orthopaedic Surgery

## 2023-06-11 MED ORDER — HYDROCODONE-ACETAMINOPHEN 5-325 MG PO TABS: ORAL_TABLET | ORAL | 0 refills | Status: DC

## 2023-06-11 NOTE — Telephone Encounter (Signed)
Dr. Sanjuan Dame pt - pt lvm requesting a refill on Hydrocodone 5-325, 11 tablets, One tablet every six hours for pain.  Limit 5 days to be sent to [facility] on [address]

## 2023-06-16 ENCOUNTER — Emergency Department (HOSPITAL_COMMUNITY)
Admission: EM | Admit: 2023-06-16 | Discharge: 2023-06-16 | Disposition: A | Discharge status: Home / Self Care | Payer: Medicaid Other | Attending: Emergency Medicine | Admitting: Emergency Medicine

## 2023-06-16 ENCOUNTER — Encounter (HOSPITAL_COMMUNITY): Payer: Self-pay

## 2023-06-16 ENCOUNTER — Other Ambulatory Visit: Payer: Self-pay

## 2023-06-16 DIAGNOSIS — Z7951 Long term (current) use of inhaled steroids: Secondary | ICD-10-CM | POA: Insufficient documentation

## 2023-06-16 DIAGNOSIS — Z79899 Other long term (current) drug therapy: Secondary | ICD-10-CM | POA: Diagnosis not present

## 2023-06-16 DIAGNOSIS — K0889 Other specified disorders of teeth and supporting structures: Secondary | ICD-10-CM | POA: Diagnosis not present

## 2023-06-16 DIAGNOSIS — I10 Essential (primary) hypertension: Secondary | ICD-10-CM | POA: Diagnosis not present

## 2023-06-16 DIAGNOSIS — J45909 Unspecified asthma, uncomplicated: Secondary | ICD-10-CM | POA: Diagnosis not present

## 2023-06-16 MED ORDER — OXYCODONE-ACETAMINOPHEN 5-325 MG PO TABS
1.0000 | ORAL_TABLET | Freq: Once | ORAL | Status: AC
  Administered 2023-06-16: 1 via ORAL
  Filled 2023-06-16: qty 1

## 2023-06-16 MED ORDER — AMOXICILLIN-POT CLAVULANATE 875-125 MG PO TABS
1.0000 | ORAL_TABLET | Freq: Once | ORAL | Status: AC
  Administered 2023-06-16: 1 via ORAL
  Filled 2023-06-16: qty 1

## 2023-06-16 MED ORDER — AMOXICILLIN-POT CLAVULANATE 875-125 MG PO TABS: 1.0000 | ORAL_TABLET | Freq: Two times a day (BID) | ORAL | 0 refills | Status: AC

## 2023-06-16 NOTE — ED Notes (Signed)
AVS with prescriptions provided to and discussed with patient. Pt verbalizes understanding of discharge instructions and denies any questions or concerns at this time. Pt has ride home. Pt ambulated out of department independently with steady gait.

## 2023-06-16 NOTE — Discharge Instructions (Addendum)
 It was a pleasure caring for you.  You will need to follow-up with your dentist next week.  Please seek emergency care if experiencing any new or worsening symptoms.  Alternating between 650 mg Tylenol  and 400 mg Advil : The best way to alternate taking Acetaminophen  (example Tylenol ) and Ibuprofen  (example Advil /Motrin ) is to take them 3 hours apart. For example, if you take ibuprofen  at 6 am you can then take Tylenol  at 9 am. You can continue this regimen throughout the day, making sure you do not exceed the recommended maximum dose for each drug.

## 2023-06-16 NOTE — ED Triage Notes (Signed)
 Pt presents to ED from home C/O L lower dental pain X 3 days. Pt reports loose tooth.

## 2023-06-16 NOTE — ED Provider Notes (Signed)
 [place] EMERGENCY DEPARTMENT AT [facility] Provider Note   CSN: [id number] Arrival date & time: 06/16/23  0845     History  Chief Complaint  Patient presents with   Dental Pain    Judy Cross is a 58 y.o. female with PMHx anxiety, asthma, arthritis, depression, HTN, seizures who presents to ED concerned for left lower dental pain x3 days. Denies trauma to this area. Patient follows with [facility] dental clinic. Patient stating that she called them 2 days ago but has not heard back from them yet. Patient also stating that she feels some swelling on the left side of her face. Patient declining any other infectious symptoms today.   Dental Pain      Home Medications Prior to Admission medications   Medication Sig Start Date End Date Taking? Authorizing Provider  hydrochlorothiazide  (HYDRODIURIL ) 25 MG tablet Take 1 tablet (25 mg total) by mouth daily. 07/27/22   Tobie Suzzane POUR, MD  HYDROcodone -acetaminophen  (NORCO/VICODIN) 5-325 MG tablet One tablet every six hours for pain.  Limit 5 days. 06/11/23   Margrette Taft BRAVO, MD  levETIRAcetam  (KEPPRA ) 750 MG tablet Take 1 tablet (750 mg total) by mouth 2 (two) times daily. 07/11/22   Camara, Amadou, MD  levocetirizine (XYZAL ) 5 MG tablet Take 5 mg by mouth daily. 10/05/22   [provider]  predniSONE  (STERAPRED UNI-PAK 21 TAB) 5 MG (21) TBPK tablet Take 6 pills first day; 5 pills second day; 4 pills third day; 3 pills fourth day; 2 pills next day and 1 pill last day. 05/02/23   Brenna Lin, MD  sertraline  (ZOLOFT ) 50 MG tablet Take 1 tablet (50 mg total) by mouth daily. 07/27/22   Tobie Suzzane POUR, MD      Allergies    Lisinopril, Penicillins, and Tramadol     Review of Systems   Review of Systems  HENT:  Positive for dental problem.     Physical Exam Updated Vital Signs BP 125/82 (BP Location: Right Arm)   Pulse 67   Temp 98.3 F (36.8 C) (Oral)   Resp 16   Ht 5' 1 (1.549 m)   Wt 64.9 kg   SpO2  98%   BMI 27.02 kg/m  Physical Exam Vitals and nursing note reviewed.  Constitutional:      General: She is not in acute distress.    Appearance: She is not ill-appearing or toxic-appearing.  HENT:     Head: Normocephalic and atraumatic.     Mouth/Throat:     Mouth: Mucous membranes are moist.     Pharynx: Oropharynx is clear. No oropharyngeal exudate or posterior oropharyngeal erythema.     Comments: Multiple missing molar teeth. Left lower molar tooth appears loose. Mild swelling of the external left mandibular area. No oropharyngeal lesions appreciated. Eyes:     General: No scleral icterus.       Right eye: No discharge.        Left eye: No discharge.     Conjunctiva/sclera: Conjunctivae normal.  Cardiovascular:     Rate and Rhythm: Normal rate.  Pulmonary:     Effort: Pulmonary effort is normal.  Abdominal:     General: Abdomen is flat.  Skin:    General: Skin is warm and dry.  Neurological:     General: No focal deficit present.     Mental Status: She is alert. Mental status is at baseline.  Psychiatric:        Mood and Affect: Mood normal.  Behavior: Behavior normal.     ED Results / Procedures / Treatments   Labs (all labs ordered are listed, but only abnormal results are displayed) Labs Reviewed - No data to display  EKG None  Radiology No results found.  Procedures Procedures    Medications Ordered in ED Medications  amoxicillin -clavulanate (AUGMENTIN ) 875-125 MG per tablet 1 tablet (1 tablet Oral Given 06/16/23 0933)  oxyCODONE -acetaminophen  (PERCOCET/ROXICET) 5-325 MG per tablet 1 tablet (1 tablet Oral Given 06/16/23 0933)    ED Course/ Medical Decision Making/ A&P                                 Medical Decision Making   This patient presents to the ED for concern of dental pain, this involves an extensive number of treatment options, and is a complaint that carries with it a high risk of complications and morbidity.  The differential  diagnosis includes deep space infection   Co morbidities that complicate the patient evaluation   anxiety, asthma, arthritis, depression, HTN, seizures   Additional history obtained:  Dr. Tobie PCP   Problem List / ED Course / Critical interventions / Medication management  Patient presents to ED concerned for dental abscess.  Physical exam showing multiple missing teeth and loose appearing tooth in the left lower molar area. There is very mild/minimal swelling of the left lower exterior mandibular area. No oropharyngeal swelling. Patient able to tolerate PO intake. No dysphonia appreciated. Patient afebrile with stable vitals. Provided patient with a dose of Augmentin  in ED and with discharge with a 7 day course of Augmentin . Patient tolerated Augmentin  well. Patient with [facility] allergy listed in chart with rash as side effect - patient was monitored in ED without obvious side effects to Augmentin . Provided patient with list for dentist in the area. Educated patient that symptoms will return and may become more complicated if dentist follow up is not obtained. Patient verbally endorsed understanding of plan. Patient stating that she does have a dentist and will reach out to them again. I have reviewed the patients home medicines and have made adjustments as needed Patient afebrile with stable vitals.  Provided with return precautions.  Discharged in good condition.  Ddx: these are considered less likely due to history of present illness and physical exam findings -Deep space infection of head/neck: Denies dyspnea, dysphagia, trismus -Sepsis: patient afebrile without tachycardia or hypotension; no leukocytosis -Endocarditis: patient afebrile with stable vitals; patient denies HA, SOB, CP, cough; no skin findings on physical exam   Social Determinants of Health:  none   This note has been dictated using Teaching laboratory technician. Unfortunately, this method of dictation can  sometimes lead to typographical or grammatical errors. I apologize for your inconvenience in advance if this occurs. Please do not hesitate to reach out to me if clarification is needed.          Final Clinical Impression(s) / ED Diagnoses Final diagnoses:  Pain, dental    Rx / DC Orders ED Discharge Orders     None         Hoy Nidia FALCON, [place] 06/16/23 9060    Randol Simmonds, MD 06/16/23 1729

## 2023-06-18 ENCOUNTER — Telehealth: Payer: Self-pay | Admitting: Orthopaedic Surgery

## 2023-06-18 MED ORDER — HYDROCODONE-ACETAMINOPHEN 5-325 MG PO TABS: ORAL_TABLET | ORAL | 0 refills | Status: DC

## 2023-06-18 NOTE — Telephone Encounter (Signed)
 Dr. Sanjuan Dame pt - pt lvm stating it was time for her refill on Hydrocodone 5-325, 11 tablets, One tablet every six hours for pain.  Limit 5 days to be sent to [facility] on [address]

## 2023-06-25 ENCOUNTER — Telehealth: Payer: Self-pay | Admitting: Orthopaedic Surgery

## 2023-06-25 MED ORDER — HYDROCODONE-ACETAMINOPHEN 5-325 MG PO TABS: ORAL_TABLET | ORAL | 0 refills | Status: DC

## 2023-06-25 NOTE — Telephone Encounter (Signed)
Dr. Sanjuan Dame pt - pt lvm requesting a refill for Hydrocodone 5-325 to be sent to [facility] on [address]

## 2023-07-03 ENCOUNTER — Telehealth: Payer: Self-pay | Admitting: Orthopaedic Surgery

## 2023-07-03 MED ORDER — HYDROCODONE-ACETAMINOPHEN 5-325 MG PO TABS: ORAL_TABLET | ORAL | 0 refills | Status: DC

## 2023-07-03 NOTE — Addendum Note (Signed)
Addended by: Earnstine Regal on: 07/03/2023 10:25 AM   Modules accepted: Orders

## 2023-07-03 NOTE — Telephone Encounter (Signed)
Dr. Sanjuan Dame pt - pt lvm requesting a refill on Hydrocodone 5-325 to be sent to [facility] on [address]

## 2023-07-03 NOTE — Addendum Note (Signed)
Addended by: Signa Kell on: 07/03/2023 08:28 AM   Modules accepted: Orders

## 2023-07-10 ENCOUNTER — Telehealth: Payer: Self-pay | Admitting: Orthopaedic Surgery

## 2023-07-10 NOTE — Telephone Encounter (Signed)
Dr. Sanjuan Dame pt - pt lvm requesting a refill on Hydrocodone 5-325 to be sent to [facility] on [address]

## 2023-07-11 MED ORDER — HYDROCODONE-ACETAMINOPHEN 5-325 MG PO TABS: ORAL_TABLET | ORAL | 0 refills | Status: DC

## 2023-07-18 ENCOUNTER — Encounter: Payer: Self-pay | Admitting: Orthopaedic Surgery

## 2023-07-18 ENCOUNTER — Ambulatory Visit (INDEPENDENT_AMBULATORY_CARE_PROVIDER_SITE_OTHER): Payer: 59 | Admitting: Orthopaedic Surgery

## 2023-07-18 VITALS — BP 130/85 | HR 83 | Ht 61.0 in | Wt 121.0 lb

## 2023-07-18 DIAGNOSIS — M5442 Lumbago with sciatica, left side: Secondary | ICD-10-CM

## 2023-07-18 DIAGNOSIS — M25562 Pain in left knee: Secondary | ICD-10-CM | POA: Diagnosis not present

## 2023-07-18 DIAGNOSIS — G8929 Other chronic pain: Secondary | ICD-10-CM | POA: Diagnosis not present

## 2023-07-18 MED ORDER — METHYLPREDNISOLONE ACETATE 40 MG/ML IJ SUSP
40.0000 mg | Freq: Once | INTRAMUSCULAR | Status: AC
  Administered 2023-07-18: 40 mg via INTRA_ARTICULAR

## 2023-07-18 MED ORDER — HYDROCODONE-ACETAMINOPHEN 5-325 MG PO TABS: ORAL_TABLET | ORAL | 0 refills | Status: DC

## 2023-07-18 NOTE — Progress Notes (Signed)
 PROCEDURE NOTE:  The patient requests injections of the left knee , verbal consent was obtained.  The left knee was prepped appropriately after time out was performed.   Sterile technique was observed and injection of 1 cc of DepoMedrol 40 mg with several cc's of plain xylocaine . Anesthesia was provided by ethyl chloride and a 20-gauge needle was used to inject the knee area. The injection was tolerated well.  A band aid dressing was applied.  The patient was advised to apply ice later today and tomorrow to the injection sight as needed.  Encounter Diagnoses  Name Primary?   Chronic pain of left knee Yes   Chronic midline low back pain with left-sided sciatica    She has lower back pain.  I will see her in April and see how she is doing.  She may need new MRI.  Call if any problem.  Precautions discussed.  Electronically Signed Lemond Stable, MD 2/5/20258:43 AM

## 2023-07-25 ENCOUNTER — Telehealth: Payer: Self-pay | Admitting: Orthopaedic Surgery

## 2023-07-25 ENCOUNTER — Telehealth: Payer: Self-pay

## 2023-07-25 NOTE — Telephone Encounter (Signed)
Dr. Sanjuan Dame pt - pt lvm requesting a refill on Hydrocodone 5-325 to be sent to [facility]

## 2023-07-25 NOTE — Telephone Encounter (Signed)
Called patient to let her know that I sent her script over to Dr Hilda Lias this morning and he had already left for the day and will have it done tomorrow and she said "OMG I have pain and can't get my meds whatever Bye Bye" and hung up on me. She was real rude

## 2023-07-26 MED ORDER — HYDROCODONE-ACETAMINOPHEN 5-325 MG PO TABS: ORAL_TABLET | ORAL | 0 refills | Status: DC

## 2023-07-29 ENCOUNTER — Emergency Department (HOSPITAL_COMMUNITY)
Admission: EM | Admit: 2023-07-29 | Discharge: 2023-07-29 | Disposition: A | Discharge status: Home / Self Care | Payer: 59 | Attending: Emergency Medicine | Admitting: Emergency Medicine

## 2023-07-29 ENCOUNTER — Other Ambulatory Visit: Payer: Self-pay

## 2023-07-29 ENCOUNTER — Emergency Department (HOSPITAL_COMMUNITY): Payer: 59

## 2023-07-29 DIAGNOSIS — M25552 Pain in left hip: Secondary | ICD-10-CM | POA: Diagnosis not present

## 2023-07-29 DIAGNOSIS — I10 Essential (primary) hypertension: Secondary | ICD-10-CM | POA: Diagnosis not present

## 2023-07-29 DIAGNOSIS — Z79899 Other long term (current) drug therapy: Secondary | ICD-10-CM | POA: Insufficient documentation

## 2023-07-29 DIAGNOSIS — M25512 Pain in left shoulder: Secondary | ICD-10-CM | POA: Insufficient documentation

## 2023-07-29 DIAGNOSIS — Y9301 Activity, walking, marching and hiking: Secondary | ICD-10-CM | POA: Insufficient documentation

## 2023-07-29 DIAGNOSIS — G8911 Acute pain due to trauma: Secondary | ICD-10-CM | POA: Diagnosis not present

## 2023-07-29 DIAGNOSIS — J45909 Unspecified asthma, uncomplicated: Secondary | ICD-10-CM | POA: Diagnosis not present

## 2023-07-29 DIAGNOSIS — S8002XA Contusion of left knee, initial encounter: Secondary | ICD-10-CM | POA: Diagnosis present

## 2023-07-29 DIAGNOSIS — M545 Low back pain, unspecified: Secondary | ICD-10-CM | POA: Insufficient documentation

## 2023-07-29 DIAGNOSIS — W1839XA Other fall on same level, initial encounter: Secondary | ICD-10-CM | POA: Diagnosis not present

## 2023-07-29 MED ORDER — KETOROLAC TROMETHAMINE 15 MG/ML IJ SOLN
15.0000 mg | Freq: Once | INTRAMUSCULAR | Status: AC
  Administered 2023-07-29: 15 mg via INTRAVENOUS
  Filled 2023-07-29: qty 1

## 2023-07-29 MED ORDER — LIDOCAINE 5 % EX PTCH: 1.0000 | MEDICATED_PATCH | CUTANEOUS | 0 refills | Status: AC

## 2023-07-29 NOTE — Discharge Instructions (Addendum)
You are seen in the ER for pain in your left side after a fall.  Fortunately your x-rays were all normal.  We give you a knee brace to help with pain in your knee.  You can take over-the-counter Tylenol and ibuprofen as instructed on packaging.  Prescribed lidocaine patches to use for your shoulder and/or hip as well.  These follow-up with your PCP and/or orthopedics and come back to the ER for new or worsening symptoms.

## 2023-07-29 NOTE — ED Provider Notes (Signed)
[place] EMERGENCY DEPARTMENT AT [facility] Provider Note   CSN: [id number] Arrival date & time: 07/29/23  0749     History  Chief Complaint  Patient presents with   [facility] MELAINA HOWERTON is a 58 y.o. female. Has PMH of hypertension, asthma, chronic low back pain, seizures.  Presents to ER with complaints of pain to the left side after a fall 2 days ago.  States she is walking with her cane and her left knee "locked up" and she fell to her left side, denies head injury or loss of consciousness.  She is not on blood thinners.  She states she hit her left hand, left shoulder left knee, left hip is also having pain in the left low back.  She has been taking Tylenol without relief at home, denies numbness tingling or weakness, did not get dizzy before the fall, states she has had problems with that knee in the past and sometimes it "locks up".  Fall       Home Medications Prior to Admission medications   Medication Sig Start Date End Date Taking? Authorizing Provider  lidocaine (LIDODERM) 5 % Place 1 patch onto the skin daily. Remove & Discard patch within 12 hours or as directed by MD 07/29/23  Yes Cristi Loron, Avonte Sensabaugh A, PA-C  hydrochlorothiazide (HYDRODIURIL) 25 MG tablet Take 1 tablet (25 mg total) by mouth daily. 07/27/22   Anabel Halon, MD  HYDROcodone-acetaminophen (NORCO/VICODIN) 5-325 MG tablet One tablet every six hours for pain.  Limit 5 days. 07/26/23   Darreld Mclean, MD  levETIRAcetam (KEPPRA) 750 MG tablet Take 1 tablet (750 mg total) by mouth 2 (two) times daily. 07/11/22   Windell Norfolk, MD  levocetirizine (XYZAL) 5 MG tablet Take 5 mg by mouth daily. 10/05/22   [provider]  sertraline (ZOLOFT) 50 MG tablet Take 1 tablet (50 mg total) by mouth daily. 07/27/22   Anabel Halon, MD      Allergies    Lisinopril, Penicillins, and Tramadol    Review of Systems   Review of Systems  Physical Exam Updated Vital Signs BP (!) 137/94   Pulse 62    Temp 98.8 F (37.1 C) (Oral)   Resp 18   Ht 5\' 1"  (1.549 m)   Wt 54.9 kg   SpO2 98%   BMI 22.86 kg/m  Physical Exam Vitals and nursing note reviewed.  Constitutional:      General: She is not in acute distress.    Appearance: She is well-developed.  HENT:     Head: Normocephalic and atraumatic.  Eyes:     Extraocular Movements: Extraocular movements intact.     Conjunctiva/sclera: Conjunctivae normal.     Pupils: Pupils are equal, round, and reactive to light.  Cardiovascular:     Rate and Rhythm: Normal rate and regular rhythm.     Heart sounds: No murmur heard. Pulmonary:     Effort: Pulmonary effort is normal. No respiratory distress.     Breath sounds: Normal breath sounds.  Abdominal:     Palpations: Abdomen is soft.     Tenderness: There is no abdominal tenderness.  Musculoskeletal:        General: No swelling.     Left shoulder: Normal pulse.       Arms:     Cervical back: Neck supple.     Lumbar back: Tenderness present. Negative right straight leg raise test and negative left straight leg raise test.  Back:     Left hip: Tenderness present. No crepitus. Normal range of motion.     Left knee: Normal range of motion. Tenderness present. Normal pulse.       Legs:     Comments: Abrasion to PIP and DIP joint of left little finger  Skin:    General: Skin is warm and dry.     Capillary Refill: Capillary refill takes less than 2 seconds.  Neurological:     General: No focal deficit present.     Mental Status: She is alert and oriented to person, place, and time.  Psychiatric:        Mood and Affect: Mood normal.     ED Results / Procedures / Treatments   Labs (all labs ordered are listed, but only abnormal results are displayed) Labs Reviewed - No data to display  EKG None  Radiology DG Shoulder Left Result Date: 07/29/2023 CLINICAL DATA:  58 year old female with history of trauma from a fall complaining of left-sided shoulder pain. EXAM: LEFT  SHOULDER - 2+ VIEW COMPARISON:  No priors. FINDINGS: There is no evidence of fracture or dislocation. There is no evidence of arthropathy or other focal bone abnormality. Soft tissues are unremarkable. IMPRESSION: Negative. Electronically Signed   By: Trudie Reed M.D.   On: 07/29/2023 10:37   DG Lumbar Spine Complete Result Date: 07/29/2023 CLINICAL DATA:  58 year old female with history of trauma from a fall complaining of back pain. EXAM: LUMBAR SPINE - COMPLETE 4+ VIEW COMPARISON:  No priors. FINDINGS: There is no evidence of lumbar spine fracture. Alignment is normal. Very mild degenerative disc disease and facet arthropathy, most evident at L5-S1. IMPRESSION: 1. No acute radiographic abnormality of the lumbar spine. 2. Mild degenerative disc disease and lumbar spondylosis, most evident at L5-S1. Electronically Signed   By: Trudie Reed M.D.   On: 07/29/2023 10:37   DG Knee Complete 4 Views Left Result Date: 07/29/2023 CLINICAL DATA:  58 year old female with history of trauma from a fall complaining of left-sided knee pain. EXAM: LEFT KNEE - COMPLETE 4+ VIEW COMPARISON:  No priors. FINDINGS: No evidence of fracture, dislocation, or joint effusion. No evidence of arthropathy or other focal bone abnormality. Soft tissues are unremarkable. IMPRESSION: Negative. Electronically Signed   By: Trudie Reed M.D.   On: 07/29/2023 10:36   DG Hip Unilat With Pelvis 2-3 Views Left Result Date: 07/29/2023 CLINICAL DATA:  58 year old female with history of trauma from a fall complaining of left-sided hip pain. EXAM: DG HIP (WITH OR WITHOUT PELVIS) 2-3V LEFT COMPARISON:  No priors. FINDINGS: There is no evidence of hip fracture or dislocation. There is no evidence of arthropathy or other focal bone abnormality. IMPRESSION: Negative. Electronically Signed   By: Trudie Reed M.D.   On: 07/29/2023 10:36   DG Hand Complete Left Result Date: 07/29/2023 CLINICAL DATA:  58 year old female with history of  trauma from a fall complaining of left-sided hand pain. EXAM: LEFT HAND - COMPLETE 3+ VIEW COMPARISON:  No priors. FINDINGS: There is no evidence of fracture or dislocation. There is no evidence of arthropathy or other focal bone abnormality. Soft tissues are unremarkable. IMPRESSION: Negative. Electronically Signed   By: Trudie Reed M.D.   On: 07/29/2023 10:35    Procedures Procedures    Medications Ordered in ED Medications  ketorolac (TORADOL) 15 MG/ML injection 15 mg (15 mg Intravenous Given 07/29/23 1024)    ED Course/ Medical Decision Making/ A&P  Medical Decision Making This patient presents to the ED for concern of pain in the left shoulder, left hand, low back left hip and knee after fall 2 days ago, this involves an extensive number of treatment options, and is a complaint that carries with it a high risk of complications and morbidity.  The differential diagnosis includes, dislocation, contusion, other   Co morbidities that complicate the patient evaluation :   Chronic low back pain, seizures, hypertension   Additional history obtained:  Additional history obtained from EMR External records from outside source obtained and reviewed including prior notes      Imaging Studies ordered:  I ordered imaging studies including x-ray lumbar spine which shows fracture or traumatic malalignment; x-ray left hand though fracture or traumatic malalignment; x-ray left shoulder shows no fracture or dislocation; x-ray left knee shows no fracture, no dislocation, no effusion; x-ray left hip shows no fracture, no dislocation I independently visualized and interpreted imaging within scope of identifying emergent findings  I agree with the radiologist interpretation   Problem List / ED Course / Critical interventions / Medication management  Fall with pain to the left side of body-patient had a fall 2 days ago when her knee "locked up".  She fell to  the left side, uses a cane with her right hand at baseline.  Did not hit her head, not on blood thinners, no LOC or dizziness.  She is having pain and bruising to the left hand, shoulder, hip knee and low back.  Imaging of all these areas are is reassuring, she has mildly reduced range of motion of the left shoulder where she could not get it fully above her head but is able to put her arm behind her back.  She has normal sensation throughout.  She is able to bear weight and ambulate with her cane as usual.  Reestablished with Dr. Romeo Apple in orthopedics and is going to follow-up with him, she states she has baseline low back pain and is awaiting outpatient MRI which is not scheduled until April.  She is advised on OTC medication as needed for pain and strict return precautions.  Gave knee sleeve for her knee discomfort to wear as needed I ordered medication including Toradol for pain Reevaluation of the patient after these medicines showed that the patient improved I have reviewed the patients home medicines and have made adjustments as needed   Amount and/or Complexity of Data Reviewed Radiology: ordered.  Risk Prescription drug management.           Final Clinical Impression(s) / ED Diagnoses Final diagnoses:  Contusion of left knee, initial encounter  Acute pain of left shoulder  Left-sided low back pain without sciatica, unspecified chronicity    Rx / DC Orders ED Discharge Orders          Ordered    lidocaine (LIDODERM) 5 %  Every 24 hours        07/29/23 [address] 07/29/23 1105    Bethann Berkshire, MD 07/30/23 1111

## 2023-07-29 NOTE — ED Triage Notes (Signed)
Pt states left knee gave out 2 days ago and fell on concrete on left side. Abrasion to left knee, and left hand.Pt states left shoulder and lower back are sore. Pt ambulates with a cane.

## 2023-07-31 ENCOUNTER — Telehealth: Payer: Self-pay | Admitting: Orthopaedic Surgery

## 2023-07-31 MED ORDER — HYDROCODONE-ACETAMINOPHEN 5-325 MG PO TABS: ORAL_TABLET | ORAL | 0 refills | Status: DC

## 2023-07-31 NOTE — Telephone Encounter (Signed)
Dr. Sanjuan Dame pt - spoke w/the pt, she stated that she called and lvm yesterday stating she had to go to the ED 07/29/23 due to a fall.  She is very sore, but nothing is broken.  She is requesting a refill on Hydrocodone 5-325 to be sent to [facility] on [address]  She stated she will not get out in the inclement weather to get it.

## 2023-08-08 ENCOUNTER — Telehealth: Payer: Self-pay | Admitting: Orthopaedic Surgery

## 2023-08-08 MED ORDER — HYDROCODONE-ACETAMINOPHEN 5-325 MG PO TABS
ORAL_TABLET | ORAL | 0 refills | Status: DC
Start: 2023-08-08 — End: 2023-08-15

## 2023-08-08 NOTE — Telephone Encounter (Signed)
 Dr. Sanjuan Dame pt - pt lvm requesting a refill on Hydrocodone 5-325 to be sent to [facility] on [address]

## 2023-08-15 ENCOUNTER — Telehealth: Payer: Self-pay | Admitting: Orthopaedic Surgery

## 2023-08-15 MED ORDER — HYDROCODONE-ACETAMINOPHEN 5-325 MG PO TABS: ORAL_TABLET | ORAL | 0 refills | Status: DC

## 2023-08-15 NOTE — Telephone Encounter (Signed)
 Dr. Sanjuan Dame pt - pt is requesting a refill on Hydrocodone 5-325 to be sent to [facility] on [facility].

## 2023-08-16 ENCOUNTER — Other Ambulatory Visit: Payer: Self-pay | Admitting: Internal Medicine

## 2023-08-16 DIAGNOSIS — I10 Essential (primary) hypertension: Secondary | ICD-10-CM

## 2023-08-22 ENCOUNTER — Telehealth: Payer: Self-pay | Admitting: Orthopaedic Surgery

## 2023-08-22 MED ORDER — HYDROCODONE-ACETAMINOPHEN 5-325 MG PO TABS: ORAL_TABLET | ORAL | 0 refills | Status: DC

## 2023-08-22 NOTE — Telephone Encounter (Signed)
Dr. Sanjuan Dame pt - pt lvm requesting a refill for Hydrocodone 5-325 to be sent to [facility] on [address]

## 2023-08-29 ENCOUNTER — Telehealth: Payer: Self-pay | Admitting: Orthopaedic Surgery

## 2023-08-29 MED ORDER — HYDROCODONE-ACETAMINOPHEN 5-325 MG PO TABS: ORAL_TABLET | ORAL | 0 refills | Status: DC

## 2023-08-29 NOTE — Telephone Encounter (Signed)
 DR. Hilda Lias  Patient called request her pain medicine   HYDROcodone-acetaminophen (NORCO/VICODIN) 5-325 MG tablet   Pharmacy:  [facility]

## 2023-09-05 ENCOUNTER — Encounter: Payer: Self-pay | Admitting: Orthopaedic Surgery

## 2023-09-05 ENCOUNTER — Ambulatory Visit (INDEPENDENT_AMBULATORY_CARE_PROVIDER_SITE_OTHER): Admitting: Orthopaedic Surgery

## 2023-09-05 VITALS — BP 138/102 | HR 75 | Ht 61.0 in | Wt 125.0 lb

## 2023-09-05 DIAGNOSIS — G8929 Other chronic pain: Secondary | ICD-10-CM

## 2023-09-05 DIAGNOSIS — M25562 Pain in left knee: Secondary | ICD-10-CM

## 2023-09-05 DIAGNOSIS — M545 Low back pain, unspecified: Secondary | ICD-10-CM | POA: Diagnosis not present

## 2023-09-05 DIAGNOSIS — M79605 Pain in left leg: Secondary | ICD-10-CM | POA: Diagnosis not present

## 2023-09-05 MED ORDER — HYDROCODONE-ACETAMINOPHEN 5-325 MG PO TABS
ORAL_TABLET | ORAL | 0 refills | Status: DC
Start: 2023-09-05 — End: 2023-09-11

## 2023-09-05 NOTE — Patient Instructions (Signed)
 While we are working on your approval for MRI please go ahead and call to schedule your appointment with [facility] within at least one (1) week.   Central Scheduling [phone]

## 2023-09-05 NOTE — Progress Notes (Signed)
 I hurt my back.  She fell on February 14th and hurt her back and left knee.  She was seen in the ER on 07-29-23 and had multiple X-rays.  I have reviewed the notes and the X-rays.  She continues to have pain in the lower back with radiation down the left leg past the knee.  She is not improving.  She has no weakness.  The left knee is more painful.  Lower back is painful, good ROM, NV intact, muscle tone and strength are normal, gait is limp to the left using a cane.  Left knee has effusion, crepitus, ROM 0 to 110, medial joint line pain, stable.  Encounter Diagnoses  Name Primary?   Lumbar pain with radiation down left leg Yes   Chronic pain of left knee    I have independently reviewed and interpreted x-rays of this patient done at another site by another physician or qualified health professional.  I will get MRI.  I have reviewed the [place] Controlled Substance Reporting System web site prior to prescribing narcotic medicine for this patient.  Return in three weeks.  Call if any problem.  Precautions discussed.  Electronically Signed Darreld Mclean, MD 3/26/20259:24 AM

## 2023-09-11 ENCOUNTER — Other Ambulatory Visit: Payer: Self-pay | Admitting: Orthopaedic Surgery

## 2023-09-11 ENCOUNTER — Other Ambulatory Visit: Payer: Self-pay | Admitting: Internal Medicine

## 2023-09-11 DIAGNOSIS — I10 Essential (primary) hypertension: Secondary | ICD-10-CM

## 2023-09-11 MED ORDER — HYDROCODONE-ACETAMINOPHEN 5-325 MG PO TABS: ORAL_TABLET | ORAL | 0 refills | Status: DC

## 2023-09-11 NOTE — Telephone Encounter (Signed)
 DR. Justice Britain PAIN MEDICINE    HYDROdone-acetaminophen (NORCO/VICODIN) 5-325 MG tablet    PHARMACY   [facility] ON [address]

## 2023-09-14 ENCOUNTER — Ambulatory Visit (HOSPITAL_COMMUNITY)

## 2023-09-19 ENCOUNTER — Encounter: Payer: Self-pay | Admitting: Orthopaedic Surgery

## 2023-09-19 ENCOUNTER — Telehealth: Payer: Self-pay | Admitting: Orthopaedic Surgery

## 2023-09-19 ENCOUNTER — Ambulatory Visit: Payer: 59 | Admitting: Orthopaedic Surgery

## 2023-09-19 ENCOUNTER — Ambulatory Visit: Admitting: Orthopaedic Surgery

## 2023-09-19 VITALS — BP 128/100 | HR 85 | Ht 61.0 in | Wt 125.0 lb

## 2023-09-19 DIAGNOSIS — G8929 Other chronic pain: Secondary | ICD-10-CM | POA: Diagnosis not present

## 2023-09-19 DIAGNOSIS — M5442 Lumbago with sciatica, left side: Secondary | ICD-10-CM

## 2023-09-19 MED ORDER — HYDROCODONE-ACETAMINOPHEN 5-325 MG PO TABS: ORAL_TABLET | ORAL | 0 refills | Status: DC

## 2023-09-19 NOTE — Progress Notes (Signed)
 My back is worse.  Her left sided sciatica and back pain is worse.  We have been pended by her insurance company for MRI.  She is taking her medicine, doing exercises as best as she can.    Lower back is tender, ROM decreased, muscle tone and strength normal, NV intact, pain more on the left side, no spasm.  Left knee tender with effusion and ROM 0 to 105.  Encounter Diagnosis  Name Primary?   Chronic midline low back pain with left-sided sciatica Yes   I will ask she be seen by PT.  Return in two weeks.  Continue her medicine.  See about MRI of the lumbar spine.  Call if any problem.  Precautions discussed.  Electronically Signed Darreld Mclean, MD 4/9/20252:24 PM

## 2023-09-19 NOTE — Telephone Encounter (Signed)
 Dr. Sanjuan Dame pt - spoke w/the pt, she is requesting a refill for Hydrocodone 5-325 to be sent to [facility] on [address]

## 2023-09-19 NOTE — Patient Instructions (Signed)
Begin PT. 

## 2023-09-26 ENCOUNTER — Ambulatory Visit: Admitting: Orthopaedic Surgery

## 2023-09-26 ENCOUNTER — Telehealth: Payer: Self-pay | Admitting: Orthopaedic Surgery

## 2023-09-26 MED ORDER — HYDROCODONE-ACETAMINOPHEN 5-325 MG PO TABS
ORAL_TABLET | ORAL | 0 refills | Status: DC
Start: 2023-09-26 — End: 2023-10-03

## 2023-09-26 NOTE — Telephone Encounter (Signed)
 Dr. Sanjuan Dame pt - spoke w/the pt, she is requesting a refill for Hydrocodone 5-325 to be sent to [facility] on [address]

## 2023-09-29 ENCOUNTER — Ambulatory Visit (HOSPITAL_COMMUNITY): Admission: RE | Admit: 2023-09-29 | Source: Ambulatory Visit

## 2023-10-02 ENCOUNTER — Ambulatory Visit (HOSPITAL_COMMUNITY)
Admission: RE | Admit: 2023-10-02 | Discharge: 2023-10-02 | Disposition: A | Discharge status: Home / Self Care | Source: Ambulatory Visit | Attending: Orthopaedic Surgery | Admitting: Orthopaedic Surgery

## 2023-10-02 DIAGNOSIS — M545 Low back pain, unspecified: Secondary | ICD-10-CM | POA: Diagnosis present

## 2023-10-02 DIAGNOSIS — M79605 Pain in left leg: Secondary | ICD-10-CM | POA: Diagnosis present

## 2023-10-03 ENCOUNTER — Ambulatory Visit: Admitting: Orthopaedic Surgery

## 2023-10-03 ENCOUNTER — Telehealth: Payer: Self-pay | Admitting: Orthopaedic Surgery

## 2023-10-03 MED ORDER — HYDROCODONE-ACETAMINOPHEN 5-325 MG PO TABS: ORAL_TABLET | ORAL | 0 refills | Status: DC

## 2023-10-03 NOTE — Telephone Encounter (Signed)
 Dr. Vicente Graham pt - pt presented to the office and is requesting a refill for Hydrocodone  5-325 to be sent to [facility] on [address]

## 2023-10-10 ENCOUNTER — Telehealth: Payer: Self-pay | Admitting: Orthopaedic Surgery

## 2023-10-10 MED ORDER — HYDROCODONE-ACETAMINOPHEN 5-325 MG PO TABS: ORAL_TABLET | ORAL | 0 refills | Status: DC

## 2023-10-10 NOTE — Telephone Encounter (Signed)
 Dr. Vicente Cross pt - pt lvm requesting a refill for Hydrocdone 5-325 to be sent to [facility] on [address]

## 2023-10-15 ENCOUNTER — Ambulatory Visit (HOSPITAL_COMMUNITY): Attending: Orthopaedic Surgery

## 2023-10-15 ENCOUNTER — Encounter (HOSPITAL_COMMUNITY): Payer: Self-pay

## 2023-10-15 ENCOUNTER — Other Ambulatory Visit: Payer: Self-pay

## 2023-10-15 DIAGNOSIS — G8929 Other chronic pain: Secondary | ICD-10-CM | POA: Diagnosis present

## 2023-10-15 DIAGNOSIS — M5442 Lumbago with sciatica, left side: Secondary | ICD-10-CM | POA: Insufficient documentation

## 2023-10-15 DIAGNOSIS — M6281 Muscle weakness (generalized): Secondary | ICD-10-CM | POA: Diagnosis present

## 2023-10-15 NOTE — Therapy (Addendum)
 OUTPATIENT PHYSICAL THERAPY THORACOLUMBAR EVALUATION   Patient Name: Judy Cross MRN: [id number] DOB:12/17/1965, 58 y.o., female Today's Date: 10/15/2023  END OF SESSION:  PT End of Session - 10/15/23 1050     Visit Number 1    Date for PT Re-Evaluation 11/12/23    Authorization Type [employer] Primary; [facility] Medicaid Secondary    Authorization Time Period seeking new auth    Progress Note Due on Visit 8    PT Start Time 1019    PT Stop Time 1051    PT Time Calculation (min) 32 min    Behavior During Therapy [facility] for tasks assessed/performed             Past Medical History:  Diagnosis Date   Allergy    Anxiety 06/12/2019   Arthritis    Asthma in adult, mild intermittent, uncomplicated 12/07/2016   Breast nodule 10/12/2015   Breast nodule 10/12/2015   Bulge of lumbar disc without myelopathy    Depression    Endometrial polyp 04/28/2013   Enlarged uterus 03/26/2013   Fibroids 04/28/2013   Hot flashes 03/26/2013   Hot flashes 03/26/2013   Hypertension    Irregular bleeding 03/26/2013   Neuromuscular disorder (HCC)    sciatica   Seasonal allergies    Seizures (HCC)    last seizure was 2 years ago; unknown etiology. On Keppra .   Trichomoniasis 10/20/2020   tx 10/20/20  POC___   Past Surgical History:  Procedure Laterality Date   ABLATION     with polyp removal.   BREAST BIOPSY Left    FIBROCYSTIC CHANGES WITH APOCRINE METAPLASIA. - PSEUDOANGIOMATOUS STROMAL HYPERPLASIA   CESAREAN SECTION     x 3   COLONOSCOPY WITH PROPOFOL  N/A 01/11/2016   Surgeon: Alyce Jubilee, MD; mild diverticulosis in proximal ascending colon, nonbleeding internal hemorrhoids.  Repeat in 10 years.   EXAMINATION UNDER ANESTHESIA  02/28/2012   Procedure: EXAM UNDER ANESTHESIA;  Surgeon: Lovena Rubinstein, MD;  Location: [facility];  Service: General;  Laterality: N/A;   EXCISION MASS UPPER EXTREMETIES Left 11/13/2019   Procedure: EXCISION MASS UPPER EXTREMETIES ring finger left;  Surgeon:  Darrin Emerald, MD;  Location: [facility];  Service: Orthopedics;  Laterality: Left;   GANGLION CYST EXCISION Left    HYSTEROSCOPY WITH D & C N/A 05/23/2013   Procedure: DILATATION AND CURETTAGE /HYSTEROSCOPY;  Surgeon: Albino Hum, MD;  Location: [facility];  Service: Gynecology;  Laterality: N/A;   POLYPECTOMY N/A 05/23/2013   Procedure: ENDOMETRIAL POLYP REMOVAL;  Surgeon: Albino Hum, MD;  Location: [facility];  Service: Gynecology;  Laterality: N/A;   SPHINCTEROTOMY  02/28/2012   Procedure: SPHINCTEROTOMY;  Surgeon: Lovena Rubinstein, MD;  Location: [facility];  Service: General;  Laterality: N/A;  Lateral Internal Sphincterotomy   TRIGGER FINGER RELEASE Left    ring finger   TUBAL LIGATION     Patient Active Problem List   Diagnosis Date Noted   Breast pain, left 09/26/2022   Moderate episode of recurrent major depressive disorder (HCC) 09/08/2022   DDD (degenerative disc disease), lumbar 09/08/2022   Encounter for general adult medical examination with abnormal findings 07/27/2022   Hypokalemia 03/22/2022   Vitamin D  deficiency 03/22/2022   Fall 03/22/2022   Tobacco abuse 01/06/2022   Spinal stenosis of lumbar region 06/17/2021   Mass of lower outer quadrant of left breast 05/09/2021   Arthritis of left knee 12/02/2020   Chronic left shoulder pain 12/02/2020   Allergic sinusitis  12/02/2020   Mass of left finger    Anxiety 06/12/2019   Seizure disorder (HCC) 06/12/2019   Environmental and seasonal allergies 06/12/2019   Abnormal Pap smear of cervix 05/27/2019   Chronic midline low back pain with left-sided sciatica 01/12/2017   Essential hypertension 12/07/2016   Mild intermittent asthma without complication 12/07/2016    PCP: Meldon Sport, MD  REFERRING PROVIDER: Pleasant Brilliant, MD  REFERRING DIAG: [phone] (ICD-10-CM) - Chronic midline low back pain with left-sided sciatica   Rationale for Evaluation and Treatment: Rehabilitation  THERAPY DIAG:  Chronic  left-sided low back pain with left-sided sciatica - Plan: PT plan of care cert/re-cert  Muscle weakness (generalized) - Plan: PT plan of care cert/re-cert  ONSET DATE: For year  SUBJECTIVE:                                                                                                                                                                                           SUBJECTIVE STATEMENT: Pt reports hip pain that radiates down into her toe. Sometimes it gets numb and tingling. Pt reports seeing her provider and said its coming from her back. Pt reports having her MRI completed, just waiting to followup with provider.  Pt reports left leg buckling during ambulation.   PERTINENT HISTORY:  Chronic  pain  PAIN:  Are you having pain? Yes: NPRS scale: 8/10 Pain location: Left hip and buttocks Pain description: continuously sharp/constant Aggravating factors: Slouching/sitting too long Relieving factors: Medicine  PRECAUTIONS: None  RED FLAGS: None   WEIGHT BEARING RESTRICTIONS: No  FALLS:  Has patient fallen in last 6 months? Yes. Number of falls 1   PATIENT GOALS: "get out of pain"  OBJECTIVE:  Note: Objective measures were completed at Evaluation unless otherwise noted.  DIAGNOSTIC FINDINGS:    PATIENT SURVEYS:  Modified Oswestry 24/50 = 48%   COGNITION: Overall cognitive status: Within functional limits for tasks assessed     SENSATION: Light touch: Increased sensitivity on LLE compared to RLE.    POSTURE: rounded shoulders, forward head, increased lumbar lordosis, and anterior pelvic tilt  PALPATION: Tender to palpate and increased tone with Left paraspinals  LUMBAR ROM:   AROM eval  Flexion 100% and painful  Extension 100% and peripheralization with repeated motions  Right lateral flexion 100%  Left lateral flexion 100%  Right rotation 50% and peripheralizes  Left rotation 50% and peripheralizes   (Blank rows = not tested)  LOWER EXTREMITY ROM:      Active  Right eval Left eval  Hip flexion    Hip extension    Hip abduction    Hip  adduction    Hip internal rotation    Hip external rotation    Knee flexion    Knee extension    Ankle dorsiflexion    Ankle plantarflexion    Ankle inversion    Ankle eversion     (Blank rows = not tested)  LOWER EXTREMITY MMT:    MMT Right eval Left eval  Hip flexion 4- 3+  Hip extension 3- 3-  Hip abduction 4- 3-  Hip adduction    Hip internal rotation    Hip external rotation    Knee flexion    Knee extension 4+ 3-  Ankle dorsiflexion    Ankle plantarflexion    Ankle inversion    Ankle eversion     (Blank rows = not tested)  LUMBAR SPECIAL TESTS:  Straight leg raise test: Positive and Slump test: Positive  FUNCTIONAL TESTS:  30 Second Chair Stand Test: 7x  Norms:   Age 64-64 9-69 56-74 60-79 40-84 29-89 70-94  Women 15 15 14 13 12 11 9   Men 17 16 15 14 13 11 9     L SLS: 9.38 seconds  R SLS: 13.83 seconds  Norms: 18-39  F: 43.5 seconds  M: 43.2 seconds 40-49  F: 40.4 seconds  M: 40.1 seconds 50-59  F: 36 seconds  M: 38.1 seconds 60-69  F: 25.1 seconds  M: 28.7 seconds 70-79  F: 11.3 seconds  M: 18.3 seconds  GAIT: Distance walked: 56ft Assistive device utilized: [employer] treasure[employer] base Level of assistance: Modified independence Comments: antalgic gait pattern with limited step length bilaterally and foot flat during swing and stance  TREATMENT DATE:   Evaluation  POE x 2'                                                                                                                                 PATIENT EDUCATION:  Education details: PT Evaluation, findings, prognosis, frequency, attendance policy, and spent time educating pt on Prone on elbows to reduce peripheralization. Person educated: Patient Education method: Medical illustrator Education comprehension: verbalized understanding and needs further education  HOME EXERCISE  PROGRAM: To initiate formal HEP next session  ASSESSMENT:  CLINICAL IMPRESSION: Patient is a 58 y.o. female who was seen today for physical therapy evaluation and treatment for M54.42,G89.29 (ICD-10-CM) - Chronic midline low back pain with left-sided sciatica. Pt with increased intensity of pain, rating 8/10 initially but pt's presentation does not seem to correlate high level pain rating today. Pt's Left sided sciatica seems to flair up with flexion and side being activities and prefers extension based activities limited centralization. Pt demonstrating significant hip weakness with poor movement patterns and lumbar instability during transitions off mat table. Pts objective impairments below are limiting pt's functional mobility, ADLs, activity tolerance and balance . Pt will benefit from skilled Physical Therapy services to address deficits/limitations in order to improve functional and QOL.    OBJECTIVE IMPAIRMENTS: Abnormal  gait, decreased activity tolerance, decreased balance, decreased mobility, difficulty walking, decreased ROM, decreased strength, postural dysfunction, and pain.   ACTIVITY LIMITATIONS: carrying, lifting, bending, sitting, standing, stairs, and transfers  PARTICIPATION LIMITATIONS: shopping, community activity, and occupation  PERSONAL FACTORS: Age and 3+ comorbidities: Anxiety, seizures, "disc bulge"  are also affecting patient's functional outcome.   REHAB POTENTIAL: Fair pts level of pain intensity  CLINICAL DECISION MAKING: Stable/uncomplicated  EVALUATION COMPLEXITY: Low  GOALS: Goals reviewed with patient? No  SHORT TERM GOALS: Target date: 10/29/23  Pt will be independent with HEP in order to demonstrate participation in Physical Therapy POC.  Baseline: Goal status: INITIAL  2.  Pt will report 6/10 pain with mobility in order to demonstrate improved pain with ADLs.  Baseline:  Goal status: INITIAL  LONG TERM GOALS: Target date: 11/12/23  Pt will  improve 30 Second Chair Stand Test by at least 2 in order to demonstrate improved functional strength to return to desired activities.  Baseline: see objective.  Goal status: INITIAL  2.  Pt will improve Single Leg Balance  by at least 5 seconds in order to demonstrate improved functional ambulatory capacity in community setting.  Baseline: see objective.  Goal status: INITIAL  3.  Pt will improve Modified Oswestry score by 20% in order to demonstrate improved pain with functional goals and outcomes. Baseline: see objective.  Goal status: INITIAL  4.  Pt will report 4/10 pain with mobility in order to demonstrate reduced pain with ADLs lasting greater than 30 minutes.  Baseline: see objective.  Goal status: INITIAL  PLAN:  PT FREQUENCY: 2x/week  PT DURATION: 4 weeks  PLANNED INTERVENTIONS: 97164- PT Re-evaluation, 97750- Physical Performance Testing, 97110-Therapeutic exercises, 97530- Therapeutic activity, 97112- Neuromuscular re-education, 97535- Self Care, 16109- Manual therapy, [phone]- Gait training, [phone]- Electrical stimulation (unattended), [phone]- Electrical stimulation (manual), Patient/Family education, Balance training, Stair training, Joint mobilization, Joint manipulation, Spinal manipulation, Spinal mobilization, Cryotherapy, and Moist heat.  PLAN FOR NEXT SESSION: extension based activities, core stability, hip strengthening  Astrid Lay, DPT [facility] [place] [phone] office`   Managed Medicaid Authorization Request  Visit Dx Codes: M54.42, G89.29  Functional Tool Score: 48%  For all possible CPT codes, reference the Planned Interventions line above.     Check all conditions that are expected to impact treatment: {Conditions expected to impact treatment:Unknown   If treatment provided at initial evaluation, no treatment charged due to lack of authorization.      Gatha Kaska, PT 10/15/2023, 10:58 AM

## 2023-10-17 ENCOUNTER — Ambulatory Visit: Admitting: Orthopaedic Surgery

## 2023-10-17 ENCOUNTER — Encounter: Payer: Self-pay | Admitting: Orthopaedic Surgery

## 2023-10-17 VITALS — BP 154/111 | HR 68 | Ht 61.0 in | Wt 125.0 lb

## 2023-10-17 DIAGNOSIS — M25562 Pain in left knee: Secondary | ICD-10-CM | POA: Diagnosis not present

## 2023-10-17 DIAGNOSIS — G8929 Other chronic pain: Secondary | ICD-10-CM

## 2023-10-17 DIAGNOSIS — M5442 Lumbago with sciatica, left side: Secondary | ICD-10-CM

## 2023-10-17 MED ORDER — HYDROCODONE-ACETAMINOPHEN 5-325 MG PO TABS
ORAL_TABLET | ORAL | 0 refills | Status: DC
Start: 2023-10-17 — End: 2023-10-24

## 2023-10-17 NOTE — Progress Notes (Signed)
 My back is worse.  She had MRI of the lumbar spine showing: IMPRESSION: 1. 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 2. No change in mild left foraminal narrowing at L3-4 and mild bilateral foraminal narrowing at L4-5.  I have explained the findings to her.  I will have her see neurosurgery.  I have independently reviewed the MRI.    She is using a cane and the pain goes to the left foot.  She is in pain.  ROM of back is decreased secondary to pain, she has positive SLR on left at 20 degrees, NV intact, muscle strength and tone good, gait slow with cane.  Encounter Diagnoses  Name Primary?   Chronic midline low back pain with left-sided sciatica Yes   Chronic pain of left knee    I have reviewed the [place]  Controlled Substance Reporting System web site prior to prescribing narcotic medicine for this patient.  Return in six weeks.  To neurosurgery.  Call if any problem.  Precautions discussed.  Electronically Signed Pleasant Brilliant, MD 5/7/20259:13 AM

## 2023-10-17 NOTE — Addendum Note (Signed)
 Addended by: [place] Snow T on: 10/17/2023 10:36 AM   Modules accepted: Orders

## 2023-10-18 ENCOUNTER — Ambulatory Visit (HOSPITAL_COMMUNITY)

## 2023-10-18 ENCOUNTER — Telehealth: Payer: Self-pay

## 2023-10-18 NOTE — Telephone Encounter (Signed)
 Odilia Bennett from [facility] called triage.  They received referral from Dr.Keeling, but cannot accept patient. They do not accept her insurance.

## 2023-10-22 NOTE — Telephone Encounter (Signed)
 Referral faxed to [facility].

## 2023-10-24 ENCOUNTER — Telehealth: Payer: Self-pay | Admitting: Orthopaedic Surgery

## 2023-10-24 MED ORDER — HYDROCODONE-ACETAMINOPHEN 5-325 MG PO TABS: ORAL_TABLET | ORAL | 0 refills | Status: DC

## 2023-10-24 NOTE — Telephone Encounter (Signed)
Dr. Sanjuan Dame pt - pt lvm requesting a refill for Hydrocodone 5-325 to be sent to [facility] on [address]

## 2023-10-24 NOTE — Telephone Encounter (Signed)
 Called pt and advised her I believe [facility] takes her insurance but she will need to contact her insurace and see whom is on ner network plan.

## 2023-10-24 NOTE — Telephone Encounter (Signed)
 This is already noted in the referral

## 2023-10-24 NOTE — Telephone Encounter (Signed)
 I think the patient is wanting to see if there is somewhere else she can be referred to that will take her insurance.

## 2023-10-24 NOTE — Telephone Encounter (Signed)
 Dr. Vicente Graham - spoke w/the pt, she stated that [facility]) doesn't take her insurance, [employer].

## 2023-10-25 ENCOUNTER — Telehealth: Payer: Self-pay | Admitting: *Deleted

## 2023-10-25 NOTE — Telephone Encounter (Signed)
 waiting on pt to return my call to see if she would like to travel to [facility] in [place]

## 2023-10-31 ENCOUNTER — Telehealth: Payer: Self-pay | Admitting: Orthopaedic Surgery

## 2023-10-31 MED ORDER — HYDROCODONE-ACETAMINOPHEN 5-325 MG PO TABS: ORAL_TABLET | ORAL | 0 refills | Status: DC

## 2023-10-31 NOTE — Telephone Encounter (Signed)
 I tried calling pt back, something was wrong with phone. She could not hear me. Will wait for her to call back.

## 2023-10-31 NOTE — Telephone Encounter (Signed)
Dr. Sanjuan Dame pt - pt lvm requesting a refill for Hydrocodone 5-325 to be sent to [facility] on [address]

## 2023-10-31 NOTE — Telephone Encounter (Signed)
 This has been taken care. Multiple messages

## 2023-10-31 NOTE — Telephone Encounter (Signed)
 Dr. Vicente Graham pt - pt lvm wanting to know if this was going to be called in today.  [phone]

## 2023-10-31 NOTE — Telephone Encounter (Signed)
 Dr. Vicente Graham pt - the patient has called back again, this is the 4th or 5th time today.  Irena Manners, can you call her back please?  [phone]

## 2023-10-31 NOTE — Telephone Encounter (Signed)
 Sw pt she wanted to see if her meds were called in yet and to let me know he insurance has changed and will go into affect in 15 days.

## 2023-11-01 ENCOUNTER — Telehealth: Payer: Self-pay | Admitting: Orthopaedic Surgery

## 2023-11-01 NOTE — Telephone Encounter (Signed)
 Judy Cross has spoken with pt earlier

## 2023-11-01 NOTE — Telephone Encounter (Signed)
 DR. Karen Osmond has called and wants to know why her prescription has not been called in yet.  She is in a lot of pain and needs her medicine.

## 2023-11-07 ENCOUNTER — Telehealth: Payer: Self-pay | Admitting: Orthopaedic Surgery

## 2023-11-07 ENCOUNTER — Encounter (HOSPITAL_COMMUNITY)

## 2023-11-07 MED ORDER — HYDROCODONE-ACETAMINOPHEN 5-325 MG PO TABS: ORAL_TABLET | ORAL | 0 refills | Status: DC

## 2023-11-07 NOTE — Telephone Encounter (Signed)
 Dr. Vicente Graham pt - spoke w/the pt, she is requesting a refill for Hydrocodone  5-325 to be sent to [facility] on [address].  She stated they were called in so late last week that she got them on Thursday instead of Wednesday.  She wants to know if she can get them today.  [phone]

## 2023-11-12 ENCOUNTER — Encounter (HOSPITAL_COMMUNITY): Admitting: Physical Therapy

## 2023-11-14 ENCOUNTER — Telehealth: Payer: Self-pay | Admitting: Orthopaedic Surgery

## 2023-11-14 ENCOUNTER — Encounter (HOSPITAL_COMMUNITY): Admitting: Physical Therapy

## 2023-11-14 MED ORDER — HYDROCODONE-ACETAMINOPHEN 5-325 MG PO TABS: ORAL_TABLET | ORAL | 0 refills | Status: DC

## 2023-11-14 NOTE — Telephone Encounter (Signed)
Dr. Sanjuan Dame pt - pt lvm requesting a refill for Hydrocodone 5-325 to be sent to [facility] on [address]

## 2023-11-19 ENCOUNTER — Encounter (HOSPITAL_COMMUNITY): Admitting: Physical Therapy

## 2023-11-21 ENCOUNTER — Telehealth: Payer: Self-pay | Admitting: Orthopaedic Surgery

## 2023-11-21 MED ORDER — HYDROCODONE-ACETAMINOPHEN 5-325 MG PO TABS: ORAL_TABLET | ORAL | 0 refills | Status: DC

## 2023-11-21 NOTE — Telephone Encounter (Signed)
Dr. Sanjuan Dame pt - pt lvm requesting a refill for Hydrocodone 5-325 to be sent to [facility] on [address]

## 2023-11-22 ENCOUNTER — Encounter (HOSPITAL_COMMUNITY)

## 2023-11-27 ENCOUNTER — Encounter (HOSPITAL_COMMUNITY)

## 2023-11-28 ENCOUNTER — Encounter: Payer: Self-pay | Admitting: Orthopaedic Surgery

## 2023-11-28 ENCOUNTER — Ambulatory Visit (INDEPENDENT_AMBULATORY_CARE_PROVIDER_SITE_OTHER): Admitting: Orthopaedic Surgery

## 2023-11-28 DIAGNOSIS — M5442 Lumbago with sciatica, left side: Secondary | ICD-10-CM | POA: Diagnosis not present

## 2023-11-28 DIAGNOSIS — G8929 Other chronic pain: Secondary | ICD-10-CM | POA: Diagnosis not present

## 2023-11-28 DIAGNOSIS — M25562 Pain in left knee: Secondary | ICD-10-CM | POA: Diagnosis not present

## 2023-11-28 MED ORDER — HYDROCODONE-ACETAMINOPHEN 5-325 MG PO TABS: ORAL_TABLET | ORAL | 0 refills | Status: DC

## 2023-11-28 NOTE — Progress Notes (Signed)
 My back is hurting.  She has new insurance and is to see the neurosurgeon soon.  She delayed pending her insurance.  Her back still hurts.  Her left knee is tender but has no new trauma.  She has no giving way.  Left knee has ROM 0 to 110, stable, NV intact, no distal edema, limp to the left, uses a cane.  Lower back is diffusely tender, NV intact, muscle tone and strength are normal.  Encounter Diagnoses  Name Primary?   Chronic midline low back pain with left-sided sciatica Yes   Chronic pain of left knee    I have reviewed the [place]  Controlled Substance Reporting System web site prior to prescribing narcotic medicine for this patient.  Return in six weeks.  Call if any problem.  Precautions discussed.  Electronically Signed Pleasant Brilliant, MD 6/18/20259:15 AM

## 2023-11-29 ENCOUNTER — Encounter (HOSPITAL_COMMUNITY)

## 2023-12-05 ENCOUNTER — Other Ambulatory Visit: Payer: Self-pay | Admitting: Orthopaedic Surgery

## 2023-12-05 MED ORDER — HYDROCODONE-ACETAMINOPHEN 5-325 MG PO TABS: ORAL_TABLET | ORAL | 0 refills | Status: DC

## 2023-12-05 NOTE — Telephone Encounter (Signed)
 To Dr VEAR per Sari Dr K is out of the office today

## 2023-12-05 NOTE — Telephone Encounter (Signed)
 Dr. Janae Judy Cross - Judy Cross lvm stating it's time for her refill for Hydrocodone  5-325, 22 tablets, one tablet every six hours for pain.  Limit 5 days to be sent to [facility] on [address]

## 2023-12-07 ENCOUNTER — Other Ambulatory Visit (HOSPITAL_COMMUNITY)
Admission: RE | Admit: 2023-12-07 | Discharge: 2023-12-07 | Disposition: A | Discharge status: Home / Self Care | Source: Ambulatory Visit | Attending: Adult Health | Admitting: Adult Health

## 2023-12-07 ENCOUNTER — Ambulatory Visit (INDEPENDENT_AMBULATORY_CARE_PROVIDER_SITE_OTHER): Admitting: Adult Health

## 2023-12-07 ENCOUNTER — Encounter: Payer: Self-pay | Admitting: Adult Health

## 2023-12-07 VITALS — BP 150/93 | HR 67 | Ht 61.5 in | Wt 118.0 lb

## 2023-12-07 DIAGNOSIS — I10 Essential (primary) hypertension: Secondary | ICD-10-CM

## 2023-12-07 DIAGNOSIS — R140 Abdominal distension (gaseous): Secondary | ICD-10-CM

## 2023-12-07 DIAGNOSIS — R112 Nausea with vomiting, unspecified: Secondary | ICD-10-CM | POA: Diagnosis not present

## 2023-12-07 DIAGNOSIS — Z124 Encounter for screening for malignant neoplasm of cervix: Secondary | ICD-10-CM | POA: Diagnosis present

## 2023-12-07 DIAGNOSIS — R109 Unspecified abdominal pain: Secondary | ICD-10-CM | POA: Diagnosis not present

## 2023-12-07 MED ORDER — PROMETHAZINE HCL 25 MG PO TABS
25.0000 mg | ORAL_TABLET | Freq: Four times a day (QID) | ORAL | 1 refills | Status: AC | PRN
Start: 2023-12-07 — End: ?

## 2023-12-07 NOTE — Progress Notes (Signed)
  Subjective:     Patient ID: Judy Cross, female   DOB: Feb 27, 1966, 58 y.o.   MRN: [id number]  HPI [name] is a 58 year old black female,single, PM in complaining of stomach pain, feels bloated and had nausea and vomiting yesterday and some diarrhea, no fever.  She needs a pap.  PCP is Dr Tobie.  Review of Systems +stomach pain,  feels bloated +nausea and vomiting yesterday and some diarrhea, no fever.    Reviewed past medical,surgical, social and family history. Reviewed medications and allergies.  Objective:   Physical Exam BP (!) 150/93 (BP Location: Right Arm, Patient Position: Sitting, Cuff Size: Normal)   Pulse 67   Ht 5' 1.5 (1.562 m)   Wt 118 lb (53.5 kg)   BMI 21.93 kg/m   Skin warm and dry. Abdomen is soft, no HSM noted, is tender epigastric area and to right of navel, no rebound. Pelvic: external genitalia is normal in appearance no lesions, vagina: creamy discharge with slight odor,urethra has no lesions or masses noted, cervix:smooth,pap with HR HPV genotyping and GC/CHL,trich performed, uterus: normal size, shape and contour, non tender, no masses felt, adnexa: no masses or tenderness noted. Bladder is non tender and no masses felt.    Fall risk is high  [facility] - 12/07/23 1142       Pregnancy Intention Screening   Does the patient want to become pregnant in the next year? N/A    Does the patient's partner want to become pregnant in the next year? N/A    Would the patient like to discuss contraceptive options today? N/A      Contraception Wrap Up   Current Method Female Sterilization   ablation   End Method Female Sterilization   ablation   Contraception Counseling Provided No         Examination chaperoned by Clarita Salt LPN  Assessment:     1. Routine Papanicolaou smear Pap sent Pap in 3 years if normal  - Cytology - PAP( [place])  2. Stomach pain (Primary) +tenderness epigastric area and to right of navel, no rebound Abdominal US  scheduled for  12/17/23 at 7:30 am at [facility] - US  Abdomen Complete; Future  3. Bloated abdomen  - US  Abdomen Complete; Future  4. Nausea and vomiting, unspecified vomiting type Will rx phenergan Meds ordered this encounter  Medications   promethazine (PHENERGAN) 25 MG tablet    Sig: Take 1 tablet (25 mg total) by mouth every 6 (six) hours as needed for nausea or vomiting.    Dispense:  30 tablet    Refill:  1    Supervising Provider:   JAYNE MINDER H [[id number]]    - US  Abdomen Complete; Future  5. Essential hypertension Take meds and follow up with PCP    Plan:     Follow up TBD

## 2023-12-12 ENCOUNTER — Ambulatory Visit: Payer: Self-pay | Admitting: Adult Health

## 2023-12-12 ENCOUNTER — Telehealth: Payer: Self-pay | Admitting: Orthopaedic Surgery

## 2023-12-12 DIAGNOSIS — R87610 Atypical squamous cells of undetermined significance on cytologic smear of cervix (ASC-US): Secondary | ICD-10-CM | POA: Insufficient documentation

## 2023-12-12 LAB — CYTOLOGY - PAP
Chlamydia: NEGATIVE
Comment: NEGATIVE
Comment: NEGATIVE
Comment: NEGATIVE
Comment: NEGATIVE
Comment: NEGATIVE
Comment: NORMAL
Diagnosis: UNDETERMINED — AB
HPV 16: NEGATIVE
HPV 18 / 45: NEGATIVE
High risk HPV: POSITIVE — AB
Neisseria Gonorrhea: NEGATIVE
Trichomonas: NEGATIVE

## 2023-12-12 MED ORDER — HYDROCODONE-ACETAMINOPHEN 5-325 MG PO TABS: ORAL_TABLET | ORAL | 0 refills | Status: DC

## 2023-12-12 NOTE — Telephone Encounter (Signed)
 DR. BRENNA   Patient called back again at 2:18 wanting to know when is her pain medicine going to be called in?  Call her back at [phone]

## 2023-12-12 NOTE — Telephone Encounter (Signed)
 DR. BRENNA  Patient called request refill on her pain medicine   HYDROcodone -acetaminophen  (NORCO/VICODIN) 5-325 MG tablet   Pharmacy:  [facility] down the street

## 2023-12-17 ENCOUNTER — Ambulatory Visit (HOSPITAL_COMMUNITY): Admission: RE | Admit: 2023-12-17 | Source: Ambulatory Visit

## 2023-12-19 ENCOUNTER — Telehealth: Payer: Self-pay | Admitting: Orthopaedic Surgery

## 2023-12-19 MED ORDER — HYDROCODONE-ACETAMINOPHEN 5-325 MG PO TABS: ORAL_TABLET | ORAL | 0 refills | Status: DC

## 2023-12-19 NOTE — Telephone Encounter (Signed)
 Dr. Sanjuan Dame pt - spoke w/the pt, she is requesting a refill for Hydrocodone 5-325 to be sent to [facility] on [address]

## 2023-12-26 ENCOUNTER — Telehealth: Payer: Self-pay | Admitting: Orthopaedic Surgery

## 2023-12-26 MED ORDER — HYDROCODONE-ACETAMINOPHEN 5-325 MG PO TABS: ORAL_TABLET | ORAL | 0 refills | Status: DC

## 2023-12-26 NOTE — Telephone Encounter (Signed)
 Dr. Sanjuan Dame pt - spoke w/the pt, she is requesting a refill for Hydrocodone 5-325 to be sent to [facility] on [address]

## 2023-12-26 NOTE — Telephone Encounter (Signed)
 Dr. Janae pt has called three times today, her third call was a vm and she stated she didn't know what the problem is with getting her medication.  She wants a call back [phone]

## 2023-12-26 NOTE — Telephone Encounter (Signed)
 DR, BRENNA  Patient called lvm wanting her pain medicine called in    HYDROcodone -acetaminophen  (NORCO/VICODIN) 5-325 MG tablet    Pharmacy  [facility]

## 2023-12-27 ENCOUNTER — Other Ambulatory Visit (HOSPITAL_COMMUNITY): Payer: Self-pay | Admitting: Adult Health

## 2023-12-27 DIAGNOSIS — N63 Unspecified lump in unspecified breast: Secondary | ICD-10-CM

## 2024-01-01 ENCOUNTER — Ambulatory Visit (HOSPITAL_COMMUNITY)
Admission: RE | Admit: 2024-01-01 | Discharge: 2024-01-01 | Discharge status: Home / Self Care | Source: Ambulatory Visit | Attending: Adult Health | Admitting: Adult Health

## 2024-01-01 ENCOUNTER — Ambulatory Visit (HOSPITAL_COMMUNITY)
Admission: RE | Admit: 2024-01-01 | Discharge: 2024-01-01 | Disposition: A | Discharge status: Home / Self Care | Source: Ambulatory Visit | Attending: Adult Health | Admitting: Adult Health

## 2024-01-01 DIAGNOSIS — N63 Unspecified lump in unspecified breast: Secondary | ICD-10-CM

## 2024-01-02 ENCOUNTER — Telehealth: Payer: Self-pay | Admitting: Orthopaedic Surgery

## 2024-01-02 MED ORDER — HYDROCODONE-ACETAMINOPHEN 5-325 MG PO TABS: ORAL_TABLET | ORAL | 0 refills | Status: DC

## 2024-01-02 NOTE — Telephone Encounter (Signed)
Dr. Sanjuan Dame pt - pt lvm requesting a refill for Hydrocodone 5-325 to be sent to [facility] on [address]

## 2024-01-09 ENCOUNTER — Encounter: Payer: Self-pay | Admitting: Orthopaedic Surgery

## 2024-01-09 ENCOUNTER — Ambulatory Visit (INDEPENDENT_AMBULATORY_CARE_PROVIDER_SITE_OTHER): Admitting: Orthopaedic Surgery

## 2024-01-09 DIAGNOSIS — M5442 Lumbago with sciatica, left side: Secondary | ICD-10-CM | POA: Diagnosis not present

## 2024-01-09 DIAGNOSIS — G8929 Other chronic pain: Secondary | ICD-10-CM | POA: Diagnosis not present

## 2024-01-09 MED ORDER — HYDROCODONE-ACETAMINOPHEN 5-325 MG PO TABS: ORAL_TABLET | ORAL | 0 refills | Status: DC

## 2024-01-09 NOTE — Patient Instructions (Signed)
 Follow up in 6 weeks with Dr. Brenna

## 2024-01-09 NOTE — Progress Notes (Signed)
 My back is worse.  She has more lower back pain.  She is getting new insurance and is waiting for her paperwork.  I would like to get a MRI when that is complete.  She has no weakness but numbness and paresthesias to the left side.  Lower back is tender, no spasm.  ROM decreased.  Muscle tone and strength normal, SLR positive at 30 on the left, she uses a cane.  Encounter Diagnosis  Name Primary?   Chronic midline low back pain with left-sided sciatica Yes   Try to get MRI when insurance changes.  I have reviewed the [place]  Controlled Substance Reporting System web site prior to prescribing narcotic medicine for this patient.  Call if any problem.  Precautions discussed.  Electronically Signed Lemond Stable, MD 7/30/20258:35 AM

## 2024-01-16 ENCOUNTER — Telehealth: Payer: Self-pay | Admitting: Orthopaedic Surgery

## 2024-01-16 MED ORDER — HYDROCODONE-ACETAMINOPHEN 5-325 MG PO TABS: ORAL_TABLET | ORAL | 0 refills | Status: DC

## 2024-01-16 NOTE — Telephone Encounter (Signed)
 Called pt no answer

## 2024-01-16 NOTE — Telephone Encounter (Signed)
 Dr. Sanjuan Dame pt - pt lvm requesting a refill on Hydrocodone 5-325 to be sent to [facility] on [address]

## 2024-01-23 ENCOUNTER — Telehealth: Payer: Self-pay | Admitting: Orthopaedic Surgery

## 2024-01-23 MED ORDER — HYDROCODONE-ACETAMINOPHEN 5-325 MG PO TABS: ORAL_TABLET | ORAL | 0 refills | Status: DC

## 2024-01-23 NOTE — Telephone Encounter (Signed)
Dr. Sanjuan Dame pt - pt lvm requesting a refill for Hydrocodone 5-325 to be sent to [facility] on [address]

## 2024-01-30 ENCOUNTER — Telehealth: Payer: Self-pay | Admitting: Orthopaedic Surgery

## 2024-01-30 MED ORDER — HYDROCODONE-ACETAMINOPHEN 5-325 MG PO TABS: ORAL_TABLET | ORAL | 0 refills | Status: DC

## 2024-01-30 NOTE — Telephone Encounter (Signed)
Dr. Sanjuan Dame pt - pt lvm requesting a refill for Hydrocodone 5-325 to be sent to [facility] on [address]

## 2024-02-01 ENCOUNTER — Encounter: Payer: Self-pay | Admitting: Radiology

## 2024-02-06 ENCOUNTER — Telehealth: Payer: Self-pay | Admitting: Orthopaedic Surgery

## 2024-02-06 NOTE — Telephone Encounter (Signed)
 Patient called back and she stated that her back is killing her and she [facility] will not schedule her bc they don't take [facility].  She stated that she has done everything she can to get her insurance change and they keep sending her [place].  She wants to know if there is anywhere else Dr. MARLA can send her that will take her insurance.

## 2024-02-06 NOTE — Telephone Encounter (Signed)
Dr. Sanjuan Dame pt - pt lvm requesting a refill for Hydrocodone 5-325 to be sent to [facility] on [address]

## 2024-02-07 MED ORDER — HYDROCODONE-ACETAMINOPHEN 5-325 MG PO TABS: ORAL_TABLET | ORAL | 0 refills | Status: DC

## 2024-02-07 NOTE — Telephone Encounter (Signed)
 Pt states her insurance will not cover her on a referral so she does not need a referral until she can change her insurance. Pt is coming in Sept 10th to see Dr. MARLA.

## 2024-02-14 ENCOUNTER — Telehealth: Payer: Self-pay | Admitting: Orthopaedic Surgery

## 2024-02-14 NOTE — Telephone Encounter (Signed)
 Dr. Janae pt - spoke w/the pt, she is requesting refill for Hydrocodone  5-325 to be sent to [facility] on [address]

## 2024-02-15 MED ORDER — HYDROCODONE-ACETAMINOPHEN 5-325 MG PO TABS: ORAL_TABLET | ORAL | 0 refills | Status: DC

## 2024-02-20 ENCOUNTER — Encounter: Payer: Self-pay | Admitting: Orthopaedic Surgery

## 2024-02-20 ENCOUNTER — Ambulatory Visit (INDEPENDENT_AMBULATORY_CARE_PROVIDER_SITE_OTHER): Admitting: Orthopaedic Surgery

## 2024-02-20 DIAGNOSIS — G8929 Other chronic pain: Secondary | ICD-10-CM

## 2024-02-20 DIAGNOSIS — M25562 Pain in left knee: Secondary | ICD-10-CM

## 2024-02-20 DIAGNOSIS — M5442 Lumbago with sciatica, left side: Secondary | ICD-10-CM

## 2024-02-20 MED ORDER — HYDROCODONE-ACETAMINOPHEN 5-325 MG PO TABS: ORAL_TABLET | ORAL | 0 refills | Status: DC

## 2024-02-20 NOTE — Progress Notes (Signed)
 I am the same.  She has chronic pain of the left knee.  She uses a cane.  She has lower back pain but that is stable.  She has no new trauma.  She has no giving way.  Left knee has crepitus, minimal effusion, ROM is good, stable, no distal edema, NV intact.  Lower back not tender today, ROM is good, muscle tone and strength normal.  Encounter Diagnoses  Name Primary?   Chronic pain of left knee Yes   Chronic midline low back pain with left-sided sciatica    I have informed the patient I will be retiring from medical practice and from this office on March 13, 2024.  The patient has been offered continuing care with Dr. Margrette or Dr. Onesimo of this office.  The patient may choose another provider and the records will be forwarded after proper signature and notification.  Patient understands and agrees.  She wants to see Dr. Margrette.  I have reviewed the [place]  Controlled Substance Reporting System web site prior to prescribing narcotic medicine for this patient.  Return in six weeks.  Call if any problem.  Precautions discussed.  Electronically Signed Lemond Stable, MD 9/10/20258:44 AM

## 2024-02-24 ENCOUNTER — Emergency Department (HOSPITAL_COMMUNITY): Admission: EM | Admit: 2024-02-24 | Discharge: 2024-02-24 | Discharge status: Against Medical Advice

## 2024-02-27 ENCOUNTER — Telehealth: Payer: Self-pay | Admitting: Orthopaedic Surgery

## 2024-02-27 MED ORDER — HYDROCODONE-ACETAMINOPHEN 5-325 MG PO TABS: ORAL_TABLET | ORAL | 0 refills | Status: DC

## 2024-02-27 NOTE — Telephone Encounter (Signed)
Dr. Sanjuan Dame pt - pt lvm requesting a refill for Hydrocodone 5-325 to be sent to [facility] on [address]

## 2024-03-05 ENCOUNTER — Telehealth: Payer: Self-pay | Admitting: Orthopaedic Surgery

## 2024-03-05 NOTE — Addendum Note (Signed)
 Addended by: MARCINE HUSBAND T on: 03/05/2024 03:36 PM   Modules accepted: Orders

## 2024-03-05 NOTE — Telephone Encounter (Signed)
 Dr. Sanjuan Dame pt - spoke w/the pt, she is requesting a refill for Hydrocodone 5-325 to be sent to [facility] on [address]

## 2024-03-05 NOTE — Telephone Encounter (Signed)
 Pt calling again about her refill.

## 2024-03-06 MED ORDER — HYDROCODONE-ACETAMINOPHEN 5-325 MG PO TABS: ORAL_TABLET | ORAL | 0 refills | Status: DC

## 2024-03-06 NOTE — Addendum Note (Signed)
 Addended by: BRENNA NORLEEN ORN on: 03/06/2024 08:26 AM   Modules accepted: Orders

## 2024-03-12 ENCOUNTER — Telehealth: Payer: Self-pay | Admitting: Orthopaedic Surgery

## 2024-03-12 NOTE — Telephone Encounter (Signed)
Dr. Sanjuan Cross pt - pt lvm requesting a refill for Hydrocodone 5-325 to be sent to [facility] on [address]

## 2024-03-13 MED ORDER — HYDROCODONE-ACETAMINOPHEN 5-325 MG PO TABS: ORAL_TABLET | ORAL | 0 refills | Status: DC

## 2024-03-19 ENCOUNTER — Other Ambulatory Visit: Payer: Self-pay | Admitting: Orthopedic Surgery

## 2024-03-19 ENCOUNTER — Telehealth: Payer: Self-pay | Admitting: Orthopedic Surgery

## 2024-03-19 MED ORDER — HYDROCODONE-ACETAMINOPHEN 5-325 MG PO TABS: ORAL_TABLET | ORAL | 0 refills | Status: DC

## 2024-03-19 NOTE — Telephone Encounter (Signed)
 Dr. Areatha pt, formerly Dr. Katherleen pt - pt lvm stating that her pain medication is due today, that it's due every Wednesday.  She wants this sent to [facility] on [facility], she stated she is in so much pain she doesn't know what to do.  Hydrocodone  5-325, 22 tablets, one tablet every six hours for pain.  She has an appointment scheduled for 04/02/24.

## 2024-03-26 ENCOUNTER — Telehealth: Payer: Self-pay | Admitting: Orthopedic Surgery

## 2024-03-26 ENCOUNTER — Other Ambulatory Visit: Payer: Self-pay | Admitting: Orthopedic Surgery

## 2024-03-26 DIAGNOSIS — G8929 Other chronic pain: Secondary | ICD-10-CM

## 2024-03-26 MED ORDER — HYDROCODONE-ACETAMINOPHEN 5-325 MG PO TABS: ORAL_TABLET | ORAL | 0 refills | Status: DC

## 2024-03-26 NOTE — Telephone Encounter (Signed)
 Dr. Areatha pt - pt lvm stating her pain medication is due today.  She is requesting a refill for Hydrocodone  5-325, 22 tablets, one tablet every six hours for pain.  She has an appointment scheduled for 04/02/24.

## 2024-04-02 ENCOUNTER — Encounter: Payer: Self-pay | Admitting: Orthopedic Surgery

## 2024-04-02 ENCOUNTER — Ambulatory Visit: Admitting: Orthopedic Surgery

## 2024-04-02 ENCOUNTER — Ambulatory Visit (INDEPENDENT_AMBULATORY_CARE_PROVIDER_SITE_OTHER): Admitting: Orthopedic Surgery

## 2024-04-02 ENCOUNTER — Other Ambulatory Visit (INDEPENDENT_AMBULATORY_CARE_PROVIDER_SITE_OTHER)

## 2024-04-02 DIAGNOSIS — M545 Low back pain, unspecified: Secondary | ICD-10-CM

## 2024-04-02 DIAGNOSIS — M542 Cervicalgia: Secondary | ICD-10-CM | POA: Diagnosis not present

## 2024-04-02 DIAGNOSIS — M79605 Pain in left leg: Secondary | ICD-10-CM

## 2024-04-02 DIAGNOSIS — M5442 Lumbago with sciatica, left side: Secondary | ICD-10-CM | POA: Diagnosis not present

## 2024-04-02 DIAGNOSIS — G8929 Other chronic pain: Secondary | ICD-10-CM

## 2024-04-02 DIAGNOSIS — M5410 Radiculopathy, site unspecified: Secondary | ICD-10-CM

## 2024-04-02 MED ORDER — HYDROCODONE-ACETAMINOPHEN 5-325 MG PO TABS: ORAL_TABLET | ORAL | 0 refills | Status: DC

## 2024-04-02 NOTE — Progress Notes (Signed)
    04/02/2024   Chief Complaint  Patient presents with   Neck Pain    Into left shoulder and left arm     No diagnosis found.  What pharmacy do you use ? __[facility] _________________________  DOI/DOS/ Date: ongoing   Worse

## 2024-04-02 NOTE — Patient Instructions (Addendum)
   Instructions regarding chronic pain management   You have chronic pain.  You are on chronic opioid therapy.  You will be referred to a chronic pain chronic opioid therapy specialist.  You have 30 days from today to continue getting your medications from this office.  After 30 days you would no longer get prescriptions for opioids from [facility].    [facility] [address]  [phone]  We will send referral there, you call next week to schedule, they will call you too.   We are referring you to [facility] from [facility] address is [address] [place] The phone number is [phone]  The office will call you with an appointment Dr. Georgina

## 2024-04-02 NOTE — Progress Notes (Signed)
   Chief Complaint  Patient presents with   Neck Pain    Into left shoulder and left arm    58 year old female former patient of Dr. Brenna comes in complaining of left leg radiculopathy and left arm radiculopathy with left-sided neck pain  She complains of weakness in her upper extremities  She complains of posterior knee pain.  She has had an MRI of the knee which showed no internal derangement.  Exam findings she is awake alert she is oriented x 3 she is a little emotionally labile.  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 2. No change in mild left foraminal narrowing at L3-4 and mild bilateral foraminal narrowing at L4-5.     Electronically Signed   By: Debby Prader M.D.   On: 10/16/2023 08:54  2017 cervical MRI was negative  Left shoulder x-ray was negative  X-ray C-spine today shows abnormal C7-T1 with what appears to be compression fracture which is probably old   The patient was sent to [facility] but her insurance was not excepted there with [facility]  We will make a spine Ortho care referral refill her [place] refer her to chronic pain management she is aware she has 30 days to get the pain management referral   Encounter Diagnoses  Name Primary?   Neck pain Yes   Chronic midline low back pain with left-sided sciatica    Lumbar pain with radiation down left leg    Radiculopathy of arm     Meds ordered this encounter  Medications   HYDROcodone -acetaminophen  (NORCO/VICODIN) 5-325 MG tablet    Sig: One tablet every six hours for pain.  Limit 5 days.    Dispense:  22 tablet    Refill:  0    May fill today

## 2024-04-09 ENCOUNTER — Other Ambulatory Visit: Payer: Self-pay | Admitting: Orthopedic Surgery

## 2024-04-09 ENCOUNTER — Telehealth: Payer: Self-pay | Admitting: Orthopedic Surgery

## 2024-04-09 DIAGNOSIS — G8929 Other chronic pain: Secondary | ICD-10-CM

## 2024-04-09 MED ORDER — HYDROCODONE-ACETAMINOPHEN 5-325 MG PO TABS
ORAL_TABLET | ORAL | 0 refills | Status: DC
Start: 2024-04-09 — End: 2024-04-16

## 2024-04-09 NOTE — Progress Notes (Signed)
 Last rx nov 15

## 2024-04-09 NOTE — Telephone Encounter (Signed)
 Dr. Areatha pt - pt lvm requesting a refill for Hydrocodone  5-325, 22 tablets, one tablet every six hours for pain.  Limit 5 days.

## 2024-04-14 ENCOUNTER — Ambulatory Visit: Admitting: Orthopedic Surgery

## 2024-04-14 ENCOUNTER — Encounter: Payer: Self-pay | Admitting: Radiology

## 2024-04-16 ENCOUNTER — Other Ambulatory Visit: Payer: Self-pay | Admitting: Orthopedic Surgery

## 2024-04-16 ENCOUNTER — Telehealth: Payer: Self-pay | Admitting: Orthopedic Surgery

## 2024-04-16 DIAGNOSIS — G8929 Other chronic pain: Secondary | ICD-10-CM

## 2024-04-16 MED ORDER — HYDROCODONE-ACETAMINOPHEN 5-325 MG PO TABS
ORAL_TABLET | ORAL | 0 refills | Status: DC
Start: 2024-04-16 — End: 2024-04-23

## 2024-04-16 NOTE — Progress Notes (Signed)
 Instructions regarding chronic pain management   You have chronic pain.  You are on chronic opioid therapy.  You will be referred to a chronic pain chronic opioid therapy specialist.  You have 30 days from today to continue getting your medications from this office.  After 30 days you would no longer get prescriptions for opioids from [facility].

## 2024-04-16 NOTE — Telephone Encounter (Signed)
 Dr. Areatha pt - pt lvm requesting a refill for Hydrocodone  5-325, 22 tablets, one tablet every six hours for pain.  Limit 5 days.

## 2024-04-18 ENCOUNTER — Other Ambulatory Visit: Payer: Self-pay | Admitting: Internal Medicine

## 2024-04-18 DIAGNOSIS — K219 Gastro-esophageal reflux disease without esophagitis: Secondary | ICD-10-CM

## 2024-04-23 ENCOUNTER — Other Ambulatory Visit: Payer: Self-pay | Admitting: Orthopedic Surgery

## 2024-04-23 ENCOUNTER — Telehealth: Payer: Self-pay | Admitting: Orthopedic Surgery

## 2024-04-23 DIAGNOSIS — G8929 Other chronic pain: Secondary | ICD-10-CM

## 2024-04-23 MED ORDER — HYDROCODONE-ACETAMINOPHEN 5-325 MG PO TABS: ORAL_TABLET | ORAL | 0 refills | Status: AC

## 2024-04-23 NOTE — Telephone Encounter (Signed)
 Dr. Areatha pt - pt lvm requesting a refill for Hydrocodone  5-325, 22 tablets, one tablet every six hours for pain.  Limit 5 day supply.  [facility] [address].

## 2024-04-23 NOTE — Progress Notes (Signed)
 Meds ordered this encounter  Medications   HYDROcodone -acetaminophen  (NORCO/VICODIN) 5-325 MG tablet    Sig: One tablet every six hours for pain.  Limit 5 days.    Dispense:  22 tablet    Refill:  0    May fill today    05/03/24  last rx   Instructions regarding chronic pain management   You have chronic pain.  You are on chronic opioid therapy.  You will be referred to a chronic pain chronic opioid therapy specialist.  You have 30 days from today to continue getting your medications from this office.  After 30 days you would no longer get prescriptions for opioids from [facility].

## 2024-04-30 ENCOUNTER — Telehealth: Payer: Self-pay | Admitting: Orthopedic Surgery

## 2024-04-30 NOTE — Telephone Encounter (Signed)
 Dr. Areatha pt - pt lvm requesting a refill for Hydrocodone  5-325, 22 tablets, one tablet every six hours for pain.  Limit 5 day supply.  [facility] [address].

## 2024-04-30 NOTE — Telephone Encounter (Signed)
 Called and lvm for the pt advising.

## 2024-05-01 ENCOUNTER — Telehealth: Payer: Self-pay

## 2024-05-01 NOTE — Telephone Encounter (Signed)
 Patient called and left message checking on her refill and asked for a return call. I called patient back and relayed the message that the last refill was her 30 day limit per Dr. Margrette. She was complaining that she hasn't heard anything from Dr. Jeraline office and I told her to give them a call and offered the number she hung up.

## 2024-05-05 ENCOUNTER — Telehealth: Payer: Self-pay | Admitting: Orthopedic Surgery

## 2024-05-05 NOTE — Telephone Encounter (Signed)
 Dr. Areatha pt - spoke w/the pt, she stated her whole left side is swollen.  She wanted an appointment.  Advised the next opening I have with Dr. VEAR and that I didn't have a provider in the office today.  She started fussing, saying that she'd go to the ED and that we've not done anything for her and stopped her medications that she's been getting every week.  She asked if she was just supposed to lay here and die.  I told her she could go to the ED today or [facility].  She stated she wasn't going to [facility] and hung up on me.

## 2024-05-09 ENCOUNTER — Telehealth: Payer: Self-pay | Admitting: Orthopedic Surgery

## 2024-05-09 NOTE — Telephone Encounter (Signed)
 Dr. Areatha pt - pt lvm this morning, I called her back, she stated she still hasn't heard anything from pain management.  Advised the office is closed, but I would send the message for her and someone from our office will call her on Monday.  [phone]

## 2024-05-12 ENCOUNTER — Other Ambulatory Visit: Payer: Self-pay

## 2024-05-12 ENCOUNTER — Encounter (HOSPITAL_COMMUNITY): Payer: Self-pay | Admitting: Emergency Medicine

## 2024-05-12 ENCOUNTER — Emergency Department (HOSPITAL_COMMUNITY): Admission: EM | Admit: 2024-05-12 | Discharge: 2024-05-12 | Disposition: A | Discharge status: Home / Self Care

## 2024-05-12 DIAGNOSIS — M5442 Lumbago with sciatica, left side: Secondary | ICD-10-CM | POA: Diagnosis not present

## 2024-05-12 DIAGNOSIS — G8929 Other chronic pain: Secondary | ICD-10-CM | POA: Diagnosis not present

## 2024-05-12 DIAGNOSIS — M545 Low back pain, unspecified: Secondary | ICD-10-CM | POA: Diagnosis present

## 2024-05-12 MED ORDER — OXYCODONE-ACETAMINOPHEN 5-325 MG PO TABS
1.0000 | ORAL_TABLET | Freq: Once | ORAL | Status: AC
  Administered 2024-05-12: 1 via ORAL
  Filled 2024-05-12: qty 1

## 2024-05-12 MED ORDER — LIDOCAINE 5 % EX PTCH
1.0000 | MEDICATED_PATCH | CUTANEOUS | Status: DC
  Administered 2024-05-12: 1 via TRANSDERMAL
  Filled 2024-05-12: qty 1

## 2024-05-12 MED ORDER — OXYCODONE HCL 5 MG PO TABS: 5.0000 mg | ORAL_TABLET | ORAL | 0 refills | Status: AC | PRN

## 2024-05-12 MED ORDER — LIDOCAINE 5 % EX PTCH: 1.0000 | MEDICATED_PATCH | CUTANEOUS | 0 refills | Status: AC

## 2024-05-12 MED ORDER — KETOROLAC TROMETHAMINE 15 MG/ML IJ SOLN
15.0000 mg | Freq: Once | INTRAMUSCULAR | Status: AC
  Administered 2024-05-12: 15 mg via INTRAMUSCULAR
  Filled 2024-05-12: qty 1

## 2024-05-12 NOTE — Telephone Encounter (Signed)
 I couldn't get through on the phone Maybe we can mail it

## 2024-05-12 NOTE — ED Triage Notes (Signed)
 Pt bib pov w/ c/o left leg pain. Pt reports pain starts at her left lower back and goes down to left ankle. Pt reports pain as constant and 8/10. Pain has been going on for a minute Pt reports she could not get in to see her dr today. Pt reports she has been told she has sciatica.

## 2024-05-12 NOTE — ED Provider Notes (Signed)
 [place] EMERGENCY DEPARTMENT AT [facility] Provider Note   CSN: [id number] Arrival date & time: 05/12/24  9189     Patient presents with: Knee Pain and Back Pain   Judy Cross is a 58 y.o. female.    Knee Pain Associated symptoms: back pain   Back Pain     History of low back pain.  Left lower back pain.  She has had this chronically.  Worse today than usual.  Has no medication at home to take.  Has not tried any medication.  Has followed up with spine surgery in the past.  No fever no chills.  No IV drug abuse.  No numbness or tingling of the groin area.  No saddle anesthesia.  No bowel bladder incontinence.  Endorsing pain that shoots down her left buttocks into the back of her left leg.  Able to ambulate.SABRA  otherwise , denies all complaints.   Previous medical history reviewed : With orthopedic surgery.  Neck pain into left shoulder and left arm.  Radiculopathy. Refer  to pain management.  Right L-spine back in April 2025.  Left for aminal protrusion at L5-S1 impinges on the exiting left L5 root.    Prior to Admission medications   Medication Sig Start Date End Date Taking? Authorizing Provider  lidocaine  (LIDODERM ) 5 % Place 1 patch onto the skin daily. Remove & Discard patch within 12 hours or as directed by MD 05/12/24  Yes Simon Lavonia SAILOR, MD  oxyCODONE  (ROXICODONE ) 5 MG immediate release tablet Take 1 tablet (5 mg total) by mouth every 4 (four) hours as needed for up to 5 doses for severe pain (pain score 7-10). 05/12/24  Yes Simon Lavonia SAILOR, MD  Acetaminophen  (TYLENOL  PO) Take by mouth.    [provider]  hydrochlorothiazide  (HYDRODIURIL ) 25 MG tablet TAKE 1 TABLET(25 MG) BY MOUTH DAILY 09/12/23   Patel, Rutwik K, MD  HYDROcodone -acetaminophen  (NORCO/VICODIN) 5-325 MG tablet One tablet every six hours for pain.  Limit 5 days. 04/23/24   Margrette Taft BRAVO, MD  levETIRAcetam  (KEPPRA ) 750 MG tablet Take 1 tablet (750 mg total) by mouth 2 (two) times  daily. 07/11/22   Camara, Amadou, MD  levocetirizine (XYZAL ) 5 MG tablet Take 5 mg by mouth daily. 10/05/22   [provider]  lidocaine  (LIDODERM ) 5 % Place 1 patch onto the skin daily. Remove & Discard patch within 12 hours or as directed by MD 07/29/23   Suellen Cantor A, PA-C  promethazine  (PHENERGAN ) 25 MG tablet Take 1 tablet (25 mg total) by mouth every 6 (six) hours as needed for nausea or vomiting. 12/07/23   Signa Delon LABOR, NP  sertraline  (ZOLOFT ) 50 MG tablet Take 1 tablet (50 mg total) by mouth daily. 07/27/22   Tobie Suzzane POUR, MD    Allergies: Lisinopril, Penicillins, and Tramadol     Review of Systems  Musculoskeletal:  Positive for back pain.    Updated Vital Signs BP (!) 161/100 (BP Location: Right Arm)   Pulse 64   Temp 98.3 F (36.8 C) (Oral)   Resp 17   Ht 5' 1.5 (1.562 m)   Wt 57 kg   SpO2 100%   BMI 23.36 kg/m   Physical Exam Vitals and nursing note reviewed.  Constitutional:      General: She is not in acute distress.    Appearance: She is well-developed.  HENT:     Head: Normocephalic and atraumatic.  Eyes:     Conjunctiva/sclera: Conjunctivae normal.  Cardiovascular:     Rate and Rhythm: Normal rate and regular rhythm.     Heart sounds: No murmur heard. Pulmonary:     Effort: Pulmonary effort is normal. No respiratory distress.     Breath sounds: Normal breath sounds.  Abdominal:     Palpations: Abdomen is soft.     Tenderness: There is no abdominal tenderness.  Musculoskeletal:        General: No swelling.       Arms:     Cervical back: Neck supple.  Skin:    General: Skin is warm and dry.     Capillary Refill: Capillary refill takes less than 2 seconds.  Neurological:     Mental Status: She is alert.  Psychiatric:        Mood and Affect: Mood normal.     (all labs ordered are listed, but only abnormal results are displayed) Labs Reviewed - No data to display  EKG: None  Radiology: No results found.   Procedures    Medications Ordered in the ED  lidocaine  (LIDODERM ) 5 % 1 patch (1 patch Transdermal Patch Applied 05/12/24 0901)  oxyCODONE -acetaminophen  (PERCOCET/ROXICET) 5-325 MG per tablet 1 tablet (1 tablet Oral Given 05/12/24 0902)  ketorolac  (TORADOL ) 15 MG/ML injection 15 mg (15 mg Intramuscular Given 05/12/24 0901)                                    Medical Decision Making Risk Prescription drug management.     HPI:   History of low back pain.  Left lower back pain.  She has had this chronically.  Worse today than usual.  Has no medication at home to take.  Has not tried any medication.  Has followed up with spine surgery in the past.  No fever no chills.  No IV drug abuse.  No numbness or tingling of the groin area.  No saddle anesthesia.  No bowel bladder incontinence.  Endorsing pain that shoots down her left buttocks into the back of her left leg.  Able to ambulate.SABRA  otherwise , denies all complaints.   Previous medical history reviewed : With orthopedic surgery.  Neck pain into left shoulder and left arm.  Radiculopathy. Refer  to pain management.  Right L-spine back in April 2025.  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  MDM:   Palpation left lower back.  Consistent with previous diagnosis of sciatica.  Reviewed patient's prior MRI.  Consistent for likely L5 dermatome area.  Able to ambulate.  No bowel bladder incontinence.  No saddle anesthesia.  No red flag symptoms.  No IV drug abuse.  No concerns for any kind of spinal abscess.  No cauda equina signs or symptoms.  Strength and sensation intact in bilateral lower extremities.  Recommended Tylenol  and epinephrine  for pain control.  Called and oxycodone  5 mg x 5 doses but explained that can only prescribe a very minimal amount of oxy from the ED and needs to follow-up with spine surgery if pain continues as well as pain management.  Deafly needs physical therapy as well.   Patient to be discharged  stable condition.    Upon reexamination, patient hemodynamically stable.  Remains A&O x 3 with GCS 15.   Social Determinant of Health: no iv drug abuse          Final diagnoses:  Chronic left-sided low back pain with  left-sided sciatica    ED Discharge Orders          Ordered    oxyCODONE  (ROXICODONE ) 5 MG immediate release tablet  Every 4 hours PRN        05/12/24 0852    lidocaine  (LIDODERM ) 5 %  Every 24 hours        05/12/24 0855               Simon Lavonia SAILOR, MD 05/12/24 [phone]

## 2024-05-12 NOTE — Discharge Instructions (Addendum)
 For pain, you can take 1000 mg of Tylenol  or 1 g of Tylenol  every 6-8 hours.  Do not exceed more than 4000 mg or 4 g in a 24-hour period.  You can also take ibuprofen  600 to 800 mg every 6-8 hours as well.  Do not take this high-dose ibuprofen  for greater than a week.  Apply the lidocaine  patches.   You can take the oxy for break through pain. Do not take and drive or with alcohol.   If you have groin numbness and/or bowel/bladder incontinence then come back to the ED immediately.   Follow up  spine surgery.  I gave you the name and number. Make an appointment.   You need to follow-up with physical therapy as well.

## 2024-05-12 NOTE — ED Notes (Signed)
 MD notified of pt BP prior to DC. MD advised pt. MD verbally verified pt is to DC at this time.

## 2024-05-12 NOTE — Telephone Encounter (Signed)
 Her phone is disconnected I will print and mail to her. Going forward if its pain managment\ can you tell them the information in the AVS ?   If you need it without looking it up its [facility] at [facility] [address]  [phone]  We will send referral there, you call next week to schedule, they will call you too.

## 2024-05-21 ENCOUNTER — Telehealth: Payer: Self-pay | Admitting: Orthopedic Surgery

## 2024-05-21 NOTE — Telephone Encounter (Signed)
 Dr. Areatha pt - spoke w/the pt who is extremely rude.  She asked if the doctor was back in.  She requested an appointment for her knee, scheduled her for tomorrow.  She then said it's been 30 days and she wants a refill on her medication while she waits for her appointment tomorrow.  Hydrocodone  5-325, 22 tablets, one tablet every six hours for pain to be sent to [facility] on [facility].  I also see she got Oxycodone  5mg  5 tablets on 05/12/24 prescribed by Dr. Lavonia Pat.

## 2024-05-22 ENCOUNTER — Ambulatory Visit: Admitting: Orthopedic Surgery

## 2024-05-22 ENCOUNTER — Encounter: Payer: Self-pay | Admitting: Orthopedic Surgery

## 2024-05-22 DIAGNOSIS — M5442 Lumbago with sciatica, left side: Secondary | ICD-10-CM

## 2024-05-22 DIAGNOSIS — G8929 Other chronic pain: Secondary | ICD-10-CM

## 2024-05-22 NOTE — Progress Notes (Signed)
° ° ° °  05/22/2024   Chief Complaint  Patient presents with   Knee Pain    Left    lumbar pain    Encounter Diagnosis  Name Primary?   Chronic midline low back pain with left-sided sciatica Yes    Chronic pain lower back left leg radiating to left knee  Recently seen in the emergency room  Patient also on chronic opioid therapy Dr. Brenna her 30-day window of getting medicine from us  has expired  The patient has been upset about not being able to get her pain medication and she has several transportation issues She also switches from [facility] to [facility] in January.  She could not get an appointment at [facility]  We are going to rerefer her to Dr. Georgina to handle her back issue and then try to get her to see a pain management specialist regarding her opioids  I talked to her about her behavior on the telephone and hopefully she will be much more pleasant going forward   CLINICAL DATA:  Chronic low back pain radiating into the left leg.   EXAM: MRI LUMBAR SPINE WITHOUT CONTRAST   TECHNIQUE: Multiplanar, multisequence MR imaging of the lumbar spine was performed. No intravenous contrast was administered.   COMPARISON:  MRI lumbar spine 08/24/2022. Plain films lumbar spine 07/29/2023.   FINDINGS: Segmentation:  Standard.   Alignment:  Normal.   Vertebrae:  No fracture, evidence of discitis, or bone lesion.   Conus medullaris and cauda equina: Conus extends to the L1 level. Conus and cauda equina appear normal.   Paraspinal and other soft tissues: Small right renal cyst is unchanged. Otherwise negative.   Disc levels:   T11-12 is imaged in the sagittal plane only and negative.   T12-L1: Negative.   L1-2: Negative.   L2-3: Negative.   L3-4: There is a shallow disc bulge and moderate facet arthropathy, worse on the right. Mild left foraminal narrowing is unchanged. The central canal and right foramen are open. No change.   L4-5: Shallow disc bulge and  mild to moderate facet degenerative disease. The central canal is open. Mild foraminal narrowing is more notable on the left. No change.   L5-S1: There is a shallow disc bulge and superimposed protrusion in the far left foramen impinging on the exiting left L5 root. The appearance is worse than on the prior exam. Mild right foraminal stenosis is unchanged. The central canal is open.   IMPRESSION: 1. 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 2. No change in mild left foraminal narrowing at L3-4 and mild bilateral foraminal narrowing at L4-5.     Electronically Signed   By: Debby Prader M.D.   On: 10/16/2023 08:54

## 2024-05-22 NOTE — Progress Notes (Signed)
° ° °  05/22/2024   Chief Complaint  Patient presents with   Knee Pain    Left    lumbar pain    No diagnosis found.  What pharmacy do you use ? __[facility]_________  DOI/DOS/ Date:    Did you get better, worse or no change (Answer below)   Unchanged

## 2024-05-22 NOTE — Patient Instructions (Addendum)
 Spine surgeon Dr Georgina We are referring you to Maralee Morita from [facility] address is [address] [place] The phone number is [phone]  Dr.  Georgina  Pain management [facility] [address]  [phone]  They have your referral, tell them you will not have [facility] next year, and make appointment for January

## 2024-05-23 ENCOUNTER — Telehealth: Payer: Self-pay | Admitting: Orthopedic Surgery

## 2024-05-23 NOTE — Telephone Encounter (Signed)
 Dr. Areatha pt - spoke w/the pt, she stated that she was supposed to call back and let Dr. Margrette know when she scheduled with Dr. Georgina.  She stated she has an appointment on 07/10/23 at 8:45am and she stated that she will need help w/transportation.  Sari, can you help her w/this for [facility]?

## 2024-05-30 ENCOUNTER — Emergency Department (HOSPITAL_COMMUNITY)
Admission: EM | Admit: 2024-05-30 | Discharge: 2024-05-30 | Disposition: A | Discharge status: Home / Self Care | Attending: Emergency Medicine | Admitting: Emergency Medicine

## 2024-05-30 ENCOUNTER — Other Ambulatory Visit: Payer: Self-pay

## 2024-05-30 ENCOUNTER — Encounter (HOSPITAL_COMMUNITY): Payer: Self-pay

## 2024-05-30 ENCOUNTER — Emergency Department (HOSPITAL_COMMUNITY)

## 2024-05-30 DIAGNOSIS — I10 Essential (primary) hypertension: Secondary | ICD-10-CM | POA: Diagnosis not present

## 2024-05-30 DIAGNOSIS — M545 Low back pain, unspecified: Secondary | ICD-10-CM | POA: Diagnosis present

## 2024-05-30 DIAGNOSIS — G8929 Other chronic pain: Secondary | ICD-10-CM | POA: Diagnosis not present

## 2024-05-30 DIAGNOSIS — M5442 Lumbago with sciatica, left side: Secondary | ICD-10-CM | POA: Insufficient documentation

## 2024-05-30 MED ORDER — KETOROLAC TROMETHAMINE 15 MG/ML IJ SOLN
15.0000 mg | Freq: Once | INTRAMUSCULAR | Status: AC
  Administered 2024-05-30: 15 mg via INTRAMUSCULAR
  Filled 2024-05-30: qty 1

## 2024-05-30 MED ORDER — ACETAMINOPHEN 500 MG PO TABS
1000.0000 mg | ORAL_TABLET | Freq: Once | ORAL | Status: AC
  Administered 2024-05-30: 1000 mg via ORAL
  Filled 2024-05-30: qty 2

## 2024-05-30 MED ORDER — NAPROXEN 500 MG PO TABS: 500.0000 mg | ORAL_TABLET | Freq: Two times a day (BID) | ORAL | 0 refills | Status: AC

## 2024-05-30 MED ORDER — PREDNISONE 20 MG PO TABS: 40.0000 mg | ORAL_TABLET | Freq: Every day | ORAL | 0 refills | Status: AC

## 2024-05-30 MED ORDER — LORAZEPAM 0.5 MG PO TABS
0.5000 mg | ORAL_TABLET | ORAL | Status: DC | PRN
  Administered 2024-05-30: 0.5 mg via ORAL
  Filled 2024-05-30: qty 1

## 2024-05-30 MED ORDER — OXYCODONE HCL 5 MG PO TABS
5.0000 mg | ORAL_TABLET | Freq: Once | ORAL | Status: AC
  Administered 2024-05-30: 5 mg via ORAL
  Filled 2024-05-30: qty 1

## 2024-05-30 MED ORDER — METHOCARBAMOL 500 MG PO TABS
500.0000 mg | ORAL_TABLET | Freq: Once | ORAL | Status: AC
  Administered 2024-05-30: 500 mg via ORAL
  Filled 2024-05-30: qty 1

## 2024-05-30 MED ORDER — LIDOCAINE 5 % EX PTCH
2.0000 | MEDICATED_PATCH | Freq: Once | CUTANEOUS | Status: DC
  Administered 2024-05-30: 2 via TRANSDERMAL
  Filled 2024-05-30: qty 2

## 2024-05-30 MED ORDER — METHYLPREDNISOLONE SODIUM SUCC 40 MG IJ SOLR
40.0000 mg | Freq: Once | INTRAMUSCULAR | Status: AC
  Administered 2024-05-30: 40 mg via INTRAMUSCULAR
  Filled 2024-05-30: qty 1

## 2024-05-30 MED ORDER — CYCLOBENZAPRINE HCL 10 MG PO TABS: 10.0000 mg | ORAL_TABLET | Freq: Two times a day (BID) | ORAL | 0 refills | Status: AC | PRN

## 2024-05-30 MED ORDER — LORAZEPAM 2 MG/ML IJ SOLN: 0.5000 mg | INTRAMUSCULAR | Status: DC | PRN

## 2024-05-30 NOTE — ED Provider Notes (Signed)
 " [facility] AT [facility] Provider Note   CSN: [id number] Arrival date & time: 05/30/24  9044     Patient presents with: Back Pain   Judy Cross is a 58 y.o. female.  Patient is a 58 year old female with a history of hypertension, seizure disorder, and chronic midline low back pain with sciatica who presents to the ED for increasing back pain radiating down the left leg for the past 2 days.  Notes she has had severe pain for several weeks but pain recently increased 2 days ago.  Denies new fall or injury.  States she has been taking Tylenol  and ibuprofen  with minimal relief.  Notes the pain is most severe in the left groin and she has had some numbness/tingling.  Denies urinary or bowel incontinence.  States she is scheduled to see a back doctor at the end of January as well as a pain doctor but has not seen a pain doctor in over a year.  Patient notes she is on a pain contract still currently.  Denies syncope, chest pain, shortness of breath, urinary symptoms.  No further complaints.      Back Pain Associated symptoms: no chest pain, no dysuria and no fever        Prior to Admission medications  Medication Sig Start Date End Date Taking? Authorizing Provider  cyclobenzaprine  (FLEXERIL ) 10 MG tablet Take 1 tablet (10 mg total) by mouth 2 (two) times daily as needed for muscle spasms. 05/30/24  Yes Alano Blasco, Thersia RAMAN, PA-C  naproxen  (NAPROSYN ) 500 MG tablet Take 1 tablet (500 mg total) by mouth 2 (two) times daily. 05/30/24  Yes Gionna Polak, Thersia RAMAN, PA-C  predniSONE  (DELTASONE ) 20 MG tablet Take 2 tablets (40 mg total) by mouth daily for 5 days. 05/30/24 06/04/24 Yes Jett Kulzer, Thersia RAMAN, PA-C  Acetaminophen  (TYLENOL  PO) Take by mouth.    [provider]  hydrochlorothiazide  (HYDRODIURIL ) 25 MG tablet TAKE 1 TABLET(25 MG) BY MOUTH DAILY 09/12/23   Patel, Rutwik K, MD  HYDROcodone -acetaminophen  (NORCO/VICODIN) 5-325 MG tablet One tablet every six hours for  pain.  Limit 5 days. 04/23/24   Margrette Taft BRAVO, MD  levETIRAcetam  (KEPPRA ) 750 MG tablet Take 1 tablet (750 mg total) by mouth 2 (two) times daily. 07/11/22   Camara, Amadou, MD  levocetirizine (XYZAL ) 5 MG tablet Take 5 mg by mouth daily. 10/05/22   [provider]  lidocaine  (LIDODERM ) 5 % Place 1 patch onto the skin daily. Remove & Discard patch within 12 hours or as directed by MD 07/29/23   Suellen Cantor A, PA-C  lidocaine  (LIDODERM ) 5 % Place 1 patch onto the skin daily. Remove & Discard patch within 12 hours or as directed by MD 05/12/24   Simon Lavonia SAILOR, MD  oxyCODONE  (ROXICODONE ) 5 MG immediate release tablet Take 1 tablet (5 mg total) by mouth every 4 (four) hours as needed for up to 5 doses for severe pain (pain score 7-10). 05/12/24   Simon Lavonia SAILOR, MD  promethazine  (PHENERGAN ) 25 MG tablet Take 1 tablet (25 mg total) by mouth every 6 (six) hours as needed for nausea or vomiting. 12/07/23   Signa Delon LABOR, NP  sertraline  (ZOLOFT ) 50 MG tablet Take 1 tablet (50 mg total) by mouth daily. 07/27/22   Tobie Suzzane POUR, MD    Allergies: Lisinopril, Penicillins, and Tramadol     Review of Systems  Constitutional:  Negative for fever.  Respiratory:  Negative for shortness of breath.   Cardiovascular:  Negative for chest pain.  Genitourinary:  Negative for dysuria.  Musculoskeletal:  Positive for back pain.    Updated Vital Signs BP (!) 158/105   Pulse 60   Temp 97.9 F (36.6 C) (Oral)   Resp 18   Ht 5' 1.5 (1.562 m)   Wt 57 kg   SpO2 98%   BMI 23.36 kg/m   Physical Exam Constitutional:      Appearance: Normal appearance.  HENT:     Head: Normocephalic and atraumatic.     Mouth/Throat:     Mouth: Mucous membranes are moist.     Pharynx: Oropharynx is clear.  Cardiovascular:     Rate and Rhythm: Normal rate.  Pulmonary:     Effort: Pulmonary effort is normal.  Musculoskeletal:     Comments: Full range of motion of bilateral lower extremities with equal  strength on dorsiflexion/plantarflexion.  Tender palpation on the left lower lumbar area as well as left lateral hip.  Also tender in the left groin.  No obvious deformities, erythema, edema.  PT pulses 2+ bilaterally  Skin:    General: Skin is warm and dry.  Neurological:     Mental Status: She is alert and oriented to person, place, and time.  Psychiatric:     Comments: Very anxious     (all labs ordered are listed, but only abnormal results are displayed) Labs Reviewed - No data to display  EKG: None  Radiology: MR LUMBAR SPINE WO CONTRAST Result Date: 05/30/2024 EXAM: MRI LUMBAR SPINE 05/30/2024 01:01:30 PM TECHNIQUE: Multiplanar multisequence MRI of the lumbar spine was performed without the administration of intravenous contrast. COMPARISON: MRI lumbar spine 10/02/2023. CLINICAL HISTORY: Low back pain, cauda equina syndrome suspected. Left lower back pain radiating to the left groin and knee. FINDINGS: BONES AND ALIGNMENT: 5 lumbar type vertebrae. Normal alignment. No fracture or suspicious marrow lesion. Increased, mild to moderate right facet edema at L3-L4. SPINAL CORD: The conus medullaris terminates at L1 and is normal in signal. SOFT TISSUES: No paraspinal mass. DISC LEVELS: T12-L1: Negative. L1-L2: Negative. L2-L3: Mild facet hypertrophy without disc herniation or stenosis, unchanged. L3-L4: Left eccentric disc bulging and moderate facet hypertrophy result in mild left neural foraminal stenosis without spinal stenosis, unchanged. L4-L5: Disc desiccation and mild to moderate disc space narrowing. Disc bulging and moderate facet hypertrophy result in mild bilateral lateral recess stenosis and mild bilateral neural foraminal stenosis without spinal stenosis, unchanged. L5-S1: Disc desiccation and moderate disc space narrowing. Disc bulging, a left foraminal disc protrusion, endplate spurring, and mild to moderate facet hypertrophy result in mild right and moderate to severe left neural  foraminal stenosis with potential left L5 nerve root impingement, unchanged. No spinal stenosis. IMPRESSION: 1. Increased degenerative facet edema on the right at L3-L4. 2. Otherwise unchanged multilevel disc and facet degeneration without significant spinal stenosis. 3. Moderate to severe left neural foraminal stenosis at L5-S1. Electronically signed by: Dasie Hamburg MD 05/30/2024 01:21 PM EST RP Workstation: [id number]     Medications Ordered in the ED  LORazepam (ATIVAN) tablet 0.5 mg (0.5 mg Oral Given 05/30/24 1202)  lidocaine  (LIDODERM ) 5 % 2 patch (2 patches Transdermal Patch Applied 05/30/24 1207)  ketorolac  (TORADOL ) 15 MG/ML injection 15 mg (15 mg Intramuscular Given 05/30/24 1205)  methocarbamol  (ROBAXIN ) tablet 500 mg (500 mg Oral Given 05/30/24 1202)  acetaminophen  (TYLENOL ) tablet 1,000 mg (1,000 mg Oral Given 05/30/24 1202)  oxyCODONE  (Oxy IR/ROXICODONE ) immediate release tablet 5 mg (5 mg Oral Given 05/30/24 1202)  methylPREDNISolone   sodium succinate (SOLU-MEDROL ) 40 mg/mL injection 40 mg (40 mg Intramuscular Given 05/30/24 1207)                                   Medical Decision Making Patient is a 58 year old female with a history of hypertension, seizure disorder, and chronic back pain who presents to the ED for increasing back pain radiating down the left leg for the past 2 days.  Denies fall or injury.  Please see detailed HPI above.  On exam patient is alert and comfortable lying on the bed.  She is very anxious.  Physical exam as noted above.  No acute neurologic deficits noted and neurovascularly intact.  Differential includes acute on chronic back pain, sciatica, degenerative disc disease, musculoskeletal pain, spinal epidural abscess, cauda equina.  Patient does report numbness/tingling in the groin area so MRI will be obtained to further evaluate concerns for cauda equina.  MRI of the lumbar spine is obtained that shows increased degenerative facet edema on the right at  L3-L4 otherwise there is unchanged multilevel degenerative disc disease without significant spinal stenosis.  Much less concerns for cauda equina with negative imaging.  Patient was given Toradol , Robaxin , oxycodone , Tylenol , Solu-Medrol , and lidocaine  patch while in ED with significant improvement in pain.  Notes she is feeling much better and can walk much better at this time with pain improved.  Suspect patient's pain secondary to acute on chronic pain.  No concerns for further emergent workup today.  Stable for discharge home.  Prescribed Flexeril , naproxen , and prednisone .  Symptomatic care discussed.  Advised to follow-up with pain doctor PCP for further pain medications as needed.  Advised to follow-up with spine doctor as scheduled next month.  Return precautions provided for worsening symptoms.  Amount and/or Complexity of Data Reviewed Radiology: ordered.  Risk OTC drugs. Prescription drug management.      Final diagnoses:  Chronic left-sided low back pain with left-sided sciatica    ED Discharge Orders          Ordered    cyclobenzaprine  (FLEXERIL ) 10 MG tablet  2 times daily PRN        05/30/24 1339    naproxen  (NAPROSYN ) 500 MG tablet  2 times daily        05/30/24 1339    predniSONE  (DELTASONE ) 20 MG tablet  Daily        05/30/24 1339               Neysa Thersia RAMAN, [place] 05/30/24 1428    Suzette Pac, MD 05/30/24 1713  "

## 2024-05-30 NOTE — Discharge Instructions (Signed)
 May take naproxen  twice a day as needed for pain.  Do not take extra ibuprofen  at the same time.  Begin taking prednisone  as prescribed.  May take Flexeril  twice a day as needed for pain/muscle spasms.  May wear over-the-counter lidocaine  patches on the back/hip for up to 12 hours at a time.  Heat pads can help as well.  Please follow-up with back doctor as scheduled next month.  May follow-up with primary care doctor or pain doctor in the meantime for any continued pain medications.  Return to ED if any symptoms worsen including inability to walk, incontinence, numbness/tingling in the groin.

## 2024-05-30 NOTE — ED Triage Notes (Signed)
 Pt is having left lower back pain. Pain radiates down hip to knee. Pt states her left groin is hurting as well. Pt was told if the pain worsen to be seen by someone. A&Ox4.

## 2024-06-17 ENCOUNTER — Telehealth: Payer: Self-pay | Admitting: Radiology

## 2024-06-17 NOTE — Telephone Encounter (Signed)
 I scheduled transportation for patient w/ [employer], dispatch ph # [phone].  They will pick patient up at home, bring her to [facility] [address], then take her back home after appt.  Please call patient and advise? Thanks!

## 2024-07-09 ENCOUNTER — Other Ambulatory Visit: Payer: Self-pay

## 2024-07-09 ENCOUNTER — Ambulatory Visit: Admitting: Orthopedic Surgery

## 2024-07-09 VITALS — BP 164/115 | HR 54 | Ht 65.0 in | Wt 126.0 lb

## 2024-07-09 DIAGNOSIS — M542 Cervicalgia: Secondary | ICD-10-CM | POA: Diagnosis not present

## 2024-07-09 DIAGNOSIS — M545 Low back pain, unspecified: Secondary | ICD-10-CM

## 2024-07-09 DIAGNOSIS — M5416 Radiculopathy, lumbar region: Secondary | ICD-10-CM

## 2024-07-09 NOTE — Progress Notes (Signed)
 Orthopedic Spine Surgery Office Note  Assessment: Patient is a 59 y.o. female with chronic neck and low back pain. Has some numbness/paresthesias over the lateral leg and foot that may be radicular in nature. The left knee pain is isolated to the knee and seems less likely radicular   Plan: -Explained that initially conservative treatment is tried as a significant number of patients may experience relief with these treatment modalities. Discussed that the conservative treatments include:  -activity modification  -physical therapy  -over the counter pain medications  -medrol  dosepak  -lumbar steroid injections -Patient has tried PT, tylenol , aleve , oral steroids, intramuscular steroid injection  -Recommended diagnostic/therapeutic transforaminal injection. Referral provided to her today -Patient should return to office in 6 weeks, x-rays at next visit: none   Patient expressed understanding of the plan and all questions were answered to the patient's satisfaction.   ___________________________________________________________________________   History:  Patient is a 59 y.o. female who presents today for lumbar spine. Patient has had pain for several years now in her back and neck. Her pain is worse in her back. She notes the pain on a daily basis. She also reports left knee pain. There is no adjacent thigh or leg pain. It is just in the knee. She has no right lower extremity pain. She also has neck pain. No radiating arm pain. Pain is worse with activity and improves with rest.    Weakness: denies Symptoms of imbalance: denies  Paresthesias and numbness: denies Bowel or bladder incontinence: denies  Saddle anesthesia: denies  Treatments tried: PT, tylenol , aleve , oral steroids, intramuscular steroid injection   Review of systems: Denies fevers and chills, night sweats, unexplained weight loss, history of cancer. Has had pain that wakes her at night  Past medical  history: HTN Depression/anxiety GERD  Allergies: lisinopril, penicillin  Past surgical history:  C section Polypectomy Ganglion cyst excision Trigger finger release  Social history: Denies use of nicotine product (smoking, vaping, patches, smokeless) Alcohol use: denies Denies recreational drug use   Physical Exam:  BMI of 21.0  General: no acute distress, appears stated age Neurologic: alert, answering questions appropriately, following commands Respiratory: unlabored breathing on room air, symmetric chest rise Psychiatric: appropriate affect, normal cadence to speech   MSK (spine):  -Strength exam      Left  Right Grip strength                5/5  5/5 Interosseus   5/5   5/5 Wrist extension  5/5  5/5 Wrist flexion   5/5  5/5 Elbow flexion   5/5  5/5 Deltoid    5/5  5/5  EHL    5/5  5/5 TA    5/5  5/5 GSC    5/5  5/5 Knee extension  5/5  5/5 Hip flexion   5/5  5/5  -Sensory exam    Sensation intact to light touch in L3-S1 nerve distributions of bilateral lower extremities  Sensation intact to light touch in C5-T1 nerve distributions bilaterally  -Brachioradialis DTR: 1/4 on the left, 1/4 on the right -Biceps DTR: 1/4 on the left, 1/4 on the right -Achilles DTR: 1/4 on the left, 1/4 on the right -Patellar tendon DTR: 1/4 on the left, 1/4 on the right  -Straight leg raise: negative bilaterally -Clonus: no beats bilaterally  -Left hip exam: no pain through range of motion -Right hip exam: no pain through range of motion  Imaging: XRs of the cervical spine from 07/09/2024 were independently reviewed  and interpreted, showing no significant disc height loss or degenerative changes within the cervical spine. No evidence of instability on flexion/extension views. No fracture or dislocation seen.   XRs of the lumbar spine from 07/09/2024 were independently reviewed and interpreted, showing disc height loss at L5/S1. No other significant degenerative changes  seen. No evidence of instability on flexion/extension views. No fracture or dislocation seen.   MRI of the lumbar spine from 05/30/2024 was independently reviewed and interpreted, showing bilateral foraminal stenosis at L5/S1. No other significant stenosis seen.    Patient name: Judy Cross Patient MRN: [id number] Date of visit: 07/09/24

## 2024-07-29 ENCOUNTER — Encounter: Admitting: Physical Medicine and Rehabilitation

## 2024-08-25 ENCOUNTER — Ambulatory Visit: Admitting: Orthopedic Surgery
